# Patient Record
Sex: Female | Born: 1980 | Race: Black or African American | Hispanic: No | Marital: Single | Smoking: Never smoker
Health system: Southern US, Community
[De-identification: ages and names within clinical notes are randomized; demographics above are authoritative.]

## PROBLEM LIST (undated history)

## (undated) DIAGNOSIS — G500 Trigeminal neuralgia: Secondary | ICD-10-CM

## (undated) DIAGNOSIS — N186 End stage renal disease: Secondary | ICD-10-CM

## (undated) DIAGNOSIS — G629 Polyneuropathy, unspecified: Secondary | ICD-10-CM

## (undated) DIAGNOSIS — N289 Disorder of kidney and ureter, unspecified: Secondary | ICD-10-CM

## (undated) DIAGNOSIS — E1140 Type 2 diabetes mellitus with diabetic neuropathy, unspecified: Secondary | ICD-10-CM

## (undated) DIAGNOSIS — E11319 Type 2 diabetes mellitus with unspecified diabetic retinopathy without macular edema: Secondary | ICD-10-CM

## (undated) DIAGNOSIS — Z992 Dependence on renal dialysis: Secondary | ICD-10-CM

## (undated) DIAGNOSIS — D649 Anemia, unspecified: Secondary | ICD-10-CM

## (undated) DIAGNOSIS — E109 Type 1 diabetes mellitus without complications: Secondary | ICD-10-CM

## (undated) DIAGNOSIS — R519 Headache, unspecified: Secondary | ICD-10-CM

## (undated) DIAGNOSIS — K3184 Gastroparesis: Secondary | ICD-10-CM

## (undated) DIAGNOSIS — I10 Essential (primary) hypertension: Secondary | ICD-10-CM

## (undated) DIAGNOSIS — R51 Headache: Secondary | ICD-10-CM

## (undated) HISTORY — DX: Headache: R51

## (undated) HISTORY — PX: EXCISIONAL HEMORRHOIDECTOMY: SHX1541

## (undated) HISTORY — PX: DILATION AND CURETTAGE OF UTERUS: SHX78

## (undated) HISTORY — PX: EYE SURGERY: SHX253

## (undated) HISTORY — PX: RETINAL LASER PROCEDURE: SHX2339

## (undated) HISTORY — DX: Headache, unspecified: R51.9

---
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
## 2002-01-01 ENCOUNTER — Ambulatory Visit (HOSPITAL_COMMUNITY): Admission: RE | Admit: 2002-01-01 | Discharge: 2002-01-01 | Payer: Self-pay | Admitting: *Deleted

## 2002-01-09 ENCOUNTER — Encounter: Admission: RE | Admit: 2002-01-09 | Discharge: 2002-01-09 | Payer: Self-pay | Admitting: *Deleted

## 2002-01-29 ENCOUNTER — Encounter: Admission: RE | Admit: 2002-01-29 | Discharge: 2002-01-29 | Payer: Self-pay | Admitting: *Deleted

## 2002-01-29 ENCOUNTER — Inpatient Hospital Stay (HOSPITAL_COMMUNITY): Admission: AD | Admit: 2002-01-29 | Discharge: 2002-01-29 | Payer: Self-pay | Admitting: Obstetrics and Gynecology

## 2002-01-30 ENCOUNTER — Inpatient Hospital Stay (HOSPITAL_COMMUNITY): Admission: AD | Admit: 2002-01-30 | Discharge: 2002-01-30 | Payer: Self-pay | Admitting: *Deleted

## 2002-02-05 ENCOUNTER — Encounter: Admission: RE | Admit: 2002-02-05 | Discharge: 2002-02-05 | Payer: Self-pay | Admitting: *Deleted

## 2002-02-18 ENCOUNTER — Ambulatory Visit (HOSPITAL_COMMUNITY): Admission: RE | Admit: 2002-02-18 | Discharge: 2002-02-18 | Payer: Self-pay | Admitting: *Deleted

## 2002-02-20 ENCOUNTER — Encounter: Admission: RE | Admit: 2002-02-20 | Discharge: 2002-02-20 | Payer: Self-pay | Admitting: *Deleted

## 2002-03-06 ENCOUNTER — Encounter: Admission: RE | Admit: 2002-03-06 | Discharge: 2002-03-06 | Payer: Self-pay | Admitting: Obstetrics and Gynecology

## 2002-03-20 ENCOUNTER — Encounter: Admission: RE | Admit: 2002-03-20 | Discharge: 2002-03-20 | Payer: Self-pay | Admitting: *Deleted

## 2002-03-26 ENCOUNTER — Ambulatory Visit (HOSPITAL_COMMUNITY): Admission: RE | Admit: 2002-03-26 | Discharge: 2002-03-26 | Payer: Self-pay | Admitting: *Deleted

## 2002-04-02 ENCOUNTER — Inpatient Hospital Stay (HOSPITAL_COMMUNITY): Admission: AD | Admit: 2002-04-02 | Discharge: 2002-04-02 | Payer: Self-pay | Admitting: Obstetrics and Gynecology

## 2002-04-15 ENCOUNTER — Inpatient Hospital Stay (HOSPITAL_COMMUNITY): Admission: AD | Admit: 2002-04-15 | Discharge: 2002-04-18 | Payer: Self-pay | Admitting: Obstetrics and Gynecology

## 2002-05-27 ENCOUNTER — Other Ambulatory Visit: Admission: RE | Admit: 2002-05-27 | Discharge: 2002-05-27 | Payer: Self-pay | Admitting: Gynecology

## 2002-09-15 ENCOUNTER — Emergency Department (HOSPITAL_COMMUNITY): Admission: EM | Admit: 2002-09-15 | Discharge: 2002-09-15 | Payer: Self-pay | Admitting: Emergency Medicine

## 2004-01-09 ENCOUNTER — Emergency Department (HOSPITAL_COMMUNITY): Admission: EM | Admit: 2004-01-09 | Discharge: 2004-01-09 | Payer: Self-pay | Admitting: Emergency Medicine

## 2004-03-15 ENCOUNTER — Inpatient Hospital Stay (HOSPITAL_COMMUNITY): Admission: AD | Admit: 2004-03-15 | Discharge: 2004-03-15 | Payer: Self-pay | Admitting: *Deleted

## 2004-03-15 ENCOUNTER — Encounter (INDEPENDENT_AMBULATORY_CARE_PROVIDER_SITE_OTHER): Payer: Self-pay | Admitting: Specialist

## 2004-03-18 ENCOUNTER — Inpatient Hospital Stay (HOSPITAL_COMMUNITY): Admission: AD | Admit: 2004-03-18 | Discharge: 2004-03-18 | Payer: Self-pay | Admitting: Obstetrics and Gynecology

## 2004-09-10 ENCOUNTER — Encounter (INDEPENDENT_AMBULATORY_CARE_PROVIDER_SITE_OTHER): Payer: Self-pay | Admitting: Specialist

## 2004-09-10 ENCOUNTER — Inpatient Hospital Stay (HOSPITAL_COMMUNITY): Admission: AD | Admit: 2004-09-10 | Discharge: 2004-09-11 | Payer: Self-pay | Admitting: Obstetrics and Gynecology

## 2004-09-11 ENCOUNTER — Encounter (INDEPENDENT_AMBULATORY_CARE_PROVIDER_SITE_OTHER): Payer: Self-pay | Admitting: Specialist

## 2005-05-26 ENCOUNTER — Ambulatory Visit: Payer: Self-pay | Admitting: Internal Medicine

## 2005-05-29 ENCOUNTER — Ambulatory Visit: Payer: Self-pay | Admitting: Internal Medicine

## 2005-05-30 ENCOUNTER — Ambulatory Visit: Payer: Self-pay | Admitting: Internal Medicine

## 2005-06-05 ENCOUNTER — Ambulatory Visit: Payer: Self-pay | Admitting: Internal Medicine

## 2005-06-07 ENCOUNTER — Ambulatory Visit: Payer: Self-pay | Admitting: *Deleted

## 2005-06-14 ENCOUNTER — Ambulatory Visit: Payer: Self-pay | Admitting: Internal Medicine

## 2005-09-15 ENCOUNTER — Ambulatory Visit: Payer: Self-pay | Admitting: Internal Medicine

## 2005-09-18 ENCOUNTER — Ambulatory Visit: Payer: Self-pay | Admitting: Family Medicine

## 2006-02-23 ENCOUNTER — Ambulatory Visit: Payer: Self-pay | Admitting: Family Medicine

## 2006-02-27 ENCOUNTER — Ambulatory Visit: Payer: Self-pay | Admitting: Internal Medicine

## 2006-06-25 ENCOUNTER — Encounter (INDEPENDENT_AMBULATORY_CARE_PROVIDER_SITE_OTHER): Payer: Self-pay | Admitting: Internal Medicine

## 2006-06-25 ENCOUNTER — Ambulatory Visit: Payer: Self-pay | Admitting: Internal Medicine

## 2006-06-25 ENCOUNTER — Emergency Department (HOSPITAL_COMMUNITY): Admission: EM | Admit: 2006-06-25 | Discharge: 2006-06-26 | Payer: Self-pay | Admitting: Emergency Medicine

## 2006-06-25 LAB — CONVERTED CEMR LAB

## 2006-06-27 ENCOUNTER — Emergency Department (HOSPITAL_COMMUNITY): Admission: EM | Admit: 2006-06-27 | Discharge: 2006-06-27 | Payer: Self-pay | Admitting: Emergency Medicine

## 2006-07-10 ENCOUNTER — Ambulatory Visit: Payer: Self-pay | Admitting: Internal Medicine

## 2006-07-12 ENCOUNTER — Ambulatory Visit: Payer: Self-pay | Admitting: Internal Medicine

## 2006-08-02 ENCOUNTER — Ambulatory Visit: Payer: Self-pay | Admitting: Internal Medicine

## 2006-09-01 ENCOUNTER — Encounter (INDEPENDENT_AMBULATORY_CARE_PROVIDER_SITE_OTHER): Payer: Self-pay | Admitting: Internal Medicine

## 2006-09-01 DIAGNOSIS — L0390 Cellulitis, unspecified: Secondary | ICD-10-CM

## 2006-09-01 DIAGNOSIS — Z9119 Patient's noncompliance with other medical treatment and regimen: Secondary | ICD-10-CM | POA: Insufficient documentation

## 2006-09-01 DIAGNOSIS — N739 Female pelvic inflammatory disease, unspecified: Secondary | ICD-10-CM | POA: Insufficient documentation

## 2006-09-01 DIAGNOSIS — L0291 Cutaneous abscess, unspecified: Secondary | ICD-10-CM

## 2006-09-01 DIAGNOSIS — E785 Hyperlipidemia, unspecified: Secondary | ICD-10-CM | POA: Insufficient documentation

## 2006-09-01 DIAGNOSIS — O039 Complete or unspecified spontaneous abortion without complication: Secondary | ICD-10-CM | POA: Insufficient documentation

## 2006-09-01 DIAGNOSIS — Z91199 Patient's noncompliance with other medical treatment and regimen due to unspecified reason: Secondary | ICD-10-CM | POA: Insufficient documentation

## 2007-01-02 ENCOUNTER — Encounter (INDEPENDENT_AMBULATORY_CARE_PROVIDER_SITE_OTHER): Payer: Self-pay | Admitting: *Deleted

## 2007-03-19 ENCOUNTER — Telehealth (INDEPENDENT_AMBULATORY_CARE_PROVIDER_SITE_OTHER): Payer: Self-pay | Admitting: Internal Medicine

## 2007-03-22 ENCOUNTER — Telehealth (INDEPENDENT_AMBULATORY_CARE_PROVIDER_SITE_OTHER): Payer: Self-pay | Admitting: Internal Medicine

## 2007-03-28 ENCOUNTER — Ambulatory Visit: Payer: Self-pay | Admitting: Internal Medicine

## 2007-03-28 DIAGNOSIS — K089 Disorder of teeth and supporting structures, unspecified: Secondary | ICD-10-CM | POA: Insufficient documentation

## 2007-03-28 LAB — CONVERTED CEMR LAB
Blood Glucose, Fingerstick: 361
Hgb A1c MFr Bld: >14 %

## 2007-04-02 ENCOUNTER — Encounter (INDEPENDENT_AMBULATORY_CARE_PROVIDER_SITE_OTHER): Payer: Self-pay | Admitting: Internal Medicine

## 2007-04-04 ENCOUNTER — Encounter (INDEPENDENT_AMBULATORY_CARE_PROVIDER_SITE_OTHER): Payer: Self-pay | Admitting: Internal Medicine

## 2007-07-19 ENCOUNTER — Ambulatory Visit: Payer: Self-pay | Admitting: Internal Medicine

## 2007-07-19 DIAGNOSIS — J309 Allergic rhinitis, unspecified: Secondary | ICD-10-CM | POA: Insufficient documentation

## 2007-07-19 LAB — CONVERTED CEMR LAB
Blood Glucose, Fingerstick: 332
Hgb A1c MFr Bld: >14 %

## 2007-09-05 ENCOUNTER — Ambulatory Visit: Payer: Self-pay | Admitting: Nurse Practitioner

## 2007-09-05 DIAGNOSIS — N949 Unspecified condition associated with female genital organs and menstrual cycle: Secondary | ICD-10-CM

## 2007-09-05 DIAGNOSIS — N3090 Cystitis, unspecified without hematuria: Secondary | ICD-10-CM | POA: Insufficient documentation

## 2007-09-05 LAB — CONVERTED CEMR LAB
Beta hcg, urine, semiquantitative: NEGATIVE
Bilirubin Urine: NEGATIVE
Blood Glucose, Fingerstick: 272
Glucose, Urine, Semiquant: >=1000
Hgb A1c MFr Bld: 13.1 %
Ketones, urine, test strip: NEGATIVE
Nitrite: NEGATIVE
Protein, U semiquant: 100
Specific Gravity, Urine: 1.01
Urobilinogen, UA: 0.2
pH: 6

## 2007-09-10 LAB — CONVERTED CEMR LAB
Chlamydia, Swab/Urine, PCR: NEGATIVE
GC Probe Amp, Urine: NEGATIVE

## 2007-09-17 ENCOUNTER — Telehealth (INDEPENDENT_AMBULATORY_CARE_PROVIDER_SITE_OTHER): Payer: Self-pay | Admitting: Internal Medicine

## 2007-09-24 ENCOUNTER — Encounter (INDEPENDENT_AMBULATORY_CARE_PROVIDER_SITE_OTHER): Payer: Self-pay | Admitting: *Deleted

## 2007-10-09 ENCOUNTER — Ambulatory Visit: Payer: Self-pay | Admitting: Internal Medicine

## 2007-10-11 ENCOUNTER — Ambulatory Visit: Payer: Self-pay | Admitting: Internal Medicine

## 2007-10-17 ENCOUNTER — Ambulatory Visit: Payer: Self-pay | Admitting: Internal Medicine

## 2007-10-17 DIAGNOSIS — R300 Dysuria: Secondary | ICD-10-CM | POA: Insufficient documentation

## 2007-10-17 LAB — CONVERTED CEMR LAB
Bilirubin Urine: NEGATIVE
Glucose, Urine, Semiquant: >=1000
Nitrite: NEGATIVE
Protein, U semiquant: 30
Specific Gravity, Urine: 1.01
Urobilinogen, UA: 0.2
pH: 6

## 2007-10-18 ENCOUNTER — Encounter (INDEPENDENT_AMBULATORY_CARE_PROVIDER_SITE_OTHER): Payer: Self-pay | Admitting: Internal Medicine

## 2007-11-09 ENCOUNTER — Emergency Department (HOSPITAL_COMMUNITY): Admission: EM | Admit: 2007-11-09 | Discharge: 2007-11-09 | Payer: Self-pay | Admitting: Emergency Medicine

## 2007-11-11 ENCOUNTER — Telehealth (INDEPENDENT_AMBULATORY_CARE_PROVIDER_SITE_OTHER): Payer: Self-pay | Admitting: *Deleted

## 2007-11-14 ENCOUNTER — Ambulatory Visit: Payer: Self-pay | Admitting: Internal Medicine

## 2007-11-14 DIAGNOSIS — B373 Candidiasis of vulva and vagina: Secondary | ICD-10-CM

## 2007-11-14 DIAGNOSIS — R109 Unspecified abdominal pain: Secondary | ICD-10-CM | POA: Insufficient documentation

## 2007-11-14 LAB — CONVERTED CEMR LAB
Bilirubin Urine: NEGATIVE
Blood Glucose, Fingerstick: 212
Chlamydia, DNA Probe: NEGATIVE
GC Probe Amp, Genital: NEGATIVE
Glucose, Urine, Semiquant: >=1000
Nitrite: POSITIVE
Protein, U semiquant: >=300
Specific Gravity, Urine: 1.025
Urobilinogen, UA: 0.2
Whiff Test: POSITIVE
pH: 6.5

## 2007-11-15 ENCOUNTER — Encounter (INDEPENDENT_AMBULATORY_CARE_PROVIDER_SITE_OTHER): Payer: Self-pay | Admitting: Nurse Practitioner

## 2007-11-18 ENCOUNTER — Telehealth (INDEPENDENT_AMBULATORY_CARE_PROVIDER_SITE_OTHER): Payer: Self-pay | Admitting: Internal Medicine

## 2007-11-26 ENCOUNTER — Telehealth (INDEPENDENT_AMBULATORY_CARE_PROVIDER_SITE_OTHER): Payer: Self-pay | Admitting: *Deleted

## 2007-12-02 ENCOUNTER — Encounter: Admission: RE | Admit: 2007-12-02 | Discharge: 2008-01-15 | Payer: Self-pay | Admitting: Internal Medicine

## 2007-12-03 ENCOUNTER — Encounter (INDEPENDENT_AMBULATORY_CARE_PROVIDER_SITE_OTHER): Payer: Self-pay | Admitting: Internal Medicine

## 2007-12-11 ENCOUNTER — Telehealth (INDEPENDENT_AMBULATORY_CARE_PROVIDER_SITE_OTHER): Payer: Self-pay | Admitting: Internal Medicine

## 2007-12-27 ENCOUNTER — Ambulatory Visit: Payer: Self-pay | Admitting: Internal Medicine

## 2007-12-27 LAB — CONVERTED CEMR LAB
Blood Glucose, Fingerstick: 201
Hgb A1c MFr Bld: 9.9 %

## 2007-12-29 ENCOUNTER — Ambulatory Visit (HOSPITAL_COMMUNITY): Admission: RE | Admit: 2007-12-29 | Discharge: 2007-12-29 | Payer: Self-pay | Admitting: Internal Medicine

## 2008-01-07 ENCOUNTER — Telehealth (INDEPENDENT_AMBULATORY_CARE_PROVIDER_SITE_OTHER): Payer: Self-pay | Admitting: Internal Medicine

## 2008-02-08 ENCOUNTER — Ambulatory Visit (HOSPITAL_COMMUNITY): Admission: RE | Admit: 2008-02-08 | Discharge: 2008-02-08 | Payer: Self-pay | Admitting: Internal Medicine

## 2008-02-11 ENCOUNTER — Telehealth (INDEPENDENT_AMBULATORY_CARE_PROVIDER_SITE_OTHER): Payer: Self-pay | Admitting: Internal Medicine

## 2008-02-20 ENCOUNTER — Encounter (INDEPENDENT_AMBULATORY_CARE_PROVIDER_SITE_OTHER): Payer: Self-pay | Admitting: Internal Medicine

## 2008-02-21 ENCOUNTER — Encounter (INDEPENDENT_AMBULATORY_CARE_PROVIDER_SITE_OTHER): Payer: Self-pay | Admitting: Internal Medicine

## 2008-03-10 ENCOUNTER — Telehealth (INDEPENDENT_AMBULATORY_CARE_PROVIDER_SITE_OTHER): Payer: Self-pay | Admitting: Internal Medicine

## 2008-04-02 ENCOUNTER — Ambulatory Visit: Payer: Self-pay | Admitting: Internal Medicine

## 2008-04-02 LAB — CONVERTED CEMR LAB
Blood Glucose, Fingerstick: 190
Hgb A1c MFr Bld: 10.2 %

## 2008-04-13 ENCOUNTER — Encounter (INDEPENDENT_AMBULATORY_CARE_PROVIDER_SITE_OTHER): Payer: Self-pay | Admitting: Internal Medicine

## 2008-04-13 ENCOUNTER — Ambulatory Visit: Payer: Self-pay | Admitting: Nurse Practitioner

## 2008-04-14 ENCOUNTER — Ambulatory Visit: Payer: Self-pay | Admitting: Internal Medicine

## 2008-05-14 LAB — CONVERTED CEMR LAB
ALT: 9 units/L (ref 0–35)
AST: 10 units/L (ref 0–37)
Albumin: 4 g/dL (ref 3.5–5.2)
Alkaline Phosphatase: 63 units/L (ref 39–117)
BUN: 13 mg/dL (ref 6–23)
Basophils Absolute: 0.1 10*3/uL (ref 0.0–0.1)
Basophils Relative: 1 % (ref 0–1)
CO2: 20 meq/L (ref 19–32)
Calcium: 9.2 mg/dL (ref 8.4–10.5)
Chloride: 103 meq/L (ref 96–112)
Cholesterol: 221 mg/dL — ABNORMAL HIGH (ref 0–200)
Creatinine, Ser: 0.64 mg/dL (ref 0.40–1.20)
Eosinophils Absolute: 0.1 10*3/uL (ref 0.0–0.7)
Eosinophils Relative: 2 % (ref 0–5)
Glucose, Bld: 303 mg/dL — ABNORMAL HIGH (ref 70–99)
HCT: 46.6 % — ABNORMAL HIGH (ref 36.0–46.0)
HDL: 76 mg/dL (ref >39)
Hemoglobin: 15.6 g/dL — ABNORMAL HIGH (ref 12.0–15.0)
Hgb A1c MFr Bld: 10.5 % — ABNORMAL HIGH (ref 4.6–6.1)
LDL Cholesterol: 127 mg/dL — ABNORMAL HIGH (ref 0–99)
Lymphocytes Relative: 33 % (ref 12–46)
Lymphs Abs: 1.9 10*3/uL (ref 0.7–4.0)
MCHC: 33.5 g/dL (ref 30.0–36.0)
MCV: 91.2 fL (ref 78.0–100.0)
Microalb, Ur: 22.8 mg/dL — ABNORMAL HIGH (ref 0.00–1.89)
Monocytes Absolute: 0.3 10*3/uL (ref 0.1–1.0)
Monocytes Relative: 5 % (ref 3–12)
Neutro Abs: 3.4 10*3/uL (ref 1.7–7.7)
Neutrophils Relative %: 60 % (ref 43–77)
Platelets: 313 10*3/uL (ref 150–400)
Potassium: 4.1 meq/L (ref 3.5–5.3)
RBC: 5.11 M/uL (ref 3.87–5.11)
RDW: 12.6 % (ref 11.5–15.5)
Sodium: 139 meq/L (ref 135–145)
Total Bilirubin: 0.9 mg/dL (ref 0.3–1.2)
Total CHOL/HDL Ratio: 2.9
Total Protein: 7.6 g/dL (ref 6.0–8.3)
Triglycerides: 90 mg/dL (ref <150)
VLDL: 18 mg/dL (ref 0–40)
WBC: 5.8 10*3/uL (ref 4.0–10.5)

## 2008-06-12 ENCOUNTER — Ambulatory Visit: Payer: Self-pay | Admitting: Internal Medicine

## 2008-06-12 DIAGNOSIS — E7800 Pure hypercholesterolemia, unspecified: Secondary | ICD-10-CM

## 2008-06-12 DIAGNOSIS — R809 Proteinuria, unspecified: Secondary | ICD-10-CM | POA: Insufficient documentation

## 2008-06-12 LAB — CONVERTED CEMR LAB: Blood Glucose, Fingerstick: 137

## 2008-07-14 ENCOUNTER — Encounter (INDEPENDENT_AMBULATORY_CARE_PROVIDER_SITE_OTHER): Payer: Self-pay | Admitting: Internal Medicine

## 2008-09-17 ENCOUNTER — Encounter (INDEPENDENT_AMBULATORY_CARE_PROVIDER_SITE_OTHER): Payer: Self-pay | Admitting: Internal Medicine

## 2008-12-29 ENCOUNTER — Ambulatory Visit: Payer: Self-pay | Admitting: Nurse Practitioner

## 2008-12-29 DIAGNOSIS — L0292 Furuncle, unspecified: Secondary | ICD-10-CM | POA: Insufficient documentation

## 2008-12-29 DIAGNOSIS — L0293 Carbuncle, unspecified: Secondary | ICD-10-CM

## 2008-12-29 LAB — CONVERTED CEMR LAB
Blood Glucose, Fingerstick: 290
Hgb A1c MFr Bld: 13.9 %

## 2009-01-28 ENCOUNTER — Ambulatory Visit: Payer: Self-pay | Admitting: Internal Medicine

## 2009-01-28 DIAGNOSIS — N898 Other specified noninflammatory disorders of vagina: Secondary | ICD-10-CM | POA: Insufficient documentation

## 2009-01-28 LAB — CONVERTED CEMR LAB
Beta hcg, urine, semiquantitative: NEGATIVE
Blood Glucose, Fingerstick: 319
KOH Prep: NEGATIVE
Whiff Test: NEGATIVE

## 2009-02-08 ENCOUNTER — Telehealth (INDEPENDENT_AMBULATORY_CARE_PROVIDER_SITE_OTHER): Payer: Self-pay | Admitting: Internal Medicine

## 2009-02-22 ENCOUNTER — Ambulatory Visit: Payer: Self-pay | Admitting: Internal Medicine

## 2009-02-22 LAB — CONVERTED CEMR LAB
BUN: 14 mg/dL (ref 6–23)
CO2: 20 meq/L (ref 19–32)
Calcium: 9.6 mg/dL (ref 8.4–10.5)
Chloride: 102 meq/L (ref 96–112)
Creatinine, Ser: 0.7 mg/dL (ref 0.40–1.20)
Glucose, Bld: 347 mg/dL — ABNORMAL HIGH (ref 70–99)
Potassium: 3.9 meq/L (ref 3.5–5.3)
Sodium: 137 meq/L (ref 135–145)

## 2009-03-22 ENCOUNTER — Encounter (INDEPENDENT_AMBULATORY_CARE_PROVIDER_SITE_OTHER): Payer: Self-pay | Admitting: Internal Medicine

## 2009-05-11 ENCOUNTER — Ambulatory Visit: Payer: Self-pay | Admitting: Physician Assistant

## 2009-05-11 ENCOUNTER — Encounter (INDEPENDENT_AMBULATORY_CARE_PROVIDER_SITE_OTHER): Payer: Self-pay | Admitting: Internal Medicine

## 2009-05-11 LAB — CONVERTED CEMR LAB: Sed Rate: 5 mm/hr (ref 0–22)

## 2009-05-20 ENCOUNTER — Encounter (INDEPENDENT_AMBULATORY_CARE_PROVIDER_SITE_OTHER): Payer: Self-pay | Admitting: Internal Medicine

## 2009-05-25 ENCOUNTER — Ambulatory Visit: Payer: Self-pay | Admitting: Internal Medicine

## 2009-05-25 ENCOUNTER — Telehealth (INDEPENDENT_AMBULATORY_CARE_PROVIDER_SITE_OTHER): Payer: Self-pay | Admitting: Internal Medicine

## 2009-05-25 DIAGNOSIS — R5383 Other fatigue: Secondary | ICD-10-CM | POA: Insufficient documentation

## 2009-05-25 DIAGNOSIS — L708 Other acne: Secondary | ICD-10-CM | POA: Insufficient documentation

## 2009-05-25 DIAGNOSIS — R5381 Other malaise: Secondary | ICD-10-CM

## 2009-05-25 LAB — CONVERTED CEMR LAB
ALT: 10 units/L (ref 0–35)
AST: 11 units/L (ref 0–37)
Alkaline Phosphatase: 62 units/L (ref 39–117)
BUN: 14 mg/dL (ref 6–23)
Basophils Absolute: 0.1 10*3/uL (ref 0.0–0.1)
Bilirubin Urine: NEGATIVE
Chloride: 100 meq/L (ref 96–112)
Creatinine, Ser: 0.76 mg/dL (ref 0.40–1.20)
Eosinophils Absolute: 0.1 10*3/uL (ref 0.0–0.7)
Eosinophils Relative: 1 % (ref 0–5)
GC Probe Amp, Genital: NEGATIVE
Glucose, Urine, Semiquant: >=1000
HCT: 48.2 % — ABNORMAL HIGH (ref 36.0–46.0)
HDL: 95 mg/dL (ref >39)
Hgb A1c MFr Bld: 12.4 % — ABNORMAL HIGH (ref 4.6–6.1)
LDL Cholesterol: 87 mg/dL (ref 0–99)
Lymphs Abs: 1.7 10*3/uL (ref 0.7–4.0)
MCV: 91.8 fL (ref 78.0–100.0)
Neutrophils Relative %: 61 % (ref 43–77)
Pap Smear: NEGATIVE
Platelets: 312 10*3/uL (ref 150–400)
RDW: 12.7 % (ref 11.5–15.5)
Specific Gravity, Urine: 1.02
TSH: 1.19 microintl units/mL (ref 0.350–4.500)
Total Bilirubin: 1.2 mg/dL (ref 0.3–1.2)
Total CHOL/HDL Ratio: 2
VLDL: 11 mg/dL (ref 0–40)
WBC: 5.4 10*3/uL (ref 4.0–10.5)
pH: 5.5

## 2009-06-08 ENCOUNTER — Telehealth: Payer: Self-pay | Admitting: Physician Assistant

## 2009-06-19 ENCOUNTER — Encounter (INDEPENDENT_AMBULATORY_CARE_PROVIDER_SITE_OTHER): Payer: Self-pay | Admitting: Internal Medicine

## 2009-07-25 ENCOUNTER — Encounter (INDEPENDENT_AMBULATORY_CARE_PROVIDER_SITE_OTHER): Payer: Self-pay | Admitting: Internal Medicine

## 2009-07-28 ENCOUNTER — Telehealth (INDEPENDENT_AMBULATORY_CARE_PROVIDER_SITE_OTHER): Payer: Self-pay | Admitting: *Deleted

## 2009-08-24 ENCOUNTER — Ambulatory Visit: Payer: Self-pay | Admitting: Internal Medicine

## 2009-11-19 ENCOUNTER — Ambulatory Visit: Payer: Self-pay | Admitting: Internal Medicine

## 2010-03-17 ENCOUNTER — Encounter (INDEPENDENT_AMBULATORY_CARE_PROVIDER_SITE_OTHER): Payer: Self-pay | Admitting: Internal Medicine

## 2010-03-22 ENCOUNTER — Telehealth (INDEPENDENT_AMBULATORY_CARE_PROVIDER_SITE_OTHER): Payer: Self-pay | Admitting: Internal Medicine

## 2010-03-22 ENCOUNTER — Ambulatory Visit: Payer: Self-pay | Admitting: Internal Medicine

## 2010-03-22 DIAGNOSIS — J029 Acute pharyngitis, unspecified: Secondary | ICD-10-CM | POA: Insufficient documentation

## 2010-05-02 ENCOUNTER — Emergency Department (HOSPITAL_COMMUNITY)
Admission: EM | Admit: 2010-05-02 | Discharge: 2010-05-02 | Payer: Self-pay | Source: Home / Self Care | Admitting: Emergency Medicine

## 2010-05-04 LAB — GLUCOSE, CAPILLARY: Glucose-Capillary: 291 mg/dL — ABNORMAL HIGH (ref 70–99)

## 2010-05-17 ENCOUNTER — Encounter (INDEPENDENT_AMBULATORY_CARE_PROVIDER_SITE_OTHER): Payer: Self-pay | Admitting: Internal Medicine

## 2010-05-17 NOTE — Letter (Signed)
Summary: REQUESTING IMMUNIZATION RECORDS FOR SELF  REQUESTING IMMUNIZATION RECORDS FOR SELF   Imported By: Roland Earl 07/12/2009 12:25:37  _____________________________________________________________________  External Attachment:    Type:   Image     Comment:   External Document

## 2010-05-17 NOTE — Assessment & Plan Note (Signed)
Summary: FOLLOW UP WITH DR Bee Hammerschmidt IN 3 MONTHS//GK   Vital Signs:  Patient profile:   30 year old female Menstrual status:  regular Weight:      156 pounds Temp:     98.1 degrees F Pulse rate:   91 / minute Pulse rhythm:   regular Resp:     18 per minute BP sitting:   116 / 74  (left arm) Cuff size:   regular  Vitals Entered By: Shellia Carwin CMA (Aug 24, 2009 8:49 AM) CC: 3 month f/u dm, left hip pain Is Patient Diabetic? Yes Pain Assessment Patient in pain? yes     Location: lt hip Intensity: 5 CBG Result 174  Does patient need assistance? Ambulation Normal   Primary Care Provider:  Mack Hook MD  CC:  3 month f/u dm and left hip pain.  History of Present Illness: 1.  Hip pain:  Mainly on left now.  Pt. states planning for arthroscopic surgery--no date yet at [facility].  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  Did not get pain medication--Diclofenac.  "Just dealing with the discomfort".  Using otc sleep medications.  2.  DM:  Not checking sugars at all.  Needs a new meter.  States taking insulin two times a day.  Not taking Lisinopril or Lipitor.  Has been off one or both for past 2 weeks.  Taking Novolog mix 28 units in a.m. and 30 units in p.m.  States she is doing really well remembering to take insulin.  No low blood sugars.  States she has an up to date glucagon pen as well as glucose gel and tablets if needed.  Had eyes checked with optometry at [facility] 3 weeks ago--states no diabetic changes.  Takes care of feet.  Very concerned about taking meds--problems with addiction in her family.    3.  Yeast vaginitis:  states has cleared.  Allergies (verified): No Known Drug Allergies  Physical Exam  General:  NAD Lungs:  Normal respiratory effort, chest expands symmetrically. Lungs are clear to auscultation, no crackles or wheezes. Heart:  Normal rate and regular rhythm. S1 and S2 normal without  gallop, murmur, click, rub or other extra sounds.  Radial pulses normal and equal.   Impression & Recommendations:  Problem # 1:  DM, UNCOMPLICATED, TYPE I, UNCONTROLLED (ICD-250.03)  A1C >14% Pt. admits to missing insulin --often forgets morning dose--will put an alarm on her cell phone to remind her and begin documenting sugars and insulin dosing in notebook. Her updated medication list for this problem includes:    Novolog Mix 70/30 Flexpen 70-30 % Susp (Insulin aspart prot & aspart) .Marland Kitchen... 28 units subcutaneously in a.m. and 30 units subcutaneously in p.m. 15 minutes before meals    Glucagen Diagnostic Kit 1 Mg Solr (Glucagon hcl rdna (diagnostic)) ..... Use as directed for low blood sugar and unable to take by mouth    Lisinopril 2.5 Mg Tabs (Lisinopril) .Marland Kitchen... 1 tab by mouth daily  Orders: Hgb A1C [id number])  Problem # 2:  CONTRACEPTIVE MANAGEMENT (ICD-V25.09) Depo today Orders: Urine Pregnancy Test  [id number]) Admin of Therapeutic Inj  intramuscular or subcutaneous [id number]) Depo-Provera 150mg  (J1055)  Problem # 3:  MICROALBUMINURIA (ICD-791.0) Encouraged taking meds again.  Problem # 4:  MONILIAL VAGINITIS (ICD-112.1) Surprisingly resolved despite poor sugar control Her updated medication list for this problem includes:    Fluconazole 150 Mg Tabs (Fluconazole) .Marland Kitchen... 1 tab by mouth daily  for 5 days  Complete Medication List: 1)  Novolog Mix 70/30 Flexpen 70-30 % Susp (Insulin aspart prot & aspart) .... 28 units subcutaneously in a.m. and 30 units subcutaneously in p.m. 15 minutes before meals 2)  Glucagen Diagnostic Kit 1 Mg Solr (Glucagon hcl rdna (diagnostic)) .... Use as directed for low blood sugar and unable to take by mouth 3)  Fexofenadine Hcl 180 Mg Tabs (Fexofenadine hcl) .Marland Kitchen.. 1 tab by mouth daily as needed allergies 4)  Insulin Needles  .... For two times a day injections with novolog flexpen 5)  Depo-provera 150 Mg/ml Susp (Medroxyprogesterone acetate) .Marland KitchenMarland KitchenMarland Kitchen 150 mg im  today and every 3 months.  if pt. does not keep current, cannot have refill of lipitor or lisinopril 6)  Lisinopril 2.5 Mg Tabs (Lisinopril) .Marland Kitchen.. 1 tab by mouth daily 7)  Lipitor 10 Mg Tabs (Atorvastatin calcium) .Marland Kitchen.. 1 tab by mouth daily 8)  Fluconazole 150 Mg Tabs (Fluconazole) .Marland Kitchen.. 1 tab by mouth daily for 5 days 9)  Feldene 20 Mg Caps (Piroxicam) .Marland Kitchen.. 1 cap by mouth at bedtime as needed hip pain--ortho 10)  Verlene Mayer W/device Kit (Blood glucose monitoring suppl) .... Two times a day glucose checks 11)  Wavesense Presto Test Strp (Glucose blood) .... Two times a day sugar checks 12)  Lancets Misc (Lancets) .... Two times a day sugar checks  Other Orders: Capillary Blood Glucose/CBG [id number])  Patient Instructions: 1)  Follow up with Dr. Amil Amen in 2 months --DM 2)  Follow up with Nurse visit in 3 months for Depo provera 3)  Obtain a notebook and keep track of sugars two times a day  Prescriptions: LANCETS  MISC (LANCETS) two times a day sugar checks  #60 x 11   Entered and Authorized by:   Mack Hook MD   Signed by:   Mack Hook MD on 08/24/2009   Method used:   Faxed to ...       [facility] (retail)       [place], [place]  [address]       Ph: [phone] [place]       Fax: [phone]   RxID:   [id number] WAVESENSE PRESTO TEST  STRP (GLUCOSE BLOOD) two times a day sugar checks  #60 x 11   Entered and Authorized by:   Mack Hook MD   Signed by:   Mack Hook MD on 08/24/2009   Method used:   Faxed to ...       [facility] (retail)       [place], [place]  [address]       Ph: [phone] [place]       Fax: [phone]   RxID:   [id number] WAVESENSE PRESTO W/DEVICE KIT (BLOOD GLUCOSE MONITORING SUPPL) two times a day glucose checks  #1 x 0   Entered and Authorized by:   Mack Hook MD   Signed by:   Mack Hook  MD on 08/24/2009   Method used:   Faxed to ...       [facility] (retail)       [place], [place]  [address]       Ph: [phone]       Fax: [phone]   RxID:   [id number]    Medication Administration  Injection # 1:  Medication: Depo-Provera 150mg     Diagnosis: CONTRACEPTIVE MANAGEMENT (ICD-V25.09)    Route: IM    Site: R deltoid    Exp Date: 09/15/2011    Lot #: [id number]    Mfr: Pharmacia    Comments: [phone] Next shot due 11/15/09    Patient tolerated injection without complications    Given by: Shellia Carwin CMA (Aug 24, 2009 9:57 AM)  Orders Added: 1)  Capillary Blood Glucose/CBG [82948] 2)  Est. Patient Level III [id number] 3)  Urine Pregnancy Test  [81025] 4)  Admin of Therapeutic Inj  intramuscular or subcutaneous [96372] 5)  Depo-Provera 150mg  [J1055] 6)  Hgb A1C [83036QW]   Appended Document: FOLLOW UP WITH DR Pattye Meda IN 3 MONTHS//GK  Laboratory Results   Blood Tests     HGBA1C: >14%   (Normal Range: Non-Diabetic - 3-6%   Control Diabetic - 6-8%)

## 2010-05-17 NOTE — Letter (Signed)
Summary: DISCHARGE LETTER MAILED 03/17/10  DISCHARGE LETTER MAILED 03/17/10   Imported By: Adela Lank Stanislawscyk 03/17/2010 14:39:49  _____________________________________________________________________  External Attachment:    Type:   Image     Comment:   External Document

## 2010-05-17 NOTE — Assessment & Plan Note (Signed)
Summary: cpp////kt   Vital Signs:  Patient profile:   30 year old female Menstrual status:  regular Weight:      149.7 pounds Temp:     97.9 degrees F Pulse rate:   74 / minute Pulse rhythm:   regular Resp:     18 per minute BP sitting:   103 / 69  (right arm) Cuff size:   regular  Vitals Entered By: Shellia Carwin CMA (May 25, 2009 9:05 AM) CC: CPP Is Patient Diabetic? Yes Pain Assessment Patient in pain? no      CBG Result 329  Does patient need assistance? Ambulation Normal   Primary Care Provider:  Mack Hook MD  CC:  CPP.  History of Present Illness: 30 yo female here for CPP.  Concerns:  1.  Left inguinal pain:  previously was in right--pt. now states switches back and forth.  Have been unable to find a cause and pt. has missed work in past secondary to flare of pain.  Right is inguinal area is fine now.  Pt. in past helped minimally.  MRI of pelvis and hips as well as Xrays have been unremarkable.  ESR recently well within normal limits.  Pt. has been rereferred to [facility].  2. DM: pt. is notoriously noncompliant.  Urine noted to be quite frothy today with increased protein.  Pt. states she is compliant with Novolog--using 25 units two times a day since last seen.  Was not using regularly before.  Pt. describes being in denial--just doesn't like to think about it.  Not taking Lipitor or Lisinopril for some time.  Habits & Providers  Alcohol-Tobacco-Diet     Alcohol drinks/day: 0     Tobacco Status: never  Exercise-Depression-Behavior     Drug Use: never  Allergies: No Known Drug Allergies  Past History:  Past Medical History: CONTRACEPTIVE MANAGEMENT (ICD-V25.09) VAGINAL DISCHARGE (ICD-623.5) CONTRACEPTIVE MANAGEMENT (ICD-V25.09) CARBUNCLE AND FURUNCLE OF UNSPECIFIED SITE (ICD-680.9) MICROALBUMINURIA (ICD-791.0) HYPERCHOLESTEROLEMIA (ICD-272.0) INGUINAL PAIN, RIGHT (ICD-789.09) MONILIAL VAGINITIS (ICD-112.1) BACTERIAL VAGINOSIS  (ICD-616.10) DYSURIA (ICD-788.1) SCREENING EXAMINATION FOR PULMONARY TUBERCULOSIS (ICD-V74.1) SEXUAL ACTIVITY, HIGH RISK (ICD-V69.2) DELAYED MENSES (ICD-626.8) CYSTITIS (ICD-595.9) RHINOCONJUNCTIVITIS, 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 Lives with father and daughter. Single No significant other, not currently sexually activeDrug Use:  never  Review of Systems General:  Energy:  gets tired easily. Eyes:  Last eye check--Retasure 12/09.  Planning to go to [facility] this weekend for an eye check--will have them send a report.. ENT:  Denies decreased hearing. CV:  Denies chest pain or discomfort and palpitations. Resp:  Denies shortness of breath. GI:  Denies abdominal pain, bloody stools, constipation, dark tarry stools, and diarrhea. GU:  Complains of urinary frequency; Vaginal discharge--chronic.  White, somtimes with and odor.  Is pruritic.  Using Vagisil.  Last intercourse was Thanksgiving.  Occasional dysuria--outside.. MS:  Denies joint pain, joint redness, and joint swelling; See HPI for groin pain/hip pain. Derm:  Denies lesion(s) and rash; Lot of acne and eczema--leaving spots on face and back.  . Neuro:  Numbness in feet and hands when awakens--resolves quickly.Marland Kitchen Psych:  Denies anxiety, depression, and suicidal thoughts/plans; States  was Saved and feeling very good about herself currently.Marland Kitchen  Physical Exam  General:  Well-developed,well-nourished,in no acute distress;  alert,appropriate and cooperative throughout examination Head:  Normocephalic and atraumatic without obvious abnormalities. No apparent alopecia or balding. Eyes:  No corneal or conjunctival inflammation noted. EOMI. Perrla. Funduscopic exam benign, without hemorrhages, exudates or papilledema. Vision grossly normal. Ears:  External ear exam shows no significant lesions or deformities.  Otoscopic examination reveals clear canals, tympanic membranes are intact bilaterally without bulging, retraction, inflammation or discharge. Hearing is grossly normal bilaterally. Nose:  External nasal examination shows no deformity or inflammation. Nasal mucosa are pink and moist without lesions or exudates. Mouth:  Oral mucosa and oropharynx without lesions or exudates.  Teeth in good repair. Neck:  No deformities, masses, or tenderness noted. Chest Wall:  No deformities, masses, or tenderness noted. Breasts:  No mass, nodules, thickening, tenderness, bulging, retraction, inflamation, nipple discharge or skin changes noted.   Lungs:  Normal respiratory effort, chest expands symmetrically. Lungs are clear to auscultation, no crackles or wheezes. Heart:  Normal rate and regular rhythm. S1 and S2 normal without gallop, murmur, click, rub or other extra sounds. Abdomen:  Bowel sounds positive,abdomen soft and non-tender without masses, organomegaly or hernias noted. Rectal:  deferred Genitalia:  Pelvic Exam:        External: chronically inflammed appearing female genitalia with white thick discharge        Vagina: chronically swollen and inflammed with thick white yellow discharge        Cervix: inflammed without lesions or masses-high and posterior        Adnexa: normal bimanual exam without masses or fullness        Uterus: normal by palpation        Pap smear: performed Msk:  No deformity or scoliosis noted of thoracic or lumbar spine.   Pulses:  R and L carotid,radial,femoral,dorsalis pedis and posterior  tibial pulses are full and equal bilaterally Extremities:  No clubbing, cyanosis, edema, or deformity noted with normal full range of motion of all joints.   Neurologic:  No cranial nerve deficits noted. Station and gait are normal. Plantar reflexes are down-going bilaterally. DTRs are symmetrical throughout. Sensory, motor and coordinative functions appear intact. Skin:  acne scarring all over face and upper back  Darkened area s on dorsum of hands from eczema and below umbilicus. Cervical Nodes:  No lymphadenopathy noted Axillary Nodes:  No palpable lymphadenopathy Inguinal Nodes:  No significant adenopathy Psych:  Cognition and judgment appear intact. Alert and cooperative with normal attention span and concentration. No apparent delusions, illusions, hallucinations   Impression & Recommendations:  Problem # 1:  ROUTINE GYNECOLOGICAL EXAMINATION (ICD-V72.31)  Orders: UA Dipstick w/o Micro (manual) (81002) KOH/ WET [place] [phone]) Pap Smear, Thin Prep ( Collection of) [phone]) T-Syphilis Test (RPR) [phone]) Urine Pregnancy Test  KK:[id number]) T- GC Chlamydia RL:[id number]) T-HIV Antibody  (Reflex) [id number])  Problem # 2:  CONTRACEPTIVE MANAGEMENT (ICD-V25.09) Depo provera if urine HCG negative  Problem # 3:  MONILIAL VAGINITIS (ICD-112.1) Severe-discussed with pt. that needs to get sugars under control to keep this under control Her updated medication list for this problem includes:    Fluconazole 150 Mg Tabs (Fluconazole) .Marland Kitchen... 1 tab by mouth daily for 5 days  Problem # 4:  ACNE VULGARIS (ICD-706.1) Await labs before deciding on treatment--may need to avoid anything too drying with her eczema.  Problem # 5:  DM, UNCOMPLICATED, TYPE I, UNCONTROLLED (ICD-250.03) Long discussion regarding compliance. Her updated  medication list for this problem includes:    Novolog Mix 70/30 Flexpen 70-30 % Susp (Insulin aspart prot & aspart) .Marland Kitchen... 25 units subcutaneously two times a day 15 minutes  before meals    Glucagen Diagnostic Kit 1 Mg Solr (Glucagon hcl rdna (diagnostic)) ..... Use as directed for low blood sugar and unable to take by mouth    Lisinopril 2.5 Mg Tabs (Lisinopril) .Marland Kitchen... 1 tab by mouth daily  Orders: T- Hemoglobin A1C [id number]) T-Urine Microalbumin w/creat. ratio [phone]) T-TSH [phone])  Problem # 6:  HYPERCHOLESTEROLEMIA (ICD-272.0) Not taking meds currently Her updated medication list for this problem includes:    Lipitor 10 Mg Tabs (Atorvastatin calcium) .Marland Kitchen... 1 tab by mouth daily  Orders: T-Lipid Profile [id number])  Complete Medication List: 1)  Novolog Mix 70/30 Flexpen 70-30 % Susp (Insulin aspart prot & aspart) .... 25 units subcutaneously two times a day 15 minutes before meals 2)  Glucagen Diagnostic Kit 1 Mg Solr (Glucagon hcl rdna (diagnostic)) .... Use as directed for low blood sugar and unable to take by mouth 3)  Fexofenadine Hcl 180 Mg Tabs (Fexofenadine hcl) .Marland Kitchen.. 1 tab by mouth daily as needed allergies 4)  Insulin Needles  .... For two times a day injections with novolog flexpen 5)  Depo-provera 150 Mg/ml Susp (Medroxyprogesterone acetate) .Marland KitchenMarland KitchenMarland Kitchen 150 mg im today and every 3 months.  if pt. does not keep current, cannot have refill of lipitor or lisinopril 6)  Lisinopril 2.5 Mg Tabs (Lisinopril) .Marland Kitchen.. 1 tab by mouth daily 7)  Lipitor 10 Mg Tabs (Atorvastatin calcium) .Marland Kitchen.. 1 tab by mouth daily 8)  Naprosyn 500 Mg Tabs (Naproxen) .... Take 1 tablet by mouth two times a day for 5-7 days, then take two times a day as needed for pain 9)  Fluconazole 150 Mg Tabs (Fluconazole) .Marland Kitchen.. 1 tab by mouth daily for 5 days  Other Orders: Capillary Blood Glucose/CBG [id number]) T-Comprehensive Metabolic Panel ([id number]) T-CBC w/Diff ST:[id number])  Patient Instructions: 1)  4-5 cups low fat milk, yogurt or cheese daily 2)  Daily exercise. 3)  Be consistent with diet and insulin, meds. 4)  Follow up with Dr. Amil Amen in 3 months    Prescriptions: FLUCONAZOLE 150 MG TABS (FLUCONAZOLE) 1 tab by mouth daily for 5 days  #5 x 0   Entered and Authorized by:   Mack Hook MD   Signed by:   Mack Hook MD on 05/25/2009   Method used:   Electronically to        [facility] [phone]* (retail)       [place], [place]  [address]       Ph: [phone]       Fax: [phone]   [id number] LIPITOR 10 MG TABS (ATORVASTATIN CALCIUM) 1 tab by mouth daily  #30 x 11   Entered and Authorized by:   Mack Hook MD   Signed by:   Mack Hook MD on 05/25/2009   Method used:   Faxed to ...       [facility] (retail)       [place], [place]  [address]       Ph: [phone]       Fax: [phone]   RxID:   [id number] LISINOPRIL 2.5 MG TABS (LISINOPRIL) 1 tab by mouth daily  #30 x 11   Entered and Authorized by:   Benjamine Mola  Izabelle Daus MD   Signed by:   Mack Hook MD on 05/25/2009   Method used:   Faxed to ...       [facility] (retail)       [place], [place]  [address]       Ph: [phone]       Fax: [phone]   RxID:   [id number] DEPO-PROVERA 150 MG/ML SUSP (MEDROXYPROGESTERONE ACETATE) 150 mg IM today and every 3 months.  If pt. does not keep current, cannot have refill of Lipitor or Lisinopril  #1 x 3   Entered and Authorized by:   Mack Hook MD   Signed by:   Mack Hook MD on 05/25/2009   Method used:   Faxed to ...       [facility] (retail)       [place], [place]  [address]       Ph: [phone]       Fax: [phone]   RxID:   [id number] INSULIN NEEDLES For two times a day injections with Novolog Flexpen  #60 x 11   Entered and Authorized by:   Mack Hook MD   Signed by:   Mack Hook MD on 05/25/2009   Method used:    Faxed to ...       [facility] (retail)       [place], [place]  [address]       Ph: [phone] [place]       Fax: [phone]   RxID:   [id number] FEXOFENADINE HCL 180 MG  TABS (FEXOFENADINE HCL) 1 tab by mouth daily as needed allergies  #30 x 11   Entered and Authorized by:   Mack Hook MD   Signed by:   Mack Hook MD on 05/25/2009   Method used:   Faxed to ...       [facility] (retail)       [place], [place]  [address]       Ph: [phone] [place]       Fax: [phone]   RxID:   [id number] GLUCAGEN DIAGNOSTIC KIT 1 MG  SOLR (GLUCAGON HCL RDNA (DIAGNOSTIC)) Use as directed for low blood sugar and unable to take by mouth  #1 x 1   Entered and Authorized by:   Mack Hook MD   Signed by:   Mack Hook MD on 05/25/2009   Method used:   Faxed to ...       [facility] (retail)       [place], [place]  [address]       Ph: [phone]       Fax: [phone]   RxID:   [id number] NOVOLOG MIX 70/30 FLEXPEN 70-30 %  SUSP (INSULIN ASPART PROT & ASPART) 25 units subcutaneously two times a day 15 minutes before meals  #1 month x 11   Entered and Authorized by:   Mack Hook MD   Signed by:   Mack Hook MD on 05/25/2009   Method used:   Faxed to ...       [facility] (retail)       [address]  [place], [place]  [address]       Ph: [phone]       Fax: [phone]   RxID:   [id number]    Preventive Care Screening  Prior Values:    PPD:  negative (10/11/2007)    Last Tetanus Booster:  Historical (08/15/2000)    Last Flu Shot:  Fluvax 3+ (01/28/2009)    Last Pneumovax:  Pneumovax (03/28/2007)     SBE:  monthly--no changes Osteoprevention:  likes milk, but only one serving daily  Laboratory  Results   Urine Tests    Routine Urinalysis   Glucose: >=1000   (Normal Range: Negative) Bilirubin: negative   (Normal Range: Negative) Ketone: small (15)   (Normal Range: Negative) Spec. Gravity: 1.020   (Normal Range: 1.003-1.035) Blood: large   (Normal Range: Negative) pH: 5.5   (Normal Range: 5.0-8.0) Protein: >=300   (Normal Range: Negative) Urobilinogen: 0.2   (Normal Range: 0-1) Nitrite: negative   (Normal Range: Negative) Leukocyte Esterace: trace   (Normal Range: Negative)     Blood Tests     CBG Random:: 329mg /dL      Appended Document: cpp////kt  Laboratory Results   Urine Tests      Urine HCG: negative      Medication Administration  Injection # 1:    Medication: Depo-Provera 150mg     Diagnosis: CONTRACEPTIVE MANAGEMENT (ICD-V25.09)    Route: IM    Site: L deltoid    Exp Date: 04/17/2011    Lot #: [id number]    Mfr: Pharmacia    Comments: [id number] next shot due 08-16-09    Given by: Shellia Carwin CMA (May 25, 2009 11:01 AM)  Orders Added: 1)  Depo-Provera 150mg  [J1055] 2)  Admin of Therapeutic Inj  intramuscular or subcutaneous [96372]   Appended Document: HIV screening    Clinical Lists Changes  Observations: Added new observation of HIVRAPIDRSLT: negative (05/25/2009 11:40)      Laboratory Results  Date/Time Received: May 25, 2009 11:40 AM  Date/Time Reported: May 25, 2009 11:40 AM   Other Tests  Rapid HIV: negative   Appended Document: cpp////kt     Clinical Lists Changes  Orders: Added new Test order of T-Pap Smear, Thin Prep [id number]) - Signed Observations: Added new observation of WHIFF TEST: Negative whiff (05/25/2009 14:06) Added new observation of KOH PREP: 2+ (05/25/2009 14:06)      Laboratory Results    Wet [place]/KOH Source: vaginal WBC/hpf: 10-20 Bacteria/hpf: 1+ Clue cells/hpf: few  Negative whiff Yeast/hpf: many Trichomonas/hpf: none

## 2010-05-17 NOTE — Progress Notes (Signed)
Summary: Ortho referral update  Phone Note From Other Clinic   Caller: Referral Coordinator Reason for Call: Schedule Patient Appt Action Taken: Patient called Summary of Call: Pt aware of appt on 06-01-09 @ 2:30pm with Venia Carbon.Pt will R/S because she needs to let her job know 2 wks in advance.Let me know if I can be of further assistance Initial call taken by: Kathrynn Ducking,  May 25, 2009 12:26 PM  Follow-up for Phone Call        Thanks. Follow-up by: Mack Hook MD,  May 25, 2009 2:05 PM

## 2010-05-17 NOTE — Miscellaneous (Signed)
   Clinical Lists Changes  Problems: Changed problem from INGUINAL PAIN, RIGHT (ICD-789.09) to INGUINAL PAIN, BILATERAL (ICD-789.09) - Reported sclerosis and cyt on left and right femoral necks.  Pincer deformity.  Noted with [facility] ortho on Xray

## 2010-05-17 NOTE — Progress Notes (Signed)
Summary: Patient requests different pain medication  Medications Added DICLOFENAC SODIUM 75 MG TBEC (DICLOFENAC SODIUM) Take 1 tablet by mouth two times a day with food as needed for pain       Phone Note Call from Patient Call back at Home Phone [phone] Call back at [phone]   Summary of Call: the pt was taking naproxen 500 mg and the ibuprofin 800 mg but neither one is helping her with the hip and then she wants to know if the provider can prescribed her something else.  The pt have gone twice to [facility] and she may will have a hip surgery.  [phone] number)  Amil Amen MD Initial call taken by: Alexis Goodell,  July 28, 2009 12:21 PM  Follow-up for Phone Call        The pt called bacak again and she needs someone call her back today if that is posible..  You can reach her at work because she end her shift at 5:30 pm..Graciela Kellar  July 29, 2009 9:02 AM  Additional Follow-up for Phone Call Additional follow up Details #1::        Called pt @ [phone] and [phone], Left message on answering machine for pt to return call. ........Marland Kitchen Oswaldo Conroy SMA    August 03, 2009 12:10 PM   Pt. called, stated she wants Dr. Amil Amen to give prescibed her pain meds. She states she has been having hip pain since 2007 and has seen Dr. Geraldine Contras @ [facility]. 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 She was given Feldene 20mg  for the pain on 07/15/09 and has a f/u appt. in 6 weeks from 07/15/09. In the meantime, she would like a dif. pain med., b/c this ones makes her feel "dizzy and spaced out." She describes her pain as a jolting pain in her left side that radiates to her pelvic area rating her pain an "8".   Oswaldo Conroy SMA     August 03, 2009 3:36 PM     Additional Follow-up for Phone Call Additional follow up Details #2::    Need notes from ortho at [facility].  Stop Feldene.  Stop Naproxen.  Stop Ibuprofen. Use tylenol:  Tylenol 500  mg 2 tablets two times a day. Rx on your desk for diclofenac.  She can take every 12 hours as needed for pain.  Follow-up by: Richardson Dopp PA-C,  August 04, 2009 1:19 PM  Additional Follow-up for Phone Call Additional follow up Details #3:: Details for Additional Follow-up Action Taken: spoke with pt and advised her about the stops of certain meds... and fax [facility] rx to [facility] on [address]... i called it in... [place] Shannon  August 04, 2009 4:22 PM   New/Updated Medications: DICLOFENAC SODIUM 75 MG TBEC (DICLOFENAC SODIUM) Take 1 tablet by mouth two times a day with food as needed for pain Prescriptions: DICLOFENAC SODIUM 75 MG TBEC (DICLOFENAC SODIUM) Take 1 tablet by mouth two times a day with food as needed for pain  #60 x 2   Entered and Authorized by:   Richardson Dopp PA-C   Signed by:   Richardson Dopp PA-C on 08/04/2009   Method used:   Printed then faxed to ...       [facility] (retail)       [address]       [place], [place]  [address]  Ph: [phone]       Fax: [phone]   [id number]

## 2010-05-17 NOTE — Progress Notes (Signed)
Summary: [facility] CALL  Phone Note Call from Patient   Summary of Call: Pt thinks she has strap throat//Please call her back @ [phone]//No appt available today Initial call taken by: Roland Earl,  March 22, 2010 8:12 AM  Follow-up for Phone Call        PT McKenna TEST/// Follow-up by: Mordecai Maes CMA,  March 22, 2010 3:29 PM

## 2010-05-17 NOTE — Progress Notes (Signed)
   Phone Note Call from Patient Call back at Home Phone [phone]   Summary of Call: Since Sunday evening, the pt  has burning sensation in her chest and when that happen to her she drinks water but she had a pneumonia shot couple weeks ago and she wants to know if this is related with the chest burning sensation.  Pt is requesting somebody call her back. Mulberry MD Initial call taken by: Alexis Goodell,  June 08, 2009 2:45 PM  Follow-up for Phone Call        Left message on answering machine for pt to return call at [phone] Follow-up by: Shellia Carwin CMA,  June 08, 2009 3:52 PM  Additional Follow-up for Phone Call Additional follow up Details #1::        Pt called back and is c/o dry cough that started Sunday and worse at night.  Makes her chest burn.  She has tried [employer], but it didn't help.  She does not have a humidafier.  NKDA.  [facility]. Additional Follow-up by: Shellia Carwin CMA,  June 08, 2009 4:30 PM    Additional Follow-up for Phone Call Additional follow up Details #2::    Make sure she does not have a high fever and is not coughing up sputum.  Likely viral illness. Drink plenty of fluids.  Get plenty of rest. Robitussin DM ok (make sure low sugar content .. . if not ask pharmacist for cough med that is low in sugar) Tylenol as needed for pain or fever. Generic zyrtec (cetirizine) 10 mg once daily as needed for drainage, sneezing, etc. She can use saline nose spray around the clock. Should get better in 7-10 days.  Make appt if no better or sooner if worse. Go to ED for fever of 101 or higher with assoc chest pain, sob or productive cough. Follow-up by: Richardson Dopp PA-C,  June 08, 2009 5:36 PM  Additional Follow-up for Phone Call Additional follow up Details #3:: Details for Additional Follow-up Action Taken: Pt aware and is not having fever or productive cough. Additional Follow-up by: Shellia Carwin CMA,  June 09, 2009 9:21 AM

## 2010-05-17 NOTE — Assessment & Plan Note (Signed)
Summary: 2209 PT/ LEFT LEG PAIN//GK   Vital Signs:  Patient profile:   30 year old female Menstrual status:  regular Height:      67 inches Weight:      149 pounds Temp:     98.7 degrees F oral Pulse rate:   114 / minute Pulse rhythm:   regular Resp:     18 per minute BP sitting:   121 / 76  (left arm) Cuff size:   regular  Vitals Entered By: [place] Shannon (May 11, 2009 3:46 PM) CC: pt is here for left leg pain.... pt says she has been in pain since 2006 and has had a MRI, PT, and xrays but nothing has helped... Is Patient Diabetic? Yes Pain Assessment Patient in pain? no       Does patient need assistance? Functional Status Self care Ambulation Normal   Primary Care Provider:  Mack Hook MD  CC:  pt is here for left leg pain.... pt says she has been in pain since 2006 and has had a MRI, PT, and and xrays but nothing has helped....  History of Present Illness: 30 year old female with a history of type 1 diabetes mellitus who presents with complaints of left hip pain.  This problem has been present for the last 4+ years.  She originally saw someone at the orthopedic urgent care on [facility] in 2006.  She was given a steroid injection for what sounds like trochanteric bursitis.  This provided relief for about 6-8 months.  She then became the patient at [facility] and had another injection that lasted again for about another 6 months.  She then became a patient of Dr. Amil Amen.  She treated her with a Toradol injection.  The patient knows that this lasted for about a month.  She has had an x-ray and an MRI of her pelvis and hips.  These are then unremarkable.  She was sent to physical therapy with minimal relief.  She was originally set up peripheral to [facility].  They turned her down and she was supposed to get referred to [facility].  That referral never took place.  She notes no injury preceding this pain.  It comes and goes.  When she does have the pain  certain movements cause discomfort.  The discomfort is usually over her lateral hip around her trochanter as well as in her inguinal region.  There is some radiation down her anterior thigh.  She describes it as a hot burning type shooting pain.  She does note that rolling over in bed at night and changing positioning causes pain and will wake her up.  She feels worsened pain with extension at her knee.  She denies back pain.  She denies any radicular type symptoms.  She does take ibuprofen with relief.  Problems Prior to Update: 1)  Contraceptive Management  (ICD-V25.09) 2)  Vaginal Discharge  (ICD-623.5) 3)  Contraceptive Management  (ICD-V25.09) 4)  Carbuncle and Furuncle of Unspecified Site  (ICD-680.9) 5)  Microalbuminuria  (ICD-791.0) 6)  Hypercholesterolemia  (ICD-272.0) 7)  Inguinal Pain, Right  (ICD-789.09) 8)  Monilial Vaginitis  (ICD-112.1) 9)  Bacterial Vaginosis  (ICD-616.10) 10)  Dysuria  (ICD-788.1) 11)  Screening Examination For Pulmonary Tuberculosis  (ICD-V74.1) 12)  Sexual Activity, High Risk  (ICD-V69.2) 13)  Delayed Menses  (ICD-626.8) 14)  Cystitis  (ICD-595.9) 15)  Rhinoconjunctivitis, Allergic  (ICD-477.9) 16)  Dental Pain  (ICD-525.9) 17)  Dm, Uncomplicated, Type I, Uncontrolled  (ICD-250.03) 18)  Hx, Personal, Past Noncompliance  (ICD-V15.81) 19)  Abortion, Spontaneous  (ICD-634.90) 20)  History Infected Left Labial Bartholin's Gland  (ICD-616.89) 21)  Abscess  (ICD-682.9) 22)  Pelvic Inflammatory Disease  (ICD-614.9) 23)  Hyperlipidemia  (ICD-272.4)  Current Medications (verified): 1)  Novolog Mix 70/30 Flexpen 70-30 %  Susp (Insulin Aspart Prot & Aspart) .... 20 Units Subcutaneously Two Times A Day 15 Minutes Before Meals 2)  Glucagen Diagnostic Kit 1 Mg  Solr (Glucagon Hcl Rdna (Diagnostic)) .... Use As Directed For Low Blood Sugar and Unable To Take By Mouth 3)  Fexofenadine Hcl 180 Mg  Tabs (Fexofenadine Hcl) .Marland Kitchen.. 1 Tab By Mouth Daily As Needed  Allergies 4)  Insulin Needles .... For Two Times A Day Injections With Novolog Flexpen 5)  Depo-Provera 150 Mg/ml Susp (Medroxyprogesterone Acetate) .Marland KitchenMarland KitchenMarland Kitchen 150 Mg Im Today and Every 3 Months.  If Pt. Does Not Keep Current, Cannot Have Refill of Lipitor or Lisinopril 6)  Lisinopril 2.5 Mg Tabs (Lisinopril) .Marland Kitchen.. 1 Tab By Mouth Daily 7)  Lipitor 10 Mg Tabs (Atorvastatin Calcium) .Marland Kitchen.. 1 Tab By Mouth Daily  Allergies (verified): No Known Drug Allergies  Past History:  Past Medical History: Last updated: 09/01/2006 microalbumin 2/07 normal  Physical Exam  General:  alert, well-developed, and well-nourished.   Head:  normocephalic and atraumatic.   Msk:  no spinal tenderness to palpation Negative straight leg raise bilaterally No pain over the greater trochanter on the left No pain with external and internal rotation of the left hip No deformity noted Neurologic:  alert & oriented X3, cranial nerves II-XII intact, and DTRs symmetrical and normal.   Psych:  normally interactive.     Impression & Recommendations:  Problem # 1:  INGUINAL PAIN, RIGHT (ICD-789.09)  chronic problem x 4+ years multiple tests and treatment modalities tried without success d/w Dr. Amil Amen she had tried to send her to [place]., but we think the patient just did not f/u repeat xray get ESR tx with Naprosyn x 5-7 days, then as needed heat as tolerated f/u with PCP as scheduled  Orders: Diagnostic X-Ray/Fluoroscopy (Diagnostic X-Ray/Flu) T-Sed Rate (Automated) [id number]) Orthopedic Surgeon Referral (Ortho Surgeon)  Her updated medication list for this problem includes:    Naprosyn 500 Mg Tabs (Naproxen) .Marland Kitchen... Take 1 tablet by mouth two times a day for 5-7 days, then take two times a day as needed for pain  Problem # 2:  DM, UNCOMPLICATED, TYPE I, UNCONTROLLED (ICD-250.03) patient no longer taking ACE or statin d/w patient rationale and importance of taking these drugs  Her updated medication  list for this problem includes:    Novolog Mix 70/30 Flexpen 70-30 % Susp (Insulin aspart prot & aspart) .Marland Kitchen... 20 units subcutaneously two times a day 15 minutes before meals    Glucagen Diagnostic Kit 1 Mg Solr (Glucagon hcl rdna (diagnostic)) ..... Use as directed for low blood sugar and unable to take by mouth    Lisinopril 2.5 Mg Tabs (Lisinopril) .Marland Kitchen... 1 tab by mouth daily  Complete Medication List: 1)  Novolog Mix 70/30 Flexpen 70-30 % Susp (Insulin aspart prot & aspart) .... 20 units subcutaneously two times a day 15 minutes before meals 2)  Glucagen Diagnostic Kit 1 Mg Solr (Glucagon hcl rdna (diagnostic)) .... Use as directed for low blood sugar and unable to take by mouth 3)  Fexofenadine Hcl 180 Mg Tabs (Fexofenadine hcl) .Marland Kitchen.. 1 tab by mouth daily as needed allergies 4)  Insulin Needles  .... For two times a  day injections with novolog flexpen 5)  Depo-provera 150 Mg/ml Susp (Medroxyprogesterone acetate) .Marland KitchenMarland KitchenMarland Kitchen 150 mg im today and every 3 months.  if pt. does not keep current, cannot have refill of lipitor or lisinopril 6)  Lisinopril 2.5 Mg Tabs (Lisinopril) .Marland Kitchen.. 1 tab by mouth daily 7)  Lipitor 10 Mg Tabs (Atorvastatin calcium) .Marland Kitchen.. 1 tab by mouth daily 8)  Naprosyn 500 Mg Tabs (Naproxen) .... Take 1 tablet by mouth two times a day for 5-7 days, then take two times a day as needed for pain  Patient Instructions: 1)  Take the naprosyn for 5-7 days straight, even if you feel like the pain is better. 2)  Then take as needed. 3)  You can use heat three times a day. 4)  We will try to get you scheduled again to see the orthopedist in [facility]. 5)  Follow up with Dr. Amil Amen as scheduled. Prescriptions: NAPROSYN 500 MG TABS (NAPROXEN) Take 1 tablet by mouth two times a day for 5-7 days, then take two times a day as needed for pain  #60 x 1   Entered and Authorized by:   Richardson Dopp PA-C   Signed by:   Richardson Dopp PA-C on 05/11/2009   Method used:   Print then Give to Patient    RxID:   [id number]

## 2010-05-17 NOTE — Letter (Signed)
Summary: Lipid Letter  [facility]  [address] [place], [place] [address]   Phone: [phone]  Fax: [phone]    06/19/2009  Carrie [name] [address] [place], [place]  [address]  Dear Carrie [name]:  We have carefully reviewed your last lipid profile from 05/25/2009 and the results are noted below with a summary of recommendations for lipid management.    Cholesterol:       193     Goal: <200   HDL "good" Cholesterol:   95     Goal: >45   LDL "bad" Cholesterol:   87     Goal: <70   Triglycerides:       56     Goal: <150    Your cholesterol is pretty good on the Lipitor--though would like to see the LDL just a bit lower--no change in med, but if there are some dietary changes you can make, would recommend doing so.  Rest of labs were okay.  Pap was normal except also noted the yeast infection--let me know if that did not go away.    TLC Diet (Therapeutic Lifestyle Change): Saturated Fats & Transfatty acids should be kept < 7% of total calories ***Reduce Saturated Fats Polyunstaurated Fat can be up to 10% of total calories Monounsaturated Fat Fat can be up to 20% of total calories Total Fat should be no greater than 25-35% of total calories Carbohydrates should be 50-60% of total calories Protein should be approximately 15% of total calories Fiber should be at least 20-30 grams a day ***Increased fiber may help lower LDL Total Cholesterol should be < 200mg /day Consider adding plant stanol/sterols to diet (example: Benacol spread) ***A higher intake of unsaturated fat may reduce Triglycerides and Increase HDL    Adjunctive Measures (may lower LIPIDS and reduce risk of Heart Attack) include: Aerobic Exercise (20-30 minutes 3-4 times a week) Limit Alcohol Consumption Weight Reduction Aspirin 75-81 mg a day by mouth (if not allergic or contraindicated) Dietary Fiber 20-30 grams a day by mouth     Current Medications: 1)    Novolog Mix 70/30 Flexpen 70-30 %   Susp (Insulin aspart prot & aspart) .... 25 units subcutaneously two times a day 15 minutes before meals 2)    Glucagen Diagnostic Kit 1 Mg  Solr (Glucagon hcl rdna (diagnostic)) .... Use as directed for low blood sugar and unable to take by mouth 3)    Fexofenadine Hcl 180 Mg  Tabs (Fexofenadine hcl) .Marland Kitchen.. 1 tab by mouth daily as needed allergies 4)    Insulin Needles  .... For two times a day injections with novolog flexpen 5)    Depo-provera 150 Mg/ml Susp (Medroxyprogesterone acetate) .Marland KitchenMarland KitchenMarland Kitchen 150 mg im today and every 3 months.  if pt. does not keep current, cannot have refill of lipitor or lisinopril 6)    Lisinopril 2.5 Mg Tabs (Lisinopril) .Marland Kitchen.. 1 tab by mouth daily 7)    Lipitor 10 Mg Tabs (Atorvastatin calcium) .Marland Kitchen.. 1 tab by mouth daily 8)    Naprosyn 500 Mg Tabs (Naproxen) .... Take 1 tablet by mouth two times a day for 5-7 days, then take two times a day as needed for pain 9)    Fluconazole 150 Mg Tabs (Fluconazole) .Marland Kitchen.. 1 tab by mouth daily for 5 days  If you have any questions, please call. We appreciate being able to work with you.   Sincerely,    [facility] Mack Hook MD

## 2010-05-25 NOTE — Letter (Signed)
Summary: MAILED REQUESTED RECORDS TO [facility]  MAILED REQUESTED RECORDS TO [facility]   Imported By: Roland Earl 05/17/2010 15:53:27  _____________________________________________________________________  External Attachment:    Type:   Image     Comment:   External Document

## 2010-09-02 NOTE — Op Note (Signed)
Carrie [name], Carrie [name]                ACCOUNT NO.:  [id number]   MEDICAL RECORD NO.:  [id number]          PATIENT TYPE:  INP   LOCATION:  9318                          FACILITY:  [facility]   PHYSICIAN:  Everett Graff, M.D.   DATE OF BIRTH:  10/31/1980   DATE OF PROCEDURE:  DATE OF DISCHARGE:                                 OPERATIVE REPORT   PREOPERATIVE DIAGNOSIS:  Missed abortion.   POSTOPERATIVE DIAGNOSIS:  Missed abortion.   OPERATION/PROCEDURE:  Suction dilatation and curettage.   ANESTHESIA:  MAC.   SURGEON:  Everett Graff, M.D.   FLUIDS:  200 mL.   URINE OUTPUT:  Approximately 50 mL via straight catheter throughout the  procedure.   ESTIMATED BLOOD LOSS:  Minimal.   SPECIMENS:  Products of conception.   COMPLICATIONS:  None.   FINDINGS:  Uterus sounded to 9 cm.   DESCRIPTION OF PROCEDURE:  The patient was taken to the operating room after  risks, benefits and alternatives were discussed with the patient, the  patient verbalized her understanding and consent was signed and witnessed.  The patient was place under MAC for anesthesia and prepped and draped in the  normal sterile fashion.  A bilateral speculum was placed in the patient's  vagina and a total of 10 mL of 2% lidocaine was administered for a  paracervical block.  Uterus was sounded to 9 cm, and after tenaculum placed  on the anterior lip of the cervix, size 8 suction curettage was performed.  Sharp curettage then performed until a gritty texture was noted.  Suction  curettage was then performed once again to remove any remaining debris.  Sponge count was correct.  The patient tolerated the procedure well and was  awaiting transport to the recovery room.       AR/MEDQ  D:  09/11/2004  T:  09/11/2004  Job:  [id number]

## 2010-09-02 NOTE — H&P (Signed)
NAMEMAEVYN, MAHNKEN NO.:  [id number]   MEDICAL RECORD NO.:  [id number]          PATIENT TYPE:  MAT   LOCATION:  MATC                          FACILITY:  [facility]   PHYSICIAN:  Serita Grammes, C.N.M.  DATE OF BIRTH:  09-19-1980   DATE OF ADMISSION:  03/18/2004  DATE OF DISCHARGE:  03/18/2004                                HISTORY & PHYSICAL   HISTORY OF PRESENT ILLNESS:  Ms. Dacy is a 30 year old single black female,  gravida 4, para 1-0-2-1 who is seen at the [facility] office  today and with the diagnosis of SAB in progress.  It was also noted that her  CBG was 363 mg/dL and it was recommended that she go to the [facility] for admission due to uncontrolled diabetes and possible D&E.  She was evaluated at [facility] on Sep 07, 2004, for first trimester  bleeding and was sent for beta hCG which was 7722.2.   ALLERGIES:  She has no medication allergies.   PAST MEDICAL HISTORY:  She experienced menarche at the age of 65 with 28 day  cycles lasting 5 days.  She has taken oral contraceptives in the past  between ages 61 and 47.  She has also taken Depo-Provera after her first  pregnancy.  She had an abnormal Pap smear 2001 with some sort of frozen  procedure on her cervix.  She has had ovarian cysts that have ruptured  spontaneously.  She was diagnosed with Trichomonas in 2003.  She has  occasional yeast infections.  She reports having had the usual childhood  illnesses.  She was diagnosed at the age of 29 with diabetes and has not  taken blood sugars nor used her insulin nor seen her endocrinologist in the  past few months.  She has had bladder infections in childhood.   FAMILY MEDICAL HISTORY:  Remarkable for maternal uncle with heart disease,  paternal grandmother with hypertension.  Multiple family members with  diabetes.  Paternal grandmother with kidney failure.  Maternal grandmother  with Alzheimer's.  Paternal aunt with  breast cancer.  Paternal aunt with  schizophrenia.   GENETIC HISTORY:  Remarkable for father of the baby with twins in the  family.   SOCIAL HISTORY:  The patient is single.  The father of the baby name is  Tremaine.  He is involved and supportive.  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  The father of  the baby has 61 years of education and is employed full time as a Forensic psychologist.  They deny any alcohol, tobacco or illicit drug use with the  pregnancy.   PHYSICAL EXAMINATION:  VITAL SIGNS:  Her vital signs are stable.  She is  afebrile.  HEENT:  Grossly within normal limits.  CHEST:  Clear to auscultation.  HEART:  Regular rate and rhythm.  ABDOMEN:  Soft and nontender with uterine size approximately 8 weeks'  gestation.  PELVIC:  Deferred.  EXTREMITIES:  Normal.  Capillary blood glucose is 363 mg/dL.  LABORATORY DATA:  Chem-9 is remarkable for a +3 of glucose, small ketones,  and +2 blood.  Quantitative beta hCG from Sep 07, 2004, is 7722.2.  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   ASSESSMENT:  1.  Spontaneous abortion.  2.  Uncontrolled diabetes.   PLAN:  1.  Admit to [facility] for diabetes control.  2.  Options regarding medical, surgical or expectant management of the      abortion have been reviewed with the patient, and she is leaning towards      D&E at this time.  3.  Due to child care issues, the patient is not able to present to [facility] on Sep 09, 2004, but plans to arrive in the morning on Sep 10, 2004, for care of her diabetes and for D&E, and she has been instructed      to remain NPO after midnight tonight.      KS/MEDQ  D:  09/09/2004  T:  09/09/2004  Job:  [id number]

## 2010-09-27 ENCOUNTER — Inpatient Hospital Stay (HOSPITAL_COMMUNITY): Payer: Medicaid Other

## 2010-09-27 ENCOUNTER — Inpatient Hospital Stay (HOSPITAL_COMMUNITY)
Admission: AD | Admit: 2010-09-27 | Discharge: 2010-09-30 | DRG: 781 | Disposition: A | Discharge status: Home / Self Care | Payer: Medicaid Other | Source: Ambulatory Visit | Attending: Obstetrics & Gynecology | Admitting: Obstetrics & Gynecology

## 2010-09-27 DIAGNOSIS — O24919 Unspecified diabetes mellitus in pregnancy, unspecified trimester: Secondary | ICD-10-CM

## 2010-09-27 DIAGNOSIS — O26899 Other specified pregnancy related conditions, unspecified trimester: Secondary | ICD-10-CM

## 2010-09-27 DIAGNOSIS — E119 Type 2 diabetes mellitus without complications: Secondary | ICD-10-CM | POA: Diagnosis present

## 2010-09-27 LAB — CBC
HCT: 41.1 % (ref 36.0–46.0)
MCH: 31.1 pg (ref 26.0–34.0)
MCV: 89.3 fL (ref 78.0–100.0)
Platelets: 298 10*3/uL (ref 150–400)
RDW: 12 % (ref 11.5–15.5)

## 2010-09-27 LAB — COMPREHENSIVE METABOLIC PANEL
AST: 9 U/L (ref 0–37)
Albumin: 3.1 g/dL — ABNORMAL LOW (ref 3.5–5.2)
Alkaline Phosphatase: 52 U/L (ref 39–117)
BUN: 10 mg/dL (ref 6–23)
CO2: 21 mEq/L (ref 19–32)
Chloride: 96 mEq/L (ref 96–112)
Creatinine, Ser: 0.47 mg/dL (ref 0.4–1.2)
GFR calc non Af Amer: >60 mL/min (ref >60)
Potassium: 4 mEq/L (ref 3.5–5.1)
Total Bilirubin: 0.6 mg/dL (ref 0.3–1.2)

## 2010-09-27 LAB — WET PREP, GENITAL

## 2010-09-27 LAB — GLUCOSE, CAPILLARY: Glucose-Capillary: 210 mg/dL — ABNORMAL HIGH (ref 70–99)

## 2010-09-27 LAB — URINALYSIS, ROUTINE W REFLEX MICROSCOPIC
Bilirubin Urine: NEGATIVE
Glucose, UA: >1000 mg/dL — AB
Ketones, ur: 15 mg/dL — AB
Protein, ur: NEGATIVE mg/dL
pH: 5.5 (ref 5.0–8.0)

## 2010-09-27 LAB — URINE MICROSCOPIC-ADD ON

## 2010-09-27 LAB — HEMOGLOBIN A1C: Hgb A1c MFr Bld: 10.9 % — ABNORMAL HIGH (ref <5.7)

## 2010-09-28 LAB — GLUCOSE, CAPILLARY
Glucose-Capillary: 85 mg/dL (ref 70–99)
Glucose-Capillary: 155 mg/dL — ABNORMAL HIGH (ref 70–99)
Glucose-Capillary: 204 mg/dL — ABNORMAL HIGH (ref 70–99)
Glucose-Capillary: 179 mg/dL — ABNORMAL HIGH (ref 70–99)

## 2010-09-28 LAB — GC/CHLAMYDIA PROBE AMP, GENITAL
Chlamydia, DNA Probe: NEGATIVE
GC Probe Amp, Genital: NEGATIVE

## 2010-09-29 LAB — CREATININE CLEARANCE, URINE, 24 HOUR
Collection Interval-CRCL: 24 hours
Creatinine Clearance: 194 mL/min — ABNORMAL HIGH (ref 75–115)
Creatinine, 24H Ur: 1316 mg/d (ref 700–1800)
Creatinine, Urine: 103.19 mg/dL
Creatinine: 0.47 mg/dL (ref 0.4–1.2)

## 2010-09-29 LAB — GLUCOSE, CAPILLARY: Glucose-Capillary: 92 mg/dL (ref 70–99)

## 2010-10-03 LAB — PROTEIN, URINE, 24 HOUR
Protein, 24H Urine: 370 mg/d — ABNORMAL HIGH (ref 50–100)
Protein, Urine: 29 mg/dL

## 2010-10-03 LAB — GLUCOSE, CAPILLARY
Glucose-Capillary: 118 mg/dL — ABNORMAL HIGH (ref 70–99)
Glucose-Capillary: 139 mg/dL — ABNORMAL HIGH (ref 70–99)

## 2010-10-10 ENCOUNTER — Other Ambulatory Visit: Payer: Self-pay | Admitting: Family Medicine

## 2010-10-10 ENCOUNTER — Encounter: Payer: Medicaid Other | Attending: Family Medicine | Admitting: Dietician

## 2010-10-10 DIAGNOSIS — O24919 Unspecified diabetes mellitus in pregnancy, unspecified trimester: Secondary | ICD-10-CM

## 2010-10-10 DIAGNOSIS — Z713 Dietary counseling and surveillance: Secondary | ICD-10-CM | POA: Insufficient documentation

## 2010-10-10 DIAGNOSIS — O99810 Abnormal glucose complicating pregnancy: Secondary | ICD-10-CM | POA: Insufficient documentation

## 2010-10-10 LAB — POCT URINALYSIS DIP (DEVICE)
Protein, ur: 30 mg/dL — AB
Specific Gravity, Urine: >=1.03 (ref 1.005–1.030)
Urobilinogen, UA: 0.2 mg/dL (ref 0.0–1.0)
pH: 6 (ref 5.0–8.0)

## 2010-10-15 NOTE — Discharge Summary (Signed)
Carrie, [name] NO.:  [id number]  MEDICAL RECORD NO.:  [id number]  LOCATION:  9306                          FACILITY:  [facility]  PHYSICIAN:  Lynelle Smoke, DO   DATE OF BIRTH:  1980/10/19  DATE OF ADMISSION:  09/27/2010 DATE OF DISCHARGE:  09/30/2010                              DISCHARGE SUMMARY   The patient was admitted to [facility] on September 27, 2010, and was discharged home on September 30, 2010.  The patient was admitted for uncontrolled diabetes in early pregnancy.  She was admitted to a Regular Womens Unit and received consultation and from Rosita Kea, our diabetic educator and a nutritionist.  Labs were drawn and the patient was given insulin regimens and increased on her insulin regimen and obtained glycemic control.  The patient also had some nausea and vomiting at the time of admission, which were controlled with doses of Zofran.  The patient did well during her hospital course and will be discharged home in stable condition.  PERTINENT LABORATORIES:  On admission, the urinalysis that showed greater than 1000 glucose.  A CBC with a white count 7.7, hemoglobin of 14.3, hematocrit 41.1, and platelets of 298.  A wet prep was done in MAU and very few clue cells were found with a moderate amount of white blood cells.  A complete metabolic panel was also performed, sodium of 130, potassium 4.0, chloride 96, bicarb 92, glucose 329, BUN 10 and creatinine 0.47.  Alkaline phos 52, AST 9, ALT 7, total protein 7.1 albumin 3.1, calcium 9.6.  Hemoglobin A1c 10.9.  Gonorrhea and Chlamydia negative and negative.  TSH 1.150.  A 24-hour urine collection took place during her admission, which showed a 24-hour urine protein at 370 mg.  Radiology obtained during admission included an ultrasound for the anatomy, which gave [place] a gestational age at that time of 13 weeks and 5 days with an estimated date of delivery of March 30, 2011.  The anatomy seen that was able  to be visualized, all appeared normal, but most things were unable to be seen during early gestational age.  She will need a followup full anatomy ultrasound at 20 weeks.  Her cervical length was 3.8 cm.  HOSPITAL COURSE:  The patient was admitted and was provided aggressive insulin subcu and IV fluid hydration and medications for her nausea. The patient tolerated this well, received consultations with nutrition regarding her food and consultation with pastoral care regarding her desire that she was considering termination.  The patient did well during the course of her hospitalization and her sugar became well controlled on insulin regimen, which is as follows.  Humalog 12 units subcu before each meal, NPH 12 units subcu nightly 10 units subcu q.a.m. The patient will be discharged home with these medications.  Case management was able to find a free sample coupon for the Humalog KwikPen, and this was provided to her.  She will also be given a prescription for NPH as she received here in the hospital.  She will also be given a prescription for Zofran 4 mg to take as she needs it for her nausea.  The patient did apply for Medicaid  during her admission, which is currently not active.  The patient was scheduled to have an appointment in the [facility] on October 10, 2010.  The patient is recommended to keep this appointment to call the clinic with any questions.  Phone number is provided.  If in fact the patient does choose to have a termination, she should make an appointment for followup after the termination with her primary care provider.  He will then continue to assist her with her insulin control. The patient is also recommended that if she develops any severe abdominal pain, bleeding or if her sugars become uncontrolled, she should report to the MAU immediately.  The patient voices understanding and agrees to these plans.  The patient is discharged to home  in a stable and improved condition.          ______________________________ Lynelle Smoke, DO     SH/MEDQ  D:  09/30/2010  T:  10/01/2010  Job:  [id number]  Electronically Signed by Lynelle Smoke MD on 10/15/2010 09:57:06 AM

## 2011-01-13 LAB — POCT PREGNANCY, URINE: Preg Test, Ur: NEGATIVE

## 2012-06-26 ENCOUNTER — Ambulatory Visit: Payer: Self-pay | Admitting: Family Medicine

## 2012-06-26 VITALS — BP 110/72 | HR 114 | Temp 98.2°F | Resp 16 | Ht 67.5 in | Wt 149.2 lb

## 2012-06-26 DIAGNOSIS — B9689 Other specified bacterial agents as the cause of diseases classified elsewhere: Secondary | ICD-10-CM

## 2012-06-26 DIAGNOSIS — N898 Other specified noninflammatory disorders of vagina: Secondary | ICD-10-CM

## 2012-06-26 DIAGNOSIS — R21 Rash and other nonspecific skin eruption: Secondary | ICD-10-CM

## 2012-06-26 DIAGNOSIS — K649 Unspecified hemorrhoids: Secondary | ICD-10-CM

## 2012-06-26 DIAGNOSIS — Z113 Encounter for screening for infections with a predominantly sexual mode of transmission: Secondary | ICD-10-CM

## 2012-06-26 DIAGNOSIS — R300 Dysuria: Secondary | ICD-10-CM

## 2012-06-26 DIAGNOSIS — A6000 Herpesviral infection of urogenital system, unspecified: Secondary | ICD-10-CM

## 2012-06-26 LAB — POCT URINALYSIS DIPSTICK
Bilirubin, UA: NEGATIVE
Glucose, UA: 500
Ketones, UA: NEGATIVE
Nitrite, UA: NEGATIVE
Protein, UA: 30
Spec Grav, UA: 1.015
Urobilinogen, UA: 0.2
pH, UA: 6

## 2012-06-26 LAB — POCT UA - MICROSCOPIC ONLY
Casts, Ur, LPF, POC: NEGATIVE
Crystals, Ur, HPF, POC: NEGATIVE
Mucus, UA: NEGATIVE
Yeast, UA: NEGATIVE

## 2012-06-26 LAB — POCT WET PREP WITH KOH
Clue Cells Wet Prep HPF POC: 100
KOH Prep POC: NEGATIVE
Trichomonas, UA: NEGATIVE
Yeast Wet Prep HPF POC: NEGATIVE

## 2012-06-26 MED ORDER — VALACYCLOVIR HCL 1 G PO TABS: 1000.0000 mg | ORAL_TABLET | Freq: Two times a day (BID) | ORAL | Status: DC

## 2012-06-26 MED ORDER — METRONIDAZOLE 500 MG PO TABS: 500.0000 mg | ORAL_TABLET | Freq: Two times a day (BID) | ORAL | Status: DC

## 2012-06-26 MED ORDER — HYDROCODONE-ACETAMINOPHEN 7.5-325 MG/15ML PO SOLN: 15.0000 mL | Freq: Once | ORAL | Status: DC

## 2012-06-26 MED ORDER — HYDROCORTISONE ACETATE 25 MG RE SUPP: 25.0000 mg | Freq: Two times a day (BID) | RECTAL | Status: DC

## 2012-06-26 MED ORDER — ALPRAZOLAM 0.25 MG PO TABS
0.2500 mg | ORAL_TABLET | Freq: Once | ORAL | Status: AC
  Administered 2012-06-26: 0.25 mg via ORAL

## 2012-06-26 MED ORDER — TRAMADOL HCL 50 MG PO TABS: 50.0000 mg | ORAL_TABLET | Freq: Three times a day (TID) | ORAL | Status: DC | PRN

## 2012-06-26 NOTE — Progress Notes (Signed)
[facility]:  Office Visit  Chief Complaint:  Chief Complaint  Patient presents with  . Hemorrhoids  . Diarrhea  . Cyst    groin- pain    HPI: Carrie [name] is a 32 y.o. female who complains of : Had GI sxs, non bloody diarrhea and N/V since Sunday after eating out multiple places ie [place], [employer], [employer], etc.  She has had nonbloody diarrhea. That is improving. She thinks she may have had so much diarrhea that she now has a hemorrhoid from wiping and using the bathroom.  Noticed on Monday there was cyst and hemorrhoid around her anal area. Tried Neosporin. Now it hurts even after hemorrhoid OTC cooling cream. Nauseated. No fevers/chills. Burns when she urinates, not sure if related to hemorrhoid and scratching. She has vaginal dc. History of chlamydia. No prior h/o HSV.   Past Medical History  Diagnosis Date  . Diabetes mellitus without complication    History reviewed. No pertinent past surgical history. History   Social History  . Marital Status: Single    Spouse Name: N/A    Number of Children: N/A  . Years of Education: N/A   Social History Main Topics  . Smoking status: Never Smoker   . Smokeless tobacco: None  . Alcohol Use: No  . Drug Use: No  . Sexually Active: None   Other Topics Concern  . None   Social History Narrative  . None   Family History  Problem Relation Age of Onset  . Hyperlipidemia Mother   . Asthma Daughter    No Known Allergies Prior to Admission medications   Medication Sig Start Date End Date Taking? Authorizing Provider  insulin aspart protamine-insulin aspart (NOVOLOG 70/30) (70-30) 100 UNIT/ML injection Inject 20 Units into the skin 2 (two) times daily with a meal.   Yes Historical Provider, MD     ROS: The patient denies fevers, chills, night sweats, unintentional weight loss, chest pain, palpitations, wheezing, dyspnea on exertion, nausea, vomiting, abdominal pain, dysuria, hematuria, melena,  numbness, weakness, or tingling.   All other systems have been reviewed and were otherwise negative with the exception of those mentioned in the HPI and as above.    PHYSICAL EXAM: Filed Vitals:   06/26/12 1708  BP: 110/72  Pulse: 114  Temp: 98.2 F (36.8 C)  Resp: 16   Filed Vitals:   06/26/12 1708  Height: 5' 7.5" (1.715 m)  Weight: 149 lb 3.2 oz (67.677 kg)   Body mass index is 23.01 kg/(m^2).  General: Alert, mild  distress HEENT:  Normocephalic, atraumatic, oropharynx patent.  Cardiovascular:  Regular rate and rhythm, no rubs murmurs or gallops.  No pedal edema.  Respiratory: Clear to auscultation bilaterally.  No wheezes, rales, or rhonchi.  No cyanosis, no use of accessory musculature GI: No organomegaly, abdomen is soft and non-tender, positive bowel sounds.  No masses. Skin: No rashes. Neurologic: Facial musculature symmetric. Psychiatric: Patient is appropriate throughout our interaction. Lymphatic: No cervical lymphadenopathy Musculoskeletal: Gait intact. GU-labia is ashy white, there is a ulcer on left labia majora, there are > 3 herpetic like lesions around hemorrhoid area. there is no odor, there is white yellow vaginal DC.  Unable to do complete speculum exam since she is unable to lay on her back due to hemorrhoids Rectal exam-+ thrombosed hemorrhoids, small but very tender at 6 'oclock      LABS: Results for orders placed in visit on 06/26/12  GC/CHLAMYDIA PROBE AMP, URINE  Result Value Range  POCT WET PREP WITH KOH      Result Value Range   Trichomonas, UA Negative     Clue Cells Wet Prep HPF POC 100%     Epithelial Wet Prep HPF POC 5-20     Yeast Wet Prep HPF POC neg     Bacteria Wet Prep HPF POC 3+     RBC Wet Prep HPF POC tntc     WBC Wet Prep HPF POC tntc     KOH Prep POC Negative    POCT URINALYSIS DIPSTICK      Result Value Range   Color, UA yellow     Clarity, UA cloudy     Glucose, UA 500     Bilirubin, UA neg     Ketones, UA neg      Spec Grav, UA 1.015     Blood, UA moderate     pH, UA 6.0     Protein, UA 30     Urobilinogen, UA 0.2     Nitrite, UA neg     Leukocytes, UA Trace    POCT UA - MICROSCOPIC ONLY      Result Value Range   WBC, Ur, HPF, POC 10-20     RBC, urine, microscopic tntc     Bacteria, U Microscopic trace     Mucus, UA neg     Epithelial cells, urine per micros 0-1     Crystals, Ur, HPF, POC neg     Casts, Ur, LPF, POC neg     Yeast, UA neg       EKG/XRAY:   Primary read interpreted by Dr. Marin Comment at [facility].   ASSESSMENT/PLAN: Encounter Diagnoses  Name Primary?  . Hemorrhoids Yes  . Vaginal discharge   . Dysuria   . Screening for STD (sexually transmitted disease)   . Rash of vulva   . Bacterial vaginosis   . Herpes genitalia    Rx Flagyl Rx Valtrex Rx Anusol HC Rx Tramadol for pain F/u when ready to get tested for other STD/ HSV, she cannot afford it today with other tests Pending labs GC uriprobe Advise patient to go see her PCP about her diabetes. She is on Novolin BID. Advise her risks re: poorly controlled DM   LE, [name], DO 06/27/2012 10:47 AM

## 2012-06-26 NOTE — Patient Instructions (Addendum)
Sitz Bath A sitz bath is a warm water bath taken in the sitting position. The water covers only the hips and butt (buttocks). It may be used for either healing or cleaning purposes. Sitz baths are also used to relieve pain, itching, or muscle tightening (spasms). The water may contain medicine. Moist heat will help you heal and relax.  HOME CARE   Fill the bathtub half-full with warm water.  Sit in the water and open the drain a little.  Turn on the warm water to keep the tub half-full. Keep the water running constantly.  Soak in the water for 15 to 20 minutes.  After the sitz bath, pat the affected area dry.  Take 3 to 4 sitz baths a day. GET HELP RIGHT AWAY IF: You get worse instead of better. Stop the sitz baths if you get worse. MAKE SURE YOU:  Understand these instructions.  Will watch your condition.  Will get help right away if you are not doing well or get worse. Document Released: 05/11/2004 Document Revised: 06/26/2011 Document Reviewed: 08/01/2010 [facility] Patient Information 2013 [place]. Hemorrhoids Hemorrhoids are enlarged (dilated) veins around the rectum. There are 2 types of hemorrhoids, and the type of hemorrhoid is determined by its location. Internal hemorrhoids occur in the veins just inside the rectum.They are usually not painful, but they may bleed.However, they may poke through to the outside and become irritated and painful. External hemorrhoids involve the veins outside the anus and can be felt as a painful swelling or hard lump near the anus.They are often itchy and may crack and bleed. Sometimes clots will form in the veins. This makes them swollen and painful. These are called thrombosed hemorrhoids. CAUSES Causes of hemorrhoids include:  Pregnancy. This increases the pressure in the hemorrhoidal veins.  Constipation.  Straining to have a bowel movement.  Obesity.  Heavy lifting or other activity that caused you to  strain. TREATMENT Most of the time hemorrhoids improve in 1 to 2 weeks. However, if symptoms do not seem to be getting better or if you have a lot of rectal bleeding, your caregiver may perform a procedure to help make the hemorrhoids get smaller or remove them completely.Possible treatments include:  Rubber band ligation. A rubber band is placed at the base of the hemorrhoid to cut off the circulation.  Sclerotherapy. A chemical is injected to shrink the hemorrhoid.  Infrared light therapy. Tools are used to burn the hemorrhoid.  Hemorrhoidectomy. This is surgical removal of the hemorrhoid. HOME CARE INSTRUCTIONS   Increase fiber in your diet. Ask your caregiver about using fiber supplements.  Drink enough water and fluids to keep your urine clear or pale yellow.  Exercise regularly.  Go to the bathroom when you have the urge to have a bowel movement. Do not wait.  Avoid straining to have bowel movements.  Keep the anal area dry and clean.  Only take over-the-counter or prescription medicines for pain, discomfort, or fever as directed by your caregiver. If your hemorrhoids are thrombosed:  Take warm sitz baths for 20 to 30 minutes, 3 to 4 times per day.  If the hemorrhoids are very tender and swollen, place ice packs on the area as tolerated. Using ice packs between sitz baths may be helpful. Fill a plastic bag with ice. Place a towel between the bag of ice and your skin.  Medicated creams and suppositories may be used or applied as directed.  Do not use a donut-shaped pillow or sit on  the toilet for long periods. This increases blood pooling and pain. SEEK MEDICAL CARE IF:   You have increasing pain and swelling that is not controlled with your medicine.  You have uncontrolled bleeding.  You have difficulty or you are unable to have a bowel movement.  You have pain or inflammation outside the area of the hemorrhoids.  You have chills or an oral temperature above 102 F  (38.9 C). MAKE SURE YOU:   Understand these instructions.  Will watch your condition.  Will get help right away if you are not doing well or get worse. Document Released: 03/31/2000 Document Revised: 06/26/2011 Document Reviewed: 03/14/2010 [facility] Patient Information 2013 [place].

## 2012-06-26 NOTE — Progress Notes (Signed)
Procedure:  Verbal consent obtained from pt.  Cleaned site.  Injected 1.5 cc 2% lidocaine plain.  S/P.  Made 0.5 cm incision.  No blood clots expressed.  Cleaned and dressed.

## 2012-06-27 LAB — GC/CHLAMYDIA PROBE AMP, URINE
Chlamydia, Swab/Urine, PCR: NEGATIVE
GC Probe Amp, Urine: NEGATIVE

## 2012-06-29 ENCOUNTER — Ambulatory Visit (INDEPENDENT_AMBULATORY_CARE_PROVIDER_SITE_OTHER): Payer: Self-pay | Admitting: Family Medicine

## 2012-06-29 VITALS — BP 130/80 | HR 106 | Temp 97.3°F | Resp 16 | Ht 67.5 in | Wt 148.0 lb

## 2012-06-29 DIAGNOSIS — B009 Herpesviral infection, unspecified: Secondary | ICD-10-CM

## 2012-06-29 DIAGNOSIS — K6289 Other specified diseases of anus and rectum: Secondary | ICD-10-CM

## 2012-06-29 MED ORDER — HYDROCODONE-ACETAMINOPHEN 5-325 MG PO TABS: 1.0000 | ORAL_TABLET | Freq: Four times a day (QID) | ORAL | Status: DC | PRN

## 2012-06-29 MED ORDER — ACYCLOVIR 400 MG PO TABS: 400.0000 mg | ORAL_TABLET | Freq: Three times a day (TID) | ORAL | Status: DC

## 2012-06-29 NOTE — Progress Notes (Signed)
[facility]:  Office Visit  Chief Complaint:  Chief Complaint  Patient presents with  . Follow-up    HPI: Carrie [name] is a 32 y.o. female who complains of  Here for recheck of rectal pain. She was dx with HSV and she given a rx for Valtrex but too expensive and could not fill. She was also given some hemorrhoid meds and tramadol for pain but did not releif any pain.   Past Medical History  Diagnosis Date  . Diabetes mellitus without complication    History reviewed. No pertinent past surgical history. History   Social History  . Marital Status: Single    Spouse Name: N/A    Number of Children: N/A  . Years of Education: N/A   Social History Main Topics  . Smoking status: Never Smoker   . Smokeless tobacco: None  . Alcohol Use: No  . Drug Use: No  . Sexually Active: None   Other Topics Concern  . None   Social History Narrative  . None   Family History  Problem Relation Age of Onset  . Hyperlipidemia Mother   . Asthma Daughter    No Known Allergies Prior to Admission medications   Medication Sig Start Date End Date Taking? Authorizing Provider  hydrocortisone (ANUSOL-HC) 25 MG suppository Place 1 suppository (25 mg total) rectally 2 (two) times daily. 06/26/12  Yes Johnathon Mittal P Dave Mergen, DO  insulin aspart protamine-insulin aspart (NOVOLOG 70/30) (70-30) 100 UNIT/ML injection Inject 20 Units into the skin 2 (two) times daily with a meal.   Yes Historical Provider, MD  metroNIDAZOLE (FLAGYL) 500 MG tablet Take 1 tablet (500 mg total) by mouth 2 (two) times daily. 06/26/12  Yes Ariez Neilan P Jarrett Chicoine, DO  traMADol (ULTRAM) 50 MG tablet Take 1 tablet (50 mg total) by mouth every 8 (eight) hours as needed for pain. 06/26/12  Yes Marshea Wisher P Peaches Vanoverbeke, DO  valACYclovir (VALTREX) 1000 MG tablet Take 1 tablet (1,000 mg total) by mouth 2 (two) times daily. 06/26/12   Shenia Alan P Brylinn Teaney, DO     ROS: The patient denies fevers, chills, night sweats, unintentional weight loss, chest pain, palpitations,  wheezing, dyspnea on exertion, nausea, vomiting, abdominal pain, dysuria, hematuria, melena, numbness, weakness, or tingling.   All other systems have been reviewed and were otherwise negative with the exception of those mentioned in the HPI and as above.    PHYSICAL EXAM: Filed Vitals:   06/29/12 1159  BP: 130/80  Pulse: 106  Temp: 97.3 F (36.3 C)  Resp: 16   Filed Vitals:   06/29/12 1159  Height: 5' 7.5" (1.715 m)  Weight: 148 lb (67.132 kg)   Body mass index is 22.82 kg/(m^2).  General: Alert, mild distress, tearful HEENT:  Normocephalic, atraumatic, oropharynx patent.  Cardiovascular:  Regular rate and rhythm, no rubs murmurs or gallops.  No Carotid bruits, radial pulse intact. No pedal edema.  Respiratory: Clear to auscultation bilaterally.  No wheezes, rales, or rhonchi.  No cyanosis, no use of accessory musculature GI: No organomegaly, abdomen is soft and non-tender, positive bowel sounds.  No masses. Skin: No rashes. Neurologic: Facial musculature symmetric. Psychiatric: Patient is appropriate throughout our interaction. Lymphatic: No cervical lymphadenopathy Musculoskeletal: Gait intact. Rectal exam-+ herpetic lesions >5 on rectal area.    LABS: Results for orders placed in visit on 06/26/12  GC/CHLAMYDIA PROBE AMP, URINE      Result Value Range   Chlamydia, Swab/Urine, PCR NEGATIVE  NEGATIVE   GC Probe  Amp, Urine NEGATIVE  NEGATIVE  POCT WET PREP WITH KOH      Result Value Range   Trichomonas, UA Negative     Clue Cells Wet Prep HPF POC 100%     Epithelial Wet Prep HPF POC 5-20     Yeast Wet Prep HPF POC neg     Bacteria Wet Prep HPF POC 3+     RBC Wet Prep HPF POC tntc     WBC Wet Prep HPF POC tntc     KOH Prep POC Negative    POCT URINALYSIS DIPSTICK      Result Value Range   Color, UA yellow     Clarity, UA cloudy     Glucose, UA 500     Bilirubin, UA neg     Ketones, UA neg     Spec Grav, UA 1.015     Blood, UA moderate     pH, UA 6.0      Protein, UA 30     Urobilinogen, UA 0.2     Nitrite, UA neg     Leukocytes, UA Trace    POCT UA - MICROSCOPIC ONLY      Result Value Range   WBC, Ur, HPF, POC 10-20     RBC, urine, microscopic tntc     Bacteria, U Microscopic trace     Mucus, UA neg     Epithelial cells, urine per micros 0-1     Crystals, Ur, HPF, POC neg     Casts, Ur, LPF, POC neg     Yeast, UA neg       EKG/XRAY:   Primary read interpreted by Dr. Marin Comment at [facility].   ASSESSMENT/PLAN: Encounter Diagnoses  Name Primary?  . Herpes simplex Yes  . Rectal pain    Rx Norco ( rx written) , constipation percautions advised Rx Acyclovir 400 mg TID x 10 days F/u prn     Shaka Zech, Roxie, DO 06/29/2012 12:21 PM

## 2012-07-18 ENCOUNTER — Ambulatory Visit (INDEPENDENT_AMBULATORY_CARE_PROVIDER_SITE_OTHER): Payer: No Typology Code available for payment source | Admitting: Family Medicine

## 2012-07-18 VITALS — BP 118/80 | HR 99 | Temp 98.8°F | Resp 16 | Ht 68.0 in | Wt 146.2 lb

## 2012-07-18 DIAGNOSIS — E109 Type 1 diabetes mellitus without complications: Secondary | ICD-10-CM

## 2012-07-18 LAB — POCT GLYCOSYLATED HEMOGLOBIN (HGB A1C): Hemoglobin A1C: 10.9

## 2012-07-18 LAB — GLUCOSE, POCT (MANUAL RESULT ENTRY): POC Glucose: 288 mg/dl — AB (ref 70–99)

## 2012-07-18 MED ORDER — INSULIN ASPART PROT & ASPART (70-30 MIX) 100 UNIT/ML ~~LOC~~ SUSP: 20.0000 [IU] | Freq: Two times a day (BID) | SUBCUTANEOUS | Status: DC

## 2012-07-18 MED ORDER — GABAPENTIN 300 MG PO CAPS: 300.0000 mg | ORAL_CAPSULE | Freq: Every day | ORAL | Status: DC

## 2012-07-18 NOTE — Patient Instructions (Addendum)
Resume usual dose of insulin tomorrow Return in 1 week if pain in feet continues.  Stop the gabapentin when the foot pain disappears Otherwise, return in 3 months

## 2012-07-18 NOTE — Progress Notes (Signed)
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  She has a 13 year hx of diabetes.  Foot pain began 3-4 days  Out of insulin for about a week.  Hasn't checked sugar. Usual diabetes doctor:  [facility] which has closed.  Doesn't like [facility]  Objective:  NAD, alert, cooperative  Exam of feet:  Mild dorsal swelling bilaterally, no ulcers or rash, diffuse hypersensitivity Chest: clear HEENT:  Normal Heart:  Reg, no murmur Skin:  Warm and dry  Results for orders placed in visit on 07/18/12  GLUCOSE, POCT (MANUAL RESULT ENTRY)      Result Value Range   POC Glucose 288 (*) 70 - 99 mg/dl  POCT GLYCOSYLATED HEMOGLOBIN (HGB A1C)      Result Value Range   Hemoglobin A1C 10.9     Assessment:   Out of medicine with moderate hyperglycemia  Plan: 20 units Novalog tonight Resume insulin Recheck 3 months Gabapentin 300 qhs until pain subsides.

## 2012-07-19 LAB — COMPREHENSIVE METABOLIC PANEL
ALT: 9 U/L (ref 0–35)
AST: 9 U/L (ref 0–37)
Albumin: 3.9 g/dL (ref 3.5–5.2)
Alkaline Phosphatase: 48 U/L (ref 39–117)
BUN: 12 mg/dL (ref 6–23)
CO2: 25 mEq/L (ref 19–32)
Calcium: 9.5 mg/dL (ref 8.4–10.5)
Chloride: 101 mEq/L (ref 96–112)
Creat: 0.63 mg/dL (ref 0.50–1.10)
Glucose, Bld: 300 mg/dL — ABNORMAL HIGH (ref 70–99)
Potassium: 3.8 mEq/L (ref 3.5–5.3)
Sodium: 137 mEq/L (ref 135–145)
Total Bilirubin: 0.8 mg/dL (ref 0.3–1.2)
Total Protein: 7 g/dL (ref 6.0–8.3)

## 2012-10-23 ENCOUNTER — Ambulatory Visit (INDEPENDENT_AMBULATORY_CARE_PROVIDER_SITE_OTHER): Payer: No Typology Code available for payment source | Admitting: Family Medicine

## 2012-10-23 ENCOUNTER — Encounter: Payer: Self-pay | Admitting: Physician Assistant

## 2012-10-23 ENCOUNTER — Encounter: Payer: Self-pay | Admitting: Family Medicine

## 2012-10-23 ENCOUNTER — Encounter (HOSPITAL_COMMUNITY): Payer: Self-pay | Admitting: *Deleted

## 2012-10-23 ENCOUNTER — Inpatient Hospital Stay (HOSPITAL_COMMUNITY)
Admission: EM | Admit: 2012-10-23 | Discharge: 2012-10-25 | DRG: 638 | Disposition: A | Discharge status: Home / Self Care | Payer: No Typology Code available for payment source | Attending: Internal Medicine | Admitting: Internal Medicine

## 2012-10-23 VITALS — BP 120/76 | HR 100 | Temp 98.0°F | Resp 16 | Ht 67.5 in | Wt 137.6 lb

## 2012-10-23 DIAGNOSIS — E1110 Type 2 diabetes mellitus with ketoacidosis without coma: Secondary | ICD-10-CM | POA: Diagnosis present

## 2012-10-23 DIAGNOSIS — E109 Type 1 diabetes mellitus without complications: Secondary | ICD-10-CM

## 2012-10-23 DIAGNOSIS — R109 Unspecified abdominal pain: Secondary | ICD-10-CM

## 2012-10-23 DIAGNOSIS — R809 Proteinuria, unspecified: Secondary | ICD-10-CM

## 2012-10-23 DIAGNOSIS — R300 Dysuria: Secondary | ICD-10-CM

## 2012-10-23 DIAGNOSIS — N739 Female pelvic inflammatory disease, unspecified: Secondary | ICD-10-CM

## 2012-10-23 DIAGNOSIS — B373 Candidiasis of vulva and vagina: Secondary | ICD-10-CM

## 2012-10-23 DIAGNOSIS — L0291 Cutaneous abscess, unspecified: Secondary | ICD-10-CM

## 2012-10-23 DIAGNOSIS — R21 Rash and other nonspecific skin eruption: Secondary | ICD-10-CM | POA: Diagnosis present

## 2012-10-23 DIAGNOSIS — N898 Other specified noninflammatory disorders of vagina: Secondary | ICD-10-CM

## 2012-10-23 DIAGNOSIS — E7800 Pure hypercholesterolemia, unspecified: Secondary | ICD-10-CM

## 2012-10-23 DIAGNOSIS — L0293 Carbuncle, unspecified: Secondary | ICD-10-CM

## 2012-10-23 DIAGNOSIS — R5381 Other malaise: Secondary | ICD-10-CM | POA: Diagnosis present

## 2012-10-23 DIAGNOSIS — N390 Urinary tract infection, site not specified: Secondary | ICD-10-CM

## 2012-10-23 DIAGNOSIS — J309 Allergic rhinitis, unspecified: Secondary | ICD-10-CM

## 2012-10-23 DIAGNOSIS — N938 Other specified abnormal uterine and vaginal bleeding: Secondary | ICD-10-CM

## 2012-10-23 DIAGNOSIS — E860 Dehydration: Secondary | ICD-10-CM

## 2012-10-23 DIAGNOSIS — R110 Nausea: Secondary | ICD-10-CM | POA: Diagnosis present

## 2012-10-23 DIAGNOSIS — IMO0001 Reserved for inherently not codable concepts without codable children: Secondary | ICD-10-CM

## 2012-10-23 DIAGNOSIS — E1310 Other specified diabetes mellitus with ketoacidosis without coma: Principal | ICD-10-CM

## 2012-10-23 DIAGNOSIS — D7289 Other specified disorders of white blood cells: Secondary | ICD-10-CM

## 2012-10-23 DIAGNOSIS — E119 Type 2 diabetes mellitus without complications: Secondary | ICD-10-CM

## 2012-10-23 DIAGNOSIS — E1065 Type 1 diabetes mellitus with hyperglycemia: Secondary | ICD-10-CM

## 2012-10-23 DIAGNOSIS — R000 Tachycardia, unspecified: Secondary | ICD-10-CM

## 2012-10-23 DIAGNOSIS — O039 Complete or unspecified spontaneous abortion without complication: Secondary | ICD-10-CM

## 2012-10-23 DIAGNOSIS — J029 Acute pharyngitis, unspecified: Secondary | ICD-10-CM

## 2012-10-23 DIAGNOSIS — D72829 Elevated white blood cell count, unspecified: Secondary | ICD-10-CM

## 2012-10-23 DIAGNOSIS — IMO0002 Reserved for concepts with insufficient information to code with codable children: Secondary | ICD-10-CM

## 2012-10-23 DIAGNOSIS — E785 Hyperlipidemia, unspecified: Secondary | ICD-10-CM

## 2012-10-23 DIAGNOSIS — Z91199 Patient's noncompliance with other medical treatment and regimen due to unspecified reason: Secondary | ICD-10-CM

## 2012-10-23 DIAGNOSIS — R5383 Other fatigue: Secondary | ICD-10-CM

## 2012-10-23 DIAGNOSIS — B3731 Acute candidiasis of vulva and vagina: Secondary | ICD-10-CM

## 2012-10-23 DIAGNOSIS — N3090 Cystitis, unspecified without hematuria: Secondary | ICD-10-CM

## 2012-10-23 DIAGNOSIS — Z3202 Encounter for pregnancy test, result negative: Secondary | ICD-10-CM

## 2012-10-23 DIAGNOSIS — E876 Hypokalemia: Secondary | ICD-10-CM | POA: Diagnosis present

## 2012-10-23 DIAGNOSIS — L0292 Furuncle, unspecified: Secondary | ICD-10-CM | POA: Diagnosis present

## 2012-10-23 DIAGNOSIS — L708 Other acne: Secondary | ICD-10-CM | POA: Diagnosis present

## 2012-10-23 DIAGNOSIS — L259 Unspecified contact dermatitis, unspecified cause: Secondary | ICD-10-CM | POA: Diagnosis present

## 2012-10-23 DIAGNOSIS — K089 Disorder of teeth and supporting structures, unspecified: Secondary | ICD-10-CM

## 2012-10-23 DIAGNOSIS — Z9119 Patient's noncompliance with other medical treatment and regimen: Secondary | ICD-10-CM

## 2012-10-23 DIAGNOSIS — N949 Unspecified condition associated with female genital organs and menstrual cycle: Secondary | ICD-10-CM

## 2012-10-23 LAB — POCT UA - MICROSCOPIC ONLY: Mucus, UA: NEGATIVE

## 2012-10-23 LAB — POCT URINALYSIS DIPSTICK
Bilirubin, UA: NEGATIVE
Glucose, UA: 500
Ketones, UA: >=160
Nitrite, UA: NEGATIVE
Spec Grav, UA: 1.015

## 2012-10-23 LAB — BLOOD GAS, VENOUS
Bicarbonate: 14.1 mEq/L — ABNORMAL LOW (ref 20.0–24.0)
FIO2: 0.21 %
Patient temperature: 98.6

## 2012-10-23 LAB — COMPREHENSIVE METABOLIC PANEL
Albumin: 2.8 g/dL — ABNORMAL LOW (ref 3.5–5.2)
BUN: 7 mg/dL (ref 6–23)
Chloride: 95 mEq/L — ABNORMAL LOW (ref 96–112)
Creatinine, Ser: 0.6 mg/dL (ref 0.50–1.10)
GFR calc Af Amer: >90 mL/min (ref >90)
Total Bilirubin: 0.4 mg/dL (ref 0.3–1.2)
Total Protein: 7.2 g/dL (ref 6.0–8.3)

## 2012-10-23 LAB — POCT GLYCOSYLATED HEMOGLOBIN (HGB A1C): Hemoglobin A1C: 13.7

## 2012-10-23 LAB — POCT CBC
Granulocyte percent: 87.6 %G — AB (ref 37–80)
HCT, POC: 47.7 % (ref 37.7–47.9)
MCH, POC: 30.8 pg (ref 27–31.2)
MCV: 94.7 fL (ref 80–97)
RBC: 5.04 M/uL (ref 4.04–5.48)
WBC: 13.8 10*3/uL — AB (ref 4.6–10.2)

## 2012-10-23 LAB — URINE MICROSCOPIC-ADD ON

## 2012-10-23 LAB — URINALYSIS, ROUTINE W REFLEX MICROSCOPIC
Ketones, ur: >80 mg/dL — AB
Nitrite: NEGATIVE
Protein, ur: 30 mg/dL — AB

## 2012-10-23 LAB — CBC
MCHC: 35.8 g/dL (ref 30.0–36.0)
RDW: 12.5 % (ref 11.5–15.5)

## 2012-10-23 MED ORDER — SODIUM CHLORIDE 0.9 % IV BOLUS (SEPSIS)
1000.0000 mL | Freq: Once | INTRAVENOUS | Status: AC
  Administered 2012-10-23: 1000 mL via INTRAVENOUS

## 2012-10-23 MED ORDER — INSULIN REGULAR BOLUS VIA INFUSION
0.0000 [IU] | Freq: Three times a day (TID) | INTRAVENOUS | Status: DC
  Filled 2012-10-23: qty 10

## 2012-10-23 MED ORDER — DEXTROSE 5 % IV SOLN
1.0000 g | Freq: Once | INTRAVENOUS | Status: AC
  Administered 2012-10-23: 1 g via INTRAVENOUS
  Filled 2012-10-23: qty 10

## 2012-10-23 MED ORDER — SODIUM CHLORIDE 0.9 % IV SOLN
INTRAVENOUS | Status: DC
  Administered 2012-10-24: 3.1 [IU]/h via INTRAVENOUS
  Filled 2012-10-23: qty 1

## 2012-10-23 MED ORDER — FAMOTIDINE 20 MG PO TABS
20.0000 mg | ORAL_TABLET | Freq: Once | ORAL | Status: AC
  Administered 2012-10-23: 20 mg via ORAL
  Filled 2012-10-23: qty 1

## 2012-10-23 MED ORDER — SODIUM CHLORIDE 0.9 % IV SOLN
INTRAVENOUS | Status: DC
  Administered 2012-10-24: 150 mL/h via INTRAVENOUS
  Administered 2012-10-24: 02:00:00 via INTRAVENOUS

## 2012-10-23 MED ORDER — ONDANSETRON HCL 4 MG/2ML IJ SOLN
4.0000 mg | Freq: Once | INTRAMUSCULAR | Status: DC
  Filled 2012-10-23: qty 2

## 2012-10-23 MED ORDER — METHYLPREDNISOLONE SODIUM SUCC 125 MG IJ SOLR
125.0000 mg | Freq: Once | INTRAMUSCULAR | Status: AC
  Administered 2012-10-23: 125 mg via INTRAVENOUS
  Filled 2012-10-23: qty 2

## 2012-10-23 MED ORDER — DEXTROSE 50 % IV SOLN: 25.0000 mL | INTRAVENOUS | Status: DC | PRN

## 2012-10-23 NOTE — Progress Notes (Signed)
RT attempted ABG x's 1 then RN asked me to hold off on ABG because MD may switch over to venous gas, RN to contact RT with plan of action.

## 2012-10-23 NOTE — Progress Notes (Signed)
Subjective:    Patient ID: Carrie [name], female    DOB: [birth year], 32 y.o.   MRN: [id number]  HPI 1) RASH:  Red, itchy rash on arms, legs, chest, and abdomen.  Started on LEFT thigh on Saturday.  Has spread since then.  Tried new body wash and new moisturizer on Saturday.  Threw both away.  Has been taking Benadryl daily and using HC cream + Benadryl spray.  Has been bathing with Oil of Olay since.    2) BOILS:  2 in left axilla, 1 in right axilla - since Monday.  Painful.  Used alcohol.    3) INSULIN:  Has NOT taken her insulin in 3 weeks.  ONLY checks her blood sugars 1-2 times per month.  The last sugar she can recall was one month ago and in the 200s. She doesn't like to take them because it makes her finger tips numb and she types a lot.  She has lost her job 10 days ago and is interested in a cheaper option for insulin.  When asking about her DM knowledge, she said "I understand.  I don't feel it.  I probably won't do anything until I lose a toe."    Review of Systems  Constitutional: Positive for fever (75F on Monday morning then down an hour later), appetite change (anorexia) and unexpected weight change (8.6 lb weight LOSS since April's visit).  HENT: Negative for sore throat (Positive for "itchy" throat).   Respiratory: Positive for shortness of breath (after going up stairs).   Cardiovascular: Negative for chest pain.  Gastrointestinal: Positive for nausea and constipation. Negative for vomiting, abdominal pain (or flank pain) and diarrhea.  Endocrine: Positive for polydipsia and polyuria.  Musculoskeletal: Negative for back pain.  Skin: Positive for rash.  Neurological: Positive for weakness (after going up stairs), light-headedness and numbness (bilateral feet at night).      Objective:   Physical Exam Filed Vitals:   10/23/12 1645  BP: 120/76  Pulse: 100  Temp: 98 F (36.7 C)  TempSrc: Oral  Resp: 16  Height: 5' 7.5" (1.715 m)  Weight: 137 lb 9.6 oz (62.415 kg)   SpO2: 100%   General:  WDWN female in no acute distress.  Alert & oriented x 3. Skin:  Erythematous fine 1 mm papular rash across trunk, upper and lower extremities. Axilla:  Non-erythematous, T2P, mobile lesions - one 2 cm in right axillae, two 0.5 cm in left axillae.  Cardiovascular: Tachycardia.  S1 and S2 distinct.   Respiratory:  CTA with no adventitious sounds. Abdominal:  Soft, non-tender abdomen with no visible pulsations or masses.  Bowel sounds hypoactive.   Neuro: Moves all extremities spontaneously. Gait is normal. CNII-XII grossly in tact. Psych:  Responds to questions appropriately with a normal affect.  Labs: Results for orders placed in visit on 10/23/12  POCT CBC      Result Value Range   WBC 13.8 (*) 4.6 - 10.2 K/uL   Lymph, poc 1.0  0.6 - 3.4   POC LYMPH PERCENT 7.5 (*) 10 - 50 %L   MID (cbc) 0.7  0 - 0.9   POC MID % 4.9  0 - 12 %M   POC Granulocyte 12.1 (*) 2 - 6.9   Granulocyte percent 87.6 (*) 37 - 80 %G   RBC 5.04  4.04 - 5.48 M/uL   Hemoglobin 15.5  12.2 - 16.2 g/dL   HCT, POC 47.7  37.7 - 47.9 %   MCV 94.7  80 - 97 fL   MCH, POC 30.8  27 - 31.2 pg   MCHC 32.5  31.8 - 35.4 g/dL   RDW, POC 12.5     Platelet Count, POC 416  142 - 424 K/uL   MPV 9.3  0 - 99.8 fL  GLUCOSE, POCT (MANUAL RESULT ENTRY)      Result Value Range   POC Glucose 425 (*) 70 - 99 mg/dl  POCT GLYCOSYLATED HEMOGLOBIN (HGB A1C)      Result Value Range   Hemoglobin A1C 13.7    POCT UA - MICROSCOPIC ONLY      Result Value Range   WBC, Ur, HPF, POC 10-15     RBC, urine, microscopic 10-15     Bacteria, U Microscopic trace     Mucus, UA neg     Epithelial cells, urine per micros 0-1     Crystals, Ur, HPF, POC neg     Casts, Ur, LPF, POC neg     Yeast, UA neg    POCT URINALYSIS DIPSTICK      Result Value Range   Color, UA light yellow     Clarity, UA cloudy     Glucose, UA 500     Bilirubin, UA neg     Ketones, UA >=160     Spec Grav, UA 1.015     Blood, UA moderate     pH, UA  5.5     Protein, UA 30     Urobilinogen, UA 0.2     Nitrite, UA neg     Leukocytes, UA Trace    POCT URINE PREGNANCY      Result Value Range   Preg Test, Ur Negative          Assessment & Plan:  32 yo AAF here with possible DKA, rash, axillary abscess, and pyuria  -Given the multiple factors in play this evening and her presentation the patient was transported to [facility] for inpatient treatment via EMS. -IVF -Discussed in detail with patient the risks of medication noncompliance, including loss of eyesight, limbs, dialysis, and death.  -Plan to have her axillary issues treated while at the hospital -Patient discussed with Dr. Carlota Raspberry  Patient seen and examined with PA student.  Christell Faith, PA-C 10/23/2012 7:22 PM

## 2012-10-23 NOTE — ED Notes (Signed)
CBG 289 

## 2012-10-23 NOTE — ED Notes (Signed)
Respiratory contacted to obtain ABG.

## 2012-10-23 NOTE — ED Provider Notes (Signed)
History    CSN: [id number] Arrival date & time 10/23/12  [id number]  First MD Initiated Contact with Patient 10/23/12 1945     Chief Complaint  Patient presents with  . Hyperglycemia  . Urticaria   (Consider location/radiation/quality/duration/timing/severity/associated sxs/prior Treatment) HPI Hx per PT - went to UC today for rash and boils under her arms.  She tried a new body wash 4 days ago and since that time has a itchy red rash all over including her face and palms.  Has nausea and dec appetite last few days. At [facility] her blood sugar was 430, so EMS was called, she had 1L IVFs in route. No F/C. No CP or SOB. Taking benadryl for itching. No emesis. No h/o similar reaction. Symptoms MOD in severity.   Past Medical History  Diagnosis Date  . Diabetes mellitus without complication    No past surgical history on file. Family History  Problem Relation Age of Onset  . Hyperlipidemia Mother   . Asthma Daughter   . Diabetes Maternal Grandmother   . Kidney disease Maternal Grandfather   . Kidney disease Paternal Grandmother    History  Substance Use Topics  . Smoking status: Never Smoker   . Smokeless tobacco: Never Used  . Alcohol Use: 2.0 oz/week    4 drink(s) per week   OB History   Grav Para Term Preterm Abortions TAB SAB Ect Mult Living                 Review of Systems  Constitutional: Negative for fever and chills.  HENT: Negative for neck pain and neck stiffness.   Eyes: Negative for visual disturbance.  Respiratory: Negative for shortness of breath.   Cardiovascular: Negative for chest pain.  Gastrointestinal: Positive for nausea. Negative for vomiting and abdominal pain.  Genitourinary: Negative for dysuria.  Musculoskeletal: Negative for back pain.  Skin: Positive for rash.  Neurological: Negative for headaches.  All other systems reviewed and are negative.    Allergies  Review of patient's allergies indicates no known allergies.  Home Medications    Current Outpatient Rx  Name  Route  Sig  Dispense  Refill  . diphenhydrAMINE (BENADRYL) 25 MG tablet   Oral   Take 25 mg by mouth every 6 (six) hours as needed for itching.         . gabapentin (NEURONTIN) 300 MG capsule   Oral   Take 1 capsule (300 mg total) by mouth at bedtime.   90 capsule   3   . insulin aspart protamine-insulin aspart (NOVOLOG 70/30) (70-30) 100 UNIT/ML injection   Subcutaneous   Inject 20 Units into the skin 2 (two) times daily with a meal.   10 mL   11    BP 142/87  Pulse 122  Temp(Src) 98.9 F (37.2 C) (Oral)  Resp 16  Ht 5\' 8"  (1.727 m)  Wt 137 lb (62.143 kg)  BMI 20.84 kg/m2  SpO2 100%  LMP 10/13/2012 Physical Exam  Constitutional: She is oriented to person, place, and time. She appears well-developed and well-nourished.  HENT:  Head: Normocephalic and atraumatic.  Mouth/Throat: No oropharyngeal exudate.  Dry mm  Eyes: EOM are normal. Pupils are equal, round, and reactive to light. No scleral icterus.  Neck: Neck supple.  Cardiovascular: Normal rate, regular rhythm and intact distal pulses.   Borderline tachycardic  Pulmonary/Chest: Effort normal and breath sounds normal. Stridor present. No respiratory distress. She has no wheezes.  Abdominal: Soft. Bowel sounds are normal.  She exhibits no distension. There is no tenderness.  Musculoskeletal: Normal range of motion. She exhibits no edema.  R/ L axilla with multiple small areas of tenderness and fluctuance. No drainage or pointing  Neurological: She is alert and oriented to person, place, and time.  Skin: Skin is warm and dry.  Fine erythematous rash to torso, ext, face and palms.     ED Course  Procedures (including critical care time)  Results for orders placed during the hospital encounter of 10/23/12  CBC      Result Value Range   WBC 13.6 (*) 4.0 - 10.5 K/uL   RBC 4.64  3.87 - 5.11 MIL/uL   Hemoglobin 14.4  12.0 - 15.0 g/dL   HCT 40.2  36.0 - 46.0 %   MCV 86.6  78.0 -  100.0 fL   MCH 31.0  26.0 - 34.0 pg   MCHC 35.8  30.0 - 36.0 g/dL   RDW 12.5  11.5 - 15.5 %   Platelets 352  150 - 400 K/uL  URINALYSIS, ROUTINE W REFLEX MICROSCOPIC      Result Value Range   Color, Urine YELLOW  YELLOW   APPearance CLOUDY (*) CLEAR   Specific Gravity, Urine 1.038 (*) 1.005 - 1.030   pH 5.0  5.0 - 8.0   Glucose, UA >1000 (*) NEGATIVE mg/dL   Hgb urine dipstick LARGE (*) NEGATIVE   Bilirubin Urine NEGATIVE  NEGATIVE   Ketones, ur >80 (*) NEGATIVE mg/dL   Protein, ur 30 (*) NEGATIVE mg/dL   Urobilinogen, UA 0.2  0.0 - 1.0 mg/dL   Nitrite NEGATIVE  NEGATIVE   Leukocytes, UA MODERATE (*) NEGATIVE  PREGNANCY, URINE      Result Value Range   Preg Test, Ur NEGATIVE  NEGATIVE  URINE MICROSCOPIC-ADD ON      Result Value Range   Squamous Epithelial / LPF RARE  RARE   WBC, UA 21-50  <3 WBC/hpf   RBC / HPF 11-20  <3 RBC/hpf   Bacteria, UA FEW (*) RARE  COMPREHENSIVE METABOLIC PANEL      Result Value Range   Sodium 135  135 - 145 mEq/L   Potassium 3.7  3.5 - 5.1 mEq/L   Chloride 95 (*) 96 - 112 mEq/L   CO2 16 (*) 19 - 32 mEq/L   Glucose, Bld 343 (*) 70 - 99 mg/dL   BUN 7  6 - 23 mg/dL   Creatinine, Ser 0.60  0.50 - 1.10 mg/dL   Calcium 9.1  8.4 - 10.5 mg/dL   Total Protein 7.2  6.0 - 8.3 g/dL   Albumin 2.8 (*) 3.5 - 5.2 g/dL   AST 9  0 - 37 U/L   ALT 7  0 - 35 U/L   Alkaline Phosphatase 118 (*) 39 - 117 U/L   Total Bilirubin 0.4  0.3 - 1.2 mg/dL   GFR calc non Af Amer >90  >90 mL/min   GFR calc Af Amer >90  >90 mL/min  BLOOD GAS, VENOUS      Result Value Range   FIO2 0.21     Delivery systems ROOM AIR     pH, Ven 7.235 (*) 7.250 - 7.300   pCO2, Ven 34.5 (*) 45.0 - 50.0 mmHg   Bicarbonate 14.1 (*) 20.0 - 24.0 mEq/L   TCO2 13.2  0 - 100 mmol/L   Acid-base deficit 12.3 (*) 0.0 - 2.0 mmol/L   O2 Saturation 29.1     Patient temperature 98.6  Collection site VEIN     Drawn by COLLECTED BY NURSE     Sample type VENOUS      IVFs, IV insulin, IV ABx  I  offered I/D of axillary abscesses and PT declines - she was told at the UC that they could be treated with ABx alone and she wishes to try this first. I did rec I/D  ABG declined/ VBG as above  MED consult - DR Conley Canal to admit  MDM  Mild DKA, also treated for urticarial rash  Labs, iVFs and insulin drip  MED admit    Teressa Lower, MD 10/24/12 1724

## 2012-10-23 NOTE — Progress Notes (Signed)
Labs reviewed,  patient discussed with and plan developed with Christell Faith, PA-C. Agree with assessment and plan of care per his note. Agree with ER eval for possible serum ketones, IVF and eval for underlying infection given leukocytosis, subjective fever, tachycardia, possible UTI and folliculitis. IV placed in office.

## 2012-10-23 NOTE — ED Notes (Signed)
Pt arrives by EMS for multiple complaints. Pt was sent from urgent medical due to hyperglycemia, pt also has hives to body from a body spray she used recently. Pt also has abscess under arm.

## 2012-10-23 NOTE — ED Notes (Signed)
CBG at 1950 was 305

## 2012-10-23 NOTE — ED Notes (Signed)
[id number] Expected date:<BR> Expected time:<BR> Means of arrival:<BR> Comments:<BR> EMS/32 yo female with elevated blood sugar

## 2012-10-23 NOTE — ED Notes (Addendum)
Notified EDP, Marnette Burgess, MD lab draw not crossing over to meter. Per MD,Opitz not to redraw labs.Notified CN. Per CN to contact the person who is over the lab in [place] of Care.

## 2012-10-24 DIAGNOSIS — E1110 Type 2 diabetes mellitus with ketoacidosis without coma: Secondary | ICD-10-CM

## 2012-10-24 DIAGNOSIS — L259 Unspecified contact dermatitis, unspecified cause: Secondary | ICD-10-CM | POA: Diagnosis present

## 2012-10-24 DIAGNOSIS — N390 Urinary tract infection, site not specified: Secondary | ICD-10-CM | POA: Insufficient documentation

## 2012-10-24 DIAGNOSIS — R21 Rash and other nonspecific skin eruption: Secondary | ICD-10-CM

## 2012-10-24 LAB — GLUCOSE, CAPILLARY
Glucose-Capillary: 146 mg/dL — ABNORMAL HIGH (ref 70–99)
Glucose-Capillary: 177 mg/dL — ABNORMAL HIGH (ref 70–99)
Glucose-Capillary: 179 mg/dL — ABNORMAL HIGH (ref 70–99)
Glucose-Capillary: 191 mg/dL — ABNORMAL HIGH (ref 70–99)
Glucose-Capillary: 191 mg/dL — ABNORMAL HIGH (ref 70–99)
Glucose-Capillary: 207 mg/dL — ABNORMAL HIGH (ref 70–99)
Glucose-Capillary: 249 mg/dL — ABNORMAL HIGH (ref 70–99)
Glucose-Capillary: 289 mg/dL — ABNORMAL HIGH (ref 70–99)
Glucose-Capillary: 297 mg/dL — ABNORMAL HIGH (ref 70–99)
Glucose-Capillary: 303 mg/dL — ABNORMAL HIGH (ref 70–99)
Glucose-Capillary: 304 mg/dL — ABNORMAL HIGH (ref 70–99)
Glucose-Capillary: 367 mg/dL — ABNORMAL HIGH (ref 70–99)

## 2012-10-24 LAB — BASIC METABOLIC PANEL
BUN: 10 mg/dL (ref 6–23)
BUN: 12 mg/dL (ref 6–23)
CO2: 20 mEq/L (ref 19–32)
Calcium: 8.4 mg/dL (ref 8.4–10.5)
Calcium: 7.9 mg/dL — ABNORMAL LOW (ref 8.4–10.5)
Calcium: 8.1 mg/dL — ABNORMAL LOW (ref 8.4–10.5)
Calcium: 8.5 mg/dL (ref 8.4–10.5)
Chloride: 103 mEq/L (ref 96–112)
Chloride: 105 mEq/L (ref 96–112)
Creatinine, Ser: 0.6 mg/dL (ref 0.50–1.10)
Creatinine, Ser: 0.46 mg/dL — ABNORMAL LOW (ref 0.50–1.10)
Creatinine, Ser: 0.53 mg/dL (ref 0.50–1.10)
Creatinine, Ser: 0.54 mg/dL (ref 0.50–1.10)
GFR calc Af Amer: >90 mL/min (ref >90)
GFR calc Af Amer: >90 mL/min (ref >90)
GFR calc Af Amer: >90 mL/min (ref >90)
GFR calc non Af Amer: >90 mL/min (ref >90)
GFR calc non Af Amer: >90 mL/min (ref >90)
GFR calc non Af Amer: >90 mL/min (ref >90)
GFR calc non Af Amer: >90 mL/min (ref >90)
Glucose, Bld: 263 mg/dL — ABNORMAL HIGH (ref 70–99)
Glucose, Bld: 187 mg/dL — ABNORMAL HIGH (ref 70–99)
Potassium: 3.5 mEq/L (ref 3.5–5.1)
Potassium: 3.3 mEq/L — ABNORMAL LOW (ref 3.5–5.1)
Sodium: 134 mEq/L — ABNORMAL LOW (ref 135–145)
Sodium: 134 mEq/L — ABNORMAL LOW (ref 135–145)
Sodium: 134 mEq/L — ABNORMAL LOW (ref 135–145)
Sodium: 133 mEq/L — ABNORMAL LOW (ref 135–145)

## 2012-10-24 LAB — CBC
Platelets: 398 10*3/uL (ref 150–400)
RBC: 4.66 MIL/uL (ref 3.87–5.11)
RDW: 12.5 % (ref 11.5–15.5)
WBC: 15.1 10*3/uL — ABNORMAL HIGH (ref 4.0–10.5)

## 2012-10-24 LAB — RPR: RPR Ser Ql: NONREACTIVE

## 2012-10-24 MED ORDER — DEXTROSE-NACL 5-0.45 % IV SOLN
INTRAVENOUS | Status: DC
  Administered 2012-10-24 (×2): via INTRAVENOUS

## 2012-10-24 MED ORDER — DEXTROSE 50 % IV SOLN: 50.0000 mL | Freq: Once | INTRAVENOUS | Status: DC | PRN

## 2012-10-24 MED ORDER — SODIUM CHLORIDE 0.9 % IV SOLN
INTRAVENOUS | Status: DC
  Administered 2012-10-24: 13:00:00 via INTRAVENOUS
  Filled 2012-10-24 (×3): qty 1

## 2012-10-24 MED ORDER — INSULIN REGULAR BOLUS VIA INFUSION
0.0000 [IU] | Freq: Three times a day (TID) | INTRAVENOUS | Status: DC
  Filled 2012-10-24: qty 10

## 2012-10-24 MED ORDER — HYDROXYZINE HCL 25 MG PO TABS
25.0000 mg | ORAL_TABLET | Freq: Four times a day (QID) | ORAL | Status: DC | PRN
  Administered 2012-10-24: 25 mg via ORAL
  Filled 2012-10-24: qty 1

## 2012-10-24 MED ORDER — ENOXAPARIN SODIUM 40 MG/0.4ML ~~LOC~~ SOLN
40.0000 mg | SUBCUTANEOUS | Status: DC
  Administered 2012-10-24: 40 mg via SUBCUTANEOUS
  Filled 2012-10-24 (×2): qty 0.4

## 2012-10-24 MED ORDER — GABAPENTIN 300 MG PO CAPS
300.0000 mg | ORAL_CAPSULE | Freq: Every day | ORAL | Status: DC
  Administered 2012-10-24 – 2012-10-25 (×2): 300 mg via ORAL
  Filled 2012-10-24 (×2): qty 1

## 2012-10-24 MED ORDER — SODIUM CHLORIDE 0.9 % IV SOLN: INTRAVENOUS | Status: DC

## 2012-10-24 MED ORDER — PREDNISONE 20 MG PO TABS
40.0000 mg | ORAL_TABLET | Freq: Every day | ORAL | Status: DC
  Administered 2012-10-25: 40 mg via ORAL
  Filled 2012-10-24 (×2): qty 2

## 2012-10-24 MED ORDER — DEXTROSE-NACL 5-0.45 % IV SOLN
INTRAVENOUS | Status: DC
  Administered 2012-10-24: 08:00:00 via INTRAVENOUS

## 2012-10-24 MED ORDER — SODIUM CHLORIDE 0.9 % IV SOLN
INTRAVENOUS | Status: DC
  Administered 2012-10-24 – 2012-10-25 (×3): via INTRAVENOUS

## 2012-10-24 MED ORDER — INSULIN GLARGINE 100 UNIT/ML ~~LOC~~ SOLN
10.0000 [IU] | Freq: Every day | SUBCUTANEOUS | Status: DC
  Administered 2012-10-25: 10 [IU] via SUBCUTANEOUS
  Filled 2012-10-24: qty 0.1

## 2012-10-24 MED ORDER — DIPHENHYDRAMINE HCL 50 MG PO CAPS
50.0000 mg | ORAL_CAPSULE | Freq: Three times a day (TID) | ORAL | Status: DC
  Administered 2012-10-24 – 2012-10-25 (×5): 50 mg via ORAL
  Filled 2012-10-24 (×8): qty 1

## 2012-10-24 MED ORDER — POTASSIUM CHLORIDE 10 MEQ/100ML IV SOLN
10.0000 meq | INTRAVENOUS | Status: AC
  Administered 2012-10-25 (×4): 10 meq via INTRAVENOUS
  Filled 2012-10-24 (×4): qty 100

## 2012-10-24 MED ORDER — DEXTROSE 5 % IV SOLN
1.0000 g | INTRAVENOUS | Status: DC
  Administered 2012-10-24: 1 g via INTRAVENOUS
  Filled 2012-10-24 (×2): qty 10

## 2012-10-24 MED ORDER — ZOLPIDEM TARTRATE 5 MG PO TABS
5.0000 mg | ORAL_TABLET | Freq: Once | ORAL | Status: AC
Start: 2012-10-24 — End: 2012-10-24
  Administered 2012-10-24: 5 mg via ORAL
  Filled 2012-10-24: qty 1

## 2012-10-24 MED ORDER — GLUCOSE 40 % PO GEL: 1.0000 | ORAL | Status: DC | PRN

## 2012-10-24 MED ORDER — INSULIN ASPART PROT & ASPART (70-30 MIX) 100 UNIT/ML ~~LOC~~ SUSP: 20.0000 [IU] | Freq: Two times a day (BID) | SUBCUTANEOUS | Status: DC

## 2012-10-24 MED ORDER — INSULIN ASPART 100 UNIT/ML ~~LOC~~ SOLN
0.0000 [IU] | Freq: Three times a day (TID) | SUBCUTANEOUS | Status: DC
  Administered 2012-10-25: 3 [IU] via SUBCUTANEOUS
  Administered 2012-10-25: 5 [IU] via SUBCUTANEOUS

## 2012-10-24 MED ORDER — DEXTROSE 50 % IV SOLN: 25.0000 mL | Freq: Once | INTRAVENOUS | Status: DC | PRN

## 2012-10-24 MED ORDER — DEXTROSE 50 % IV SOLN: 25.0000 mL | INTRAVENOUS | Status: DC | PRN

## 2012-10-24 MED ORDER — POTASSIUM CHLORIDE 10 MEQ/100ML IV SOLN
10.0000 meq | INTRAVENOUS | Status: AC
  Administered 2012-10-24 (×2): 10 meq via INTRAVENOUS
  Filled 2012-10-24 (×2): qty 100

## 2012-10-24 MED ORDER — DEXTROSE 50 % IV SOLN: 50.0000 mL | INTRAVENOUS | Status: DC | PRN

## 2012-10-24 MED ORDER — METHYLPREDNISOLONE SODIUM SUCC 125 MG IJ SOLR
80.0000 mg | Freq: Once | INTRAMUSCULAR | Status: AC
  Administered 2012-10-24: 80 mg via INTRAVENOUS
  Filled 2012-10-24: qty 1.28

## 2012-10-24 NOTE — Progress Notes (Signed)
IV infiltrated. Two unsuccessful attempts made, IV team aware patient on glucose stabilizer and needs new IV stie.

## 2012-10-24 NOTE — Progress Notes (Addendum)
Inpatient Diabetes Program Recommendations  AACE/ADA: New Consensus Statement on Inpatient Glycemic Control (2013)  Target Ranges:  Prepandial:   less than 140 mg/dL      Peak postprandial:   less than 180 mg/dL (1-2 hours)      Critically ill patients:  140 - 180 mg/dL   Reason for Visit: Spoke to patient regarding insulin options.  There are vouchers available to make Novolog 70/30 pens more affordable ($25 month).  She anticipates losing her insurance since she recently lost her job. She can obtain Reli-on 70/30 from [facility] for 26$ per vial.  Needs close follow-up with PCP.  Discussed [facility] with patient and Dr. Wynetta Emery.  She is interested in establishing care there since she will likely be losing insurance.       Note: Will follow-up later this PM.    Gave patient [facility] for care voucher for insulin (25$ month).  Also called to get appointment for patient at [facility].  Left Message. Explained to patient that she cannot skip insulin doses and the importance of taking consistently.   10/24/12  Gave patient coupons for insulin 70/30 mix.  Again, she anticipates losing insurance and will need PCP and cheaper insulin option.

## 2012-10-24 NOTE — Progress Notes (Signed)
Dr. Olevia Bowens on floor aware of non-pitting edema to feet and c/o numbness to feet, also that patient on d51/2ns for blood sugar less than 250. MD states that bicarb needs to be above 20 and cbgs in normal range patient will be getting steriods for rash.

## 2012-10-24 NOTE — H&P (Signed)
[facility] History and Physical  SCHRONDA BLICHARZ [id number] DOB: [birth year] DOA: 10/23/2012  Referring physician: Marnette Burgess PCP: Mack Hook, MD   Chief Complaint: rash  HPI: Carrie [name] is a 32 y.o. female who presented to urgent care with a diffuse rash after using a new body wash and body spray several days ago. She's been taking Benadryl but continues to have severe itching. She also has noticed several boils in her axilla. No fevers or chills. She has been feeling weak and nauseated. She ran out of her insulin about 3 weeks ago. She recently lost her job and is unable to afford her insulin. She has been able to keep down liquids, but not solids. No fevers or chills. In the urgent care center, her blood glucose was noted to be above 400 so she was sent to the emergency room with concerns of DKA. In the ER, she does have an anion gap of 24. She also has a white blood cell count of 13 and was found to have a UTI. Upon further questioning she does report some vague dysuria.  Review of Systems: Systems reviewed and as above otherwise negative.  Past Medical History  Diagnosis Date  . Diabetes mellitus without complication    History reviewed. No pertinent past surgical history. Social History:  reports that she has never smoked. She has never used smokeless tobacco. She reports that she drinks about 2.0 ounces of alcohol per week. She reports that she does not use illicit drugs.  No Known Allergies  Family History  Problem Relation Age of Onset  . Hyperlipidemia Mother   . Asthma Daughter   . Diabetes Maternal Grandmother   . Kidney disease Maternal Grandfather   . Kidney disease Paternal Grandmother     Prior to Admission medications   Medication Sig Start Date End Date Taking? Authorizing Provider  diphenhydrAMINE (BENADRYL) 25 MG tablet Take 25 mg by mouth every 6 (six) hours as needed for itching.   Yes Historical Provider, MD  ferrous sulfate 325 (65 FE) MG tablet  Take 325 mg by mouth once.   Yes Historical Provider, MD  gabapentin (NEURONTIN) 300 MG capsule Take 1 capsule (300 mg total) by mouth at bedtime. 07/18/12  Yes Robyn Haber, MD  insulin aspart protamine-insulin aspart (NOVOLOG 70/30) (70-30) 100 UNIT/ML injection Inject 20 Units into the skin 2 (two) times daily with a meal. 07/18/12  Yes Robyn Haber, MD  vitamin C (ASCORBIC ACID) 500 MG tablet Take 500 mg by mouth once.   Yes Historical Provider, MD  zinc gluconate 50 MG tablet Take 50 mg by mouth once.   Yes Historical Provider, MD   Physical Exam: Filed Vitals:   10/23/12 1943 10/23/12 2145  BP: 142/87   Pulse: 122 110  Temp: 98.9 F (37.2 C)   TempSrc: Oral   Resp: 16   Height: 5\' 8"  (1.727 m)   Weight: 62.143 kg (137 lb)   SpO2: 100% 100%  BP 116/76  Pulse 102  Temp(Src) 98.3 F (36.8 C) (Oral)  Resp 16  Ht 5\' 8"  (1.727 m)  Wt 62.143 kg (137 lb)  BMI 20.84 kg/m2  SpO2 100%  LMP 10/13/2012  General Appearance:    Alert, cooperative, noted to be scratching periodically, appears stated age  Head:    Normocephalic, without obvious abnormality, atraumatic  Eyes:    PERRL, conjunctiva/corneas clear, EOM's intact, fundi    benign, both eyes     Nose:   Nares normal, septum midline, mucosa  normal, no drainage    or sinus tenderness  Throat:   Lips, mucosa, and tongue normal; teeth and gums normal  Neck:   Supple, symmetrical, trachea midline, no adenopathy;    thyroid:  no enlargement/tenderness/nodules; no carotid   bruit or JVD  Back:     Symmetric, no curvature, ROM normal, no CVA tenderness  Lungs:     Clear to auscultation bilaterally, respirations unlabored  Chest Wall:    No tenderness or deformity   Heart:    Regular rate and rhythm, S1 and S2 normal, no murmur, rub   or gallop     Abdomen:     Soft, non-tender, bowel sounds active all four quadrants,    no masses, no organomegaly  Genitalia:   deferred  Rectal:   deferred  Extremities:   Extremities normal,  atraumatic, no cyanosis or edema  Pulses:   2+ and symmetric all extremities  Skin:   erythematous papular and macular rash noted on her arms strong and legs. On her face are multiple papules she reports from her acne and skin picking  Lymph nodes:   Cervical, supraclavicular, and axillary nodes normal  Neurologic:   CNII-XII intact, normal strength, sensation and reflexes    throughout    Psychiatric: Normal affect.  Labs on Admission:  Basic Metabolic Panel:  Recent Labs Lab 10/23/12 2035  NA 135  K 3.7  CL 95*  CO2 16*  GLUCOSE 343*  BUN 7  CREATININE 0.60  CALCIUM 9.1   Liver Function Tests:  Recent Labs Lab 10/23/12 2035  AST 9  ALT 7  ALKPHOS 118*  BILITOT 0.4  PROT 7.2  ALBUMIN 2.8*   No results found for this basename: LIPASE, AMYLASE,  in the last 168 hours No results found for this basename: AMMONIA,  in the last 168 hours CBC:  Recent Labs Lab 10/23/12 1808 10/23/12 2035  WBC 13.8* 13.6*  HGB 15.5 14.4  HCT 47.7 40.2  MCV 94.7 86.6  PLT  --  352   Cardiac Enzymes: No results found for this basename: CKTOTAL, CKMB, CKMBINDEX, TROPONINI,  in the last 168 hours  BNP (last 3 results) No results found for this basename: PROBNP,  in the last 8760 hours CBG: No results found for this basename: GLUCAP,  in the last 168 hours  Radiological Exams on Admission: No results found.   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 She has been given saline in the emergency room and an insulin drip will be started. She will be admitted. Serial basic metabolic panels. Will consult care management to assist with outpatient medications.    Carbuncle and furuncle of bilateral axillae: Will order warm compresses.    ACNE VULGARIS    UTI (urinary tract infection): Patient has received Rocephin which I will continue    Contact dermatitis: Patient received Solu-Medrol in the emergency room. Will order Atarax.   Code Status:  full Family Communication: none at bedside Disposition Plan: home  Time spent: 60 minutes  [facility] Pager [phone]  If 7PM-7AM, please contact night-coverage [email or web address] Password [facility] 10/24/2012, 12:34 AM

## 2012-10-24 NOTE — Progress Notes (Signed)
Contacted pharmacy to make sure potassium is compatible with insulin drip pharmacy confirmed the two are compatible. Janeth Rase, RN

## 2012-10-24 NOTE — Care Management Note (Signed)
    Page 1 of 1   10/24/2012     12:17:49 PM   CARE MANAGEMENT NOTE 10/24/2012  Patient:  Carrie [name], Carrie [name]   Account Number:  [id number]  Date Initiated:  10/24/2012  Documentation initiated by:  Sunday Spillers  Subjective/Objective Assessment:   32 yo female admitted with DKA. PTA lived at home alone.     Action/Plan:   Home when stable   Anticipated DC Date:  10/28/2012   Anticipated DC Plan:  [place]  CM consult  Medication Assistance      Choice offered to / List presented to:             Status of service:  In process, will continue to follow Medicare Important Message given?   (If response is "NO", the following Medicare IM given date fields will be blank) Date Medicare IM given:   Date Additional Medicare IM given:    Discharge Disposition:  HOME/SELF CARE  Per UR Regulation:  Reviewed for med. necessity/level of care/duration of stay  If discussed at [place] of Stay Meetings, dates discussed:    Comments:  10-24-12 Sunday Spillers RN CM [id number] Recieved a referral for medication assistance as patient has lost her job. Spoke with patient at bedside. She states she has insurance coverage for now but wants assistance with lower cost insulin options. She is currenlty waiting to be seen by diabetes coordinator, recommended that she discuss regimin with her. Patient states she currently has medication coverage but knows the insulin pens are not affordable for her. Will f/u after seen by diabetes coordinator.

## 2012-10-24 NOTE — Progress Notes (Signed)
[facility] PROGRESS NOTE Assessment/Plan: *DKA, type 2 - cont. insulin drip until HCO > 20. Advance diet. - DKA most likely due to ongoing infection. - check b-met q4hrs, CBG's q 1hr.  UTI (urinary tract infection) - rocephin UC pending. - afebrile.  Morbiliform rash: - high dose benadryl and steroids. - pt relates she started using a new cream and 24 hrs after the fist use she started developing a this rash. It will not explained, the rash on her tongue. She has no tongue swelling or lips. Or blistering lessions.  Carbuncle and furuncle of unspecified site - warm compresses.    Code Status: full  Family Communication: none at bedside  Disposition Plan: home    Consultants:  none  Procedures:  none  Antibiotics:  Rocephin 7.9.2014  HPI/Subjective: She feels like sand paper in her tongue.  Objective: Filed Vitals:   10/23/12 2145 10/24/12 0101 10/24/12 0141 10/24/12 0545  BP:  116/66 116/76 99/69  Pulse: 110 108 102 98  Temp:  99 F (37.2 C) 98.3 F (36.8 C) 97.7 F (36.5 C)  TempSrc:  Oral Oral Oral  Resp:  14 16 16   Height:      Weight:      SpO2: 100% 100% 100% 100%    Intake/Output Summary (Last 24 hours) at 10/24/12 0904 Last data filed at 10/24/12 0602  Gross per 24 hour  Intake   1420 ml  Output      0 ml  Net   1420 ml   Filed Weights   10/23/12 1943  Weight: 62.143 kg (137 lb)    Exam:  General: Alert, awake, oriented x3, in no acute distress.  HEENT: No bruits, no goiter.  Heart: Regular rate and rhythm, without murmurs, rubs, gallops.  Lungs: Good air movement, bilateral air movement.  Abdomen: Soft, nontender, nondistended, positive bowel sounds.  Neuro: Grossly intact, nonfocal. Skin: morbilliform rah diffuse most prominent on check and externalities.   Data Reviewed: Basic Metabolic Panel:  Recent Labs Lab 10/23/12 2035 10/24/12 0242 10/24/12 0515  NA 135 134* 134*  K 3.7 3.9 3.5  CL 95* 100 103  CO2 16*  12* 14*  GLUCOSE 343* 341* 263*  BUN 7 8 9   CREATININE 0.60 0.60 0.52  CALCIUM 9.1 8.4 7.9*   Liver Function Tests:  Recent Labs Lab 10/23/12 2035  AST 9  ALT 7  ALKPHOS 118*  BILITOT 0.4  PROT 7.2  ALBUMIN 2.8*   No results found for this basename: LIPASE, AMYLASE,  in the last 168 hours No results found for this basename: AMMONIA,  in the last 168 hours CBC:  Recent Labs Lab 10/23/12 1808 10/23/12 2035 10/24/12 0242  WBC 13.8* 13.6* 15.1*  HGB 15.5 14.4 13.9  HCT 47.7 40.2 41.0  MCV 94.7 86.6 88.0  PLT  --  352 398   Cardiac Enzymes: No results found for this basename: CKTOTAL, CKMB, CKMBINDEX, TROPONINI,  in the last 168 hours BNP (last 3 results) No results found for this basename: PROBNP,  in the last 8760 hours CBG:  Recent Labs Lab 10/24/12 0445 10/24/12 0544 10/24/12 0639 10/24/12 0743 10/24/12 0839  GLUCAP 249* 232* 205* 174* 191*    No results found for this or any previous visit (from the past 240 hour(s)).   Studies: No results found.  Scheduled Meds: . cefTRIAXone (ROCEPHIN)  IV  1 g Intravenous Q24H  . enoxaparin (LOVENOX) injection  40 mg Subcutaneous Q24H  . insulin regular  0-10 Units  Intravenous TID WC  . ondansetron  4 mg Intravenous Once   Continuous Infusions: . sodium chloride 150 mL/hr at 10/24/12 0639  . sodium chloride    . dextrose 5 % and 0.45% NaCl    . insulin (NOVOLIN-R) infusion       Charlynne Cousins  [facility] Pager [phone]. If 8PM-8AM, please contact night-coverage at [email or web address], password [facility] 10/24/2012, 9:04 AM  LOS: 1 day

## 2012-10-25 DIAGNOSIS — E1065 Type 1 diabetes mellitus with hyperglycemia: Secondary | ICD-10-CM

## 2012-10-25 DIAGNOSIS — IMO0002 Reserved for concepts with insufficient information to code with codable children: Secondary | ICD-10-CM

## 2012-10-25 LAB — BASIC METABOLIC PANEL
CO2: 22 mEq/L (ref 19–32)
Calcium: 8.6 mg/dL (ref 8.4–10.5)
Chloride: 104 mEq/L (ref 96–112)
Potassium: 4 mEq/L (ref 3.5–5.1)
Sodium: 133 mEq/L — ABNORMAL LOW (ref 135–145)

## 2012-10-25 LAB — GLUCOSE, CAPILLARY
Glucose-Capillary: 179 mg/dL — ABNORMAL HIGH (ref 70–99)
Glucose-Capillary: 225 mg/dL — ABNORMAL HIGH (ref 70–99)
Glucose-Capillary: 276 mg/dL — ABNORMAL HIGH (ref 70–99)

## 2012-10-25 MED ORDER — INSULIN ASPART 100 UNIT/ML ~~LOC~~ SOLN
10.0000 [IU] | Freq: Once | SUBCUTANEOUS | Status: AC
  Administered 2012-10-25: 10 [IU] via SUBCUTANEOUS

## 2012-10-25 MED ORDER — INSULIN GLARGINE 100 UNIT/ML ~~LOC~~ SOLN
10.0000 [IU] | Freq: Two times a day (BID) | SUBCUTANEOUS | Status: DC
  Filled 2012-10-25: qty 0.1

## 2012-10-25 MED ORDER — INSULIN ASPART PROT & ASPART (70-30 MIX) 100 UNIT/ML ~~LOC~~ SUSP: 30.0000 [IU] | Freq: Two times a day (BID) | SUBCUTANEOUS | Status: DC

## 2012-10-25 MED ORDER — INSULIN GLARGINE 100 UNIT/ML ~~LOC~~ SOLN
10.0000 [IU] | Freq: Two times a day (BID) | SUBCUTANEOUS | Status: DC
  Administered 2012-10-25 (×2): 10 [IU] via SUBCUTANEOUS
  Filled 2012-10-25 (×3): qty 0.1

## 2012-10-25 MED ORDER — DIPHENHYDRAMINE HCL 25 MG PO TABS
25.0000 mg | ORAL_TABLET | Freq: Four times a day (QID) | ORAL | Status: DC | PRN
Start: 2012-10-25 — End: 2012-11-21

## 2012-10-25 MED ORDER — INSULIN ASPART 100 UNIT/ML ~~LOC~~ SOLN: 20.0000 [IU] | Freq: Once | SUBCUTANEOUS | Status: DC

## 2012-10-25 MED ORDER — PREDNISONE 20 MG PO TABS: 40.0000 mg | ORAL_TABLET | Freq: Every day | ORAL | Status: DC

## 2012-10-25 MED ORDER — POTASSIUM CHLORIDE CRYS ER 20 MEQ PO TBCR
40.0000 meq | EXTENDED_RELEASE_TABLET | Freq: Two times a day (BID) | ORAL | Status: AC
  Administered 2012-10-25 (×2): 40 meq via ORAL
  Filled 2012-10-25 (×2): qty 2

## 2012-10-25 NOTE — Discharge Summary (Signed)
Physician Discharge Summary  Carrie [name] [id number] DOB: 20-Jun-1980 DOA: 10/23/2012  PCP: Mack Hook, MD  Admit date: 10/23/2012 Discharge date: 10/25/2012  Time spent: 35 minutes  Recommendations for Outpatient Follow-up:  1. Follow up with PCP and titrate insulin as tolerate it.  Discharge Diagnoses:  Principal Problem:   DKA, type 2 Active Problems:   Carbuncle and furuncle of unspecified site   ACNE VULGARIS   Contact dermatitis   Discharge Condition: stable  Diet recommendation: carb modified  Filed Weights   10/23/12 1943  Weight: 62.143 kg (137 lb)    History of present illness:  32 y.o. female who presented to urgent care with a diffuse rash after using a new body wash and body spray several days ago. She's been taking Benadryl but continues to have severe itching. She also has noticed several boils in her axilla. No fevers or chills. She has been feeling weak and nauseated. She ran out of her insulin about 3 weeks ago. She recently lost her job and is unable to afford her insulin. She has been able to keep down liquids, but not solids. No fevers or chills. In the urgent care center, her blood glucose was noted to be above 400 so she was sent to the emergency room with concerns of DKA. In the ER, she does have an anion gap of 24. She also has a white blood cell count of 13 and was found to have a UTI. Upon further questioning she does report some vague dysuria.   Hospital Course:  DKA, type 2 : - admitted started on insulin drip. - cont. insulin drip until HCO > 20. Advance diet.  - transition to insulin subcutaneous. - pregnancy test negative. - treated empirically with rocephin UC showed no growth. Antibiotics stopped. - remained afebrile, no leukocytosis.   Morbiliform rash:  - high dose benadryl and steroids.  - rash improved by next day. Cont steroids for 2 additional days. - pt relates she started using a new cream and 24 hrs after the fist use she  started developing a this rash. It will not explained, the rash on her tongue. She has no tongue swelling or lips. Or blistering lessions.   Carbuncle and furuncle of unspecified site  - warm compresses.  Hypokalemia: -  Repleted, resolved.  Procedures:  None  Consultations:  none  Discharge Exam: Filed Vitals:   10/24/12 2200 10/25/12 0058 10/25/12 0200 10/25/12 0600  BP: 117/69 111/75 111/75 116/76  Pulse: 104 108 108 106  Temp: 98.5 F (36.9 C) 98.2 F (36.8 C) 98.2 F (36.8 C) 98.7 F (37.1 C)  TempSrc: Oral Oral Oral Oral  Resp: 20 16 18 18   Height:      Weight:      SpO2: 100% 100% 100% 100%    General:  A&O x3 Cardiovascular: RRR Respiratory: good air movement CTA B/L  Discharge Instructions      Discharge Orders   Future Appointments Provider Department Dept Phone   10/31/2012 1:15 PM [facility] Covering Provider [place] [phone]   Future Orders Complete By Expires     Diet - low sodium heart healthy  As directed     Increase activity slowly  As directed         Medication List         diphenhydrAMINE 25 MG tablet  Commonly known as:  BENADRYL  Take 1 tablet (25 mg total) by mouth every 6 (six) hours as needed for itching.  ferrous sulfate 325 (65 FE) MG tablet  Take 325 mg by mouth once.     gabapentin 300 MG capsule  Commonly known as:  NEURONTIN  Take 1 capsule (300 mg total) by mouth at bedtime.     insulin aspart protamine- aspart (70-30) 100 UNIT/ML injection  Commonly known as:  NOVOLOG MIX 70/30  Inject 20 Units into the skin 2 (two) times daily with a meal.     predniSONE 20 MG tablet  Commonly known as:  DELTASONE  Take 2 tablets (40 mg total) by mouth daily with breakfast.     vitamin C 500 MG tablet  Commonly known as:  ASCORBIC ACID  Take 500 mg by mouth once.     zinc gluconate 50 MG tablet  Take 50 mg by mouth once.       No Known Allergies Follow-up Information   Follow up  with [facility], MD In 2 weeks. (hospital follow up)    Contact information:   [place] [id number] [phone]        The results of significant diagnostics from this hospitalization (including imaging, microbiology, ancillary and laboratory) are listed below for reference.    Significant Diagnostic Studies: No results found.  Microbiology: No results found for this or any previous visit (from the past 240 hour(s)).   Labs: Basic Metabolic Panel:  Recent Labs Lab 10/24/12 0242 10/24/12 0515 10/24/12 0853 10/24/12 1315 10/24/12 2243  NA 134* 134* 133* 134* 133*  K 3.9 3.5 3.3* 3.3* 2.9*  CL 100 103 103 105 103  CO2 12* 14* 17* 20 21  GLUCOSE 341* 263* 218* 157* 187*  BUN 8 9 10 11 12   CREATININE 0.60 0.52 0.46* 0.53 0.54  CALCIUM 8.4 7.9* 8.0* 8.1* 8.5   Liver Function Tests:  Recent Labs Lab 10/23/12 2035  AST 9  ALT 7  ALKPHOS 118*  BILITOT 0.4  PROT 7.2  ALBUMIN 2.8*   No results found for this basename: LIPASE, AMYLASE,  in the last 168 hours No results found for this basename: AMMONIA,  in the last 168 hours CBC:  Recent Labs Lab 10/23/12 1808 10/23/12 2035 10/24/12 0242  WBC 13.8* 13.6* 15.1*  HGB 15.5 14.4 13.9  HCT 47.7 40.2 41.0  MCV 94.7 86.6 88.0  PLT  --  352 398   Cardiac Enzymes: No results found for this basename: CKTOTAL, CKMB, CKMBINDEX, TROPONINI,  in the last 168 hours BNP: BNP (last 3 results) No results found for this basename: PROBNP,  in the last 8760 hours CBG:  Recent Labs Lab 10/24/12 2150 10/24/12 2254 10/24/12 2349 10/25/12 0055 10/25/12 0223  GLUCAP 191* 152* 113* 74 179*       Signed:  FELIZ ORTIZ, ABRAHAM  [facility] 10/25/2012, 8:11 AM

## 2012-10-25 NOTE — Progress Notes (Signed)
Spoke to Diabetes Coordinator, about any futher home needs for patient and coordinator aware of current blood sugars, will continue to monitor and contact them as needed.

## 2012-10-25 NOTE — Progress Notes (Signed)
Dr. Olevia Bowens on floor aware blood sugar 225 this am, MD states he increased patients lantus, and that rash still present but improved. Patient for discharge today.

## 2012-10-25 NOTE — Progress Notes (Signed)
Inpatient Diabetes Program Recommendations  AACE/ADA: New Consensus Statement on Inpatient Glycemic Control (2013)  Target Ranges:  Prepandial:   less than 140 mg/dL      Peak postprandial:   less than 180 mg/dL (1-2 hours)      Critically ill patients:  140 - 180 mg/dL  Results for [name], [name] (MRN [id number]) as of 10/25/2012 15:46  Ref. Range 10/25/2012 08:10 10/25/2012 12:29 10/25/2012 14:48  Glucose-Capillary Latest Range: 70-99 mg/dL 225 (H) 276 (H) 385 (H)    Inpatient Diabetes Program Recommendations Correction (SSI): consider adding Novolog correction scale TID during steroid therapy  CBGs are climbing with steroid therapy.  Recommend sending patient home with Novolog correction scale and 70/30. Thank you  Raoul Pitch BSN, RN,CDE Inpatient Diabetes Coordinator [phone] (team pager)

## 2012-10-25 NOTE — Progress Notes (Signed)
Ended insulin drip at 1am  lantus given at midnight  Patient current cbg 74 Provided a snack for night coverage  Emiya Loomer, RN

## 2012-10-25 NOTE — Progress Notes (Signed)
Paged Dr. Olevia Bowens that patient CBG rechecked was 283 will get patient ready to go home.

## 2012-10-25 NOTE — Progress Notes (Signed)
Spoke to Dr. Olevia Bowens states to give patient 10 units of novolog, recheck blood sugar in an hour and then ok for discharge.

## 2012-10-25 NOTE — Progress Notes (Addendum)
Dr. Olevia Bowens called back states he has seen blood sugar 276 at 1230, informed him patient eating at this time, MD states to give novolog 10 units. Also MD agrrees that patient continued numbness to toes is due to neuropathy, patient to continue pm novolog.

## 2012-10-25 NOTE — Progress Notes (Signed)
Paged Dr. Olevia Bowens that patient last CBG 276 dose he want me to proceed with discharge. Awaiting callback

## 2012-10-25 NOTE — Progress Notes (Signed)
Spoke to Dr. Olevia Bowens informed him that blood sugar rechecked 45 min after giving novolog 10 units, blood sugar 385, MD states to check blood sugar q1h x3 and to call him when blood sugar is below 200.

## 2012-10-25 NOTE — Progress Notes (Signed)
Spoke to Dr. Olevia Bowens, MD states he is aware of blood sugar 283 on recheck and should continue to come down, patient ok for discharge. Patient given RX for prednisone and benadryl. States understanding of follow up appt, change in insulin regimen, and discharge instructions.

## 2012-10-26 ENCOUNTER — Ambulatory Visit (INDEPENDENT_AMBULATORY_CARE_PROVIDER_SITE_OTHER): Payer: No Typology Code available for payment source | Admitting: Family Medicine

## 2012-10-26 VITALS — BP 106/64 | HR 106 | Temp 97.9°F | Resp 16 | Ht 68.0 in | Wt 156.2 lb

## 2012-10-26 DIAGNOSIS — E119 Type 2 diabetes mellitus without complications: Secondary | ICD-10-CM

## 2012-10-26 DIAGNOSIS — E109 Type 1 diabetes mellitus without complications: Secondary | ICD-10-CM

## 2012-10-26 DIAGNOSIS — L0291 Cutaneous abscess, unspecified: Secondary | ICD-10-CM

## 2012-10-26 DIAGNOSIS — G589 Mononeuropathy, unspecified: Secondary | ICD-10-CM

## 2012-10-26 DIAGNOSIS — G629 Polyneuropathy, unspecified: Secondary | ICD-10-CM

## 2012-10-26 DIAGNOSIS — L0390 Cellulitis, unspecified: Secondary | ICD-10-CM

## 2012-10-26 DIAGNOSIS — R609 Edema, unspecified: Secondary | ICD-10-CM

## 2012-10-26 DIAGNOSIS — R600 Localized edema: Secondary | ICD-10-CM

## 2012-10-26 MED ORDER — FUROSEMIDE 20 MG PO TABS: 20.0000 mg | ORAL_TABLET | Freq: Every day | ORAL | Status: DC

## 2012-10-26 MED ORDER — GABAPENTIN 300 MG PO CAPS: 300.0000 mg | ORAL_CAPSULE | Freq: Every day | ORAL | Status: DC

## 2012-10-26 MED ORDER — DOXYCYCLINE HYCLATE 100 MG PO TABS: 100.0000 mg | ORAL_TABLET | Freq: Two times a day (BID) | ORAL | Status: DC

## 2012-10-26 MED ORDER — INSULIN NPH (HUMAN) (ISOPHANE) 100 UNIT/ML ~~LOC~~ SUSP: SUBCUTANEOUS | Status: DC

## 2012-10-26 NOTE — Progress Notes (Signed)
Is a 32 year old insulin-dependent diabetic developed a rash on her legs, back, torso about a week ago and was seen in the emergency room. She was given prednisone and her diabetes point totally out-of-control such that she needed IV insulin and hospitalization. He was unclear why she developed a rash in the first place, but it persisted to the present. It's associated with edema of her feet.  Currently the rash is scaly, erythematous and very pruritic.  Patient denies chest pain, shortness of breath, sore throat, joint pain, new medication, insect bites. She also denies fever or vision changes.  Objective: No acute distress Patient has marked erythema of both legs from just above the ankles all the way up to the groin area. She has mild erythema in her lower back as well.  Patient has nodular tender areas under her axilla on both sides. She admits to shaving both the pubic area and under her arms in the past with recurrent boils in those areas.  Patient is unable to afford the medicine she was prescribed in the emergency room.  Objective: No acute distress Axillae: 1/2 cm nodules both sides which are tender and subcutaneous. 1-2+ pretibial edema bilaterally Marked erythema from just above the ankles all the way up to the thigh and involving the low back as well. No respiratory distress Patient's skin has multiple hyperpigmented areas suggest the past boils.  Assessment: Cellulitis bilateral legs of uncertain etiology, pedal edema, uncontrolled diabetes  Plan:Diabetes - Plan: insulin NPH (HUMULIN N) 100 UNIT/ML injection  Cellulitis - Plan: doxycycline (VIBRA-TABS) 100 MG tablet  Type 1 diabetes mellitus - Plan: gabapentin (NEURONTIN) 300 MG capsule  Neuropathy - Plan: gabapentin (NEURONTIN) 300 MG capsule  Pedal edema - Plan: furosemide (LASIX) 20 MG tablet  Signed, Robyn Haber, MD

## 2012-10-26 NOTE — Progress Notes (Signed)
Pt discharged to home discharge instructions and prescriptions along with handouts given by [facility] during day shift. Pt verbalized understanding of discharge information. Pt stable. Pt transported by Janeth Rase, RN  IV removed by Joellen Jersey and documented by Janeth Rase, RN. Levy Sjogren, RN

## 2012-10-26 NOTE — Patient Instructions (Signed)
Return Wednesday after 4 pm or Friday at [facility] clinic

## 2012-10-27 LAB — URINE CULTURE

## 2012-10-28 LAB — GLUCOSE, CAPILLARY: Glucose-Capillary: 228 mg/dL — ABNORMAL HIGH (ref 70–99)

## 2012-10-29 NOTE — Progress Notes (Signed)
Discharge summary sent to payer through [facility]  

## 2012-10-30 ENCOUNTER — Ambulatory Visit (INDEPENDENT_AMBULATORY_CARE_PROVIDER_SITE_OTHER): Payer: No Typology Code available for payment source | Admitting: Family Medicine

## 2012-10-30 VITALS — BP 108/68 | HR 110 | Temp 98.5°F | Resp 16 | Ht 68.0 in | Wt 152.4 lb

## 2012-10-30 DIAGNOSIS — R600 Localized edema: Secondary | ICD-10-CM

## 2012-10-30 DIAGNOSIS — E119 Type 2 diabetes mellitus without complications: Secondary | ICD-10-CM

## 2012-10-30 DIAGNOSIS — L02411 Cutaneous abscess of right axilla: Secondary | ICD-10-CM

## 2012-10-30 DIAGNOSIS — R609 Edema, unspecified: Secondary | ICD-10-CM

## 2012-10-30 DIAGNOSIS — IMO0002 Reserved for concepts with insufficient information to code with codable children: Secondary | ICD-10-CM

## 2012-10-30 LAB — POCT CBC
Granulocyte percent: 77 %G (ref 37–80)
HCT, POC: 44 % (ref 37.7–47.9)
Hemoglobin: 13.8 g/dL (ref 12.2–16.2)
Lymph, poc: 1.7 (ref 0.6–3.4)
MCH, POC: 30.3 pg (ref 27–31.2)
MCHC: 31.4 g/dL — AB (ref 31.8–35.4)
MCV: 96.5 fL (ref 80–97)
MID (cbc): 0.7 (ref 0–0.9)
MPV: 8.2 fL (ref 0–99.8)
POC Granulocyte: 7.9 — AB (ref 2–6.9)
POC LYMPH PERCENT: 16.3 %L (ref 10–50)
POC MID %: 6.7 %M (ref 0–12)
Platelet Count, POC: 361 10*3/uL (ref 142–424)
RBC: 4.56 M/uL (ref 4.04–5.48)
RDW, POC: 13.3 %
WBC: 10.3 10*3/uL — AB (ref 4.6–10.2)

## 2012-10-30 MED ORDER — TORSEMIDE 20 MG PO TABS: 20.0000 mg | ORAL_TABLET | Freq: Every day | ORAL | Status: DC

## 2012-10-30 NOTE — Addendum Note (Signed)
Addended by: Orion Crook on: 10/30/2012 07:06 PM   Modules accepted: Orders

## 2012-10-30 NOTE — Progress Notes (Signed)
Is a 32 year old woman who is unemployed and has one child. She comes in with her mother today for several issues. First of all she's had persistent edema since her last visit and in fact has gained 15 pounds in the last several weeks.  Patient's initial problems he began of she's a different kind of soap and had a severe skin reaction. She's given prednisone and her blood sugar went out of control. She became dehydrated and was given fluids in the emergency room and has not been able to get rid of the edema since.  Patient's blood sugar has improved and she is now running around 100.  Patient's other problem is that she has boils under both of her arms. She started on doxycycline several days ago and the left axilla is better but the right axilla still multiply painful even though it is also decreased in size.  Patient has a diffuse exfoliating rash over back and torso, legs, and arms.  Objective: No acute distress Axillary abscesses are smaller but still tender on the right, measuring about 1 cm in diameter. Chest: Clear Heart: Regular, rapid about 100, no gallop or murmur Extremities show 3+ edema to just above the knees. Progress skin: Scaly exfoliating rash on her back, torso, arms and legs  Assessment: Improved diabetic control, persistent boils, persistent edema.  Plan:Pedal edema - Plan: Comprehensive metabolic panel, POCT CBC  Abscess of axilla, right - Plan: POCT CBC  Diabetes Recheck in 3 days.  Signed, Robyn Haber, MD

## 2012-10-31 ENCOUNTER — Inpatient Hospital Stay: Payer: No Typology Code available for payment source

## 2012-10-31 LAB — COMPREHENSIVE METABOLIC PANEL
ALT: 20 U/L (ref 0–35)
AST: 19 U/L (ref 0–37)
Albumin: 3.2 g/dL — ABNORMAL LOW (ref 3.5–5.2)
Alkaline Phosphatase: 88 U/L (ref 39–117)
BUN: 16 mg/dL (ref 6–23)
CO2: 28 mEq/L (ref 19–32)
Calcium: 9.4 mg/dL (ref 8.4–10.5)
Chloride: 99 mEq/L (ref 96–112)
Creat: 0.69 mg/dL (ref 0.50–1.10)
Glucose, Bld: 269 mg/dL — ABNORMAL HIGH (ref 70–99)
Potassium: 4.1 mEq/L (ref 3.5–5.3)
Sodium: 135 mEq/L (ref 135–145)
Total Bilirubin: 0.4 mg/dL (ref 0.3–1.2)
Total Protein: 6.3 g/dL (ref 6.0–8.3)

## 2012-11-02 ENCOUNTER — Telehealth: Payer: Self-pay

## 2012-11-02 ENCOUNTER — Other Ambulatory Visit: Payer: Self-pay | Admitting: Family Medicine

## 2012-11-02 DIAGNOSIS — R600 Localized edema: Secondary | ICD-10-CM

## 2012-11-02 MED ORDER — METOLAZONE 2.5 MG PO TABS: 2.5000 mg | ORAL_TABLET | Freq: Every day | ORAL | Status: DC

## 2012-11-02 NOTE — Telephone Encounter (Signed)
PATIENT WANTS ADVICE FROM DR. Joseph Art ABOUT STILL HAVING  FLUID IN HER LEGS (BOTH FEET) OVER A WEEK. FLUID PILL IS NOT WORKING AND NOW IT HAS GONE TO HER CALF'S AND FEET STILL SWOLLEN. PLEASE ADVISE. SHE HAS AN APPOINTMENT WITH DR. L ON Monday. WANTS A STRONGER FLUID PILL  BEST:  [phone]

## 2012-11-21 ENCOUNTER — Emergency Department (HOSPITAL_COMMUNITY)
Admission: EM | Admit: 2012-11-21 | Discharge: 2012-11-22 | Disposition: A | Discharge status: Home / Self Care | Payer: No Typology Code available for payment source | Attending: Emergency Medicine | Admitting: Emergency Medicine

## 2012-11-21 ENCOUNTER — Emergency Department (HOSPITAL_COMMUNITY): Payer: No Typology Code available for payment source

## 2012-11-21 DIAGNOSIS — R05 Cough: Secondary | ICD-10-CM | POA: Insufficient documentation

## 2012-11-21 DIAGNOSIS — R609 Edema, unspecified: Secondary | ICD-10-CM | POA: Insufficient documentation

## 2012-11-21 DIAGNOSIS — R059 Cough, unspecified: Secondary | ICD-10-CM | POA: Insufficient documentation

## 2012-11-21 DIAGNOSIS — R0602 Shortness of breath: Secondary | ICD-10-CM | POA: Insufficient documentation

## 2012-11-21 DIAGNOSIS — R0600 Dyspnea, unspecified: Secondary | ICD-10-CM

## 2012-11-21 DIAGNOSIS — R600 Localized edema: Secondary | ICD-10-CM

## 2012-11-21 DIAGNOSIS — N390 Urinary tract infection, site not specified: Secondary | ICD-10-CM | POA: Insufficient documentation

## 2012-11-21 DIAGNOSIS — R0609 Other forms of dyspnea: Secondary | ICD-10-CM | POA: Insufficient documentation

## 2012-11-21 DIAGNOSIS — M2550 Pain in unspecified joint: Secondary | ICD-10-CM | POA: Insufficient documentation

## 2012-11-21 DIAGNOSIS — E119 Type 2 diabetes mellitus without complications: Secondary | ICD-10-CM | POA: Insufficient documentation

## 2012-11-21 DIAGNOSIS — R5383 Other fatigue: Secondary | ICD-10-CM | POA: Insufficient documentation

## 2012-11-21 DIAGNOSIS — R0989 Other specified symptoms and signs involving the circulatory and respiratory systems: Secondary | ICD-10-CM | POA: Insufficient documentation

## 2012-11-21 DIAGNOSIS — R110 Nausea: Secondary | ICD-10-CM | POA: Insufficient documentation

## 2012-11-21 DIAGNOSIS — G589 Mononeuropathy, unspecified: Secondary | ICD-10-CM | POA: Insufficient documentation

## 2012-11-21 DIAGNOSIS — Z79899 Other long term (current) drug therapy: Secondary | ICD-10-CM | POA: Insufficient documentation

## 2012-11-21 DIAGNOSIS — IMO0002 Reserved for concepts with insufficient information to code with codable children: Secondary | ICD-10-CM | POA: Insufficient documentation

## 2012-11-21 DIAGNOSIS — R5381 Other malaise: Secondary | ICD-10-CM | POA: Insufficient documentation

## 2012-11-21 DIAGNOSIS — Z794 Long term (current) use of insulin: Secondary | ICD-10-CM | POA: Insufficient documentation

## 2012-11-21 DIAGNOSIS — G629 Polyneuropathy, unspecified: Secondary | ICD-10-CM

## 2012-11-21 DIAGNOSIS — L02411 Cutaneous abscess of right axilla: Secondary | ICD-10-CM

## 2012-11-21 LAB — URINE MICROSCOPIC-ADD ON

## 2012-11-21 LAB — URINALYSIS, ROUTINE W REFLEX MICROSCOPIC
Bilirubin Urine: NEGATIVE
Glucose, UA: >1000 mg/dL — AB
Ketones, ur: NEGATIVE mg/dL
pH: 7 (ref 5.0–8.0)

## 2012-11-21 LAB — BASIC METABOLIC PANEL
CO2: 24 mEq/L (ref 19–32)
Calcium: 9.4 mg/dL (ref 8.4–10.5)
Creatinine, Ser: 0.53 mg/dL (ref 0.50–1.10)
Glucose, Bld: 252 mg/dL — ABNORMAL HIGH (ref 70–99)

## 2012-11-21 LAB — CBC
Hemoglobin: 12.9 g/dL (ref 12.0–15.0)
MCH: 30.5 pg (ref 26.0–34.0)
MCV: 91.3 fL (ref 78.0–100.0)
RBC: 4.23 MIL/uL (ref 3.87–5.11)

## 2012-11-21 LAB — D-DIMER, QUANTITATIVE: D-Dimer, Quant: <0.27 ug/mL-FEU (ref 0.00–0.48)

## 2012-11-21 MED ORDER — SULFAMETHOXAZOLE-TMP DS 800-160 MG PO TABS
1.0000 | ORAL_TABLET | Freq: Once | ORAL | Status: AC
  Administered 2012-11-21: 1 via ORAL
  Filled 2012-11-21: qty 1

## 2012-11-21 MED ORDER — MORPHINE SULFATE 4 MG/ML IJ SOLN
4.0000 mg | Freq: Once | INTRAMUSCULAR | Status: AC
  Administered 2012-11-21: 4 mg via INTRAVENOUS

## 2012-11-21 MED ORDER — AMITRIPTYLINE HCL 25 MG PO TABS
25.0000 mg | ORAL_TABLET | Freq: Once | ORAL | Status: AC
  Administered 2012-11-21: 25 mg via ORAL
  Filled 2012-11-21: qty 1

## 2012-11-21 MED ORDER — ACETAMINOPHEN 325 MG PO TABS
650.0000 mg | ORAL_TABLET | Freq: Once | ORAL | Status: AC
  Administered 2012-11-21: 650 mg via ORAL
  Filled 2012-11-21: qty 2

## 2012-11-21 MED ORDER — TRAMADOL HCL 50 MG PO TABS: 100.0000 mg | ORAL_TABLET | Freq: Four times a day (QID) | ORAL | Status: DC | PRN

## 2012-11-21 MED ORDER — ONDANSETRON 8 MG PO TBDP
8.0000 mg | ORAL_TABLET | Freq: Once | ORAL | Status: AC
  Administered 2012-11-21: 8 mg via ORAL
  Filled 2012-11-21: qty 1

## 2012-11-21 MED ORDER — SULFAMETHOXAZOLE-TRIMETHOPRIM 800-160 MG PO TABS: 1.0000 | ORAL_TABLET | Freq: Two times a day (BID) | ORAL | Status: DC

## 2012-11-21 MED ORDER — LIDOCAINE-EPINEPHRINE 2 %-1:100000 IJ SOLN
20.0000 mL | Freq: Once | INTRAMUSCULAR | Status: AC
  Administered 2012-11-21: 20 mL via INTRADERMAL

## 2012-11-21 MED ORDER — TRAMADOL HCL 50 MG PO TABS
100.0000 mg | ORAL_TABLET | Freq: Once | ORAL | Status: AC
  Administered 2012-11-21: 100 mg via ORAL
  Filled 2012-11-21: qty 2

## 2012-11-21 MED ORDER — AMITRIPTYLINE HCL 25 MG PO TABS: ORAL_TABLET | ORAL | Status: DC

## 2012-11-21 MED ORDER — MORPHINE SULFATE 4 MG/ML IJ SOLN
4.0000 mg | Freq: Once | INTRAMUSCULAR | Status: DC
  Filled 2012-11-21: qty 1

## 2012-11-21 NOTE — ED Provider Notes (Signed)
Pt was seen by me, I and D performed by PA  Janice Norrie, MD 11/21/12 2321

## 2012-11-21 NOTE — ED Notes (Signed)
Pt was admitted to hospital 3.5 weeks ago for DKA. Came back today with multiple complaints. C/o skin abscess under her axillae, and neuropathy, dizziness, weakness, and nausea.

## 2012-11-21 NOTE — ED Provider Notes (Signed)
R axillary abscess I&D by me per Dr. Johnsie Kindred request.     INCISION AND DRAINAGE Performed by: Domenic Moras Consent: Verbal consent obtained. Risks and benefits: risks, benefits and alternatives were discussed Type: abscess  Body area:  R axillary  Anesthesia: local infiltration  Incision was made with a scalpel.  Local anesthetic: lidocaine 2% w epinephrine  Anesthetic total: 3 ml  Complexity: complex Blunt dissection to break up loculations  Drainage: purulent  Drainage amount: moderate  Packing material: 1/4 in iodoform gauze  Patient tolerance: Patient tolerated the procedure well with no immediate complications.     Domenic Moras, PA-C 11/21/12 2312

## 2012-11-21 NOTE — ED Provider Notes (Signed)
CSN: [id number]     Arrival date & time 11/21/12  1708 History     First MD Initiated Contact with Patient 11/21/12 1721     No chief complaint on file.  (Consider location/radiation/quality/duration/timing/severity/associated sxs/prior Treatment) HPI Patient states she was admitted to the hospital 3-1/2 weeks ago for swelling and peripheral neuropathy pain. She states she gained weight from 138 pounds to 157 pounds. She states as soon as she was discharged swelling got worse and got up into her thighs. She reports since then her primary care doctor's had her on 3 different types of fluid medications and finally the Zaroxolyn seems to have helped the most. She states she's been short of breath for the past 2 weeks but denies chest pain or wheezing. She states she has had a mild cough that started over the past week. She states the cough is dry and she's not having a fever. She's had nausea without vomiting. She states she's not having abdominal bloating. She's been feeling weak and states her grips are weak and she now has pain in her neck shoulder elbow wrists knees and most of her joints. She denies any swelling of the joints. She states the pain is aching.    PCP Dr Joseph Art  Past Medical History  Diagnosis Date  . Diabetes mellitus without complication    No past surgical history on file. Family History  Problem Relation Age of Onset  . Hyperlipidemia Mother   . Asthma Daughter   . Diabetes Maternal Grandmother   . Kidney disease Maternal Grandfather   . Kidney disease Paternal Grandmother    History  Substance Use Topics  . Smoking status: Never Smoker   . Smokeless tobacco: Never Used  . Alcohol Use: 2.0 oz/week    4 drink(s) per week     Comment: 2-3 a week   unemployed  OB History   Grav Para Term Preterm Abortions TAB SAB Ect Mult Living                 Review of Systems  All other systems reviewed and are negative.    Allergies  Review of patient's  allergies indicates no known allergies.  Home Medications   Current Outpatient Rx  Name  Route  Sig  Dispense  Refill  . acetaminophen (TYLENOL) 650 MG CR tablet   Oral   Take 1,300 mg by mouth at bedtime as needed for pain.         Marland Kitchen gabapentin (NEURONTIN) 300 MG capsule   Oral   Take 1 capsule (300 mg total) by mouth at bedtime.   90 capsule   3   . insulin NPH-regular (NOVOLIN 70/30) (70-30) 100 UNIT/ML injection   Subcutaneous   Inject 30 Units into the skin 2 (two) times daily with a meal.         . metolazone (ZAROXOLYN) 2.5 MG tablet   Oral   Take 2.5 mg by mouth every other day.         . Multiple Vitamin (MULTIVITAMIN WITH MINERALS) TABS tablet   Oral   Take 1 tablet by mouth daily.          BP 115/67  Pulse 106  Temp(Src) 98.5 F (36.9 C) (Oral)  Resp 16  SpO2 100%  LMP 11/05/2012  Vital signs normal except tachycardia  Physical Exam  Nursing note and vitals reviewed. Constitutional: She is oriented to person, place, and time. She appears well-developed and well-nourished.  Non-toxic appearance. She does  not appear ill. No distress.  HENT:  Head: Normocephalic and atraumatic.  Right Ear: External ear normal.  Left Ear: External ear normal.  Nose: Nose normal. No mucosal edema or rhinorrhea.  Mouth/Throat: Oropharynx is clear and moist and mucous membranes are normal. No dental abscesses or edematous.  Eyes: Conjunctivae and EOM are normal. Pupils are equal, round, and reactive to light.  Neck: Normal range of motion and full passive range of motion without pain. Neck supple.  Cardiovascular: Normal rate, regular rhythm and normal heart sounds.  Exam reveals no gallop and no friction rub.   No murmur heard. Pulmonary/Chest: Effort normal and breath sounds normal. No respiratory distress. She has no wheezes. She has no rhonchi. She has no rales. She exhibits no tenderness and no crepitus.  Abdominal: Soft. Normal appearance and bowel sounds are  normal. She exhibits no distension. There is no tenderness. There is no rebound and no guarding.  Musculoskeletal: Normal range of motion. She exhibits no edema and no tenderness.  Moves all extremities well. Her legs appear swollen however there is no pitting edema noted.  Neurological: She is alert and oriented to person, place, and time. She has normal strength. No cranial nerve deficit.  Skin: Skin is warm, dry and intact. No rash noted. No erythema. No pallor.  Pt has a large area of swelling in her right axilla that is painful.   Psychiatric: She has a normal mood and affect. Her speech is normal and behavior is normal. Her mood appears not anxious.    ED Course   Procedures (including critical care time)  Pt states she has lost her insurance and can't afford her neurotin. Requesting new medications.   Abscess drained by PA Rona Ravens.   Results for orders placed during the hospital encounter of 11/21/12  GLUCOSE, CAPILLARY      Result Value Range   Glucose-Capillary 238 (*) 70 - 99 mg/dL  CBC      Result Value Range   WBC 7.2  4.0 - 10.5 K/uL   RBC 4.23  3.87 - 5.11 MIL/uL   Hemoglobin 12.9  12.0 - 15.0 g/dL   HCT 38.6  36.0 - 46.0 %   MCV 91.3  78.0 - 100.0 fL   MCH 30.5  26.0 - 34.0 pg   MCHC 33.4  30.0 - 36.0 g/dL   RDW 12.5  11.5 - 15.5 %   Platelets 349  150 - 400 K/uL  BASIC METABOLIC PANEL      Result Value Range   Sodium 133 (*) 135 - 145 mEq/L   Potassium 5.9 (*) 3.5 - 5.1 mEq/L   Chloride 98  96 - 112 mEq/L   CO2 24  19 - 32 mEq/L   Glucose, Bld 252 (*) 70 - 99 mg/dL   BUN 17  6 - 23 mg/dL   Creatinine, Ser 0.53  0.50 - 1.10 mg/dL   Calcium 9.4  8.4 - 10.5 mg/dL   GFR calc non Af Amer >90  >90 mL/min   GFR calc Af Amer >90  >90 mL/min  SEDIMENTATION RATE      Result Value Range   Sed Rate 36 (*) 0 - 22 mm/hr  URINALYSIS, ROUTINE W REFLEX MICROSCOPIC      Result Value Range   Color, Urine YELLOW  YELLOW   APPearance CLOUDY (*) CLEAR   Specific Gravity,  Urine 1.035 (*) 1.005 - 1.030   pH 7.0  5.0 - 8.0   Glucose, UA >1000 (*)  NEGATIVE mg/dL   Hgb urine dipstick SMALL (*) NEGATIVE   Bilirubin Urine NEGATIVE  NEGATIVE   Ketones, ur NEGATIVE  NEGATIVE mg/dL   Protein, ur NEGATIVE  NEGATIVE mg/dL   Urobilinogen, UA 0.2  0.0 - 1.0 mg/dL   Nitrite NEGATIVE  NEGATIVE   Leukocytes, UA SMALL (*) NEGATIVE  PRO B NATRIURETIC PEPTIDE      Result Value Range   Pro B Natriuretic peptide (BNP) 25.3  0 - 125 pg/mL  TROPONIN I      Result Value Range   Troponin I <0.30  <0.30 ng/mL  D-DIMER, QUANTITATIVE      Result Value Range   D-Dimer, Quant <0.27  0.00 - 0.48 ug/mL-FEU  URINE MICROSCOPIC-ADD ON      Result Value Range   Squamous Epithelial / LPF RARE  RARE   WBC, UA 11-20  <3 WBC/hpf   Urine-Other FEW YEAST     Laboratory interpretation all normal except mildly elevated sedimentation rate, hyperglycemia, elevated potassium (hemolyzed), UTI   Dg Chest 2 View  11/21/2012   *RADIOLOGY REPORT*  Clinical Data: Shortness of breath.  CHEST - 2 VIEW  Comparison: None.  Findings: Trachea is midline.  Heart size normal.  Lungs are clear. No pleural fluid.  IMPRESSION: No acute findings.   Original Report Authenticated By: Lorin Picket, M.D.    Date: 11/21/2012  Rate: 104  Rhythm: sinus tachycardia  QRS Axis: normal  Intervals: normal  ST/T Wave abnormalities: normal  Conduction Disutrbances:none  Narrative Interpretation:   Old EKG Reviewed: none available     1. Neuropathy   2. Pedal edema   3. Abscess of axilla, right   4. Dyspnea   5. UTI (urinary tract infection)    New Prescriptions   AMITRIPTYLINE (ELAVIL) 25 MG TABLET    Take 1 po qhs x 3 days then take 2 po qhs   SULFAMETHOXAZOLE-TRIMETHOPRIM (SEPTRA DS) 800-160 MG PER TABLET    Take 1 tablet by mouth 2 (two) times daily.   TRAMADOL (ULTRAM) 50 MG TABLET    Take 2 tablets (100 mg total) by mouth every 6 (six) hours as needed for pain.     Plan discharge   Rolland Porter, MD,  [place], MD 11/21/12 2312

## 2012-12-05 ENCOUNTER — Other Ambulatory Visit: Payer: Self-pay | Admitting: Family Medicine

## 2012-12-05 MED ORDER — TRAMADOL HCL 50 MG PO TABS: 100.0000 mg | ORAL_TABLET | Freq: Four times a day (QID) | ORAL | Status: DC | PRN

## 2012-12-05 MED ORDER — AMITRIPTYLINE HCL 25 MG PO TABS: ORAL_TABLET | ORAL | Status: DC

## 2013-04-15 ENCOUNTER — Other Ambulatory Visit: Payer: Self-pay | Admitting: Family Medicine

## 2013-04-16 ENCOUNTER — Emergency Department (HOSPITAL_COMMUNITY)
Admission: EM | Admit: 2013-04-16 | Discharge: 2013-04-17 | Disposition: A | Discharge status: Home / Self Care | Payer: Self-pay | Attending: Emergency Medicine | Admitting: Emergency Medicine

## 2013-04-16 ENCOUNTER — Encounter (HOSPITAL_COMMUNITY): Payer: Self-pay | Admitting: Emergency Medicine

## 2013-04-16 ENCOUNTER — Emergency Department (HOSPITAL_COMMUNITY): Payer: Self-pay

## 2013-04-16 DIAGNOSIS — Z3490 Encounter for supervision of normal pregnancy, unspecified, unspecified trimester: Secondary | ICD-10-CM

## 2013-04-16 DIAGNOSIS — R0602 Shortness of breath: Secondary | ICD-10-CM | POA: Insufficient documentation

## 2013-04-16 DIAGNOSIS — R000 Tachycardia, unspecified: Secondary | ICD-10-CM | POA: Insufficient documentation

## 2013-04-16 DIAGNOSIS — O24919 Unspecified diabetes mellitus in pregnancy, unspecified trimester: Secondary | ICD-10-CM | POA: Insufficient documentation

## 2013-04-16 DIAGNOSIS — Z79899 Other long term (current) drug therapy: Secondary | ICD-10-CM | POA: Insufficient documentation

## 2013-04-16 DIAGNOSIS — R5381 Other malaise: Secondary | ICD-10-CM | POA: Insufficient documentation

## 2013-04-16 DIAGNOSIS — E1142 Type 2 diabetes mellitus with diabetic polyneuropathy: Secondary | ICD-10-CM | POA: Insufficient documentation

## 2013-04-16 DIAGNOSIS — Z794 Long term (current) use of insulin: Secondary | ICD-10-CM | POA: Insufficient documentation

## 2013-04-16 DIAGNOSIS — O9989 Other specified diseases and conditions complicating pregnancy, childbirth and the puerperium: Secondary | ICD-10-CM | POA: Insufficient documentation

## 2013-04-16 DIAGNOSIS — R079 Chest pain, unspecified: Secondary | ICD-10-CM | POA: Insufficient documentation

## 2013-04-16 DIAGNOSIS — O209 Hemorrhage in early pregnancy, unspecified: Secondary | ICD-10-CM | POA: Insufficient documentation

## 2013-04-16 DIAGNOSIS — E1149 Type 2 diabetes mellitus with other diabetic neurological complication: Secondary | ICD-10-CM | POA: Insufficient documentation

## 2013-04-16 HISTORY — DX: Type 2 diabetes mellitus with diabetic neuropathy, unspecified: E11.40

## 2013-04-16 LAB — CBC WITH DIFFERENTIAL/PLATELET
Basophils Absolute: 0 10*3/uL (ref 0.0–0.1)
Basophils Relative: 0 % (ref 0–1)
Eosinophils Absolute: 0.1 10*3/uL (ref 0.0–0.7)
Eosinophils Relative: 1 % (ref 0–5)
Lymphs Abs: 1.9 10*3/uL (ref 0.7–4.0)
MCH: 30.9 pg (ref 26.0–34.0)
MCV: 89.4 fL (ref 78.0–100.0)
Neutro Abs: 5.5 10*3/uL (ref 1.7–7.7)
Platelets: 314 10*3/uL (ref 150–400)
RBC: 4.17 MIL/uL (ref 3.87–5.11)
RDW: 12.6 % (ref 11.5–15.5)
WBC: 8 10*3/uL (ref 4.0–10.5)

## 2013-04-16 LAB — URINE MICROSCOPIC-ADD ON

## 2013-04-16 LAB — BASIC METABOLIC PANEL
CO2: 24 mEq/L (ref 19–32)
Calcium: 9.8 mg/dL (ref 8.4–10.5)
Creatinine, Ser: 0.62 mg/dL (ref 0.50–1.10)
GFR calc non Af Amer: >90 mL/min (ref >90)
Glucose, Bld: 174 mg/dL — ABNORMAL HIGH (ref 70–99)
Potassium: 3.9 mEq/L (ref 3.7–5.3)
Sodium: 133 mEq/L — ABNORMAL LOW (ref 137–147)

## 2013-04-16 LAB — URINALYSIS, ROUTINE W REFLEX MICROSCOPIC
Glucose, UA: >1000 mg/dL — AB
Ketones, ur: NEGATIVE mg/dL
Nitrite: NEGATIVE
Protein, ur: 100 mg/dL — AB
Specific Gravity, Urine: 1.042 — ABNORMAL HIGH (ref 1.005–1.030)

## 2013-04-16 LAB — D-DIMER, QUANTITATIVE: D-Dimer, Quant: <0.27 ug/mL-FEU (ref 0.00–0.48)

## 2013-04-16 NOTE — ED Notes (Signed)
Pt c/o central CP onset 1.5-2 weeks ago, with SHOB. Pt also states she is having irregular periods onset in September after taking Plan B in September and October. Pt is concerned her pain medication she takes on a regular basis is masking a more serious issue. A & O, NAD

## 2013-04-16 NOTE — ED Notes (Signed)
Pelvic cart and equipment at bedside

## 2013-04-16 NOTE — ED Provider Notes (Signed)
CSN: [id number]     Arrival date & time 04/16/13  1912 History   First MD Initiated Contact with Patient 04/16/13 2103     Chief Complaint  Patient presents with  . Chest Pain   (Consider location/radiation/quality/duration/timing/severity/associated sxs/prior Treatment) HPI Comments: Patient presents with chest pain. She states it's been gone on about 2 weeks. It's worse with walking up and down the stairs and with deep inspiration. She describes it as a sharp pain in the Center of her chest. Also worse with movement. She does have some shortness of breath on exertion. She has some chronic swelling in her legs from a neuropathy which is unchanged from baseline. She denies any calf tenderness. She denies he fevers or chills. She denies any cough or chest congestion. She denies a history of heart problems in the past. She does say that she's had some irregular periods since the summer. She's taking Plan B 2 times once in September and once in October.  Patient is a 32 y.o. female presenting with chest pain.  Chest Pain Associated symptoms: fatigue and shortness of breath   Associated symptoms: no abdominal pain, no back pain, no cough, no diaphoresis, no dizziness, no fever, no headache, no nausea, no numbness, not vomiting and no weakness     Past Medical History  Diagnosis Date  . Diabetes mellitus without complication   . Diabetic neuropathy    History reviewed. No pertinent past surgical history. Family History  Problem Relation Age of Onset  . Hyperlipidemia Mother   . Asthma Daughter   . Diabetes Maternal Grandmother   . Kidney disease Maternal Grandfather   . Kidney disease Paternal Grandmother    History  Substance Use Topics  . Smoking status: Never Smoker   . Smokeless tobacco: Never Used  . Alcohol Use: 2.0 oz/week    4 drink(s) per week     Comment: 2-3 a week   OB History   Grav Para Term Preterm Abortions TAB SAB Ect Mult Living                 Review of  Systems  Constitutional: Positive for fatigue. Negative for fever, chills and diaphoresis.  HENT: Negative for congestion, rhinorrhea and sneezing.   Eyes: Negative.   Respiratory: Positive for shortness of breath. Negative for cough and chest tightness.   Cardiovascular: Positive for chest pain. Negative for leg swelling.  Gastrointestinal: Negative for nausea, vomiting, abdominal pain, diarrhea and blood in stool.  Genitourinary: Positive for vaginal bleeding. Negative for frequency, hematuria, flank pain and difficulty urinating.  Musculoskeletal: Negative for arthralgias and back pain.  Skin: Negative for rash.  Neurological: Negative for dizziness, speech difficulty, weakness, numbness and headaches.    Allergies  Review of patient's allergies indicates no known allergies.  Home Medications   Current Outpatient Rx  Name  Route  Sig  Dispense  Refill  . amitriptyline (ELAVIL) 50 MG tablet   Oral   Take 1 tablet (50 mg total) by mouth at bedtime. PATIENT NEEDS OFFICE VISIT FOR ADDITIONAL REFILLS   30 tablet   0   . ibuprofen (ADVIL,MOTRIN) 200 MG tablet   Oral   Take 600 mg by mouth every 6 (six) hours as needed for cramping.         . insulin NPH-regular (NOVOLIN 70/30) (70-30) 100 UNIT/ML injection   Subcutaneous   Inject 30 Units into the skin 2 (two) times daily with a meal.         .  traMADol (ULTRAM) 50 MG tablet   Oral   Take 2 tablets (100 mg total) by mouth every 6 (six) hours as needed for pain.   50 tablet   1   . Prenatal Vit-Fe Fumarate-FA (PRENATAL MULTIVITAMIN) TABS tablet   Oral   Take 1 tablet by mouth daily at 12 noon.   30 tablet   0    BP 122/78  Pulse 121  Temp(Src) 99.8 F (37.7 C)  Resp 18  Ht 5\' 8"  (1.727 m)  Wt 150 lb (68.04 kg)  BMI 22.81 kg/m2  SpO2 100% Physical Exam  Constitutional: She is oriented to person, place, and time. She appears well-developed and well-nourished.  HENT:  Head: Normocephalic and atraumatic.  Eyes:  Pupils are equal, round, and reactive to light.  Neck: Normal range of motion. Neck supple.  Cardiovascular: Normal rate, regular rhythm and normal heart sounds.   Pulmonary/Chest: Effort normal and breath sounds normal. No respiratory distress. She has no wheezes. She has no rales. She exhibits no tenderness.  Abdominal: Soft. Bowel sounds are normal. There is no tenderness. There is no rebound and no guarding.  Musculoskeletal: Normal range of motion. She exhibits no edema.  Lymphadenopathy:    She has no cervical adenopathy.  Neurological: She is alert and oriented to person, place, and time.  Skin: Skin is warm and dry. No rash noted.  Psychiatric: She has a normal mood and affect.    ED Course  Procedures (including critical care time) Labs Review Results for orders placed during the hospital encounter of 04/16/13  WET PREP, GENITAL      Result Value Range   Yeast Wet Prep HPF POC NONE SEEN  NONE SEEN   Trich, Wet Prep NONE SEEN  NONE SEEN   Clue Cells Wet Prep HPF POC RARE (*) NONE SEEN   WBC, Wet Prep HPF POC MANY (*) NONE SEEN  CBC WITH DIFFERENTIAL      Result Value Range   WBC 8.0  4.0 - 10.5 K/uL   RBC 4.17  3.87 - 5.11 MIL/uL   Hemoglobin 12.9  12.0 - 15.0 g/dL   HCT 37.3  36.0 - 46.0 %   MCV 89.4  78.0 - 100.0 fL   MCH 30.9  26.0 - 34.0 pg   MCHC 34.6  30.0 - 36.0 g/dL   RDW 12.6  11.5 - 15.5 %   Platelets 314  150 - 400 K/uL   Neutrophils Relative % 69  43 - 77 %   Neutro Abs 5.5  1.7 - 7.7 K/uL   Lymphocytes Relative 24  12 - 46 %   Lymphs Abs 1.9  0.7 - 4.0 K/uL   Monocytes Relative 6  3 - 12 %   Monocytes Absolute 0.5  0.1 - 1.0 K/uL   Eosinophils Relative 1  0 - 5 %   Eosinophils Absolute 0.1  0.0 - 0.7 K/uL   Basophils Relative 0  0 - 1 %   Basophils Absolute 0.0  0.0 - 0.1 K/uL  BASIC METABOLIC PANEL      Result Value Range   Sodium 133 (*) 137 - 147 mEq/L   Potassium 3.9  3.7 - 5.3 mEq/L   Chloride 97  96 - 112 mEq/L   CO2 24  19 - 32 mEq/L    Glucose, Bld 174 (*) 70 - 99 mg/dL   BUN 14  6 - 23 mg/dL   Creatinine, Ser 0.62  0.50 - 1.10 mg/dL  Calcium 9.8  8.4 - 10.5 mg/dL   GFR calc non Af Amer >90  >90 mL/min   GFR calc Af Amer >90  >90 mL/min  D-DIMER, QUANTITATIVE      Result Value Range   D-Dimer, Quant <0.27  0.00 - 0.48 ug/mL-FEU  URINALYSIS, ROUTINE W REFLEX MICROSCOPIC      Result Value Range   Color, Urine YELLOW  YELLOW   APPearance TURBID (*) CLEAR   Specific Gravity, Urine 1.042 (*) 1.005 - 1.030   pH 7.0  5.0 - 8.0   Glucose, UA >1000 (*) NEGATIVE mg/dL   Hgb urine dipstick LARGE (*) NEGATIVE   Bilirubin Urine NEGATIVE  NEGATIVE   Ketones, ur NEGATIVE  NEGATIVE mg/dL   Protein, ur 100 (*) NEGATIVE mg/dL   Urobilinogen, UA 0.2  0.0 - 1.0 mg/dL   Nitrite NEGATIVE  NEGATIVE   Leukocytes, UA MODERATE (*) NEGATIVE  PREGNANCY, URINE      Result Value Range   Preg Test, Ur POSITIVE (*) NEGATIVE  HCG, QUANTITATIVE, PREGNANCY      Result Value Range   hCG, Beta Chain, Quant, S 36191 (*) <5 mIU/mL  URINE MICROSCOPIC-ADD ON      Result Value Range   WBC, UA TOO NUMEROUS TO COUNT  <3 WBC/hpf   RBC / HPF TOO NUMEROUS TO COUNT  <3 RBC/hpf   Urine-Other FIELD OBSCURED BY WBC'S    ABO/RH      Result Value Range   ABO/RH(D) O POS     No results found.   Imaging Review US Ob Limited  04/16/2013   CLINICAL DATA:  Lower abdominal cramping.  Irregular menses.  EXAM: LIMITED OBSTETRIC ULTRASOUND  FINDINGS: Number of Fetuses: 1  Heart Rate:  153 bpm  Movement: Yes  Presentation: Transverse presentation. The fetal head is to the maternal right side.  Placental Location: Placenta is posterior.  Previa: No previa. The placenta appears to be low-lying, measuring about 1.2 cm from the cervical os. However, there is a contraction present in this may be artifactual. Followup on subsequent imaging is recommended.  Amniotic Fluid (Subjective):  Within normal limits.  BPD:  2.6cm 14w  4d  MATERNAL FINDINGS:  Cervix:  Cervical  length measures 4.4 cm.  Cervix is closed.  Uterus/Adnexae:  No abnormality visualized.  IMPRESSION: Single intrauterine pregnancy. Limited measurements obtained suggest an estimated gestational age of 103 weeks 4 days but elective comprehensive OB ultrasound study should be obtained for best evaluation. No specific complication is identified.  This exam is performed on an emergent basis and does not comprehensively evaluate fetal size, dating, or anatomy; follow-up complete OB US should be considered if further fetal assessment is warranted.   Electronically Signed   By: Lucienne Capers M.D.   On: 04/16/2013 23:57    EKG Interpretation    Date/Time:  Wednesday April 16 2013 20:12:39 EST Ventricular Rate:  118 PR Interval:  125 QRS Duration: 70 QT Interval:  281 QTC Calculation: 394 R Axis:   56 Text Interpretation:  Sinus tachycardia Probable left atrial enlargement Borderline T abnormalities, anterior leads since last tracing no significant change Confirmed by Herman Fiero  MD, Elynn Patteson ([id number]) on 04/16/2013 9:19:43 PM            MDM   1. Pregnancy   2. Chest pain     Patient presents with chest pain. Her EKG did not show any ischemic changes. She has no other symptoms that would be concerning for acute coronary syndrome. She  did have a positive pregnancy test. She's currently awaiting a pelvic ultrasound. Pelvic exam showed no bleeding and a closed cervix with some thick white discharge. She does remain mildly tachycardic with heart rate around 112. Her d-dimer is negative and she has no hypoxia. On review her records noted that she's been mildly tachycardic on almost every one of her visits over the last several months to her primary care provider. Naturally the etiology of this. She's not anemic. Her blood glucose is not remarkably elevated.  No evidence of ectopic pregnancy.  Will start on PNV, refer to ob/gyn.  She is not symptomatic for a UTI, so will not treat, pending  culture.    Malvin Johns, MD 04/17/13 [phone]

## 2013-04-17 LAB — WET PREP, GENITAL
Trich, Wet Prep: NONE SEEN
Yeast Wet Prep HPF POC: NONE SEEN

## 2013-04-17 LAB — ABO/RH: ABO/RH(D): O POS

## 2013-04-17 LAB — GC/CHLAMYDIA PROBE AMP
CT Probe RNA: NEGATIVE
GC Probe RNA: NEGATIVE

## 2013-04-17 MED ORDER — PRENATAL MULTIVITAMIN CH: 1.0000 | ORAL_TABLET | Freq: Every day | ORAL | Status: DC
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
## 2013-04-21 LAB — URINE CULTURE: Colony Count: >=100000

## 2013-04-22 ENCOUNTER — Telehealth (HOSPITAL_COMMUNITY): Payer: Self-pay | Admitting: *Deleted

## 2013-04-22 NOTE — ED Notes (Signed)
Post ED Visit - Positive Culture Follow-up: Successful Patient Follow-Up  Culture assessed and recommendations reviewed by: []  Wes [place], Pharm.D., BCPS [x]  Heide Guile, Pharm.D., BCPS []  Alycia Rossetti, Pharm.D., BCPS []  [place], Pharm.D., BCPS, AAHIVP []  Legrand Como, Pharm.D., BCPS, AAHIVP  Positive urine culture  []  Patient discharged without antimicrobial prescription and treatment is now indicated []  Organism is resistant to prescribed ED discharge antimicrobial []  Patient with positive blood cultures [X]  Patient discharged originally without antimicrobial agent and treatment is now indicated  New antibiotic prescription: amoxicillin 250mg  po TID x 7 days  ED Provider: Hyman Bible, PA-C      Varney Baas 04/22/2013, 2:20 PM

## 2013-04-22 NOTE — ED Notes (Signed)
Patient informed of positive results after id'd x 2 and requests that rx be called to [facility]- [phone]

## 2013-04-22 NOTE — Progress Notes (Signed)
ED Antimicrobial Stewardship Positive Culture Follow Up   Carrie [name] is an 33 y.o. female who presented to [facility] on 04/16/2013 with a chief complaint of  Chief Complaint  Patient presents with  . Chest Pain    Recent Results (from the past 720 hour(s))  URINE CULTURE     Status: None   Collection Time    04/16/13  9:55 PM      Result Value Range Status   Specimen Description URINE, CLEAN CATCH   Final   Special Requests NONE   Final   Culture  Setup Time     Final   Value: 04/17/2013 05:09     Performed at [facility]     Final   Value: >=100,000 COLONIES/ML     Performed at [facility]   Culture     Final   Value: ENTEROCOCCUS SPECIES     Performed at [facility]   Report Status 04/21/2013 FINAL   Final   Organism ID, Bacteria ENTEROCOCCUS SPECIES   Final  WET PREP, GENITAL     Status: Abnormal   Collection Time    04/16/13 11:52 PM      Result Value Range Status   Yeast Wet Prep HPF POC NONE SEEN  NONE SEEN Final   Trich, Wet Prep NONE SEEN  NONE SEEN Final   Clue Cells Wet Prep HPF POC RARE (*) NONE SEEN Final   WBC, Wet Prep HPF POC MANY (*) NONE SEEN Final  GC/CHLAMYDIA PROBE AMP     Status: None   Collection Time    04/16/13 11:54 PM      Result Value Range Status   CT Probe RNA NEGATIVE  NEGATIVE Final   GC Probe RNA NEGATIVE  NEGATIVE Final   Comment: (NOTE)                                                                                               **Normal Reference Range: Negative**          Assay performed using the Gen-Probe APTIMA COMBO2 (R) Assay.     Acceptable specimen types for this assay include APTIMA Swabs (Unisex,     endocervical, urethral, or vaginal), first void urine, and ThinPrep     liquid based cytology samples.     Performed at [facility]     [x]  Patient discharged originally without antimicrobial agent and treatment is now indicated  New antibiotic prescription:  amoxicillin 250mg  po TID x 7 days  ED Provider: Hyman Bible, PA-C   Candie Mile 04/22/2013, 9:33 AM Infectious Diseases Pharmacist Phone# [phone]

## 2013-07-25 ENCOUNTER — Encounter (HOSPITAL_COMMUNITY): Payer: Self-pay | Admitting: Emergency Medicine

## 2013-07-25 ENCOUNTER — Emergency Department (HOSPITAL_COMMUNITY)
Admission: EM | Admit: 2013-07-25 | Discharge: 2013-07-25 | Disposition: A | Discharge status: Home / Self Care | Payer: BC Managed Care – PPO | Attending: Emergency Medicine | Admitting: Emergency Medicine

## 2013-07-25 DIAGNOSIS — E1149 Type 2 diabetes mellitus with other diabetic neurological complication: Secondary | ICD-10-CM | POA: Insufficient documentation

## 2013-07-25 DIAGNOSIS — R51 Headache: Secondary | ICD-10-CM | POA: Insufficient documentation

## 2013-07-25 DIAGNOSIS — Y92410 Unspecified street and highway as the place of occurrence of the external cause: Secondary | ICD-10-CM | POA: Insufficient documentation

## 2013-07-25 DIAGNOSIS — Z794 Long term (current) use of insulin: Secondary | ICD-10-CM | POA: Insufficient documentation

## 2013-07-25 DIAGNOSIS — S139XXA Sprain of joints and ligaments of unspecified parts of neck, initial encounter: Secondary | ICD-10-CM

## 2013-07-25 DIAGNOSIS — Z79899 Other long term (current) drug therapy: Secondary | ICD-10-CM | POA: Insufficient documentation

## 2013-07-25 DIAGNOSIS — T148XXA Other injury of unspecified body region, initial encounter: Secondary | ICD-10-CM

## 2013-07-25 DIAGNOSIS — Y9389 Activity, other specified: Secondary | ICD-10-CM | POA: Insufficient documentation

## 2013-07-25 DIAGNOSIS — S5000XA Contusion of unspecified elbow, initial encounter: Secondary | ICD-10-CM | POA: Insufficient documentation

## 2013-07-25 DIAGNOSIS — E1142 Type 2 diabetes mellitus with diabetic polyneuropathy: Secondary | ICD-10-CM | POA: Insufficient documentation

## 2013-07-25 LAB — CBG MONITORING, ED: Glucose-Capillary: 426 mg/dL — ABNORMAL HIGH (ref 70–99)

## 2013-07-25 MED ORDER — METHOCARBAMOL 500 MG PO TABS: 500.0000 mg | ORAL_TABLET | Freq: Two times a day (BID) | ORAL | Status: DC

## 2013-07-25 MED ORDER — HYDROCODONE-ACETAMINOPHEN 5-325 MG PO TABS: 1.0000 | ORAL_TABLET | Freq: Four times a day (QID) | ORAL | Status: DC | PRN

## 2013-07-25 MED ORDER — IBUPROFEN 600 MG PO TABS: 600.0000 mg | ORAL_TABLET | Freq: Four times a day (QID) | ORAL | Status: DC | PRN
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
## 2013-07-25 NOTE — ED Notes (Signed)
Spoke with pt ab blood
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
## 2013-07-25 NOTE — ED Notes (Signed)
Spoke with pt ab blood sugar being elevated. Pt explained that she had not taken her medication since 1pm this afternoon. Teaching done on making the importance of taking diabetic medications, what to do to keep blood sugars under control and if she doe snot feel well to return to the ED. Pt verbalized understanding and states she will take her meds in the morning. MD aware of CBG.

## 2013-11-08 ENCOUNTER — Encounter (HOSPITAL_COMMUNITY): Payer: Self-pay | Admitting: Emergency Medicine

## 2013-11-08 ENCOUNTER — Emergency Department (HOSPITAL_COMMUNITY)
Admission: EM | Admit: 2013-11-08 | Discharge: 2013-11-08 | Disposition: A | Discharge status: Home / Self Care | Payer: BC Managed Care – PPO | Attending: Emergency Medicine | Admitting: Emergency Medicine

## 2013-11-08 DIAGNOSIS — E1149 Type 2 diabetes mellitus with other diabetic neurological complication: Secondary | ICD-10-CM | POA: Insufficient documentation

## 2013-11-08 DIAGNOSIS — K089 Disorder of teeth and supporting structures, unspecified: Secondary | ICD-10-CM | POA: Insufficient documentation

## 2013-11-08 DIAGNOSIS — M7989 Other specified soft tissue disorders: Secondary | ICD-10-CM | POA: Insufficient documentation

## 2013-11-08 DIAGNOSIS — E1142 Type 2 diabetes mellitus with diabetic polyneuropathy: Secondary | ICD-10-CM | POA: Insufficient documentation

## 2013-11-08 DIAGNOSIS — Z79899 Other long term (current) drug therapy: Secondary | ICD-10-CM | POA: Insufficient documentation

## 2013-11-08 DIAGNOSIS — I10 Essential (primary) hypertension: Secondary | ICD-10-CM | POA: Insufficient documentation

## 2013-11-08 DIAGNOSIS — Z794 Long term (current) use of insulin: Secondary | ICD-10-CM | POA: Insufficient documentation

## 2013-11-08 DIAGNOSIS — K0889 Other specified disorders of teeth and supporting structures: Secondary | ICD-10-CM

## 2013-11-08 LAB — I-STAT CHEM 8, ED
BUN: 18 mg/dL (ref 6–23)
CHLORIDE: 104 meq/L (ref 96–112)
CREATININE: 0.8 mg/dL (ref 0.50–1.10)
Calcium, Ion: 1.17 mmol/L (ref 1.12–1.23)
Glucose, Bld: 163 mg/dL — ABNORMAL HIGH (ref 70–99)
HCT: 37 % (ref 36.0–46.0)
Hemoglobin: 12.6 g/dL (ref 12.0–15.0)
Potassium: 4 mEq/L (ref 3.7–5.3)
SODIUM: 136 meq/L — AB (ref 137–147)
TCO2: 25 mmol/L (ref 0–100)

## 2013-11-08 MED ORDER — AMLODIPINE BESYLATE 5 MG PO TABS: 5.0000 mg | ORAL_TABLET | Freq: Every day | ORAL | Status: DC

## 2013-11-08 MED ORDER — AMOXICILLIN 500 MG PO CAPS: 500.0000 mg | ORAL_CAPSULE | Freq: Three times a day (TID) | ORAL | Status: DC

## 2013-11-08 NOTE — ED Provider Notes (Signed)
CSN: [id number]     Arrival date & time 11/08/13  0351 History   First MD Initiated Contact with Patient 11/08/13 0554     Chief Complaint  Patient presents with  . Dental Pain     (Consider location/radiation/quality/duration/timing/severity/associated sxs/prior Treatment) HPI Comments: 33 year old female with history of cholesterol, lipids, diabetes presents with dental pain and leg swelling. Patient has had left upper dental pain for almost one month. Patient does not currently have a dentist. Patient has mild swelling but no fevers or chills. No current antibiotics. Patient has had mild lower leg swelling and ankle swelling bilateral worse with standing for prolonged periods in the warm weather. Patient denies liver, kidney, heart disease or problems. Patient denies blood clot history, recent surgery, active cancer or pain.  Patient is a 33 y.o. female presenting with tooth pain. The history is provided by the patient.  Dental Pain Associated symptoms: no congestion, no fever, no headaches and no neck pain     Past Medical History  Diagnosis Date  . Diabetes mellitus without complication   . Diabetic neuropathy    History reviewed. No pertinent past surgical history. Family History  Problem Relation Age of Onset  . Hyperlipidemia Mother   . Asthma Daughter   . Diabetes Maternal Grandmother   . Kidney disease Maternal Grandfather   . Kidney disease Paternal Grandmother    History  Substance Use Topics  . Smoking status: Never Smoker   . Smokeless tobacco: Never Used  . Alcohol Use: 2.0 oz/week    4 drink(s) per week     Comment: 2-3 a week   OB History   Grav Para Term Preterm Abortions TAB SAB Ect Mult Living                 Review of Systems  Constitutional: Negative for fever and chills.  HENT: Negative for congestion.   Eyes: Negative for visual disturbance.  Respiratory: Negative for shortness of breath.   Cardiovascular: Negative for chest pain.   Gastrointestinal: Negative for vomiting and abdominal pain.  Genitourinary: Negative for dysuria and flank pain.  Musculoskeletal: Negative for back pain, neck pain and neck stiffness.  Skin: Negative for rash.  Neurological: Negative for light-headedness and headaches.      Allergies  Review of patient's allergies indicates no known allergies.  Home Medications   Prior to Admission medications   Medication Sig Start Date End Date Taking? Authorizing Provider  amitriptyline (ELAVIL) 50 MG tablet Take 1 tablet (50 mg total) by mouth at bedtime. PATIENT NEEDS OFFICE VISIT FOR ADDITIONAL REFILLS 04/15/13  Yes Robyn Haber, MD  cyclobenzaprine (FLEXERIL) 10 MG tablet Take 10 mg by mouth 2 (two) times daily as needed for muscle spasms.   Yes Historical Provider, MD  ibuprofen (ADVIL,MOTRIN) 200 MG tablet Take 600 mg by mouth every 6 (six) hours as needed for moderate pain or cramping.    Yes Historical Provider, MD  insulin NPH-regular (NOVOLIN 70/30) (70-30) 100 UNIT/ML injection Inject 20 Units into the skin 2 (two) times daily with a meal.    Yes Historical Provider, MD  Multiple Vitamin (MULTIVITAMIN WITH MINERALS) TABS tablet Take 1 tablet by mouth daily.   Yes Historical Provider, MD  traMADol (ULTRAM) 50 MG tablet Take 100 mg by mouth every 6 (six) hours as needed for moderate pain (neuropathy).   Yes Historical Provider, MD   BP 150/101  Pulse 108  Temp(Src) 98.7 F (37.1 C) (Oral)  Resp 20  SpO2 100%  LMP 10/15/2013 Physical Exam  Nursing note and vitals reviewed. Constitutional: She is oriented to person, place, and time. She appears well-developed and well-nourished.  HENT:  Head: Normocephalic.  Patient with mild gingiva tenderness left upper first molar location, tooth missing, no drainage or discharge. No swelling or abscess palpated. Mild left anterior cervical adenopathy, neck supple. No trismus, uvular deviation, unilateral posterior pharyngeal edema or  submandibular swelling.   Eyes: Conjunctivae are normal. Right eye exhibits no discharge. Left eye exhibits no discharge.  Neck: Normal range of motion. Neck supple. No tracheal deviation present.  Cardiovascular: Normal rate and regular rhythm.   Pulmonary/Chest: Effort normal and breath sounds normal.  Musculoskeletal: She exhibits edema. She exhibits no tenderness.  Neurological: She is alert and oriented to person, place, and time.  Skin: Skin is warm. No rash noted.  Patient has mild swelling bilateral lower extremity, nontender worse ankles bilateral.  Psychiatric: She has a normal mood and affect.    ED Course  Procedures (including critical care time) Labs Review Labs Reviewed  I-STAT CHEM 8, ED - Abnormal; Notable for the following:    Sodium 136 (*)    Glucose, Bld 163 (*)    All other components within normal limits    Imaging Review No results found.   EKG Interpretation None      MDM   Final diagnoses:  Pain, dental  Essential hypertension  Leg swelling   Well-appearing female presents with dental pain, no signs of significant ENT infection on exam. Discussed outpatient followup with dentistry and amoxicillin. Patient has nonspecific mild ankle and lower extremity edema bilateral, no blood clot risk factors, no history of heart, kidney, liver disease. Patient has no symptoms of endorgan damage. Discussed starting amlodipine and close followup with primary Dr.  Results and differential diagnosis were discussed with the patient/parent/guardian. Close follow up outpatient was discussed, comfortable with the plan.   Medications - No data to display  Filed Vitals:   11/08/13 0404 11/08/13 0601  BP: 161/101 150/101  Pulse: 120 108  Temp: 98.5 F (36.9 C) 98.7 F (37.1 C)  TempSrc: Oral Oral  Resp: 18 20  SpO2: 100% 100%        Mariea Clonts, MD 11/08/13 [phone]

## 2013-11-08 NOTE — ED Notes (Signed)
Went into room mulitple times to d/c pt states that she understands d/c pt just "wants to sleep". Pt states that she will get dressed

## 2013-11-08 NOTE — Discharge Instructions (Signed)
Followup with your local Dr. as discussed for blood pressure management and reassessment. Return to the ER for new or worsening symptoms. Take antibiotics as discussed.  If you were given medicines take as directed.  If you are on coumadin or contraceptives realize their levels and effectiveness is altered by many different medicines.  If you have any reaction (rash, tongues swelling, other) to the medicines stop taking and see a physician.   Please follow up as directed and return to the ER or see a physician for new or worsening symptoms.  Thank you. Filed Vitals:   11/08/13 0404 11/08/13 0601  BP: 161/101 150/101  Pulse: 120 108  Temp: 98.5 F (36.9 C) 98.7 F (37.1 C)  TempSrc: Oral Oral  Resp: 18 20  SpO2: 100% 100%   Please see a dentist.   Emergency Department Resource Guide 1) Find a Doctor and Pay Out of Pocket Although you won't have to find out who is covered by your insurance plan, it is a good idea to ask around and get recommendations. You will then need to call the office and see if the doctor you have chosen will accept you as a new patient and what types of options they offer for patients who are self-pay. Some doctors offer discounts or will set up payment plans for their patients who do not have insurance, but you will need to ask so you aren't surprised when you get to your appointment.  2) Contact Your Local Health Department Not all health departments have doctors that can see patients for sick visits, but many do, so it is worth a call to see if yours does. If you don't know where your local health department is, you can check in your phone book. The CDC also has a tool to help you locate your state's health department, and many state websites also have listings of all of their local health departments.  3) Find a [facility] If your illness is not likely to be very severe or complicated, you may want to try a walk in clinic. These are popping up all over the  country in pharmacies, drugstores, and shopping centers. They're usually staffed by nurse practitioners or physician assistants that have been trained to treat common illnesses and complaints. They're usually fairly quick and inexpensive. However, if you have serious medical issues or chronic medical problems, these are probably not your best option.  No Primary Care Doctor: - Call Health Connect at  [phone] - they can help you locate a primary care doctor that  accepts your insurance, provides certain services, etc. - Physician Referral Service- [phone]  Chronic Pain Problems: Organization         Address  Phone   Notes  [facility]  [phone] Patients need to be referred by their primary care doctor.   Medication Assistance: Organization         Address  Phone   Notes  [facility] [address] [place], [place] [address] [phone] --Must be a resident of [facility] -- Must have NO insurance coverage whatsoever (no Medicaid/ Medicare, etc.) -- The pt. MUST have a primary care doctor that directs their care regularly and follows them in the community   MedAssist  [phone]   [employer]  [phone]    Agencies that provide inexpensive medical care: Organization         Address  Phone   Notes  [facility]  [phone]   [facility]    [phone]   [facility] [place], [place] [address] [phone]   [place] [address] [place] [phone]   Planned Parenthood    [phone]   [facility]    [phone]   [place] and [place] [address], [place] Phone:  [phone], Fax:  [phone] Hours of Operation:  9 am - 6 pm, M-F.  Also accepts Medicaid/Medicare and self-pay.  [facility] for [place] [address], [address], [place] Phone: [phone], Fax: [phone]. Hours of Operation:  8:30 am - 5:30 pm, M-F.  Also accepts Medicaid and self-pay.  [facility] [address], [place] Phone: [phone]   [place], [place], [place] [phone], Ext. [phone] Mondays & Thursdays: 7-9 AM.  First 15 patients are seen on a first come, first serve basis.    [place] Providers:  Organization         Address  Phone   Notes  [facility] [address], [place] [phone] Also accepts self-pay patients.  [facility] [id number] [place], [place]  [phone]   [place], [address], [place] [phone]   [facility] [address] [place] [phone]   Lucianne Lei [address], [place]   [phone] Only accepts [place] Access [place] patients after they have their name applied to their card.   Self-Pay (no insurance) in [facility]:  Organization         Address  Phone   Notes  Sickle Cell Patients, [facility] [place] [phone]   [facility] [place] [phone]   [facility] [address]  [place], [place], [place] [phone]   [facility]/Dr. Osei-Bonsu  [address] [place] or [address], [place] [phone] Phone number for both [place] and [place] locations is the same.  [facility] [address] [place] [phone]   [facility] [address] [place] or [address] [phone]   [facility] [address], [place] [phone], phone; [phone], fax Sees patients 1st and 3rd Saturday of every month.  Must  not qualify for public or private insurance (i.e. Medicaid, Medicare, [place] Health Choice, Veterans' Benefits)  Household income should be no more than 200% of the poverty level The clinic cannot treat you if you are pregnant or think you are pregnant  Sexually transmitted diseases are not treated at the clinic.    Dental Care: Organization         Address  Phone  Notes  [facility] Department of [facility] [phone] Accepts children up to age 8 who are enrolled in [place] or [place]; pregnant women with a Medicaid card; and children who have applied for Medicaid or [place] Health Choice, but were declined, whose parents can pay a reduced fee at time of service.  [facility] Department of  [facility]  [address], [place] [phone] Accepts children up to age 66 who are enrolled in Medicaid or [place]; pregnant women with a Medicaid card; and children who have applied for Medicaid or [place] Health Choice, but were declined, whose parents can pay a reduced fee at time of service.  [place] Adult Dental Access PROGRAM  [place] [phone] Patients are seen by appointment only. Walk-ins are not accepted. [place] will see patients 31 years of age and older. Monday - Tuesday (8am-5pm) Most Wednesdays (8:30-5pm) $30 per visit, cash only  [facility] Adult Dental Access PROGRAM  [address], [facility] [phone] Patients are seen by appointment only. Walk-ins are not accepted. [place] will see patients 65 years of age and older. One Wednesday Evening (Monthly: Volunteer Based).  $30 per visit, cash only  [place]  [phone] for adults; Children under age 24, call Graduate Pediatric Dentistry at [phone]. Children aged 21-14, please call [phone] to request a pediatric application.  Dental services are  provided in all areas of dental care including fillings, crowns and bridges, complete and partial dentures, implants, gum treatment, root canals, and extractions. Preventive care is also provided. Treatment is provided to both adults and children. Patients are selected via a lottery and there is often a waiting list.   [facility] [address], [place]  [phone] [email or web address]   [facility] [address] [place], [place] [phone], Ext. [phone] Second and Fourth Thursday of each month, opens at 6:30 AM; Clinic ends at 9 AM.  Patients are seen on a first-come first-served basis, and a limited number are seen during each clinic.   [facility]  [address] Hillard Danker [place], [place] [phone]   Eligibility Requirements You must have lived in [place], [place], or [place] counties for at least the last three months.   You cannot be eligible for state or federal sponsored [employer], including [employer], [place], or [employer].   You generally cannot be eligible for healthcare insurance through your employer.    How to apply: Eligibility screenings are held every Tuesday and Wednesday afternoon from 1:00 pm until 4:00 pm. You do not need an appointment for the interview!  [facility] [address], [place], [place]   [place]  [place] Department  [place]  [phone]    Behavioral Health Resources in the Community: Intensive Outpatient Programs Organization         Address  Phone  Notes  [place] [facility] [address], [place], [place] [phone]   [facility] [address] [place], [place]   ADS: Alcohol & Drug Svcs [address] [place], [place]   [place] [address],  [place], [place] or [phone]   Substance Abuse Resources Organization         Address  Phone  Notes  Alcohol and Drug Services  [phone]   [place]  [phone]   The [place]   Chinita Pester  [phone]   Residential & Outpatient Substance Abuse Program  [phone]   Psychological Services Organization         Address  Phone  Notes  [place]  [place]   [place] [address], [place] or [phone]    Mobile Crisis Teams Organization         Address  Phone  Notes  [facility], Mobile Crisis Care Unit  [phone]   [facility]  [address] [place], [place]   [place] Levels [address], [place] [phone]    Self-Help/Support Groups Organization         Address  Phone             Notes  [place]. of [place] - variety of support groups  [place] Call for more information  Narcotics Anonymous (NA), [facility] [address], [employer] Manuel [place]  2 meetings at this location   Special educational needs teacher         Address  Phone  Notes  [facility] Residential Treatment [place],    [place]  [phone]   [facility]  [address], [place] [phone], [place], [place]   [place], [place] [phone] Admissions: 8am-3pm M-F  Incentives Substance [place] [address]    [place], [place] [phone]   [facility] [address] [place], [place], [place]   [facility] [address],  [place], [place]   [facility] Programs - Intensive Outpatient [address], [place], [place]   [facility] [address].,  [place], [place] [phone] or  [phone]   [facility] Services (RTS) [address] [place], [place] Accepts Medicaid  [facility] [place] [address]  [place] [phone] Substance Abuse/Addiction Treatment   [facility] Organization         Address  Phone  Notes  [facility]  [phone]   Domenic Schwab, PhD [address] Arlis Porta [place], [place]   [phone] or [phone]   [place] [place], [place] [phone]   [facility] [address] [place], [place] [phone] Insurance/Medicaid/sponsorship through [facility] and Families [address]                                    [place], [place] [phone] [place] [address], [place] [phone]    Dr. Adele Schilder  [phone]   [facility] Dept. 1) [address], [place] 2) [place] 3)  [address], [place] [phone]  [phone]   [place] [phone] or [phone] (After Hours)

## 2013-11-08 NOTE — ED Notes (Signed)
Pt arrived to the ED with a complaint of dental pain.  Pt states that the pain is located in the left upper mid jaw.  Pt states the tooth has been bothering her for some time but it has increased in intensity in the last 2 days.

## 2013-11-14 ENCOUNTER — Emergency Department (HOSPITAL_COMMUNITY)
Admission: EM | Admit: 2013-11-14 | Discharge: 2013-11-14 | Disposition: A | Discharge status: Home / Self Care | Payer: BC Managed Care – PPO | Attending: Emergency Medicine | Admitting: Emergency Medicine

## 2013-11-14 ENCOUNTER — Emergency Department (HOSPITAL_COMMUNITY): Payer: BC Managed Care – PPO

## 2013-11-14 ENCOUNTER — Encounter (HOSPITAL_COMMUNITY): Payer: Self-pay | Admitting: Emergency Medicine

## 2013-11-14 DIAGNOSIS — E1149 Type 2 diabetes mellitus with other diabetic neurological complication: Secondary | ICD-10-CM | POA: Insufficient documentation

## 2013-11-14 DIAGNOSIS — E1142 Type 2 diabetes mellitus with diabetic polyneuropathy: Secondary | ICD-10-CM | POA: Insufficient documentation

## 2013-11-14 DIAGNOSIS — R519 Headache, unspecified: Secondary | ICD-10-CM

## 2013-11-14 DIAGNOSIS — Z794 Long term (current) use of insulin: Secondary | ICD-10-CM | POA: Insufficient documentation

## 2013-11-14 DIAGNOSIS — Z792 Long term (current) use of antibiotics: Secondary | ICD-10-CM | POA: Insufficient documentation

## 2013-11-14 DIAGNOSIS — Z8719 Personal history of other diseases of the digestive system: Secondary | ICD-10-CM | POA: Insufficient documentation

## 2013-11-14 DIAGNOSIS — H748X9 Other specified disorders of middle ear and mastoid, unspecified ear: Secondary | ICD-10-CM | POA: Insufficient documentation

## 2013-11-14 DIAGNOSIS — R51 Headache: Secondary | ICD-10-CM | POA: Insufficient documentation

## 2013-11-14 DIAGNOSIS — Z79899 Other long term (current) drug therapy: Secondary | ICD-10-CM | POA: Insufficient documentation

## 2013-11-14 LAB — BASIC METABOLIC PANEL
ANION GAP: 12 (ref 5–15)
BUN: 17 mg/dL (ref 6–23)
CHLORIDE: 106 meq/L (ref 96–112)
CO2: 22 meq/L (ref 19–32)
Calcium: 8.9 mg/dL (ref 8.4–10.5)
Creatinine, Ser: 0.69 mg/dL (ref 0.50–1.10)
GFR calc Af Amer: >90 mL/min (ref >90)
GFR calc non Af Amer: >90 mL/min (ref >90)
GLUCOSE: 122 mg/dL — AB (ref 70–99)
POTASSIUM: 4 meq/L (ref 3.7–5.3)
SODIUM: 140 meq/L (ref 137–147)

## 2013-11-14 LAB — CBC WITH DIFFERENTIAL/PLATELET
BASOS ABS: 0 10*3/uL (ref 0.0–0.1)
Basophils Relative: 1 % (ref 0–1)
Eosinophils Absolute: 0.1 10*3/uL (ref 0.0–0.7)
Eosinophils Relative: 1 % (ref 0–5)
HCT: 37.5 % (ref 36.0–46.0)
Hemoglobin: 12.3 g/dL (ref 12.0–15.0)
LYMPHS PCT: 35 % (ref 12–46)
Lymphs Abs: 2 10*3/uL (ref 0.7–4.0)
MCH: 30.2 pg (ref 26.0–34.0)
MCHC: 32.8 g/dL (ref 30.0–36.0)
MCV: 92.1 fL (ref 78.0–100.0)
Monocytes Absolute: 0.3 10*3/uL (ref 0.1–1.0)
Monocytes Relative: 5 % (ref 3–12)
NEUTROS ABS: 3.3 10*3/uL (ref 1.7–7.7)
Neutrophils Relative %: 58 % (ref 43–77)
PLATELETS: 357 10*3/uL (ref 150–400)
RBC: 4.07 MIL/uL (ref 3.87–5.11)
RDW: 12.5 % (ref 11.5–15.5)
WBC: 5.8 10*3/uL (ref 4.0–10.5)

## 2013-11-14 MED ORDER — DIPHENHYDRAMINE HCL 50 MG/ML IJ SOLN
25.0000 mg | Freq: Once | INTRAMUSCULAR | Status: AC
  Administered 2013-11-14: 25 mg via INTRAVENOUS
  Filled 2013-11-14: qty 1

## 2013-11-14 MED ORDER — MORPHINE SULFATE 4 MG/ML IJ SOLN: 4.0000 mg | Freq: Once | INTRAMUSCULAR | Status: DC

## 2013-11-14 MED ORDER — KETOROLAC TROMETHAMINE 30 MG/ML IJ SOLN
30.0000 mg | Freq: Once | INTRAMUSCULAR | Status: AC
  Administered 2013-11-14: 30 mg via INTRAVENOUS
  Filled 2013-11-14: qty 1

## 2013-11-14 MED ORDER — HYDROMORPHONE HCL PF 1 MG/ML IJ SOLN
1.0000 mg | Freq: Once | INTRAMUSCULAR | Status: AC
  Administered 2013-11-14: 1 mg via INTRAVENOUS
  Filled 2013-11-14: qty 1

## 2013-11-14 MED ORDER — METOCLOPRAMIDE HCL 5 MG/ML IJ SOLN
10.0000 mg | Freq: Once | INTRAMUSCULAR | Status: AC
  Administered 2013-11-14: 10 mg via INTRAVENOUS
  Filled 2013-11-14: qty 2

## 2013-11-14 MED ORDER — OXYCODONE-ACETAMINOPHEN 5-325 MG PO TABS: 2.0000 | ORAL_TABLET | Freq: Four times a day (QID) | ORAL | Status: DC | PRN

## 2013-11-14 MED ORDER — SODIUM CHLORIDE 0.9 % IV BOLUS (SEPSIS)
1000.0000 mL | Freq: Once | INTRAVENOUS | Status: AC
  Administered 2013-11-14: 1000 mL via INTRAVENOUS

## 2013-11-14 NOTE — ED Provider Notes (Signed)
Medical screening examination/treatment/procedure(s) were performed by non-physician practitioner and as supervising physician I was immediately available for consultation/collaboration.   EKG Interpretation None        Wynetta Fines, MD 11/14/13 2244

## 2013-11-14 NOTE — ED Notes (Signed)
Call rec'd from lab, specimen will have to be sent out for gram stain and culture only.  EDPA aware.

## 2013-11-14 NOTE — ED Notes (Signed)
Pt is sitting up in the bed, watching television.

## 2013-11-14 NOTE — Discharge Instructions (Signed)

## 2013-11-14 NOTE — ED Provider Notes (Signed)
CSN: [id number]     Arrival date & time 11/14/13  0418 History   First MD Initiated Contact with Patient 11/14/13 [phone]     Chief Complaint  Patient presents with  . Headache     (Consider location/radiation/quality/duration/timing/severity/associated sxs/prior Treatment) HPI Comments: Patient presents to the emergency department with chief complaint of headache and face pain. She states that she was seen last week for the same complaint, and was diagnosed with toothache. She states that she tried taking amoxicillin and hydrocodone with no relief. She states that she saw her dentist yesterday, and was told that her teeth were fine, but that there was clouding of her sinuses. She was given additional amoxicillin, and have her followup with her primary care provider. She states that since she did not get better overnight, she came to be evaluated this morning. She denies fever she chills. Denies any purulent discharge or swelling. She states the pain is worsened with movement, and better with rest.  The history is provided by the patient. No language interpreter was used.    Past Medical History  Diagnosis Date  . Diabetes mellitus without complication   . Diabetic neuropathy    History reviewed. No pertinent past surgical history. Family History  Problem Relation Age of Onset  . Hyperlipidemia Mother   . Asthma Daughter   . Diabetes Maternal Grandmother   . Kidney disease Maternal Grandfather   . Kidney disease Paternal Grandmother    History  Substance Use Topics  . Smoking status: Never Smoker   . Smokeless tobacco: Never Used  . Alcohol Use: 2.0 oz/week    4 drink(s) per week     Comment: 2-3 a week   OB History   Grav Para Term Preterm Abortions TAB SAB Ect Mult Living                 Review of Systems  All other systems reviewed and are negative.     Allergies  Review of patient's allergies indicates no known allergies.  Home Medications   Prior to Admission  medications   Medication Sig Start Date End Date Taking? Authorizing Provider  amitriptyline (ELAVIL) 50 MG tablet Take 1 tablet (50 mg total) by mouth at bedtime. PATIENT NEEDS OFFICE VISIT FOR ADDITIONAL REFILLS 04/15/13  Yes Robyn Haber, MD  amLODipine (NORVASC) 5 MG tablet Take 1 tablet (5 mg total) by mouth daily. 11/08/13  Yes Mariea Clonts, MD  amoxicillin (AMOXIL) 500 MG capsule Take 1 capsule (500 mg total) by mouth 3 (three) times daily. 11/08/13  Yes Mariea Clonts, MD  cyclobenzaprine (FLEXERIL) 10 MG tablet Take 10 mg by mouth 2 (two) times daily as needed for muscle spasms.   Yes Historical Provider, MD  diphenhydramine-acetaminophen (TYLENOL PM) 25-500 MG TABS Take 3 tablets by mouth at bedtime.   Yes Historical Provider, MD  HYDROcodone-acetaminophen (NORCO/VICODIN) 5-325 MG per tablet Take 1 tablet by mouth every 6 (six) hours as needed for moderate pain.   Yes Historical Provider, MD  ibuprofen (ADVIL,MOTRIN) 200 MG tablet Take 800 mg by mouth every 6 (six) hours as needed for moderate pain or cramping.    Yes Historical Provider, MD  insulin NPH-regular (NOVOLIN 70/30) (70-30) 100 UNIT/ML injection Inject 25 Units into the skin 2 (two) times daily with a meal.    Yes Historical Provider, MD  Multiple Vitamin (MULTIVITAMIN WITH MINERALS) TABS tablet Take 1 tablet by mouth daily.   Yes Historical Provider, MD  traMADol (ULTRAM) 50 MG  tablet Take 100 mg by mouth every 6 (six) hours as needed for moderate pain (neuropathy).   Yes Historical Provider, MD   BP 134/79  Pulse 109  Temp(Src) 97.9 F (36.6 C) (Oral)  Resp 14  SpO2 100%  LMP 10/15/2013 Physical Exam  Nursing note and vitals reviewed. Constitutional: She is oriented to person, place, and time. She appears well-developed and well-nourished.  HENT:  Head: Normocephalic and atraumatic.  Right Ear: External ear normal.  Left Ear: External ear normal.  Mouth/Throat: Oropharynx is clear and moist. No oropharyngeal  exudate.  Swollen, erythematous turbinates, maxillary sinuses tender to palpation  Oropharynx is clear  Left tympanic membrane is mildly erythematous with some effusion  Oral mucosa is unremarkable, no evidence of tongue swelling or sublingual mucosal swelling  Eyes: Conjunctivae and EOM are normal. Pupils are equal, round, and reactive to light.  Neck: Normal range of motion. Neck supple.  No pain with neck flexion, no meningismus  Cardiovascular: Normal rate, regular rhythm and normal heart sounds.  Exam reveals no gallop and no friction rub.   No murmur heard. Pulmonary/Chest: Effort normal and breath sounds normal. No respiratory distress. She has no wheezes. She has no rales. She exhibits no tenderness.  Abdominal: Soft. She exhibits no distension and no mass. There is no tenderness. There is no rebound and no guarding.  Musculoskeletal: Normal range of motion. She exhibits no edema and no tenderness.  Normal gait.  Neurological: She is alert and oriented to person, place, and time. She has normal reflexes.  CN 3-12 intact, normal finger to nose, no pronator drift, sensation and strength intact bilaterally.  Skin: Skin is warm and dry.  Psychiatric: She has a normal mood and affect. Her behavior is normal. Judgment and thought content normal.    ED Course  Procedures (including critical care time) Results for orders placed during the hospital encounter of 11/14/13  CSF CULTURE      Result Value Ref Range   Specimen Description CSF BACK     Special Requests NONE     Gram Stain       Value: CYTOSPIN SLIDE WBC PRESENT,BOTH PMN AND MONONUCLEAR     NO ORGANISMS SEEN     Performed at [facility]   Culture PENDING     Report Status PENDING    CBC WITH DIFFERENTIAL      Result Value Ref Range   WBC 5.8  4.0 - 10.5 K/uL   RBC 4.07  3.87 - 5.11 MIL/uL   Hemoglobin 12.3  12.0 - 15.0 g/dL   HCT 37.5  36.0 - 46.0 %   MCV 92.1  78.0 - 100.0 fL   MCH 30.2  26.0 - 34.0 pg    MCHC 32.8  30.0 - 36.0 g/dL   RDW 12.5  11.5 - 15.5 %   Platelets 357  150 - 400 K/uL   Neutrophils Relative % 58  43 - 77 %   Neutro Abs 3.3  1.7 - 7.7 K/uL   Lymphocytes Relative 35  12 - 46 %   Lymphs Abs 2.0  0.7 - 4.0 K/uL   Monocytes Relative 5  3 - 12 %   Monocytes Absolute 0.3  0.1 - 1.0 K/uL   Eosinophils Relative 1  0 - 5 %   Eosinophils Absolute 0.1  0.0 - 0.7 K/uL   Basophils Relative 1  0 - 1 %   Basophils Absolute 0.0  0.0 - 0.1 K/uL  BASIC METABOLIC PANEL  Result Value Ref Range   Sodium 140  137 - 147 mEq/L   Potassium 4.0  3.7 - 5.3 mEq/L   Chloride 106  96 - 112 mEq/L   CO2 22  19 - 32 mEq/L   Glucose, Bld 122 (*) 70 - 99 mg/dL   BUN 17  6 - 23 mg/dL   Creatinine, Ser 0.69  0.50 - 1.10 mg/dL   Calcium 8.9  8.4 - 10.5 mg/dL   GFR calc non Af Amer >90  >90 mL/min   GFR calc Af Amer >90  >90 mL/min   Anion gap 12  5 - 15   Ct Head Wo Contrast  11/14/2013   CLINICAL DATA:  Two week history of headache; no history of injury  EXAM: CT HEAD WITHOUT CONTRAST  TECHNIQUE: Contiguous axial images were obtained from the base of the skull through the vertex without intravenous contrast.  COMPARISON:  None.  FINDINGS: The ventricles are normal in size and position. There is no intracranial hemorrhage nor intracranial mass effect. There is no acute ischemic change. The cerebellum and brainstem exhibit no acute abnormalities.  The observed paranasal sinuses and mastoid air cells are clear. There is no skull fracture.  IMPRESSION: There is no acute intracranial abnormality.   Electronically Signed   By: David  [place]   On: 11/14/2013 09:19     Imaging Review Ct Head Wo Contrast  11/14/2013   CLINICAL DATA:  Two week history of headache; no history of injury  EXAM: CT HEAD WITHOUT CONTRAST  TECHNIQUE: Contiguous axial images were obtained from the base of the skull through the vertex without intravenous contrast.  COMPARISON:  None.  FINDINGS: The ventricles are normal in size  and position. There is no intracranial hemorrhage nor intracranial mass effect. There is no acute ischemic change. The cerebellum and brainstem exhibit no acute abnormalities.  The observed paranasal sinuses and mastoid air cells are clear. There is no skull fracture.  IMPRESSION: There is no acute intracranial abnormality.   Electronically Signed   By: David  [place]   On: 11/14/2013 09:19     EKG Interpretation None      MDM   Final diagnoses:  Headache, unspecified headache type    Patient with face pain x 2 weeks.  Originally thought to be dental pain, but dentist said more likely sinuses.  Patient currently taking amoxicillin with no relief.  Will treat pain in the ED, but anticipate DC with ENT follow-up and will probably start augmentin given no improvement with amox.  Patient discussed with Dr. Dina Rich, will order CT head based on worsening of symptoms with lying.  10:01 AM CT is negative.  Highly doubt subarachnoid hemorrhage, given that the headache has progressively worsened over the week. She does not have any neurologic deficits. CT scan also did not show any evidence of sinusitis.  Patient seen by and discussed with Dr. Dina Rich, who recommends discussing LP with the patient to rule out elevated intracranial pressures.  LP was successful in ruling out pseudotumor, as the pressure was only 4.5, however, only one tube of CSF was collected before the spinal needle clotted and no additional CSF was obtained.    Upon discharge patient's mother asks about risk for aneurysm.  I discussed this with Dr. Dina Rich.  We feel that she is extremely low risk.  Only one CLEAR tube of CSF was obtained, and there was no evidence of xanthochromia.  Gram stain and culture are pending.  Normal head CT.  3:03 PM Patient's pain now improved.  Will discharge to home with neurology/oral surgery follow-up.  Patient understands and agrees with the plan.  He is stable and ready for discharge.  Montine Circle, PA-C 11/14/13 1504

## 2013-11-14 NOTE — ED Notes (Signed)
Pt complains of a left sided headache with an earache. She was seen here Saturday for the same but thought it was dental related, her dentist told her it must be her sinuses

## 2013-11-14 NOTE — ED Notes (Signed)
Patient states she was seen Saturday and given antibiotics for a toothache but states that no one looked at anything. Patient states she saw a dentist today and after completing a panoramic XR series stated to patient she had a sinus problem and needs to see an ENT. Patient states she called the ENT and made the appointment. Patient states her reason for being here tonight is continued pain of the upper left jaw that radiates into her ear. Patient states she is still taking the antibiotics as well as taking 4 Norco, last dose at 0300 as well as taking 3 Tylenol PMs tonight "to try and go to sleep". Patient A&Ox4 at this time.

## 2013-11-14 NOTE — ED Notes (Signed)
Initial contact - EDPA at bedside attempting LP at this time.

## 2013-11-14 NOTE — ED Notes (Signed)
LP complete, pt resting on stretcher, family at bedside, pt reports no pain at this time, denies needs/complaints currently.  Skin PWD.  A+Ox4.  Neuros grossly intact.  Speaking full/clear sentences, rr even/un-lab.  NAD.

## 2013-11-17 LAB — CSF CULTURE

## 2013-11-17 LAB — CSF CULTURE W GRAM STAIN: Culture: NO GROWTH

## 2013-11-30 ENCOUNTER — Encounter (HOSPITAL_COMMUNITY): Payer: Self-pay | Admitting: Emergency Medicine

## 2013-11-30 ENCOUNTER — Emergency Department (HOSPITAL_COMMUNITY)
Admission: EM | Admit: 2013-11-30 | Discharge: 2013-11-30 | Disposition: A | Discharge status: Home / Self Care | Payer: BC Managed Care – PPO | Attending: Emergency Medicine | Admitting: Emergency Medicine

## 2013-11-30 DIAGNOSIS — Z794 Long term (current) use of insulin: Secondary | ICD-10-CM | POA: Insufficient documentation

## 2013-11-30 DIAGNOSIS — E1142 Type 2 diabetes mellitus with diabetic polyneuropathy: Secondary | ICD-10-CM | POA: Insufficient documentation

## 2013-11-30 DIAGNOSIS — R51 Headache: Secondary | ICD-10-CM | POA: Insufficient documentation

## 2013-11-30 DIAGNOSIS — G500 Trigeminal neuralgia: Secondary | ICD-10-CM | POA: Diagnosis not present

## 2013-11-30 DIAGNOSIS — E1149 Type 2 diabetes mellitus with other diabetic neurological complication: Secondary | ICD-10-CM | POA: Diagnosis not present

## 2013-11-30 DIAGNOSIS — Z79899 Other long term (current) drug therapy: Secondary | ICD-10-CM | POA: Diagnosis not present

## 2013-11-30 MED ORDER — CARBAMAZEPINE 100 MG PO CHEW: 100.0000 mg | CHEWABLE_TABLET | Freq: Two times a day (BID) | ORAL | Status: DC

## 2013-11-30 MED ORDER — CARBAMAZEPINE 100 MG PO CHEW
100.0000 mg | CHEWABLE_TABLET | Freq: Two times a day (BID) | ORAL | Status: DC
Start: 2013-11-30 — End: 2013-12-12
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
## 2013-11-30 NOTE — ED Notes (Signed)
Family at bedside. 

## 2013-11-30 NOTE — ED Notes (Signed)
MD at bedside. EDP J KNAPP

## 2013-11-30 NOTE — Discharge Instructions (Signed)
Trigeminal Neuralgia Trigeminal neuralgia is a nerve disorder that causes sudden attacks of severe facial pain. It is caused by damage to the trigeminal nerve, a major nerve in the face. It is more common in women and in the elderly, although it can also happen in younger patients. Attacks last from a few seconds to several minutes and can occur from a couple of times per year to several times per day. Trigeminal neuralgia can be a very distressing and disabling condition. Surgery may be needed in very severe cases if medical treatment does not give relief. HOME CARE INSTRUCTIONS   If your caregiver prescribed medication to help prevent attacks, take as directed.  To help prevent attacks:  Chew on the unaffected side of the mouth.  Avoid touching your face.  Avoid blasts of hot or cold air.  Men may wish to grow a beard to avoid having to shave. SEEK IMMEDIATE MEDICAL CARE IF:  Pain is unbearable and your medicine does not help.  You develop new, unexplained symptoms (problems).  You have problems that may be related to a medication you are taking. Document Released: 03/31/2000 Document Revised: 06/26/2011 Document Reviewed: 01/29/2009 [facility] Patient Information 2015 [place], [place]. This information is not intended to replace advice given to you by your health care provider. Make sure you discuss any questions you have with your health care provider.

## 2013-12-12 ENCOUNTER — Encounter: Payer: Self-pay | Admitting: Neurology

## 2013-12-12 ENCOUNTER — Ambulatory Visit (INDEPENDENT_AMBULATORY_CARE_PROVIDER_SITE_OTHER): Payer: BC Managed Care – PPO | Admitting: Neurology

## 2013-12-12 ENCOUNTER — Ambulatory Visit: Payer: No Typology Code available for payment source | Admitting: Neurology

## 2013-12-12 VITALS — BP 153/103 | HR 100 | Ht 68.0 in | Wt 159.0 lb

## 2013-12-12 DIAGNOSIS — E1110 Type 2 diabetes mellitus with ketoacidosis without coma: Secondary | ICD-10-CM

## 2013-12-12 DIAGNOSIS — E1310 Other specified diabetes mellitus with ketoacidosis without coma: Secondary | ICD-10-CM

## 2013-12-12 DIAGNOSIS — R519 Headache, unspecified: Secondary | ICD-10-CM | POA: Insufficient documentation

## 2013-12-12 DIAGNOSIS — R51 Headache: Secondary | ICD-10-CM

## 2013-12-12 DIAGNOSIS — G500 Trigeminal neuralgia: Secondary | ICD-10-CM | POA: Insufficient documentation

## 2013-12-12 MED ORDER — CARBAMAZEPINE ER 100 MG PO TB12: 100.0000 mg | ORAL_TABLET | Freq: Three times a day (TID) | ORAL | Status: DC

## 2013-12-12 NOTE — Progress Notes (Signed)
PATIENT: Carrie [name] DOB: 03/16/1981  HISTORICAL  CIANNE USUI is 48 right-handed female, accompanied by her mother for evaluation of left facial pain, she is referred by her primary care physician  She had a past medical history of diabetes, since age 33, in 2014, her diabetes was out of control, she went into diabetic ketoacidosis, since then, she suffered bilateral lower extremity especially bilateral feet paresthesia, with diagnosis of diabetic peripheral neuropathy,  Around July 2015, she noticed intermittent shooting pain to her left cheek, left lower teeth, gradually getting worse, triggered by touching her face, biting, chewing, was initially evaluated by her dentist, no etiology found, presented to the emergency room, CAT scan was normal, even had a spinal tap,  Eventually the diagnosis of left trigeminal neuralgia was made, she was given prescription, Tegretol 100 mg tablet, which has been helpful, she is not taking 3 tablets a day, still has tinge of left discomfort, 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 amitriptyline (ELAVIL) 50 MG tablet Take 1 tablet (50 mg total) by mouth at bedtime. PATIENT NEEDS OFFICE VISIT FOR ADDITIONAL REFILLS  30 tablet  0  . aspirin-acetaminophen-caffeine (EXCEDRIN MIGRAINE) 250-250-65 MG per tablet Take 2 tablets by mouth every 6 (six) hours as needed for headache.      . carbamazepine (TEGRETOL) 100 MG chewable tablet Chew 1 tablet (100 mg total) by mouth 2 (two) times daily.  60 tablet  1  . cyclobenzaprine (FLEXERIL) 10 MG tablet Take 10 mg by mouth 2 (two) times daily as needed for muscle spasms.      Marland Kitchen ibuprofen  (ADVIL,MOTRIN) 200 MG tablet Take 600 mg by mouth every 6 (six) hours as needed for moderate pain or cramping.       . insulin NPH-regular (NOVOLIN 70/30) (70-30) 100 UNIT/ML injection Inject 25 Units into the skin 2 (two) times daily with a meal.       . Multiple Vitamin (MULTIVITAMIN WITH MINERALS) TABS tablet Take 1 tablet by mouth daily.      . traMADol (ULTRAM) 50 MG tablet Take 100 mg by mouth every 6 (six) hours as needed for moderate pain (neuropathy).       No current facility-administered medications on file prior to visit.    PAST MEDICAL HISTORY: Past Medical History  Diagnosis Date  . Diabetes mellitus without complication   . Diabetic neuropathy   . HA (headache)     PAST SURGICAL HISTORY: Past Surgical History  Procedure Laterality Date  . None      FAMILY HISTORY: Family History  Problem Relation Age of Onset  . Hyperlipidemia Mother   . Asthma Daughter   . Diabetes Maternal Grandmother   . Kidney disease Maternal Grandfather   . Kidney disease Paternal Grandmother     SOCIAL HISTORY:  History   Social History  . Marital Status: Single    Spouse Name: N/A    Number of Children: 1  . Years of Education: 13   Occupational History  . Call Center    Social History Main Topics  . Smoking status: Never Smoker   . Smokeless tobacco: Never Used  . Alcohol Use: 2.0  oz/week    4 drink(s) per week     Comment: 2-3 a week  . Drug Use: No  . Sexual Activity: Yes   Other Topics Concern  . Not on file   Social History Narrative   Patient lives at home with her father and daughter. Patient works full time at [employer] .   Education some college.   Right handed   Caffeine Five sodas daily.     PHYSICAL EXAM   Filed Vitals:   12/12/13 1058  BP: 153/103  Pulse: 100  Height: 5\' 8"  (1.727 m)  Weight: 159 lb (72.122 kg)    Not recorded    Body mass index is 24.18 kg/(m^2).   Generalized: In no acute distress  Neck: Supple, no carotid bruits    Cardiac: Regular rate rhythm  Pulmonary: Clear to auscultation bilaterally  Musculoskeletal: No deformity  Neurological examination  Mentation: Alert oriented to time, place, history taking, and causual conversation  Cranial nerve II-XII: Pupils were equal round reactive to light. Extraocular movements were full.  Visual field were full on confrontational test. Bilateral fundi were sharp.  Facial sensation and strength were normal. Hearing was intact to finger rubbing bilaterally. Uvula tongue midline.  Head turning and shoulder shrug and were normal and symmetric.Tongue protrusion into cheek strength was normal.  Motor: Normal tone, bulk and strength.  Sensory: Length dependent fine touch, pinprick to above ankle level, preserved vibratory sensation, and proprioception at toes.  Coordination: Normal finger to nose, heel-to-shin bilaterally there was no truncal ataxia  Gait: Rising up from seated position without assistance, normal stance, without trunk ataxia, moderate stride, good arm swing, smooth turning, able to perform tiptoe, and heel walking without difficulty.   Romberg signs: Negative  Deep tendon reflexes: She is areflexia. plantar responses were flexor bilaterally.   DIAGNOSTIC DATA (LABS, IMAGING, TESTING) - I reviewed patient records, labs, notes, testing and imaging myself where available.  Lab Results  Component Value Date   WBC 5.8 11/14/2013   HGB 12.3 11/14/2013   HCT 37.5 11/14/2013   MCV 92.1 11/14/2013   PLT 357 11/14/2013      Component Value Date/Time   NA 140 11/14/2013 1050   K 4.0 11/14/2013 1050   CL 106 11/14/2013 1050   CO2 22 11/14/2013 1050   GLUCOSE 122* 11/14/2013 1050   BUN 17 11/14/2013 1050   CREATININE 0.69 11/14/2013 1050   CREATININE 0.69 10/30/2012 1906   CREATININE 0.47 09/28/2010 1600   CALCIUM 8.9 11/14/2013 1050   PROT 6.3 10/30/2012 1906   ALBUMIN 3.2* 10/30/2012 1906   AST 19 10/30/2012 1906   ALT 20 10/30/2012 1906   ALKPHOS 88  10/30/2012 1906   BILITOT 0.4 10/30/2012 1906   GFRNONAA >90 11/14/2013 1050   GFRAA >90 11/14/2013 1050   Lab Results  Component Value Date   CHOL 193 05/25/2009   HDL 95 05/25/2009   LDLCALC 87 05/25/2009   TRIG 56 05/25/2009   CHOLHDL 2.0 Ratio 05/25/2009   Lab Results  Component Value Date   HGBA1C 13.7 10/23/2012   No results found for this basename: [id number]   Lab Results  Component Value Date   TSH 1.150 09/28/2010      ASSESSMENT AND PLAN  MONZERAT MICHAUX is a 33 y.o. female with past medical history of insulin-dependent diabetes, diabetic peripheral neuropathy, presenting with left facial pain, most consistent with left trigeminal neuralgia  1. Complete evaluation with MRI of the brain with without contrast  2. Continue Tegretol-XR 100 mg 3 times a day, refill.  3. Evaluation for her peripheral neuropathy, laboratory evaluation to rule out other possible etiology, EMG nerve conduction study    Marcial Pacas, M.D. Ph.D.  [facility] [address] [place], [place] [address] [phone]

## 2013-12-15 ENCOUNTER — Telehealth: Payer: Self-pay | Admitting: Neurology

## 2013-12-15 NOTE — Telephone Encounter (Signed)
I left a message for patient  Carrie [name]: Please call patient again, laboratory showed significant elevated A1c 12 point 8, Mild abnormal thyroid function with past, she needs to contact her primary care physician, for better control of her diabetes  will fax the result to her primary care physician

## 2013-12-15 NOTE — Telephone Encounter (Signed)
Also left message relaying significantly elevated A1c at 12.8 and mildly abnormal thyroid function, per Dr.Yan.  Asked that she give [place] a call to confirm she received this message.  Also to contact her PCP in regards to these results.

## 2013-12-18 ENCOUNTER — Telehealth: Payer: Self-pay | Admitting: Neurology

## 2013-12-18 LAB — FOLATE: FOLATE: 13.6 ng/mL (ref >3.0)

## 2013-12-18 LAB — LYME, WESTERN BLOT, SERUM (REFLEXED)
IGG P18 AB.: ABSENT
IGG P23 AB.: ABSENT
IGG P39 AB.: ABSENT
IGM P39 AB.: ABSENT
IgG P28 Ab.: ABSENT
IgG P30 Ab.: ABSENT
IgG P45 Ab.: ABSENT
IgG P58 Ab.: ABSENT
IgG P66 Ab.: ABSENT
IgG P93 Ab.: ABSENT
IgM P23 Ab.: ABSENT
LYME IGG WB: NEGATIVE
Lyme IgM Wb: NEGATIVE

## 2013-12-18 LAB — HIV ANTIBODY (ROUTINE TESTING W REFLEX): HIV 1/HIV 2 AB: NONREACTIVE

## 2013-12-18 LAB — HGB A1C W/O EAG: Hgb A1c MFr Bld: 12.8 % — ABNORMAL HIGH (ref 4.8–5.6)

## 2013-12-18 LAB — THYROID PANEL WITH TSH
Free Thyroxine Index: 0.9 — ABNORMAL LOW (ref 1.2–4.9)
T3 Uptake Ratio: 32 % (ref 24–39)
T4, Total: 2.7 ug/dL — ABNORMAL LOW (ref 4.5–12.0)
TSH: 2.1 u[IU]/mL (ref 0.450–4.500)

## 2013-12-18 LAB — VITAMIN B12: VITAMIN B 12: 673 pg/mL (ref 211–946)

## 2013-12-18 LAB — ANA W/REFLEX IF POSITIVE: ANA: NEGATIVE

## 2013-12-18 LAB — C-REACTIVE PROTEIN: CRP: 2.6 mg/L (ref 0.0–4.9)

## 2013-12-18 LAB — RPR: SYPHILIS RPR SCR: NONREACTIVE

## 2013-12-18 LAB — LYME, TOTAL AB TEST/REFLEX: LYME IGG/IGM AB: 1.02 {ISR} — AB (ref 0.00–0.90)

## 2013-12-18 NOTE — Telephone Encounter (Signed)
Follow up Sep 14

## 2013-12-29 ENCOUNTER — Ambulatory Visit (INDEPENDENT_AMBULATORY_CARE_PROVIDER_SITE_OTHER): Payer: BC Managed Care – PPO | Admitting: Neurology

## 2013-12-29 ENCOUNTER — Encounter (INDEPENDENT_AMBULATORY_CARE_PROVIDER_SITE_OTHER): Payer: Self-pay

## 2013-12-29 DIAGNOSIS — E1110 Type 2 diabetes mellitus with ketoacidosis without coma: Secondary | ICD-10-CM

## 2013-12-29 DIAGNOSIS — G500 Trigeminal neuralgia: Secondary | ICD-10-CM

## 2013-12-29 DIAGNOSIS — Z0289 Encounter for other administrative examinations: Secondary | ICD-10-CM

## 2013-12-29 DIAGNOSIS — E1310 Other specified diabetes mellitus with ketoacidosis without coma: Secondary | ICD-10-CM

## 2013-12-29 DIAGNOSIS — R209 Unspecified disturbances of skin sensation: Secondary | ICD-10-CM

## 2013-12-29 NOTE — Procedures (Signed)
   NCS (NERVE CONDUCTION STUDY) WITH EMG (ELECTROMYOGRAPHY) REPORT   STUDY DATE: Sept 14th 2015 PATIENT NAME: Carrie [name] DOB: Nov 09, 1980 MRN: [id number]    TECHNOLOGIST: Laretta Alstrom ELECTROMYOGRAPHER: Marcial Pacas M.D.  CLINICAL INFORMATION:  33 years old female, with history of diabetes, insulin-dependent, poorly controlled, with A1c of 12.8, presenting with left facial pain, bilateral feet paresthesia.  FINDINGS: NERVE CONDUCTION STUDY: Bilateral peroneal sensory responses were absent. Bilateral peroneal, tibial motor responses showed moderately decreased CMAP amplitude, with the normal range conduction velocity. Absent bilateral tibial H. reflexes.   Right ulnar sensory and motor responses were normal. Right median sensory showed mildly prolonged peak latency, with preserved snap amplitude. Right median motor responses showed mildly prolonged distal latency, mildly decreased CMAP amplitude, normal conduction velocity.   NEEDLE ELECTROMYOGRAPHY: Selected needle examination was performed at right lower extremity muscles, right upper extremity muscles, right lumbosacral paraspinal muscles.  Needle examination of right tibialis anterior, tibialis posterior, medial gastrocnemius, peroneal longus, vastus lateralis, biceps femoris short head was normal.  Right abductor pollicis longus: Increased insertion activity, no spontaneous activity, mild enlarged motor unit potential, with mildly decreased recruitment patterns.  There was no spontaneous activity at right lumbosacral paraspinal muscles, right L4-5 S1.  Right abductor pollicis brevis: Normally insertion activity, no spontaneous activity, simple morphology motor unit potential, with mildly decreased recruitment patterns.  Needle examination of right pronator teres was normal   IMPRESSION:   This is an abnormal study. 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 There was no evidence of right lumbosacral radiculopathy.   INTERPRETING PHYSICIAN:   Marcial Pacas M.D. Ph.D. [facility] [address] [place], [place] [address] [phone]

## 2013-12-29 NOTE — Progress Notes (Signed)
PATIENT: Carrie [name] DOB: October 03, 1980  HISTORICAL  Carrie [name] is 33 right-handed female, accompanied by her mother for evaluation of left facial pain, she is referred by her primary care physician  She had a past medical history of diabetes, since age 33, in 2014, her diabetes was out of control, she went into diabetic ketoacidosis, since then, she suffered bilateral lower extremity especially bilateral feet paresthesia, with diagnosis of diabetic peripheral neuropathy,  Around July 2015, she noticed intermittent shooting pain to her left cheek, left lower teeth, gradually getting worse, triggered by touching her face, biting, chewing, was initially evaluated by her dentist, no etiology found, presented to the emergency room, CAT scan was normal, even had a spinal tap,  Eventually the diagnosis of left trigeminal neuralgia was made, she was given prescription, Tegretol 100 mg tablet, which has been helpful, she is not taking 3 tablets a day, still has tinge of left discomfort, 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 Was normal TSH,.  Today's electrodiagnostic study consistent with length dependent axonal, peripheral neuropathy, there was also evidence of mild to moderate right carpal tunnel syndromes.   REVIEW OF SYSTEMS: Full 14 system review of systems performed and notable only for as above  ALLERGIES: No Known Allergies  HOME MEDICATIONS: Current  Outpatient Prescriptions on File Prior to Visit  Medication Sig Dispense Refill  . amitriptyline (ELAVIL) 50 MG tablet Take 1 tablet (50 mg total) by mouth at bedtime. PATIENT NEEDS OFFICE VISIT FOR ADDITIONAL REFILLS  30 tablet  0  . amLODipine (NORVASC) 5 MG tablet 5 mg daily.      Marland Kitchen aspirin-acetaminophen-caffeine (EXCEDRIN MIGRAINE) 250-250-65 MG per tablet Take 2 tablets by mouth every 6 (six) hours as needed for headache.      . carbamazepine (TEGRETOL-XR) 100 MG 12 hr tablet Take 1 tablet (100 mg total) by mouth 3 (three) times daily.  90 tablet  6  . cyclobenzaprine (FLEXERIL) 10 MG tablet Take 10 mg by mouth 2 (two) times daily as needed for muscle spasms.      Marland Kitchen ibuprofen (ADVIL,MOTRIN) 200 MG tablet Take 600 mg by mouth every 6 (six) hours as needed for moderate pain or cramping.       . insulin NPH-regular (NOVOLIN 70/30) (70-30) 100 UNIT/ML injection Inject 25 Units into the skin 2 (two) times daily with a meal.       . Multiple Vitamin (MULTIVITAMIN WITH MINERALS) TABS tablet Take 1 tablet by mouth daily.      . traMADol (ULTRAM) 50 MG tablet Take 100 mg by mouth every 6 (six) hours as needed for moderate pain (neuropathy).       No current facility-administered medications on file prior to visit.    PAST MEDICAL HISTORY: Past Medical History  Diagnosis Date  . Diabetes mellitus without complication   . Diabetic neuropathy   . HA (headache)     PAST SURGICAL HISTORY: Past Surgical  History  Procedure Laterality Date  . None      FAMILY HISTORY: Family History  Problem Relation Age of Onset  . Hyperlipidemia Mother   . Asthma Daughter   . Diabetes Maternal Grandmother   . Kidney disease Maternal Grandfather   . Kidney disease Paternal Grandmother     SOCIAL HISTORY:  History   Social History  . Marital Status: Single    Spouse Name: N/A    Number of Children: 1  . Years of Education: 13   Occupational History  . Call Center    Social History Main Topics    . Smoking status: Never Smoker   . Smokeless tobacco: Never Used  . Alcohol Use: 2.0 oz/week    4 drink(s) per week     Comment: 2-3 a week  . Drug Use: No  . Sexual Activity: Yes   Other Topics Concern  . Not on file   Social History Narrative   Patient lives at home with her father and daughter. Patient works full time at [employer] .   Education some college.   Right handed   Caffeine Five sodas daily.     PHYSICAL EXAM   There were no vitals filed for this visit.  Not recorded    There is no weight on file to calculate BMI.   Generalized: In no acute distress  Neck: Supple, no carotid bruits   Cardiac: Regular rate rhythm  Pulmonary: Clear to auscultation bilaterally  Musculoskeletal: No deformity  Neurological examination  Mentation: Alert oriented to time, place, history taking, and causual conversation  Cranial nerve II-XII: Pupils were equal round reactive to light. Extraocular movements were full.  Visual field were full on confrontational test. Bilateral fundi were sharp.  Facial sensation and strength were normal. Hearing was intact to finger rubbing bilaterally. Uvula tongue midline.  Head turning and shoulder shrug and were normal and symmetric.Tongue protrusion into cheek strength was normal.  Motor: Normal tone, bulk and strength.  Sensory: Length dependent fine touch, pinprick to above ankle level, preserved vibratory sensation, and proprioception at toes.  Coordination: Normal finger to nose, heel-to-shin bilaterally there was no truncal ataxia  Gait: Rising up from seated position without assistance, normal stance, without trunk ataxia, moderate stride, good arm swing, smooth turning, able to perform tiptoe, and heel walking without difficulty.   Romberg signs: Negative  Deep tendon reflexes: She is areflexia. plantar responses were flexor bilaterally.   DIAGNOSTIC DATA (LABS, IMAGING, TESTING) - I reviewed patient records, labs, notes, testing  and imaging myself where available.  Lab Results  Component Value Date   WBC 5.8 11/14/2013   HGB 12.3 11/14/2013   HCT 37.5 11/14/2013   MCV 92.1 11/14/2013   PLT 357 11/14/2013      Component Value Date/Time   NA 140 11/14/2013 1050   K 4.0 11/14/2013 1050   CL 106 11/14/2013 1050   CO2 22 11/14/2013 1050   GLUCOSE 122* 11/14/2013 1050   BUN 17 11/14/2013 1050   CREATININE 0.69 11/14/2013 1050   CREATININE 0.69 10/30/2012 1906   CREATININE 0.47 09/28/2010 1600   CALCIUM 8.9 11/14/2013 1050   PROT 6.3 10/30/2012 1906   ALBUMIN 3.2* 10/30/2012 1906   AST 19 10/30/2012 1906   ALT 20 10/30/2012 1906   ALKPHOS 88 10/30/2012 1906   BILITOT 0.4 10/30/2012 1906   GFRNONAA >90 11/14/2013 1050   GFRAA >90 11/14/2013 1050   Lab Results  Component Value Date   CHOL 193 05/25/2009  HDL 95 05/25/2009   LDLCALC 87 05/25/2009   TRIG 56 05/25/2009   CHOLHDL 2.0 Ratio 05/25/2009   Lab Results  Component Value Date   HGBA1C 12.8* 12/12/2013   Lab Results  Component Value Date   VITAMINB12 673 12/12/2013   Lab Results  Component Value Date   TSH 2.100 12/12/2013      ASSESSMENT AND PLAN  Carrie [name] is a 33 y.o. female with past medical history of insulin-dependent diabetes, diabetic peripheral neuropathy, presenting with left facial pain, most consistent with left trigeminal neuralgia  1. continue MRI of the brain with without contrast 2. Continue Tegretol-XR 100 mg 3 times a day, refill. Elavil 50 mg every night 3. Fax laboratory result to her primary care Dr. Alonna Buckler, better controlled diabetes, further evaluation of possible hypothyroidism. 4. Return to clinic in 3 months    Marcial Pacas, M.D. Ph.D.  [facility] [address] [place], [place] [address] [phone]

## 2013-12-30 DIAGNOSIS — Z0289 Encounter for other administrative examinations: Secondary | ICD-10-CM

## 2014-01-06 ENCOUNTER — Telehealth: Payer: Self-pay | Admitting: Neurology

## 2014-01-06 NOTE — Telephone Encounter (Signed)
Pt called to cancel her NP appt w/ Dr. Tomi Likens on 01/07/14. She was able to be seen sooner at [facility].  [facility] was the one to refer her.

## 2014-01-07 ENCOUNTER — Ambulatory Visit: Payer: No Typology Code available for payment source | Admitting: Neurology

## 2014-01-15 ENCOUNTER — Telehealth: Payer: Self-pay | Admitting: *Deleted

## 2014-01-16 ENCOUNTER — Telehealth: Payer: Self-pay | Admitting: *Deleted

## 2014-01-16 NOTE — Telephone Encounter (Signed)
Patient calling about her form.

## 2014-01-16 NOTE — Telephone Encounter (Signed)
Management is handling the form.

## 2014-01-19 NOTE — Telephone Encounter (Signed)
I received Forms from Medical Records and I have placed them in the Providers file and have placed them on his assistants In-Box to be completed. Patient has called asking about this form.

## 2014-01-22 NOTE — Telephone Encounter (Signed)
Dr.Yan has form to look over and sign.

## 2014-01-23 NOTE — Telephone Encounter (Signed)
Called patient to let her no her form was up for review we will call her next week with completed form Patient understood and she was fine with this.

## 2014-01-27 NOTE — Telephone Encounter (Signed)
I called the patient and confirmed with her that her form has been faxed to [facility] at Fax#:  [phone].  Forms completed and were taken to our medical record department for processing.

## 2014-02-23 ENCOUNTER — Encounter: Payer: Self-pay | Admitting: Neurology

## 2014-04-08 ENCOUNTER — Ambulatory Visit: Payer: BC Managed Care – PPO | Admitting: Nurse Practitioner

## 2014-04-12 ENCOUNTER — Encounter (HOSPITAL_COMMUNITY): Payer: Self-pay | Admitting: *Deleted

## 2014-04-12 ENCOUNTER — Emergency Department (HOSPITAL_COMMUNITY)
Admission: EM | Admit: 2014-04-12 | Discharge: 2014-04-12 | Disposition: A | Discharge status: Home / Self Care | Payer: BC Managed Care – PPO | Attending: Emergency Medicine | Admitting: Emergency Medicine

## 2014-04-12 ENCOUNTER — Emergency Department (HOSPITAL_COMMUNITY): Payer: BC Managed Care – PPO

## 2014-04-12 DIAGNOSIS — E1140 Type 2 diabetes mellitus with diabetic neuropathy, unspecified: Secondary | ICD-10-CM | POA: Diagnosis not present

## 2014-04-12 DIAGNOSIS — Z79899 Other long term (current) drug therapy: Secondary | ICD-10-CM | POA: Insufficient documentation

## 2014-04-12 DIAGNOSIS — Z791 Long term (current) use of non-steroidal anti-inflammatories (NSAID): Secondary | ICD-10-CM | POA: Diagnosis not present

## 2014-04-12 DIAGNOSIS — L02611 Cutaneous abscess of right foot: Secondary | ICD-10-CM | POA: Diagnosis not present

## 2014-04-12 DIAGNOSIS — Z794 Long term (current) use of insulin: Secondary | ICD-10-CM | POA: Diagnosis not present

## 2014-04-12 LAB — CBC WITH DIFFERENTIAL/PLATELET
BASOS PCT: 0 % (ref 0–1)
Basophils Absolute: 0 10*3/uL (ref 0.0–0.1)
EOS ABS: 0.1 10*3/uL (ref 0.0–0.7)
EOS PCT: 2 % (ref 0–5)
HCT: 38.1 % (ref 36.0–46.0)
HEMOGLOBIN: 12.6 g/dL (ref 12.0–15.0)
Lymphocytes Relative: 20 % (ref 12–46)
Lymphs Abs: 1.6 10*3/uL (ref 0.7–4.0)
MCH: 30.5 pg (ref 26.0–34.0)
MCHC: 33.1 g/dL (ref 30.0–36.0)
MCV: 92.3 fL (ref 78.0–100.0)
MONOS PCT: 5 % (ref 3–12)
Monocytes Absolute: 0.4 10*3/uL (ref 0.1–1.0)
NEUTROS PCT: 73 % (ref 43–77)
Neutro Abs: 5.9 10*3/uL (ref 1.7–7.7)
PLATELETS: 366 10*3/uL (ref 150–400)
RBC: 4.13 MIL/uL (ref 3.87–5.11)
RDW: 12.5 % (ref 11.5–15.5)
WBC: 8.1 10*3/uL (ref 4.0–10.5)

## 2014-04-12 LAB — BASIC METABOLIC PANEL
Anion gap: 6 (ref 5–15)
BUN: 13 mg/dL (ref 6–23)
CO2: 27 mmol/L (ref 19–32)
Calcium: 9.7 mg/dL (ref 8.4–10.5)
Chloride: 106 mEq/L (ref 96–112)
Creatinine, Ser: 0.77 mg/dL (ref 0.50–1.10)
Glucose, Bld: 103 mg/dL — ABNORMAL HIGH (ref 70–99)
Potassium: 3.8 mmol/L (ref 3.5–5.1)
Sodium: 139 mmol/L (ref 135–145)

## 2014-04-12 LAB — I-STAT CG4 LACTIC ACID, ED: Lactic Acid, Venous: 1.52 mmol/L (ref 0.5–2.2)

## 2014-04-12 MED ORDER — CLINDAMYCIN HCL 150 MG PO CAPS: 300.0000 mg | ORAL_CAPSULE | Freq: Three times a day (TID) | ORAL | Status: DC

## 2014-04-12 MED ORDER — OXYCODONE-ACETAMINOPHEN 5-325 MG PO TABS: 1.0000 | ORAL_TABLET | Freq: Four times a day (QID) | ORAL | Status: DC | PRN

## 2014-04-12 MED ORDER — OXYCODONE-ACETAMINOPHEN 5-325 MG PO TABS
1.0000 | ORAL_TABLET | Freq: Once | ORAL | Status: AC
  Administered 2014-04-12: 1 via ORAL
  Filled 2014-04-12: qty 1

## 2014-04-12 MED ORDER — LIDOCAINE-EPINEPHRINE 2 %-1:100000 IJ SOLN
20.0000 mL | Freq: Once | INTRAMUSCULAR | Status: AC
  Administered 2014-04-12: 20 mL via INTRADERMAL
  Filled 2014-04-12: qty 1

## 2014-04-12 MED ORDER — CLINDAMYCIN PHOSPHATE 600 MG/50ML IV SOLN
600.0000 mg | Freq: Once | INTRAVENOUS | Status: AC
  Administered 2014-04-12: 600 mg via INTRAVENOUS
  Filled 2014-04-12: qty 50

## 2014-04-12 NOTE — ED Provider Notes (Signed)
CSN: [id number]     Arrival date & time 04/12/14  1437 History   First MD Initiated Contact with Patient 04/12/14 1741     Chief Complaint  Patient presents with  . foot abscess      (Consider location/radiation/quality/duration/timing/severity/associated sxs/prior Treatment) HPI Carrie [name] is a 33 y.o. female with history of diabetes and diabetic peripheral neuropathy, presents to emergency department complaining of a wound to the right heel. He states that she thinks she may have stepped on something about a week ago, but states she has no feeling in her feet so she is not sure. She developed some tenderness in that area about a week ago, states she also has used her pelvis stone so not sure if she rubbed it too hard. 2 days ago she noticed a pus filled wound to the area. States it has since gotten larger, red, warm. She denies any fever or chills. States her blood sugars usually run in 200s. She denies any known injuries. She states she had some amoxicillin left over, states she took 4 tablets total with no improvement. Pain is worsened with palpation and walking. Nothing makes it better. She denies any other complaints.  Past Medical History  Diagnosis Date  . Diabetes mellitus without complication   . Diabetic neuropathy   . HA (headache)    Past Surgical History  Procedure Laterality Date  . None     Family History  Problem Relation Age of Onset  . Hyperlipidemia Mother   . Asthma Daughter   . Diabetes Maternal Grandmother   . Kidney disease Maternal Grandfather   . Kidney disease Paternal Grandmother    History  Substance Use Topics  . Smoking status: Never Smoker   . Smokeless tobacco: Never Used  . Alcohol Use: 2.0 oz/week    4 drink(s) per week     Comment: 2-3 a week   OB History    No data available     Review of Systems  Constitutional: Negative for fever and chills.  Respiratory: Negative for cough, chest tightness and shortness of breath.    Cardiovascular: Negative for chest pain, palpitations and leg swelling.  Gastrointestinal: Negative for nausea, vomiting, abdominal pain and diarrhea.  Genitourinary: Negative for dysuria and flank pain.  Musculoskeletal: Positive for myalgias. Negative for neck pain and neck stiffness.  Skin: Positive for wound. Negative for rash.  Neurological: Negative for dizziness, weakness and headaches.  All other systems reviewed and are negative.     Allergies  Review of patient's allergies indicates no known allergies.  Home Medications   Prior to Admission medications   Medication Sig Start Date End Date Taking? Authorizing Provider  amitriptyline (ELAVIL) 50 MG tablet Take 1 tablet (50 mg total) by mouth at bedtime. PATIENT NEEDS OFFICE VISIT FOR ADDITIONAL REFILLS 04/15/13  Yes Robyn Haber, MD  aspirin-acetaminophen-caffeine [facility] MIGRAINE) [phone] MG per tablet Take 2 tablets by mouth every 6 (six) hours as needed for headache.   Yes Historical Provider, MD  cyclobenzaprine (FLEXERIL) 10 MG tablet Take 10 mg by mouth 2 (two) times daily as needed for muscle spasms.   Yes Historical Provider, MD  ibuprofen (ADVIL,MOTRIN) 200 MG tablet Take 600 mg by mouth every 6 (six) hours as needed for moderate pain or cramping.    Yes Historical Provider, MD  insulin NPH-regular (NOVOLIN 70/30) (70-30) 100 UNIT/ML injection Inject 35 Units into the skin 2 (two) times daily with a meal.    Yes Historical Provider, MD  naproxen sodium (ANAPROX) 550 MG tablet Take 1 tablet by mouth 2 (two) times daily. 04/08/14  Yes Historical Provider, MD  traMADol (ULTRAM) 50 MG tablet Take 100 mg by mouth every 6 (six) hours as needed for moderate pain (neuropathy).   Yes Historical Provider, MD  carbamazepine (TEGRETOL-XR) 100 MG 12 hr tablet Take 1 tablet (100 mg total) by mouth 3 (three) times daily. Patient not taking: Reported on 04/12/2014 12/12/13   Marcial Pacas, MD   BP 142/95 mmHg  Pulse 109  Temp(Src)  97.8 F (36.6 C) (Oral)  Resp 18  Ht 5\' 8"  (1.727 m)  Wt 174 lb (78.926 kg)  BMI 26.46 kg/m2  SpO2 100%  LMP 04/03/2014 Physical Exam  Constitutional: She is oriented to person, place, and time. She appears well-developed and well-nourished. No distress.  HENT:  Head: Normocephalic.  Eyes: Conjunctivae are normal.  Neck: Neck supple.  Cardiovascular: Normal rate, regular rhythm and normal heart sounds.   Pulmonary/Chest: Effort normal and breath sounds normal. No respiratory distress. She has no wheezes. She has no rales.  Abdominal: Soft. Bowel sounds are normal. She exhibits no distension. There is no tenderness. There is no rebound.  Musculoskeletal: She exhibits no edema.  Access to the right lateral heel with surrounding cellulitis extending over the heel up to the lateral malleolus and over the lateral foot. Area is tender to palpation. It is warm to the touch. No drainage. Mild swelling over entire right foot and ankle, with no pain with movement or tenderness to palpation.  Neurological: She is alert and oriented to person, place, and time.  Skin: Skin is warm and dry.  Psychiatric: She has a normal mood and affect. Her behavior is normal.  Nursing note and vitals reviewed.   ED Course  Procedures (including critical care time) Labs Review Labs Reviewed  WOUND CULTURE  CBC WITH DIFFERENTIAL  BASIC METABOLIC PANEL  I-STAT CG4 LACTIC ACID, ED    Imaging Review Dg Foot Complete Right  04/12/2014   CLINICAL DATA:  Right foot abscess laterally, initial encounter  EXAM: RIGHT FOOT COMPLETE - 3+ VIEW  COMPARISON:  None.  FINDINGS: Mild soft tissue swelling is noted. No bony abnormality is seen. No radiopaque foreign body is noted.  IMPRESSION: Soft tissue swelling without acute abnormality.   Electronically Signed   By: Inez Catalina M.D.   On: 04/12/2014 16:30    INCISION AND DRAINAGE Performed by: Jeannett Senior A Consent: Verbal consent obtained. Risks and  benefits: risks, benefits and alternatives were discussed Type: abscess  Body area: right heel  Anesthesia: local infiltration  Incision was made with a scalpel.  Local anesthetic: lidocaine 2% w epinephrine  Anesthetic total: 3 ml  Complexity: complex Blunt dissection to break up loculations  Drainage: purulent  Drainage amount: moderate  Packing material: 1/4 in iodoform gauze  Patient tolerance: Patient tolerated the procedure well with no immediate complications.     MDM   Final diagnoses:  Abscess of heel, right    Pt with right foot abscess and surrounding cellulitis. WIll get labs. Xray negative. Will I&D, cultures will be sent. Pt afebrile, slightly tachycardic. Non toxic.   7:58 PM Pt received clindamycin 600mg  IV in ED. Wound incised and drained. Appears to be more of infected blister/calluce. Labs all normal. No elevated WBC, lactic acid normal, blood sugar normal. Pt offered admission however she would rather go home. Will try a trial of oral antibiotic tx with recheck in 48 hrs. Discussed plan and  return precautions with pt, and she voiced understanding.   Filed Vitals:   04/12/14 1512 04/12/14 1747 04/12/14 2016  BP: 153/103 142/95 151/102  Pulse: 115 109 108  Temp: 97.8 F (36.6 C)    TempSrc: Oral    Resp: 18 18 18   Height: 5\' 8"  (1.727 m)    Weight: 174 lb (78.926 kg)    SpO2: 100% 100% 100%     Renold Genta, PA-C 04/12/14 2256  Virgel Manifold, MD 04/13/14 1455

## 2014-04-12 NOTE — Discharge Instructions (Signed)
Keep foot elevated. Wash with soap and water. Warm compresses or soaks with soapy water. Clindamycin as prescribed until all gone. Ibuprofen for pain. Percocet for severe pain. Keep close eye on the foot, if worsening return to ER, otherwise follow up with your doctor or here in 2 days for recheck.    Abscess Care After An abscess (also called a boil or furuncle) is an infected area that contains a collection of pus. Signs and symptoms of an abscess include pain, tenderness, redness, or hardness, or you may feel a moveable soft area under your skin. An abscess can occur anywhere in the body. The infection may spread to surrounding tissues causing cellulitis. A cut (incision) by the surgeon was made over your abscess and the pus was drained out. Gauze may have been packed into the space to provide a drain that will allow the cavity to heal from the inside outwards. The boil may be painful for 5 to 7 days. Most people with a boil do not have high fevers. Your abscess, if seen early, may not have localized, and may not have been lanced. If not, another appointment may be required for this if it does not get better on its own or with medications. HOME CARE INSTRUCTIONS   Only take over-the-counter or prescription medicines for pain, discomfort, or fever as directed by your caregiver.  When you bathe, soak and then remove gauze or iodoform packs at least daily or as directed by your caregiver. You may then wash the wound gently with mild soapy water. Repack with gauze or do as your caregiver directs. SEEK IMMEDIATE MEDICAL CARE IF:   You develop increased pain, swelling, redness, drainage, or bleeding in the wound site.  You develop signs of generalized infection including muscle aches, chills, fever, or a general ill feeling.  An oral temperature above 102 F (38.9 C) develops, not controlled by medication. See your caregiver for a recheck if you develop any of the symptoms described above. If  medications (antibiotics) were prescribed, take them as directed. Document Released: 10/20/2004 Document Revised: 06/26/2011 Document Reviewed: 06/17/2007 [facility] Patient Information 2015 [place], [place]. This information is not intended to replace advice given to you by your health care provider. Make sure you discuss any questions you have with your health care provider.  Cellulitis Cellulitis is an infection of the skin and the tissue beneath it. The infected area is usually red and tender. Cellulitis occurs most often in the arms and lower legs.  CAUSES  Cellulitis is caused by bacteria that enter the skin through cracks or cuts in the skin. The most common types of bacteria that cause cellulitis are staphylococci and streptococci. SIGNS AND SYMPTOMS   Redness and warmth.  Swelling.  Tenderness or pain.  Fever. DIAGNOSIS  Your health care provider can usually determine what is wrong based on a physical exam. Blood tests may also be done. TREATMENT  Treatment usually involves taking an antibiotic medicine. HOME CARE INSTRUCTIONS   Take your antibiotic medicine as directed by your health care provider. Finish the antibiotic even if you start to feel better.  Keep the infected arm or leg elevated to reduce swelling.  Apply a warm cloth to the affected area up to 4 times per day to relieve pain.  Take medicines only as directed by your health care provider.  Keep all follow-up visits as directed by your health care provider. SEEK MEDICAL CARE IF:   You notice red streaks coming from the infected area.  Your red area gets larger or turns dark in color.  Your bone or joint underneath the infected area becomes painful after the skin has healed.  Your infection returns in the same area or another area.  You notice a swollen bump in the infected area.  You develop new symptoms.  You have a fever. SEEK IMMEDIATE MEDICAL CARE IF:   You feel very sleepy.  You develop  vomiting or diarrhea.  You have a general ill feeling (malaise) with muscle aches and pains. MAKE SURE YOU:   Understand these instructions.  Will watch your condition.  Will get help right away if you are not doing well or get worse. Document Released: 01/11/2005 Document Revised: 08/18/2013 Document Reviewed: 06/19/2011 [facility] Patient Information 2015 [place], [place]. This information is not intended to replace advice given to you by your health care provider. Make sure you discuss any questions you have with your health care provider.

## 2014-04-12 NOTE — ED Notes (Signed)
Pt reports hx of diabetes, has foot neuropathy, reports she stepped on something 1 week ago, now has abscess to bottom or right foot. Pain 8/10.

## 2014-04-14 ENCOUNTER — Encounter (HOSPITAL_COMMUNITY): Payer: Self-pay | Admitting: Emergency Medicine

## 2014-04-14 ENCOUNTER — Emergency Department (HOSPITAL_COMMUNITY)
Admission: EM | Admit: 2014-04-14 | Discharge: 2014-04-15 | Disposition: A | Discharge status: Home / Self Care | Payer: BC Managed Care – PPO | Attending: Emergency Medicine | Admitting: Emergency Medicine

## 2014-04-14 DIAGNOSIS — Z79899 Other long term (current) drug therapy: Secondary | ICD-10-CM | POA: Diagnosis not present

## 2014-04-14 DIAGNOSIS — E119 Type 2 diabetes mellitus without complications: Secondary | ICD-10-CM | POA: Diagnosis not present

## 2014-04-14 DIAGNOSIS — Z794 Long term (current) use of insulin: Secondary | ICD-10-CM | POA: Diagnosis not present

## 2014-04-14 DIAGNOSIS — Z5189 Encounter for other specified aftercare: Secondary | ICD-10-CM

## 2014-04-14 DIAGNOSIS — L02611 Cutaneous abscess of right foot: Secondary | ICD-10-CM | POA: Insufficient documentation

## 2014-04-14 MED ORDER — LIDOCAINE HCL (PF) 1 % IJ SOLN
5.0000 mL | Freq: Once | INTRAMUSCULAR | Status: DC
  Filled 2014-04-14: qty 5

## 2014-04-14 NOTE — ED Provider Notes (Signed)
CSN: [id number]     Arrival date & time 04/14/14  2035 History   This chart was scribed for non-physician practitioner, Britt Bottom, NP working with Evelina Bucy, MD, by Chester Holstein, ED Scribe. This patient was seen in room WTR8/WTR8 and the patient's care was started at 10:15 PM.     Chief Complaint  Patient presents with  . Abscess    Patient is a 33 y.o. female presenting with abscess. The history is provided by the patient. No language interpreter was used.  Abscess Associated symptoms: no fever, no headaches, no nausea and no vomiting     HPI Comments: Carrie [name] is a 33 y.o. female PMHx of IDDM and neuropathy who presents to the Emergency Department complaining of foot pain due to abscess on the lateral plantar portion of her right foot.  Pt notes swelling in her right foot.  She states her foot and ankle were swollen PTA but she has wrapped it and swelling has reduced. Pt notes she cannot see the abscess due to its location. Pt states she was seen in the ED 2 days ago and prescribed Abx, but notes the pain has worsened since. She took the first dose yesterday but has not taken the second yet.  She notes she also has not taken her insulin today. Pt denies fever. Pt sees a PCP at [facility]  Past Medical History  Diagnosis Date  . Diabetes mellitus without complication   . Diabetic neuropathy   . HA (headache)    Past Surgical History  Procedure Laterality Date  . None     Family History  Problem Relation Age of Onset  . Hyperlipidemia Mother   . Asthma Daughter   . Diabetes Maternal Grandmother   . Kidney disease Maternal Grandfather   . Kidney disease Paternal Grandmother    History  Substance Use Topics  . Smoking status: Never Smoker   . Smokeless tobacco: Never Used  . Alcohol Use: 2.0 oz/week    4 drink(s) per week     Comment: 2-3 a week   OB History    No data available     Review of Systems  Constitutional:  Negative for fever.  Gastrointestinal: Negative for nausea, vomiting and diarrhea.  Musculoskeletal: Positive for myalgias.  Skin: Positive for wound.       abscess  Neurological: Positive for numbness. Negative for weakness and headaches.      Allergies  Review of patient's allergies indicates no known allergies.  Home Medications   Prior to Admission medications   Medication Sig Start Date End Date Taking? Authorizing Provider  amitriptyline (ELAVIL) 50 MG tablet Take 1 tablet (50 mg total) by mouth at bedtime. PATIENT NEEDS OFFICE VISIT FOR ADDITIONAL REFILLS 04/15/13   Robyn Haber, MD  aspirin-acetaminophen-caffeine [facility] MIGRAINE) [phone] MG per tablet Take 2 tablets by mouth every 6 (six) hours as needed for headache.    Historical Provider, MD  carbamazepine (TEGRETOL-XR) 100 MG 12 hr tablet Take 1 tablet (100 mg total) by mouth 3 (three) times daily. Patient not taking: Reported on 04/12/2014 12/12/13   Marcial Pacas, MD  clindamycin (CLEOCIN) 150 MG capsule Take 2 capsules (300 mg total) by mouth 3 (three) times daily. 04/12/14   Tatyana A Kirichenko, PA-C  cyclobenzaprine (FLEXERIL) 10 MG tablet Take 10 mg by mouth 2 (two) times daily as needed for muscle spasms.    Historical Provider, MD  ibuprofen (ADVIL,MOTRIN) 200 MG tablet Take 600 mg by mouth  every 6 (six) hours as needed for moderate pain or cramping.     Historical Provider, MD  insulin NPH-regular (NOVOLIN 70/30) (70-30) 100 UNIT/ML injection Inject 35 Units into the skin 2 (two) times daily with a meal.     Historical Provider, MD  naproxen sodium (ANAPROX) 550 MG tablet Take 1 tablet by mouth 2 (two) times daily. 04/08/14   Historical Provider, MD  oxyCODONE-acetaminophen (PERCOCET) 5-325 MG per tablet Take 1 tablet by mouth every 6 (six) hours as needed for severe pain. 04/12/14   Tatyana A Kirichenko, PA-C  traMADol (ULTRAM) 50 MG tablet Take 100 mg by mouth every 6 (six) hours as needed for moderate pain  (neuropathy).    Historical Provider, MD   BP 152/99 mmHg  Pulse 115  Temp(Src) 97.9 F (36.6 C) (Oral)  Resp 17  SpO2 100%  LMP 04/03/2014 Physical Exam  Constitutional: She is oriented to person, place, and time. She appears well-developed and well-nourished.  HENT:  Head: Normocephalic.  Eyes: Conjunctivae are normal.  Neck: Normal range of motion. Neck supple.  Pulmonary/Chest: Effort normal.  Musculoskeletal: Normal range of motion.  Lateral plantar aspect of right foot near the heel: 4 cm in diameter area of hypopigmented skin with small amount of purulence noted at the opening. Mild erythema surrounding.  Bilateral peripheral edema  Neurological: She is alert and oriented to person, place, and time.  decreased sensation at baseline due to peripheral neuropathy  Skin: Skin is warm and dry.  Psychiatric: She has a normal mood and affect. Her behavior is normal.  Nursing note and vitals reviewed.   ED Course  Procedures (including critical care time) DIAGNOSTIC STUDIES: Oxygen Saturation is 100% on room air, normal by my interpretation.    COORDINATION OF CARE: 10:21 PM Discussed treatment plan with patient at beside, the patient agrees with the plan and has no further questions at this time.   Labs Review Labs Reviewed - No data to display  Imaging Review No results found.   EKG Interpretation None      INCISION AND DRAINAGE PROCEDURE NOTE: Patient identification was confirmed and verbal consent was obtained. This procedure was performed by Britt Bottom, NP at 11:15 PM. Site: Lateral plantar aspect of right foot Sterile procedures observed  Anesthetic used (type and amt): local infiltration of 3 mL of 1% lidocaine Drainage: no drainage noted Complexity: Complex Packing used: 1/4 in iodoform gauze  Site anesthetized, incision made over site, wound drained and explored loculations, rinsed with copious amounts of normal saline, wound packed with sterile  gauze, covered with dry, sterile dressing.  Pt tolerated procedure well without complications.  Instructions for care discussed verbally and pt provided with additional written instructions for homecare and f/u.   MDM   Final diagnoses:  Encounter for wound re-check   33 yo with poor medication adherence for diabetes and hypertension presenting with recheck of wound on her foot.  She was seen 2 days ago with I & D of large callous on right foot. Discussed case with Dr. Mingo Amber.  Packing removed and wound irrigated, new packing placed. No cellulitis noted. Discussed with pt to continue antibiotics and importance of following up with PCP to arrange outpt wound care follow-up. Pt is well-appearing, in no acute distress and vital signs are stable.  They appear safe to be discharged.  Return precautions provided.     I personally performed the services described in this documentation, which was scribed in my presence. The recorded information has  been reviewed and is accurate.  Filed Vitals:   04/14/14 2056 04/14/14 2359  BP: 152/99 150/100  Pulse: 115 110  Temp: 97.9 F (36.6 C)   TempSrc: Oral   Resp: 17 18  SpO2: 100% 100%   Meds given in ED:  Medications - No data to display  Discharge Medication List as of 04/14/2014 11:35 PM         Britt Bottom, NP 04/15/14 [place], MD 04/15/14 [phone]

## 2014-04-14 NOTE — ED Notes (Signed)
Pt seen in ED for abscess on Sunday. Packing was placed and needs to be removed.

## 2014-04-14 NOTE — Discharge Instructions (Signed)
Please follow the directions provided.  It is very important to follow-up with Dr. Kennon Holter for a referral to a wound care clinic for your foot and for management of your chronic medical problems.  Don't hesitate to return for any new, worsening or concerning symptoms.    SEEK IMMEDIATE MEDICAL CARE IF:  You have an injury or infection that is not healing.  You feel very dizzy or begin vomiting.  You have chest pain.  You have trouble breathing.

## 2014-04-15 ENCOUNTER — Telehealth (HOSPITAL_BASED_OUTPATIENT_CLINIC_OR_DEPARTMENT_OTHER): Payer: Self-pay | Admitting: Emergency Medicine

## 2014-04-15 LAB — WOUND CULTURE: Gram Stain: NONE SEEN

## 2014-04-17 ENCOUNTER — Telehealth: Payer: Self-pay | Admitting: *Deleted

## 2014-04-17 NOTE — ED Notes (Signed)
(+)  wound culture treated with clindamycin, OK per J. Judie Bonus, Pharm

## 2014-04-20 ENCOUNTER — Ambulatory Visit: Payer: BC Managed Care – PPO | Admitting: Neurology

## 2014-04-24 ENCOUNTER — Encounter (HOSPITAL_BASED_OUTPATIENT_CLINIC_OR_DEPARTMENT_OTHER): Payer: Self-pay | Attending: Internal Medicine

## 2014-04-24 DIAGNOSIS — L97419 Non-pressure chronic ulcer of right heel and midfoot with unspecified severity: Secondary | ICD-10-CM | POA: Insufficient documentation

## 2014-04-24 DIAGNOSIS — E11621 Type 2 diabetes mellitus with foot ulcer: Secondary | ICD-10-CM | POA: Insufficient documentation

## 2014-04-24 DIAGNOSIS — G500 Trigeminal neuralgia: Secondary | ICD-10-CM | POA: Insufficient documentation

## 2014-04-24 DIAGNOSIS — L03116 Cellulitis of left lower limb: Secondary | ICD-10-CM | POA: Insufficient documentation

## 2014-04-24 DIAGNOSIS — L03115 Cellulitis of right lower limb: Secondary | ICD-10-CM | POA: Insufficient documentation

## 2014-04-24 DIAGNOSIS — Z794 Long term (current) use of insulin: Secondary | ICD-10-CM | POA: Insufficient documentation

## 2014-04-24 DIAGNOSIS — A4902 Methicillin resistant Staphylococcus aureus infection, unspecified site: Secondary | ICD-10-CM | POA: Insufficient documentation

## 2014-04-24 DIAGNOSIS — E1140 Type 2 diabetes mellitus with diabetic neuropathy, unspecified: Secondary | ICD-10-CM | POA: Insufficient documentation

## 2014-04-25 NOTE — Progress Notes (Signed)
[facility]  NAME:  Carrie [name], Carrie [name]                ACCOUNT NO.:  [id number]  MEDICAL RECORD NO.:  [id number]      DATE OF BIRTH:  [birth year]  PHYSICIAN:  Ricard Dillon, M.D. VISIT DATE:  04/24/2014                                  OFFICE VISIT   Carrie [name] is a 34 year old woman with a history of diabetic and diabetic peripheral neuropathy.  She tells me that 2 days after Christmas on the 27th, she noted sudden swelling and pain of her right foot, was able to show me a picture on her phone.  There was no history of trauma.  She was on aware of how this had happened.  She suddenly found her legs swollen and painful.  She presented to the emergency room for evaluation of this on April 12, 2014.  She was noted to have an abscess in the right lateral heel with surrounding cellulitis extending over the heel up to the lateral malleolus and over the lateral foot.  The area was tender.  I believe she underwent an I and D of this area.  Cultures ultimately showed MRSA, and she was treated with clindamycin IV in the emergency room and orally, which she continues on.  I believe she made a second trip on the 29th and had a repeat I and D done.  Again, there was purulent drainage and some erythema, although the sound of this was that it is improved.  Her packing from the original I and D was removed.  The wound was irrigated and packed again.  Apparently a week later, the patient removed her own packing, and she is here in followup.  PAST MEDICAL HISTORY: 1. Type 1 diabetes since age 13 with diabetic neuropathy. 2. Hypertension. 3. Headaches. 4. Trigeminal neuralgia. 5. Cellulitis of the right lower extremity.  PAST PROCEDURES:  I and D x2 of the right lateral heel as noted.  CURRENT MEDICATIONS: 1. Cleocin 150 mg 2 tablets 3 times a day. 2. Insulin Novolin 70/30, 35 units b.i.d. 3. Oxycodone p.r.n.  PHYSICAL EXAMINATION:  VITAL SIGNS:  Temperature 98.8, pulse  107, respirations 20, blood pressure 153/92, glucose at 137 a.m., weight at 174 pounds. Her ABIs as calculated in this clinic were 1.3 on the right.  Peripheral pulses were palpable.  There was no evidence of an ischemic component. NEUROLOGIC:  Slightly decreased sensation to the microfilament test, decreased vibration sense was noted.  On the right lateral heel, there was a small open area measuring 0.7 x 0.3 x 0.1.  There is still some fluctuation in the area, although I did not see anything that could be easily drained or opened.  The degree of erythema that was described on April 12, 2014, is considerably better.  I did not think that anything further needed to be done from a surgical point of view.  IMPRESSION: 1. Diabetic foot abscess, right lateral heel as described.  Culture of     this grew MRSA.  She has tolerated the clindamycin well, however, I     will change her to doxycycline for further week.  We dressed the     remaining area with silver alginate and a Kerlix wrap. 2. Numerous healed excoriations on her left greater than right leg.  The patient tells me she scratches herself incessantly, these look     like perhaps healed areas of folliculitis.  The patient really     denies this, although I would have to wonder now that she has a     history of documented MRSA whether this is actually the problem.  I     have cautioned her about scratching open wounds or new areas on her     legs or arms and to pay particular attention with razors, etc. We will follow her again in a week.          ______________________________ Ricard Dillon, M.D.     MGR/MEDQ  D:  04/24/2014  T:  04/25/2014  Job:  [id number]

## 2015-04-03 ENCOUNTER — Observation Stay (HOSPITAL_COMMUNITY)
Admission: EM | Admit: 2015-04-03 | Discharge: 2015-04-04 | Disposition: A | Discharge status: Home / Self Care | Payer: No Typology Code available for payment source | Attending: Internal Medicine | Admitting: Internal Medicine

## 2015-04-03 ENCOUNTER — Encounter (HOSPITAL_COMMUNITY): Payer: Self-pay | Admitting: Emergency Medicine

## 2015-04-03 DIAGNOSIS — Z91199 Patient's noncompliance with other medical treatment and regimen due to unspecified reason: Secondary | ICD-10-CM

## 2015-04-03 DIAGNOSIS — G500 Trigeminal neuralgia: Secondary | ICD-10-CM

## 2015-04-03 DIAGNOSIS — Z792 Long term (current) use of antibiotics: Secondary | ICD-10-CM | POA: Insufficient documentation

## 2015-04-03 DIAGNOSIS — E7800 Pure hypercholesterolemia, unspecified: Secondary | ICD-10-CM

## 2015-04-03 DIAGNOSIS — E1110 Type 2 diabetes mellitus with ketoacidosis without coma: Secondary | ICD-10-CM | POA: Diagnosis present

## 2015-04-03 DIAGNOSIS — L708 Other acne: Secondary | ICD-10-CM

## 2015-04-03 DIAGNOSIS — R300 Dysuria: Secondary | ICD-10-CM

## 2015-04-03 DIAGNOSIS — L259 Unspecified contact dermatitis, unspecified cause: Secondary | ICD-10-CM

## 2015-04-03 DIAGNOSIS — B373 Candidiasis of vulva and vagina: Secondary | ICD-10-CM

## 2015-04-03 DIAGNOSIS — E1010 Type 1 diabetes mellitus with ketoacidosis without coma: Principal | ICD-10-CM | POA: Insufficient documentation

## 2015-04-03 DIAGNOSIS — Z794 Long term (current) use of insulin: Secondary | ICD-10-CM | POA: Diagnosis not present

## 2015-04-03 DIAGNOSIS — R1110 Vomiting, unspecified: Secondary | ICD-10-CM | POA: Diagnosis present

## 2015-04-03 DIAGNOSIS — Z79899 Other long term (current) drug therapy: Secondary | ICD-10-CM | POA: Insufficient documentation

## 2015-04-03 DIAGNOSIS — B3731 Acute candidiasis of vulva and vagina: Secondary | ICD-10-CM

## 2015-04-03 DIAGNOSIS — R809 Proteinuria, unspecified: Secondary | ICD-10-CM

## 2015-04-03 DIAGNOSIS — I10 Essential (primary) hypertension: Secondary | ICD-10-CM | POA: Diagnosis not present

## 2015-04-03 DIAGNOSIS — R000 Tachycardia, unspecified: Secondary | ICD-10-CM | POA: Insufficient documentation

## 2015-04-03 DIAGNOSIS — Z9119 Patient's noncompliance with other medical treatment and regimen: Secondary | ICD-10-CM

## 2015-04-03 DIAGNOSIS — N179 Acute kidney failure, unspecified: Secondary | ICD-10-CM

## 2015-04-03 DIAGNOSIS — R112 Nausea with vomiting, unspecified: Secondary | ICD-10-CM | POA: Diagnosis not present

## 2015-04-03 HISTORY — DX: Essential (primary) hypertension: I10

## 2015-04-03 LAB — URINE MICROSCOPIC-ADD ON

## 2015-04-03 LAB — CBC
HCT: 35.4 % — ABNORMAL LOW (ref 36.0–46.0)
Hemoglobin: 12.1 g/dL (ref 12.0–15.0)
MCH: 30.8 pg (ref 26.0–34.0)
MCHC: 34.2 g/dL (ref 30.0–36.0)
MCV: 90.1 fL (ref 78.0–100.0)
PLATELETS: 426 10*3/uL — AB (ref 150–400)
RBC: 3.93 MIL/uL (ref 3.87–5.11)
RDW: 12.6 % (ref 11.5–15.5)
WBC: 9.8 10*3/uL (ref 4.0–10.5)

## 2015-04-03 LAB — BASIC METABOLIC PANEL
Anion gap: 12 (ref 5–15)
Anion gap: 8 (ref 5–15)
Anion gap: 9 (ref 5–15)
BUN: 30 mg/dL — AB (ref 6–20)
BUN: 29 mg/dL — ABNORMAL HIGH (ref 6–20)
BUN: 27 mg/dL — ABNORMAL HIGH (ref 6–20)
CALCIUM: 8.8 mg/dL — AB (ref 8.9–10.3)
CHLORIDE: 105 mmol/L (ref 101–111)
CHLORIDE: 103 mmol/L (ref 101–111)
CO2: 22 mmol/L (ref 22–32)
CO2: 23 mmol/L (ref 22–32)
CO2: 25 mmol/L (ref 22–32)
CREATININE: 1.71 mg/dL — AB (ref 0.44–1.00)
CREATININE: 1.56 mg/dL — AB (ref 0.44–1.00)
CREATININE: 1.72 mg/dL — AB (ref 0.44–1.00)
Calcium: 8.3 mg/dL — ABNORMAL LOW (ref 8.9–10.3)
Calcium: 8.3 mg/dL — ABNORMAL LOW (ref 8.9–10.3)
Chloride: 103 mmol/L (ref 101–111)
GFR calc Af Amer: 45 mL/min — ABNORMAL LOW (ref >60)
GFR calc Af Amer: 50 mL/min — ABNORMAL LOW (ref >60)
GFR calc non Af Amer: 43 mL/min — ABNORMAL LOW (ref >60)
GFR calc non Af Amer: 38 mL/min — ABNORMAL LOW (ref >60)
GFR, EST AFRICAN AMERICAN: 44 mL/min — AB (ref >60)
GFR, EST NON AFRICAN AMERICAN: 39 mL/min — AB (ref >60)
Glucose, Bld: 294 mg/dL — ABNORMAL HIGH (ref 65–99)
Glucose, Bld: 128 mg/dL — ABNORMAL HIGH (ref 65–99)
Glucose, Bld: 182 mg/dL — ABNORMAL HIGH (ref 65–99)
POTASSIUM: 3.5 mmol/L (ref 3.5–5.1)
Potassium: 4 mmol/L (ref 3.5–5.1)
Potassium: 3.8 mmol/L (ref 3.5–5.1)
SODIUM: 137 mmol/L (ref 135–145)
SODIUM: 136 mmol/L (ref 135–145)
SODIUM: 137 mmol/L (ref 135–145)

## 2015-04-03 LAB — GLUCOSE, CAPILLARY
GLUCOSE-CAPILLARY: 337 mg/dL — AB (ref 65–99)
GLUCOSE-CAPILLARY: 293 mg/dL — AB (ref 65–99)
GLUCOSE-CAPILLARY: 98 mg/dL (ref 65–99)
GLUCOSE-CAPILLARY: 98 mg/dL (ref 65–99)
GLUCOSE-CAPILLARY: 122 mg/dL — AB (ref 65–99)
GLUCOSE-CAPILLARY: 150 mg/dL — AB (ref 65–99)
Glucose-Capillary: 173 mg/dL — ABNORMAL HIGH (ref 65–99)
Glucose-Capillary: 117 mg/dL — ABNORMAL HIGH (ref 65–99)
Glucose-Capillary: 100 mg/dL — ABNORMAL HIGH (ref 65–99)

## 2015-04-03 LAB — COMPREHENSIVE METABOLIC PANEL
ALT: 14 U/L (ref 14–54)
AST: 14 U/L — AB (ref 15–41)
Albumin: 3.5 g/dL (ref 3.5–5.0)
Alkaline Phosphatase: 96 U/L (ref 38–126)
Anion gap: 18 — ABNORMAL HIGH (ref 5–15)
BILIRUBIN TOTAL: 0.6 mg/dL (ref 0.3–1.2)
BUN: 32 mg/dL — AB (ref 6–20)
CHLORIDE: 93 mmol/L — AB (ref 101–111)
CO2: 20 mmol/L — ABNORMAL LOW (ref 22–32)
CREATININE: 1.89 mg/dL — AB (ref 0.44–1.00)
Calcium: 9.4 mg/dL (ref 8.9–10.3)
GFR calc Af Amer: 40 mL/min — ABNORMAL LOW (ref >60)
GFR calc non Af Amer: 34 mL/min — ABNORMAL LOW (ref >60)
Glucose, Bld: 419 mg/dL — ABNORMAL HIGH (ref 65–99)
Potassium: 3.8 mmol/L (ref 3.5–5.1)
Sodium: 131 mmol/L — ABNORMAL LOW (ref 135–145)
Total Protein: 7.9 g/dL (ref 6.5–8.1)

## 2015-04-03 LAB — URINALYSIS, ROUTINE W REFLEX MICROSCOPIC
BILIRUBIN URINE: NEGATIVE
LEUKOCYTES UA: NEGATIVE
Nitrite: NEGATIVE
Protein, ur: 100 mg/dL — AB
Specific Gravity, Urine: 1.025 (ref 1.005–1.030)
pH: 5.5 (ref 5.0–8.0)

## 2015-04-03 LAB — CBG MONITORING, ED
GLUCOSE-CAPILLARY: 386 mg/dL — AB (ref 65–99)
Glucose-Capillary: 356 mg/dL — ABNORMAL HIGH (ref 65–99)

## 2015-04-03 LAB — I-STAT BETA HCG BLOOD, ED (MC, WL, AP ONLY)

## 2015-04-03 MED ORDER — GABAPENTIN 300 MG PO CAPS
300.0000 mg | ORAL_CAPSULE | Freq: Four times a day (QID) | ORAL | Status: DC
  Administered 2015-04-03 – 2015-04-04 (×4): 300 mg via ORAL
  Filled 2015-04-03 (×4): qty 1

## 2015-04-03 MED ORDER — HEPARIN SODIUM (PORCINE) 5000 UNIT/ML IJ SOLN
5000.0000 [IU] | Freq: Three times a day (TID) | INTRAMUSCULAR | Status: DC
  Filled 2015-04-03 (×5): qty 1

## 2015-04-03 MED ORDER — AMOXICILLIN 500 MG PO CAPS
500.0000 mg | ORAL_CAPSULE | Freq: Three times a day (TID) | ORAL | Status: DC
  Administered 2015-04-03 – 2015-04-04 (×4): 500 mg via ORAL
  Filled 2015-04-03: qty 1
  Filled 2015-04-03 (×2): qty 2
  Filled 2015-04-03 (×5): qty 1
  Filled 2015-04-03: qty 2

## 2015-04-03 MED ORDER — SODIUM CHLORIDE 0.9 % IV SOLN
INTRAVENOUS | Status: DC
  Administered 2015-04-03: 3.3 [IU]/h via INTRAVENOUS
  Filled 2015-04-03: qty 2.5

## 2015-04-03 MED ORDER — DEXTROSE-NACL 5-0.45 % IV SOLN
INTRAVENOUS | Status: DC
  Administered 2015-04-03: 1000 mL via INTRAVENOUS

## 2015-04-03 MED ORDER — CYCLOBENZAPRINE HCL 10 MG PO TABS: 10.0000 mg | ORAL_TABLET | Freq: Two times a day (BID) | ORAL | Status: DC | PRN

## 2015-04-03 MED ORDER — INSULIN ASPART PROT & ASPART (70-30 MIX) 100 UNIT/ML ~~LOC~~ SUSP
45.0000 [IU] | Freq: Two times a day (BID) | SUBCUTANEOUS | Status: DC
  Administered 2015-04-03 – 2015-04-04 (×2): 45 [IU] via SUBCUTANEOUS
  Filled 2015-04-03: qty 10

## 2015-04-03 MED ORDER — ONDANSETRON HCL 4 MG/2ML IJ SOLN
4.0000 mg | Freq: Four times a day (QID) | INTRAMUSCULAR | Status: DC | PRN
  Administered 2015-04-03 (×2): 4 mg via INTRAVENOUS
  Filled 2015-04-03 (×2): qty 2

## 2015-04-03 MED ORDER — SODIUM CHLORIDE 0.9 % IV SOLN
INTRAVENOUS | Status: DC
  Administered 2015-04-03 – 2015-04-04 (×2): via INTRAVENOUS

## 2015-04-03 MED ORDER — DEXTROSE-NACL 5-0.45 % IV SOLN: INTRAVENOUS | Status: DC

## 2015-04-03 MED ORDER — POTASSIUM CHLORIDE 10 MEQ/100ML IV SOLN
10.0000 meq | INTRAVENOUS | Status: AC
  Administered 2015-04-03 (×2): 10 meq via INTRAVENOUS
  Filled 2015-04-03: qty 100

## 2015-04-03 MED ORDER — HYDROCODONE-ACETAMINOPHEN 5-325 MG PO TABS: 1.0000 | ORAL_TABLET | Freq: Four times a day (QID) | ORAL | Status: DC | PRN

## 2015-04-03 MED ORDER — CARBAMAZEPINE 200 MG PO TABS
200.0000 mg | ORAL_TABLET | Freq: Every day | ORAL | Status: DC
Start: 2015-04-03 — End: 2015-04-04
  Administered 2015-04-03 – 2015-04-04 (×2): 200 mg via ORAL
  Filled 2015-04-03 (×2): qty 1

## 2015-04-03 MED ORDER — LACTATED RINGERS IV SOLN: INTRAVENOUS | Status: DC

## 2015-04-03 MED ORDER — AMITRIPTYLINE HCL 25 MG PO TABS
50.0000 mg | ORAL_TABLET | Freq: Every day | ORAL | Status: DC
  Administered 2015-04-03: 50 mg via ORAL
  Filled 2015-04-03: qty 2

## 2015-04-03 MED ORDER — AMOXICILLIN 250 MG PO CAPS
500.0000 mg | ORAL_CAPSULE | Freq: Once | ORAL | Status: DC
  Filled 2015-04-03: qty 2

## 2015-04-03 MED ORDER — ONDANSETRON HCL 4 MG/2ML IJ SOLN
4.0000 mg | Freq: Once | INTRAMUSCULAR | Status: AC | PRN
  Administered 2015-04-03: 4 mg via INTRAVENOUS
  Filled 2015-04-03: qty 2

## 2015-04-03 MED ORDER — INSULIN ASPART 100 UNIT/ML ~~LOC~~ SOLN: 0.0000 [IU] | Freq: Every day | SUBCUTANEOUS | Status: DC

## 2015-04-03 MED ORDER — INSULIN ASPART 100 UNIT/ML ~~LOC~~ SOLN
0.0000 [IU] | Freq: Three times a day (TID) | SUBCUTANEOUS | Status: DC
  Administered 2015-04-03: 2 [IU] via SUBCUTANEOUS
  Administered 2015-04-04: 5 [IU] via SUBCUTANEOUS

## 2015-04-03 MED ORDER — SODIUM CHLORIDE 0.9 % IV BOLUS (SEPSIS)
1000.0000 mL | Freq: Once | INTRAVENOUS | Status: AC
  Administered 2015-04-03: 1000 mL via INTRAVENOUS

## 2015-04-03 NOTE — Progress Notes (Signed)
Paged NP Kathline Magic about 0400 BMET results. No new orders.

## 2015-04-03 NOTE — ED Notes (Signed)
Patient states that on Monday she was supposed to have dental work and had a panic attack in the dental chair because she has trigenminal neualgia. She also has had HTN and N/V since Monday. Reports that she has not taken any of her medications since Monday due to the throwing up and not being able to keep anything down. The patient reports that she is not taking her insulin either.

## 2015-04-03 NOTE — Progress Notes (Signed)
Patient admitted after midnight- please see H&P.  Appears a GI virus pushed patient into DKA-- transitioned back to SQ 70/30-- plan to d/c in AM  Eulogio Bear DO

## 2015-04-03 NOTE — H&P (Addendum)
[facility] History and Physical  CHARLIN DEANTONIO [id number] DOB: Nov 01, 1980 DOA: 04/03/2015  Referring physician: EDP PCP: Robyn Haber, MD   Chief Complaint: Emesis   HPI: Carrie [name] is a 34 y.o. female with h/o DM1, DKA in past, patient has had 5 day history of N/V that has been ongoing.  Worse in the past 24 hours.  She states because she wasn't eating she hasnt taken any insulin.  Symptoms onset Monday after having a tooth extracted at the dentist.  Sister is sick with diarrhea.  Review of Systems: Systems reviewed.  As above, otherwise negative  Past Medical History  Diagnosis Date  . Diabetes mellitus without complication ([place])   . Diabetic neuropathy ([place])   . HA (headache)   . Hypertension    Past Surgical History  Procedure Laterality Date  . None     Social History:  reports that she has never smoked. She has never used smokeless tobacco. She reports that she drinks about 2.0 oz of alcohol per week. She reports that she does not use illicit drugs.  No Known Allergies  Family History  Problem Relation Age of Onset  . Hyperlipidemia Mother   . Asthma Daughter   . Diabetes Maternal Grandmother   . Kidney disease Maternal Grandfather   . Kidney disease Paternal Grandmother      Prior to Admission medications   Medication Sig Start Date End Date Taking? Authorizing Provider  amitriptyline (ELAVIL) 50 MG tablet Take 1 tablet (50 mg total) by mouth at bedtime. PATIENT NEEDS OFFICE VISIT FOR ADDITIONAL REFILLS 04/15/13  Yes Robyn Haber, MD  amoxicillin (AMOXIL) 500 MG tablet Take 500 mg by mouth 3 (three) times daily. 03/29/15  Yes Historical Provider, MD  carbamazepine (TEGRETOL) 200 MG tablet Take 200 mg by mouth daily.   Yes Historical Provider, MD  cyclobenzaprine (FLEXERIL) 10 MG tablet Take 10 mg by mouth 2 (two) times daily as needed for muscle spasms.   Yes Historical Provider, MD  gabapentin (NEURONTIN) 300 MG capsule Take 300 mg by mouth 4  (four) times daily.   Yes Historical Provider, MD  hydrochlorothiazide (HYDRODIURIL) 25 MG tablet Take 25 mg by mouth daily.   Yes Historical Provider, MD  HYDROcodone-acetaminophen (NORCO/VICODIN) 5-325 MG tablet Take 1 tablet by mouth every 6 (six) hours as needed for moderate pain.   Yes Historical Provider, MD  insulin NPH-regular (NOVOLIN 70/30) (70-30) 100 UNIT/ML injection Inject 45 Units into the skin 2 (two) times daily with a meal.    Yes Historical Provider, MD  lisinopril (PRINIVIL,ZESTRIL) 10 MG tablet Take 10 mg by mouth daily.   Yes Historical Provider, MD  Pediatric Multivit-Minerals-C (RA GUMMY VITAMINS & MINERALS PO) Take 2 tablets by mouth daily.   Yes Historical Provider, MD   Physical Exam: Filed Vitals:   04/03/15 0022  BP: 159/109  Pulse: 118  Temp: 98 F (36.7 C)  Resp: 18    BP 159/109 mmHg  Pulse 118  Temp(Src) 98 F (36.7 C) (Oral)  Resp 18  Ht 5\' 8"  (1.727 m)  Wt 88.451 kg (195 lb)  BMI 29.66 kg/m2  SpO2 100%  LMP 03/27/2015  General Appearance:    Alert, oriented, no distress, appears stated age  Head:    Normocephalic, atraumatic  Eyes:    PERRL, EOMI, sclera non-icteric        Nose:   Nares without drainage or epistaxis. Mucosa, turbinates normal  Throat:   Moist mucous membranes. Oropharynx without erythema or exudate.  Neck:   Supple. No carotid bruits.  No thyromegaly.  No lymphadenopathy.   Back:     No CVA tenderness, no spinal tenderness  Lungs:     Clear to auscultation bilaterally, without wheezes, rhonchi or rales  Chest wall:    No tenderness to palpitation  Heart:    Regular rate and rhythm without murmurs, gallops, rubs  Abdomen:     Soft, non-tender, nondistended, normal bowel sounds, no organomegaly  Genitalia:    deferred  Rectal:    deferred  Extremities:   No clubbing, cyanosis or edema.  Pulses:   2+ and symmetric all extremities  Skin:   Skin color, texture, turgor normal, no rashes or lesions  Lymph nodes:   Cervical,  supraclavicular, and axillary nodes normal  Neurologic:   CNII-XII intact. Normal strength, sensation and reflexes      throughout    Labs on Admission:  Basic Metabolic Panel:  Recent Labs Lab 04/03/15 0049  NA 131*  K 3.8  CL 93*  CO2 20*  GLUCOSE 419*  BUN 32*  CREATININE 1.89*  CALCIUM 9.4   Liver Function Tests:  Recent Labs Lab 04/03/15 0049  AST 14*  ALT 14  ALKPHOS 96  BILITOT 0.6  PROT 7.9  ALBUMIN 3.5   No results for input(s): LIPASE, AMYLASE in the last 168 hours. No results for input(s): AMMONIA in the last 168 hours. CBC:  Recent Labs Lab 04/03/15 0049  WBC 9.8  HGB 12.1  HCT 35.4*  MCV 90.1  PLT 426*   Cardiac Enzymes: No results for input(s): CKTOTAL, CKMB, CKMBINDEX, TROPONINI in the last 168 hours.  BNP (last 3 results) No results for input(s): PROBNP in the last 8760 hours. CBG:  Recent Labs Lab 04/03/15 0028 04/03/15 0212  GLUCAP 356* 386*    Radiological Exams on Admission: No results found.  EKG: Independently reviewed.  Assessment/Plan Active Problems:   DKA (diabetic ketoacidoses) ([place])   1. DKA - 1. DKA pathway 2. Insulin gtt 3. IVF 4. q4h BMPs 2. S/p Tooth extraction - continue PO ABx 3. HTN - holding lisinopril / HCTZ due to creatinine elevation of 1.8.    Code Status: Full  Family Communication: Family at bedside Disposition Plan: Admit to obs   Time spent: 50 min  GARDNER, JARED M. [facility] Pager [phone]  If 7AM-7PM, please contact the day team taking care of the patient [email or web address] Password [email or web address] 04/03/2015, 2:31 AM

## 2015-04-03 NOTE — ED Provider Notes (Signed)
CSN: [id number]     Arrival date & time 04/03/15  0016 History   By signing my name below, I, Randa Evens, attest that this documentation has been prepared under the direction and in the presence of [employer], DO. Electronically Signed: Randa Evens, ED Scribe. 04/03/2015. 10:45 PM.      Chief Complaint  Patient presents with  . Emesis    Patient is a 34 y.o. female presenting with vomiting. The history is provided by the patient. No language interpreter was used.  Emesis Associated symptoms: no abdominal pain and no diarrhea    HPI Comments: Carrie [name] is a 34 y.o. female who presents to the Emergency Department complaining of worsening nausea and vomiting onset 5 days prior. Pt states that her symptoms have been the worse within the past 24 hours. Pt states that she is not able to solids or liquids. Pt sates that she is vomiting greenish bile. Pt states that she has not been complaint with her insulin due to not eating. Pt states that he symptoms began after going to the dentist on Monday to have a tooth extracted, she states that she has a Hx of trigeminal neuralgia and had a panic attack while sitting in the chair. Pt states that she has not been able to take her medications due to vomiting them up. Denies abdominal pain, CP, SOB, cough or diarrhea.    Past Medical History  Diagnosis Date  . Diabetes mellitus without complication ([place])   . Diabetic neuropathy ([place])   . HA (headache)   . Hypertension    Past Surgical History  Procedure Laterality Date  . None     Family History  Problem Relation Age of Onset  . Hyperlipidemia Mother   . Asthma Daughter   . Diabetes Maternal Grandmother   . Kidney disease Maternal Grandfather   . Kidney disease Paternal Grandmother    Social History  Substance Use Topics  . Smoking status: Never Smoker   . Smokeless tobacco: Never Used  . Alcohol Use: 2.0 oz/week    4 Standard drinks or equivalent per week     Comment: 2-3  a week   OB History    No data available      Review of Systems  Respiratory: Negative for cough and shortness of breath.   Cardiovascular: Negative for chest pain.  Gastrointestinal: Positive for nausea and vomiting. Negative for abdominal pain and diarrhea.  All other systems reviewed and are negative.  ROS 10 Systems reviewed and are negative for acute change except as noted in the HPI.      Allergies  Review of patient's allergies indicates no known allergies.  Home Medications   Prior to Admission medications   Medication Sig Start Date End Date Taking? Authorizing Provider  amitriptyline (ELAVIL) 50 MG tablet Take 1 tablet (50 mg total) by mouth at bedtime. PATIENT NEEDS OFFICE VISIT FOR ADDITIONAL REFILLS 04/15/13  Yes Robyn Haber, MD  amoxicillin (AMOXIL) 500 MG tablet Take 500 mg by mouth 3 (three) times daily. 03/29/15  Yes Historical Provider, MD  carbamazepine (TEGRETOL) 200 MG tablet Take 200 mg by mouth daily.   Yes Historical Provider, MD  cyclobenzaprine (FLEXERIL) 10 MG tablet Take 10 mg by mouth 2 (two) times daily as needed for muscle spasms.   Yes Historical Provider, MD  gabapentin (NEURONTIN) 300 MG capsule Take 300 mg by mouth 4 (four) times daily.   Yes Historical Provider, MD  hydrochlorothiazide (HYDRODIURIL) 25 MG tablet  Take 25 mg by mouth daily.   Yes Historical Provider, MD  HYDROcodone-acetaminophen (NORCO/VICODIN) 5-325 MG tablet Take 1 tablet by mouth every 6 (six) hours as needed for moderate pain.   Yes Historical Provider, MD  insulin NPH-regular (NOVOLIN 70/30) (70-30) 100 UNIT/ML injection Inject 45 Units into the skin 2 (two) times daily with a meal.    Yes Historical Provider, MD  lisinopril (PRINIVIL,ZESTRIL) 10 MG tablet Take 10 mg by mouth daily.   Yes Historical Provider, MD  Pediatric Multivit-Minerals-C (RA GUMMY VITAMINS & MINERALS PO) Take 2 tablets by mouth daily.   Yes Historical Provider, MD   BP 143/88 mmHg  Pulse 106   Temp(Src) 98.2 F (36.8 C) (Oral)  Resp 15  Ht 5\' 8"  (1.727 m)  Wt 195 lb (88.451 kg)  BMI 29.66 kg/m2  SpO2 100%  LMP 03/27/2015   Physical Exam  Constitutional: She is oriented to person, place, and time. She appears well-developed and well-nourished. No distress.  HENT:  Head: Normocephalic and atraumatic.  Mouth/Throat: Mucous membranes are dry.  Left upper quadrant molar tooth has evidence of decay no active bleeding or drainage. Gingival exam reveals no fluctuance.   Eyes: Conjunctivae and EOM are normal.  Neck: Neck supple. No tracheal deviation present.  Cardiovascular: Regular rhythm.  Tachycardia present.   Pulmonary/Chest: Effort normal. No respiratory distress.  Lung clear to auscultation.   Abdominal: There is no tenderness.  Musculoskeletal: Normal range of motion.  Neurological: She is alert and oriented to person, place, and time.  Skin: Skin is warm and dry.  Psychiatric: She has a normal mood and affect. Her behavior is normal.  Nursing note and vitals reviewed.   ED Course  Procedures (including critical care time) DIAGNOSTIC STUDIES: Oxygen Saturation is 100% on RA, normal by my interpretation.    COORDINATION OF CARE: 12:57 AM-Discussed treatment plan with pt at bedside and pt agreed to plan.    Labs Review Labs Reviewed  COMPREHENSIVE METABOLIC PANEL - Abnormal; Notable for the following:    Sodium 131 (*)    Chloride 93 (*)    CO2 20 (*)    Glucose, Bld 419 (*)    BUN 32 (*)    Creatinine, Ser 1.89 (*)    AST 14 (*)    GFR calc non Af Carrie 34 (*)    GFR calc Af Carrie 40 (*)    Anion gap 18 (*)    All other components within normal limits  CBC - Abnormal; Notable for the following:    HCT 35.4 (*)    Platelets 426 (*)    All other components within normal limits  URINALYSIS, ROUTINE W REFLEX MICROSCOPIC (NOT AT [facility]) - Abnormal; Notable for the following:    APPearance CLOUDY (*)    Glucose, UA >1000 (*)    Hgb urine dipstick LARGE (*)     Ketones, ur >80 (*)    Protein, ur 100 (*)    All other components within normal limits  BASIC METABOLIC PANEL - Abnormal; Notable for the following:    Glucose, Bld 294 (*)    BUN 30 (*)    Creatinine, Ser 1.71 (*)    Calcium 8.8 (*)    GFR calc non Af Carrie 39 (*)    GFR calc Af Carrie 45 (*)    All other components within normal limits  BASIC METABOLIC PANEL - Abnormal; Notable for the following:    Glucose, Bld 128 (*)    BUN 29 (*)  Creatinine, Ser 1.56 (*)    Calcium 8.3 (*)    GFR calc non Af Carrie 43 (*)    GFR calc Af Carrie 50 (*)    All other components within normal limits  BASIC METABOLIC PANEL - Abnormal; Notable for the following:    Glucose, Bld 182 (*)    BUN 27 (*)    Creatinine, Ser 1.72 (*)    Calcium 8.3 (*)    GFR calc non Af Carrie 38 (*)    GFR calc Af Carrie 44 (*)    All other components within normal limits  URINE MICROSCOPIC-ADD ON - Abnormal; Notable for the following:    Squamous Epithelial / LPF 0-5 (*)    Bacteria, UA RARE (*)    All other components within normal limits  GLUCOSE, CAPILLARY - Abnormal; Notable for the following:    Glucose-Capillary 337 (*)    All other components within normal limits  GLUCOSE, CAPILLARY - Abnormal; Notable for the following:    Glucose-Capillary 293 (*)    All other components within normal limits  GLUCOSE, CAPILLARY - Abnormal; Notable for the following:    Glucose-Capillary 173 (*)    All other components within normal limits  GLUCOSE, CAPILLARY - Abnormal; Notable for the following:    Glucose-Capillary 117 (*)    All other components within normal limits  GLUCOSE, CAPILLARY - Abnormal; Notable for the following:    Glucose-Capillary 100 (*)    All other components within normal limits  GLUCOSE, CAPILLARY - Abnormal; Notable for the following:    Glucose-Capillary 122 (*)    All other components within normal limits  GLUCOSE, CAPILLARY - Abnormal; Notable for the following:    Glucose-Capillary 150 (*)     All other components within normal limits  CBG MONITORING, ED - Abnormal; Notable for the following:    Glucose-Capillary 356 (*)    All other components within normal limits  CBG MONITORING, ED - Abnormal; Notable for the following:    Glucose-Capillary 386 (*)    All other components within normal limits  GLUCOSE, CAPILLARY  GLUCOSE, CAPILLARY  BASIC METABOLIC PANEL  I-STAT BETA HCG BLOOD, ED ([facility], [facility], [facility] ONLY)    Imaging Review No results found.    EKG Interpretation None      MDM   Final diagnoses:  Diabetic ketoacidosis without coma associated with type 1 diabetes mellitus ([place])  AKI (acute kidney injury) ([place])     I personally performed the services described in this documentation, which was scribed in my presence. The recorded information has been reviewed and is accurate.  Pt comes in with cc of n/v and elevated blood sugar. She reports hx of dental infection for which she is taking antibiotics. She started having n/v since Monday, and has been unable to keep any meds down. She also notes elevated BP. Pt denies fevers, chills, chest pains, shortness of breath, headaches, abdominal pain, uti like symptoms, jaw pain/trismus. She appears dry on exam and i suspect that she might be in DKA. Will get the basic labs and treat based on the results. No severe source of infection discerned. Her dental infection is mild.   CRITICAL CARE Performed by: Varney Biles   Total critical care time: 48 minutes  Critical care time was exclusive of separately billable procedures and treating other patients.  Critical care was necessary to treat or prevent imminent or life-threatening deterioration.  Critical care was time spent personally by me on the following activities: development  of treatment plan with patient and/or surrogate as well as nursing, discussions with consultants, evaluation of patient's response to treatment, examination of patient, obtaining history from  patient or surrogate, ordering and performing treatments and interventions, ordering and review of laboratory studies, ordering and review of radiographic studies, pulse oximetry and re-evaluation of patient's condition.       Varney Biles, MD 04/03/15 2249

## 2015-04-03 NOTE — Plan of Care (Signed)
Problem: Metabolic: Goal: Complications related to the disease process, condition or treatment will be avoided or minimized Outcome: Progressing Denies nausea this am

## 2015-04-03 NOTE — Progress Notes (Signed)
Patient admitted to room 1322 after discussing with ED for several minutes about cardiac monitoring order and whether patient was in DKA or not. Orders clarified and cardiac monitoring order discontinued. Patient admitted with insulin gtt infusing. glucostabilizer resumed. Patient oriented to room and unit.

## 2015-04-04 DIAGNOSIS — R112 Nausea with vomiting, unspecified: Secondary | ICD-10-CM | POA: Diagnosis not present

## 2015-04-04 DIAGNOSIS — E1010 Type 1 diabetes mellitus with ketoacidosis without coma: Secondary | ICD-10-CM

## 2015-04-04 DIAGNOSIS — N179 Acute kidney failure, unspecified: Secondary | ICD-10-CM | POA: Diagnosis not present

## 2015-04-04 LAB — BASIC METABOLIC PANEL
Anion gap: 5 (ref 5–15)
BUN: 27 mg/dL — AB (ref 6–20)
CALCIUM: 7.8 mg/dL — AB (ref 8.9–10.3)
CO2: 23 mmol/L (ref 22–32)
CREATININE: 1.62 mg/dL — AB (ref 0.44–1.00)
Chloride: 107 mmol/L (ref 101–111)
GFR calc non Af Amer: 41 mL/min — ABNORMAL LOW (ref >60)
GFR, EST AFRICAN AMERICAN: 48 mL/min — AB (ref >60)
Glucose, Bld: 265 mg/dL — ABNORMAL HIGH (ref 65–99)
Potassium: 3.9 mmol/L (ref 3.5–5.1)
Sodium: 135 mmol/L (ref 135–145)

## 2015-04-04 LAB — GLUCOSE, CAPILLARY
Glucose-Capillary: 248 mg/dL — ABNORMAL HIGH (ref 65–99)
Glucose-Capillary: 123 mg/dL — ABNORMAL HIGH (ref 65–99)

## 2015-04-04 MED ORDER — INSULIN ASPART PROT & ASPART (70-30 MIX) 100 UNIT/ML ~~LOC~~ SUSP
55.0000 [IU] | Freq: Two times a day (BID) | SUBCUTANEOUS | Status: DC
  Filled 2015-04-04: qty 10

## 2015-04-04 MED ORDER — AMITRIPTYLINE HCL 50 MG PO TABS: 50.0000 mg | ORAL_TABLET | Freq: Every day | ORAL | Status: DC

## 2015-04-04 MED ORDER — INSULIN ASPART PROT & ASPART (70-30 MIX) 100 UNIT/ML ~~LOC~~ SUSP: 55.0000 [IU] | Freq: Two times a day (BID) | SUBCUTANEOUS | Status: DC

## 2015-04-04 MED ORDER — ONDANSETRON HCL 4 MG PO TABS: 4.0000 mg | ORAL_TABLET | Freq: Three times a day (TID) | ORAL | Status: DC | PRN

## 2015-04-04 NOTE — Progress Notes (Signed)
Patient discharged to home with family, discharge instructions reviewed with patient who verbalized understanding. New RX's given to patient. 

## 2015-04-04 NOTE — Plan of Care (Signed)
Problem: Tissue Perfusion: Goal: Risk factors for ineffective tissue perfusion will decrease Outcome: Progressing Refuses subQ heparin, but is ambulatory

## 2015-04-04 NOTE — Plan of Care (Signed)
Problem: Nutritional: Goal: Maintenance of adequate nutrition will improve Outcome: Progressing Tolerating carb mod diet, using prn zofran for nausea

## 2015-04-04 NOTE — Progress Notes (Signed)
Nutrition Brief Note  Patient identified on the Malnutrition Screening Tool (MST) Report  Pt with weight gain over the last year.   Wt Readings from Last 15 Encounters:  04/03/15 195 lb (88.451 kg)  04/12/14 174 lb (78.926 kg)  12/12/13 159 lb (72.122 kg)  04/16/13 150 lb (68.04 kg)  10/30/12 152 lb 6.4 oz (69.128 kg)  10/26/12 156 lb 3.2 oz (70.852 kg)  10/23/12 137 lb (62.143 kg)  10/23/12 137 lb 9.6 oz (62.415 kg)  07/18/12 146 lb 3.2 oz (66.316 kg)  06/29/12 148 lb (67.132 kg)  06/26/12 149 lb 3.2 oz (67.677 kg)  08/24/09 156 lb (70.761 kg)  05/25/09 149 lb 11.2 oz (67.903 kg)  05/11/09 149 lb (67.586 kg)  01/28/09 149 lb 4.8 oz (67.722 kg)    Body mass index is 29.66 kg/(m^2). Patient meets criteria for overweight based on current BMI.   Current diet order is CHO modified, patient is consuming approximately 100% of meals at this time. Labs and medications reviewed.   No nutrition interventions warranted at this time. If nutrition issues arise, please consult RD.   Clayton Bibles, MS, RD, LDN Pager: [phone] After Hours Pager: [phone]

## 2015-04-04 NOTE — Discharge Summary (Signed)
Physician Discharge Summary  Carrie [name] [id number] DOB: 06-06-1980 DOA: 04/03/2015  PCP: Robyn Haber, MD  Admit date: 04/03/2015 Discharge date: 04/04/2015  Time spent: 35 minutes  Recommendations for Outpatient Follow-up:  1. Outpatient BMP 1 week 2. Repeat HgbA1C if not done recently 3. Needs tighter blood sugar control   Discharge Diagnoses:  Active Problems:   DKA (diabetic ketoacidoses) ([facility])   Nausea with vomiting   Discharge Condition: improved  Diet recommendation: diabetic  Filed Weights   04/03/15 0022 04/03/15 0205 04/03/15 0320  Weight: 90.719 kg (200 lb) 88.451 kg (195 lb) 88.451 kg (195 lb)    History of present illness:  Carrie [name] is a 34 y.o. female with h/o DM1, DKA in past, patient has had 5 day history of N/V that has been ongoing. Worse in the past 24 hours. She states because she wasn't eating she hasnt taken any insulin. Symptoms onset Monday after having a tooth extracted at the dentist. Sister is sick with diarrhea.  Hospital Course:  DKA- patient eating and drinking -adjusted 70/30 for better control  AKI vs CKD -no labs since 1 year prior- suspect patient has elevated Cr due to DM being uncontrolled  Procedures:    Consultations:    Discharge Exam: Filed Vitals:   04/03/15 2358 04/04/15 0441  BP: 122/70 129/79  Pulse: 100 100  Temp: 98.3 F (36.8 C) 97.7 F (36.5 C)  Resp: 15 17     Discharge Instructions   Discharge Instructions    Diet - low sodium heart healthy    Complete by:  As directed      Diet Carb Modified    Complete by:  As directed      Discharge instructions    Complete by:  As directed   BMP 1 week with PCP     Increase activity slowly    Complete by:  As directed           Current Discharge Medication List    START taking these medications   Details  insulin aspart protamine- aspart (NOVOLOG MIX 70/30) (70-30) 100 UNIT/ML injection Inject 0.55 mLs (55 Units total) into the  skin 2 (two) times daily with a meal. Qty: 10 mL, Refills: 11    ondansetron (ZOFRAN) 4 MG tablet Take 1 tablet (4 mg total) by mouth every 8 (eight) hours as needed for nausea or vomiting. Qty: 20 tablet, Refills: 0      CONTINUE these medications which have CHANGED   Details  amitriptyline (ELAVIL) 50 MG tablet Take 1 tablet (50 mg total) by mouth at bedtime. PATIENT NEEDS OFFICE VISIT FOR ADDITIONAL REFILLS Qty: 15 tablet, Refills: 0      CONTINUE these medications which have NOT CHANGED   Details  amoxicillin (AMOXIL) 500 MG tablet Take 500 mg by mouth 3 (three) times daily.    carbamazepine (TEGRETOL) 200 MG tablet Take 200 mg by mouth daily.    cyclobenzaprine (FLEXERIL) 10 MG tablet Take 10 mg by mouth 2 (two) times daily as needed for muscle spasms.    gabapentin (NEURONTIN) 300 MG capsule Take 300 mg by mouth 4 (four) times daily.    HYDROcodone-acetaminophen (NORCO/VICODIN) 5-325 MG tablet Take 1 tablet by mouth every 6 (six) hours as needed for moderate pain.    Pediatric Multivit-Minerals-C (RA GUMMY VITAMINS & MINERALS PO) Take 2 tablets by mouth daily.      STOP taking these medications     hydrochlorothiazide (HYDRODIURIL) 25 MG tablet  insulin NPH-regular (NOVOLIN 70/30) (70-30) 100 UNIT/ML injection      lisinopril (PRINIVIL,ZESTRIL) 10 MG tablet        No Known Allergies Follow-up Information    Please follow up.   Why:  with PCP on [place] in 1 week for BMP       The results of significant diagnostics from this hospitalization (including imaging, microbiology, ancillary and laboratory) are listed below for reference.    Significant Diagnostic Studies: No results found.  Microbiology: No results found for this or any previous visit (from the past 240 hour(s)).   Labs: Basic Metabolic Panel:  Recent Labs Lab 04/03/15 0049 04/03/15 0406 04/03/15 0649 04/03/15 1042 04/04/15 0500  NA 131* 137 136 137 135  K 3.8 4.0 3.8 3.5 3.9  CL 93*  103 105 103 107  CO2 20* 22 23 25 23   GLUCOSE 419* 294* 128* 182* 265*  BUN 32* 30* 29* 27* 27*  CREATININE 1.89* 1.71* 1.56* 1.72* 1.62*  CALCIUM 9.4 8.8* 8.3* 8.3* 7.8*   Liver Function Tests:  Recent Labs Lab 04/03/15 0049  AST 14*  ALT 14  ALKPHOS 96  BILITOT 0.6  PROT 7.9  ALBUMIN 3.5   No results for input(s): LIPASE, AMYLASE in the last 168 hours. No results for input(s): AMMONIA in the last 168 hours. CBC:  Recent Labs Lab 04/03/15 0049  WBC 9.8  HGB 12.1  HCT 35.4*  MCV 90.1  PLT 426*   Cardiac Enzymes: No results for input(s): CKTOTAL, CKMB, CKMBINDEX, TROPONINI in the last 168 hours. BNP: BNP (last 3 results) No results for input(s): BNP in the last 8760 hours.  ProBNP (last 3 results) No results for input(s): PROBNP in the last 8760 hours.  CBG:  Recent Labs Lab 04/03/15 1155 04/03/15 1743 04/03/15 2125 04/04/15 0736 04/04/15 1219  GLUCAP 98 122* 150* 248* 123*       Signed:  [place]  [facility] 04/04/2015, 2:02 PM

## 2015-04-25 ENCOUNTER — Emergency Department (HOSPITAL_COMMUNITY)
Admission: EM | Admit: 2015-04-25 | Discharge: 2015-04-25 | Disposition: A | Discharge status: Home / Self Care | Payer: 59 | Attending: Emergency Medicine | Admitting: Emergency Medicine

## 2015-04-25 ENCOUNTER — Encounter (HOSPITAL_COMMUNITY): Payer: Self-pay | Admitting: *Deleted

## 2015-04-25 DIAGNOSIS — R1032 Left lower quadrant pain: Secondary | ICD-10-CM | POA: Diagnosis not present

## 2015-04-25 DIAGNOSIS — R112 Nausea with vomiting, unspecified: Secondary | ICD-10-CM

## 2015-04-25 DIAGNOSIS — Z3202 Encounter for pregnancy test, result negative: Secondary | ICD-10-CM | POA: Insufficient documentation

## 2015-04-25 DIAGNOSIS — R197 Diarrhea, unspecified: Secondary | ICD-10-CM | POA: Insufficient documentation

## 2015-04-25 DIAGNOSIS — E1040 Type 1 diabetes mellitus with diabetic neuropathy, unspecified: Secondary | ICD-10-CM | POA: Diagnosis not present

## 2015-04-25 DIAGNOSIS — Z794 Long term (current) use of insulin: Secondary | ICD-10-CM | POA: Diagnosis not present

## 2015-04-25 DIAGNOSIS — R739 Hyperglycemia, unspecified: Secondary | ICD-10-CM

## 2015-04-25 DIAGNOSIS — I10 Essential (primary) hypertension: Secondary | ICD-10-CM | POA: Diagnosis not present

## 2015-04-25 DIAGNOSIS — E1065 Type 1 diabetes mellitus with hyperglycemia: Secondary | ICD-10-CM | POA: Insufficient documentation

## 2015-04-25 DIAGNOSIS — Z79899 Other long term (current) drug therapy: Secondary | ICD-10-CM | POA: Insufficient documentation

## 2015-04-25 LAB — CBC WITH DIFFERENTIAL/PLATELET
BASOS PCT: 1 %
Basophils Absolute: 0.1 10*3/uL (ref 0.0–0.1)
Eosinophils Absolute: 0.1 10*3/uL (ref 0.0–0.7)
Eosinophils Relative: 1 %
HEMATOCRIT: 31.3 % — AB (ref 36.0–46.0)
HEMOGLOBIN: 10.5 g/dL — AB (ref 12.0–15.0)
LYMPHS ABS: 1.9 10*3/uL (ref 0.7–4.0)
Lymphocytes Relative: 26 %
MCH: 29.8 pg (ref 26.0–34.0)
MCHC: 33.5 g/dL (ref 30.0–36.0)
MCV: 88.9 fL (ref 78.0–100.0)
MONO ABS: 0.3 10*3/uL (ref 0.1–1.0)
MONOS PCT: 4 %
NEUTROS ABS: 5 10*3/uL (ref 1.7–7.7)
Neutrophils Relative %: 68 %
Platelets: 282 10*3/uL (ref 150–400)
RBC: 3.52 MIL/uL — ABNORMAL LOW (ref 3.87–5.11)
RDW: 12.4 % (ref 11.5–15.5)
WBC: 7.3 10*3/uL (ref 4.0–10.5)

## 2015-04-25 LAB — URINE MICROSCOPIC-ADD ON
BACTERIA UA: NONE SEEN
Squamous Epithelial / LPF: NONE SEEN

## 2015-04-25 LAB — COMPREHENSIVE METABOLIC PANEL
ALK PHOS: 83 U/L (ref 38–126)
ALT: 13 U/L — AB (ref 14–54)
ANION GAP: 11 (ref 5–15)
AST: 14 U/L — ABNORMAL LOW (ref 15–41)
Albumin: 3.2 g/dL — ABNORMAL LOW (ref 3.5–5.0)
BILIRUBIN TOTAL: 0.6 mg/dL (ref 0.3–1.2)
BUN: 26 mg/dL — ABNORMAL HIGH (ref 6–20)
CALCIUM: 9 mg/dL (ref 8.9–10.3)
CO2: 23 mmol/L (ref 22–32)
CREATININE: 1.48 mg/dL — AB (ref 0.44–1.00)
Chloride: 100 mmol/L — ABNORMAL LOW (ref 101–111)
GFR, EST AFRICAN AMERICAN: 53 mL/min — AB (ref >60)
GFR, EST NON AFRICAN AMERICAN: 46 mL/min — AB (ref >60)
Glucose, Bld: 408 mg/dL — ABNORMAL HIGH (ref 65–99)
Potassium: 3.9 mmol/L (ref 3.5–5.1)
Sodium: 134 mmol/L — ABNORMAL LOW (ref 135–145)
TOTAL PROTEIN: 7 g/dL (ref 6.5–8.1)

## 2015-04-25 LAB — I-STAT BETA HCG BLOOD, ED (MC, WL, AP ONLY)

## 2015-04-25 LAB — CBG MONITORING, ED
Glucose-Capillary: 364 mg/dL — ABNORMAL HIGH (ref 65–99)
Glucose-Capillary: 313 mg/dL — ABNORMAL HIGH (ref 65–99)

## 2015-04-25 LAB — URINALYSIS, ROUTINE W REFLEX MICROSCOPIC
BILIRUBIN URINE: NEGATIVE
Glucose, UA: >1000 mg/dL — AB
KETONES UR: 40 mg/dL — AB
Leukocytes, UA: NEGATIVE
NITRITE: NEGATIVE
Protein, ur: 100 mg/dL — AB
Specific Gravity, Urine: 1.027 (ref 1.005–1.030)
pH: 6.5 (ref 5.0–8.0)

## 2015-04-25 LAB — LIPASE, BLOOD: Lipase: 12 U/L (ref 11–51)

## 2015-04-25 MED ORDER — SODIUM CHLORIDE 0.9 % IV BOLUS (SEPSIS)
1000.0000 mL | Freq: Once | INTRAVENOUS | Status: AC
  Administered 2015-04-25: 1000 mL via INTRAVENOUS

## 2015-04-25 MED ORDER — SODIUM CHLORIDE 0.9 % IV BOLUS (SEPSIS)
500.0000 mL | Freq: Once | INTRAVENOUS | Status: AC
  Administered 2015-04-25: 500 mL via INTRAVENOUS

## 2015-04-25 MED ORDER — ONDANSETRON HCL 4 MG/2ML IJ SOLN
4.0000 mg | Freq: Once | INTRAMUSCULAR | Status: AC
  Administered 2015-04-25: 4 mg via INTRAVENOUS
  Filled 2015-04-25: qty 2

## 2015-04-25 MED ORDER — INSULIN ASPART PROT & ASPART (70-30 MIX) 100 UNIT/ML ~~LOC~~ SUSP
8.0000 [IU] | Freq: Once | SUBCUTANEOUS | Status: AC
  Administered 2015-04-25: 8 [IU] via SUBCUTANEOUS
  Filled 2015-04-25: qty 10

## 2015-04-25 NOTE — ED Notes (Addendum)
Patient states yesterday she had 9 bowel movements and vomited x2 today.  Patient states she has felt weak since yesterday and has not used her insulin.  Patient c/o some abdominal pain, but states it is not severe.  Patient denies urinary symptoms.  Patient also c/o headache.

## 2015-04-25 NOTE — ED Provider Notes (Signed)
CSN: [id number]     Arrival date & time 04/25/15  1338 History   First MD Initiated Contact with Patient 04/25/15 1640     Chief Complaint  Patient presents with  . Diarrhea  . Emesis     (Consider location/radiation/quality/duration/timing/severity/associated sxs/prior Treatment) HPI   Patient is a 35 year old female with past medical history of type 1 diabetes and hypertension who presents to the ED with complaint of nausea, vomiting and diarrhea. Patient reports she was admitted to the hospital on 12/17 for DKA and was discharged on 12/18. She states she was starting to feel better after being discharged but still endorses having mild generalized weakness. Patient states she has been feeling worse over the past 3 days. Endorses nausea, NBNB vomiting and nonbloody diarrhea. Patient also endorses having a headache to her right frontal region which she describes as a constant throbbing pain. She notes the pain is worse with smelling food. Endorses associated generalized weakness. She also reports having left lower quadrant abdominal pain which she describes as intermittent aching. She notes the pain started after having multiple episodes of vomiting and states the pain is worsened with certain movement, vomiting, taking a deep breath or coughing. Denies fever, chills, visual changes, lightheadedness, dizziness, cough, difficulty breathing, chest pain, urinary symptoms, numbness, tingling, weakness. Patient states she typically takes NovoLog 70/30 55 units twice a day but notes she did not take any today.   Past Medical History  Diagnosis Date  . Diabetes mellitus without complication ([place])   . Diabetic neuropathy ([place])   . HA (headache)   . Hypertension    Past Surgical History  Procedure Laterality Date  . None     Family History  Problem Relation Age of Onset  . Hyperlipidemia Mother   . Asthma Daughter   . Diabetes Maternal Grandmother   . Kidney disease Maternal Grandfather   .  Kidney disease Paternal Grandmother    Social History  Substance Use Topics  . Smoking status: Never Smoker   . Smokeless tobacco: Never Used  . Alcohol Use: 2.0 oz/week    4 Standard drinks or equivalent per week     Comment: 2-3 a week   OB History    No data available     Review of Systems  Gastrointestinal: Positive for nausea, vomiting, abdominal pain and diarrhea.  Neurological: Positive for weakness and headaches.      Allergies  Review of patient's allergies indicates no known allergies.  Home Medications   Prior to Admission medications   Medication Sig Start Date End Date Taking? Authorizing Provider  amitriptyline (ELAVIL) 50 MG tablet Take 1 tablet (50 mg total) by mouth at bedtime. PATIENT NEEDS OFFICE VISIT FOR ADDITIONAL REFILLS 04/04/15  Yes Geradine Girt, DO  carbamazepine (TEGRETOL) 200 MG tablet Take 200 mg by mouth daily.   Yes Historical Provider, MD  cyclobenzaprine (FLEXERIL) 5 MG tablet Take 5 mg by mouth 2 (two) times daily as needed for muscle spasms.  04/04/15  Yes Historical Provider, MD  gabapentin (NEURONTIN) 300 MG capsule Take 300 mg by mouth 4 (four) times daily.   Yes Historical Provider, MD  hydrochlorothiazide (HYDRODIURIL) 25 MG tablet Take 37.5 mg by mouth daily.  03/11/15  Yes Historical Provider, MD  HYDROcodone-acetaminophen (NORCO/VICODIN) 5-325 MG tablet Take 1 tablet by mouth every 6 (six) hours as needed for moderate pain.   Yes Historical Provider, MD  insulin aspart protamine- aspart (NOVOLOG MIX 70/30) (70-30) 100 UNIT/ML injection Inject 0.55 mLs (55  Units total) into the skin 2 (two) times daily with a meal. 04/04/15  Yes Jessica U Vann, DO  ondansetron (ZOFRAN) 4 MG tablet Take 1 tablet (4 mg total) by mouth every 8 (eight) hours as needed for nausea or vomiting. 04/04/15  Yes Geradine Girt, DO  Pediatric Multivit-Minerals-C (RA GUMMY VITAMINS & MINERALS PO) Take 2 tablets by mouth daily.   Yes Historical Provider, MD   BP  178/108 mmHg  Pulse 104  Temp(Src) 98.4 F (36.9 C) (Oral)  Resp 18  SpO2 100%  LMP 04/19/2015 Physical Exam  Constitutional: She is oriented to person, place, and time. She appears well-developed and well-nourished. No distress.  HENT:  Head: Normocephalic and atraumatic.  Mouth/Throat: Oropharynx is clear and moist. No oropharyngeal exudate.  Eyes: Conjunctivae and EOM are normal. Pupils are equal, round, and reactive to light. Right eye exhibits no discharge. Left eye exhibits no discharge. No scleral icterus.  Neck: Normal range of motion. Neck supple.  Cardiovascular: Normal rate, regular rhythm, normal heart sounds and intact distal pulses.   Pulmonary/Chest: Effort normal and breath sounds normal. No respiratory distress. She has no wheezes. She has no rales. She exhibits no tenderness.  Abdominal: Soft. Bowel sounds are normal. She exhibits no distension and no mass. There is tenderness (mild tenderness in LLQ). There is no rebound and no guarding.  Musculoskeletal: Normal range of motion. She exhibits no edema.  Lymphadenopathy:    She has no cervical adenopathy.  Neurological: She is alert and oriented to person, place, and time.  Skin: Skin is warm and dry. She is not diaphoretic.  Nursing note and vitals reviewed.   ED Course  Procedures (including critical care time) Labs Review Labs Reviewed  CBC WITH DIFFERENTIAL/PLATELET - Abnormal; Notable for the following:    RBC 3.52 (*)    Hemoglobin 10.5 (*)    HCT 31.3 (*)    All other components within normal limits  COMPREHENSIVE METABOLIC PANEL - Abnormal; Notable for the following:    Sodium 134 (*)    Chloride 100 (*)    Glucose, Bld 408 (*)    BUN 26 (*)    Creatinine, Ser 1.48 (*)    Albumin 3.2 (*)    AST 14 (*)    ALT 13 (*)    GFR calc non Af Amer 46 (*)    GFR calc Af Amer 53 (*)    All other components within normal limits  URINALYSIS, ROUTINE W REFLEX MICROSCOPIC (NOT AT [facility]) - Abnormal; Notable for  the following:    Glucose, UA >1000 (*)    Hgb urine dipstick LARGE (*)    Ketones, ur 40 (*)    Protein, ur 100 (*)    All other components within normal limits  CBG MONITORING, ED - Abnormal; Notable for the following:    Glucose-Capillary 364 (*)    All other components within normal limits  CBG MONITORING, ED - Abnormal; Notable for the following:    Glucose-Capillary 313 (*)    All other components within normal limits  LIPASE, BLOOD  URINE MICROSCOPIC-ADD ON  I-STAT BETA HCG BLOOD, ED ([facility], [facility], [facility] ONLY)    Imaging Review No results found. I have personally reviewed and evaluated these images and lab results as part of my medical decision-making.  Filed Vitals:   04/25/15 1756 04/25/15 2031  BP: 172/94 178/108  Pulse: 108 104  Temp: 98.1 F (36.7 C) 98.4 F (36.9 C)  Resp: 20 18  MDM   Final diagnoses:  Hyperglycemia  Non-intractable vomiting with nausea, vomiting of unspecified type    Patient presents with abdominal pain, nausea, vomiting, diarrhea, generalized weakness and headache. History of type 1 diabetes. She states she did not take her insulin today. VSS. Exam revealed mild tenderness in LLQ, no peritoneal signs. CBG 364, no anion gap. Pt given zofran, IVF and 8U novolog. Hgb 10.5. Remaining labs unremarkable. Pregnancy negative. On reexamination patient reports she is feeling better. Patient able to tolerate by mouth in the ED. Repeat CBG trending down. I suspect pt's LLQ pain may likely be due to muscle strain associated with multiple episodes of vomiting and do not feel that imaging is warranted at this time. I suspect pt' sxs may be due to viral gastroenteritis resulting in hyperglycemia, however due to pt not being in DKA during ED stay and tolerating PO I feel she is appropriate to be d/c home with close PCP follow up.   Evaluation does not show pathology requring ongoing emergent intervention or admission. Pt is hemodynamically stable and mentating  appropriately. Discussed findings/results and plan with patient/guardian, who agrees with plan. All questions answered. Return precautions discussed and outpatient follow up given.      Chesley Noon [place], [place] 04/26/15 0122  Merrily Pew, MD 04/28/15 [phone]

## 2015-04-25 NOTE — Discharge Instructions (Signed)
Continue taking your insulin at home. You may also take your prescription of Zofran as prescribed as needed for nausea. Follow-up with your primary care provider in 2 days. Return to the emergency department if symptoms worsen or new onset of fever, cough, difficulty breathing, chest pain, abdominal pain, unable to keep food/fluids down, urinary symptoms.

## 2015-06-03 ENCOUNTER — Encounter (HOSPITAL_COMMUNITY): Payer: Self-pay

## 2015-06-03 ENCOUNTER — Emergency Department (HOSPITAL_COMMUNITY)
Admission: EM | Admit: 2015-06-03 | Discharge: 2015-06-03 | Disposition: A | Discharge status: Home / Self Care | Payer: 59 | Attending: Emergency Medicine | Admitting: Emergency Medicine

## 2015-06-03 DIAGNOSIS — I10 Essential (primary) hypertension: Secondary | ICD-10-CM | POA: Diagnosis not present

## 2015-06-03 DIAGNOSIS — Z3202 Encounter for pregnancy test, result negative: Secondary | ICD-10-CM | POA: Insufficient documentation

## 2015-06-03 DIAGNOSIS — Z794 Long term (current) use of insulin: Secondary | ICD-10-CM | POA: Diagnosis not present

## 2015-06-03 DIAGNOSIS — R630 Anorexia: Secondary | ICD-10-CM | POA: Diagnosis not present

## 2015-06-03 DIAGNOSIS — Z79899 Other long term (current) drug therapy: Secondary | ICD-10-CM | POA: Insufficient documentation

## 2015-06-03 DIAGNOSIS — K5900 Constipation, unspecified: Secondary | ICD-10-CM | POA: Diagnosis not present

## 2015-06-03 DIAGNOSIS — E1140 Type 2 diabetes mellitus with diabetic neuropathy, unspecified: Secondary | ICD-10-CM | POA: Insufficient documentation

## 2015-06-03 DIAGNOSIS — R112 Nausea with vomiting, unspecified: Secondary | ICD-10-CM | POA: Diagnosis not present

## 2015-06-03 LAB — COMPREHENSIVE METABOLIC PANEL
ALBUMIN: 3.3 g/dL — AB (ref 3.5–5.0)
ALT: 15 U/L (ref 14–54)
AST: 17 U/L (ref 15–41)
Alkaline Phosphatase: 81 U/L (ref 38–126)
Anion gap: 7 (ref 5–15)
BILIRUBIN TOTAL: 0.3 mg/dL (ref 0.3–1.2)
BUN: 25 mg/dL — AB (ref 6–20)
CO2: 25 mmol/L (ref 22–32)
CREATININE: 1.56 mg/dL — AB (ref 0.44–1.00)
Calcium: 8.7 mg/dL — ABNORMAL LOW (ref 8.9–10.3)
Chloride: 101 mmol/L (ref 101–111)
GFR calc Af Amer: 50 mL/min — ABNORMAL LOW (ref >60)
GFR, EST NON AFRICAN AMERICAN: 43 mL/min — AB (ref >60)
Glucose, Bld: 226 mg/dL — ABNORMAL HIGH (ref 65–99)
Potassium: 4 mmol/L (ref 3.5–5.1)
SODIUM: 133 mmol/L — AB (ref 135–145)
TOTAL PROTEIN: 7.4 g/dL (ref 6.5–8.1)

## 2015-06-03 LAB — CBC
HCT: 30.2 % — ABNORMAL LOW (ref 36.0–46.0)
Hemoglobin: 10 g/dL — ABNORMAL LOW (ref 12.0–15.0)
MCH: 29.6 pg (ref 26.0–34.0)
MCHC: 33.1 g/dL (ref 30.0–36.0)
MCV: 89.3 fL (ref 78.0–100.0)
PLATELETS: 316 10*3/uL (ref 150–400)
RBC: 3.38 MIL/uL — ABNORMAL LOW (ref 3.87–5.11)
RDW: 12.5 % (ref 11.5–15.5)
WBC: 6.7 10*3/uL (ref 4.0–10.5)

## 2015-06-03 LAB — URINALYSIS, ROUTINE W REFLEX MICROSCOPIC
BILIRUBIN URINE: NEGATIVE
Glucose, UA: >1000 mg/dL — AB
KETONES UR: NEGATIVE mg/dL
Leukocytes, UA: NEGATIVE
NITRITE: NEGATIVE
Specific Gravity, Urine: 1.025 (ref 1.005–1.030)
pH: 6.5 (ref 5.0–8.0)

## 2015-06-03 LAB — URINE MICROSCOPIC-ADD ON

## 2015-06-03 LAB — I-STAT BETA HCG BLOOD, ED (MC, WL, AP ONLY)

## 2015-06-03 LAB — LIPASE, BLOOD: LIPASE: 18 U/L (ref 11–51)

## 2015-06-03 MED ORDER — ONDANSETRON 4 MG PO TBDP
4.0000 mg | ORAL_TABLET | Freq: Once | ORAL | Status: AC | PRN
  Administered 2015-06-03: 4 mg via ORAL
  Filled 2015-06-03: qty 1

## 2015-06-03 MED ORDER — METOCLOPRAMIDE HCL 10 MG PO TABS: 10.0000 mg | ORAL_TABLET | Freq: Three times a day (TID) | ORAL | Status: DC | PRN

## 2015-06-03 MED ORDER — METOCLOPRAMIDE HCL 5 MG/ML IJ SOLN
10.0000 mg | Freq: Once | INTRAMUSCULAR | Status: AC
  Administered 2015-06-03: 10 mg via INTRAMUSCULAR
  Filled 2015-06-03: qty 2

## 2015-06-03 NOTE — ED Provider Notes (Signed)
CSN: [id number]     Arrival date & time 06/03/15  0911 History   First MD Initiated Contact with Patient 06/03/15 1132     Chief Complaint  Patient presents with  . Emesis      Patient is a 35 y.o. female presenting with vomiting. The history is provided by the patient.  Emesis Severity:  Mild Associated symptoms: no abdominal pain, no diarrhea and no headaches    patient has nausea with some vomiting. No diarrhea. She's had it over the last 2 months. States she was seen in the ER for a couple times without a clear cause. She is diabetic. Has history of neuropathy. Also has had some constipation and time. No previous abdominal surgery. She's had a decreased appetite also. Some dull upper abdominal pain.  Past Medical History  Diagnosis Date  . Diabetes mellitus without complication ([place])   . Diabetic neuropathy ([place])   . HA (headache)   . Hypertension    Past Surgical History  Procedure Laterality Date  . None     Family History  Problem Relation Age of Onset  . Hyperlipidemia Mother   . Asthma Daughter   . Diabetes Maternal Grandmother   . Kidney disease Maternal Grandfather   . Kidney disease Paternal Grandmother    Social History  Substance Use Topics  . Smoking status: Never Smoker   . Smokeless tobacco: Never Used  . Alcohol Use: 2.0 oz/week    4 Standard drinks or equivalent per week     Comment: 2-3 a week   OB History    No data available     Review of Systems  Constitutional: Positive for appetite change. Negative for activity change, fatigue and unexpected weight change.  Eyes: Negative for pain.  Respiratory: Negative for chest tightness and shortness of breath.   Cardiovascular: Negative for chest pain and leg swelling.  Gastrointestinal: Positive for nausea, vomiting and constipation. Negative for abdominal pain and diarrhea.  Genitourinary: Negative for flank pain.  Musculoskeletal: Negative for back pain and neck stiffness.  Skin: Negative for  rash.  Neurological: Negative for weakness, numbness and headaches.  Psychiatric/Behavioral: Negative for behavioral problems.      Allergies  Review of patient's allergies indicates no known allergies.  Home Medications   Prior to Admission medications   Medication Sig Start Date End Date Taking? Authorizing Provider  carbamazepine (TEGRETOL) 200 MG tablet Take 200 mg by mouth daily.   Yes Historical Provider, MD  chlorhexidine (PERIDEX) 0.12 % solution SWISH AND SPIT OUT 10 MLS TWICE A DAY 05/24/15  Yes Historical Provider, MD  cyclobenzaprine (FLEXERIL) 5 MG tablet Take 5 mg by mouth 2 (two) times daily as needed for muscle spasms.  04/04/15  Yes Historical Provider, MD  DULoxetine (CYMBALTA) 30 MG capsule Take 30 mg by mouth daily. 05/07/15  Yes Historical Provider, MD  gabapentin (NEURONTIN) 300 MG capsule Take 300 mg by mouth 4 (four) times daily.   Yes Historical Provider, MD  hydrochlorothiazide (HYDRODIURIL) 25 MG tablet Take 25 mg by mouth daily.  03/11/15  Yes Historical Provider, MD  HYDROcodone-acetaminophen (NORCO/VICODIN) 5-325 MG tablet Take 1 tablet by mouth every 6 (six) hours as needed for moderate pain.   Yes Historical Provider, MD  insulin aspart protamine- aspart (NOVOLOG MIX 70/30) (70-30) 100 UNIT/ML injection Inject 0.55 mLs (55 Units total) into the skin 2 (two) times daily with a meal. 04/04/15  Yes Geradine Girt, DO  losartan (COZAAR) 50 MG tablet Take 50 mg  by mouth daily. 05/07/15  Yes Historical Provider, MD  Pediatric Multivit-Minerals-C (RA GUMMY VITAMINS & MINERALS PO) Take 2 tablets by mouth daily.   Yes Historical Provider, MD  traMADol (ULTRAM) 50 MG tablet Take 50 mg by mouth every 6 (six) hours as needed. Dental pain. 05/27/15  Yes Historical Provider, MD  amitriptyline (ELAVIL) 50 MG tablet Take 1 tablet (50 mg total) by mouth at bedtime. PATIENT NEEDS OFFICE VISIT FOR ADDITIONAL REFILLS Patient not taking: Reported on 06/03/2015 04/04/15   Geradine Girt,  DO  metoCLOPramide (REGLAN) 10 MG tablet Take 1 tablet (10 mg total) by mouth every 8 (eight) hours as needed for nausea or vomiting. 06/03/15   Davonna Belling, MD  ondansetron (ZOFRAN) 4 MG tablet Take 1 tablet (4 mg total) by mouth every 8 (eight) hours as needed for nausea or vomiting. Patient not taking: Reported on 06/03/2015 04/04/15   Tomi Bamberger Vann, DO   BP 150/99 mmHg  Pulse 107  Temp(Src) 98.2 F (36.8 C) (Oral)  Resp 17  SpO2 100%  LMP 05/05/2015 (Approximate) Physical Exam  Constitutional: She is oriented to person, place, and time. She appears well-developed and well-nourished.  HENT:  Head: Normocephalic.  Eyes: EOM are normal. Pupils are equal, round, and reactive to light.  Neck: Normal range of motion. Neck supple.  Cardiovascular: Normal rate and regular rhythm.   Pulmonary/Chest: Effort normal and breath sounds normal. No respiratory distress.  Abdominal: Soft. She exhibits no distension. There is tenderness.  Musculoskeletal: Normal range of motion.  Neurological: She is alert and oriented to person, place, and time. No cranial nerve deficit.  Skin: Skin is warm and dry.  Psychiatric: Her speech is normal.  Nursing note and vitals reviewed.   ED Course  Procedures (including critical care time) Labs Review Labs Reviewed  COMPREHENSIVE METABOLIC PANEL - Abnormal; Notable for the following:    Sodium 133 (*)    Glucose, Bld 226 (*)    BUN 25 (*)    Creatinine, Ser 1.56 (*)    Calcium 8.7 (*)    Albumin 3.3 (*)    GFR calc non Af Amer 43 (*)    GFR calc Af Amer 50 (*)    All other components within normal limits  CBC - Abnormal; Notable for the following:    RBC 3.38 (*)    Hemoglobin 10.0 (*)    HCT 30.2 (*)    All other components within normal limits  URINALYSIS, ROUTINE W REFLEX MICROSCOPIC (NOT AT [facility]) - Abnormal; Notable for the following:    APPearance HAZY (*)    Glucose, UA >1000 (*)    Hgb urine dipstick LARGE (*)    Protein, ur >300 (*)     All other components within normal limits  URINE MICROSCOPIC-ADD ON - Abnormal; Notable for the following:    Squamous Epithelial / LPF 0-5 (*)    Bacteria, UA RARE (*)    All other components within normal limits  LIPASE, BLOOD  I-STAT BETA HCG BLOOD, ED ([facility], [facility], [facility] ONLY)    Imaging Review No results found. I have personally reviewed and evaluated these images and lab results as part of my medical decision-making.   EKG Interpretation None      MDM   Final diagnoses:  Nausea and vomiting, intractability of vomiting not specified, unspecified vomiting type    Patient with nausea and vomiting. Feels better after treatment. Has tolerated orals be discharged home. While working will follow-up with GI.  Will add Reglan since patient has diabetes and could have gastroparesis.    Davonna Belling, MD 06/03/15 [phone]

## 2015-06-03 NOTE — ED Notes (Signed)
Pt states emesis x weeks but worse today.  Mainly in mornings but pregnancy negative x 2.  Using zofran with no relief.  Slight abdominal pain.

## 2015-06-03 NOTE — Discharge Instructions (Signed)

## 2015-06-07 ENCOUNTER — Other Ambulatory Visit (HOSPITAL_COMMUNITY): Payer: Self-pay | Admitting: Physician Assistant

## 2015-06-07 DIAGNOSIS — D649 Anemia, unspecified: Secondary | ICD-10-CM

## 2015-06-07 DIAGNOSIS — R112 Nausea with vomiting, unspecified: Secondary | ICD-10-CM

## 2015-06-15 ENCOUNTER — Ambulatory Visit (HOSPITAL_COMMUNITY): Payer: 59

## 2016-06-08 ENCOUNTER — Ambulatory Visit (INDEPENDENT_AMBULATORY_CARE_PROVIDER_SITE_OTHER): Payer: 59 | Admitting: Endocrinology

## 2016-06-08 ENCOUNTER — Encounter: Payer: Self-pay | Admitting: Endocrinology

## 2016-06-08 VITALS — BP 136/84 | HR 87 | Ht 68.0 in | Wt 193.0 lb

## 2016-06-08 DIAGNOSIS — E1065 Type 1 diabetes mellitus with hyperglycemia: Secondary | ICD-10-CM | POA: Diagnosis not present

## 2016-06-08 DIAGNOSIS — IMO0001 Reserved for inherently not codable concepts without codable children: Secondary | ICD-10-CM

## 2016-06-08 LAB — POCT GLYCOSYLATED HEMOGLOBIN (HGB A1C): Hemoglobin A1C: 7.3

## 2016-06-08 MED ORDER — INSULIN GLARGINE 100 UNIT/ML SOLOSTAR PEN: 50.0000 [IU] | PEN_INJECTOR | SUBCUTANEOUS | 99 refills | Status: DC

## 2016-06-08 NOTE — Progress Notes (Signed)
Subjective:    Patient ID: Carrie [name], female    DOB: 15-Mar-1981, 36 y.o.   MRN: [id number]  HPI pt is self-referred for diabetes.  Pt states DM was dx'ed in 1999; she has moderate neuropathy of the lower extremities; she has associated renal insufficiency and retinopathy; she has been on insulin since 2003 pregnancy.  pt says his diet and exercise are; she has never had GDM or pancreatitis.  Last episode of severe hypoglycemia was last week.   Last episode of DKA was in late 2016. She says she sometimes misses the insulin, in order to "try natural cures."   Past Medical History:  Diagnosis Date  . Diabetes mellitus without complication ([place])   . Diabetic neuropathy ([place])   . HA (headache)   . Hypertension     Past Surgical History:  Procedure Laterality Date  . None      Social History   Social History  . Marital status: Single    Spouse name: N/A  . Number of children: 1  . Years of education: 75   Occupational History  . [employer]   Social History Main Topics  . Smoking status: Never Smoker  . Smokeless tobacco: Never Used  . Alcohol use 2.0 oz/week    4 Standard drinks or equivalent per week     Comment: 2-3 a week  . Drug use: No  . Sexual activity: Yes   Other Topics Concern  . Not on file   Social History Narrative   Patient lives at home with her father and daughter. Patient works full time at [employer] .   Education some college.   Right handed   Caffeine Five sodas daily.             Current Outpatient Prescriptions on File Prior to Visit  Medication Sig Dispense Refill  . carbamazepine (TEGRETOL) 200 MG tablet Take 200 mg by mouth daily.    . metoCLOPramide (REGLAN) 10 MG tablet Take 1 tablet (10 mg total) by mouth every 8 (eight) hours as needed for nausea or vomiting. 10 tablet 0  . chlorhexidine (PERIDEX) 0.12 % solution SWISH AND SPIT OUT 10 MLS TWICE A DAY  0  . cyclobenzaprine (FLEXERIL) 5 MG tablet Take 5 mg by mouth 2  (two) times daily as needed for muscle spasms.   2  . DULoxetine (CYMBALTA) 30 MG capsule Take 30 mg by mouth daily.    Marland Kitchen gabapentin (NEURONTIN) 300 MG capsule Take 300 mg by mouth 4 (four) times daily.    . hydrochlorothiazide (HYDRODIURIL) 25 MG tablet Take 25 mg by mouth daily.   5  . HYDROcodone-acetaminophen (NORCO/VICODIN) 5-325 MG tablet Take 1 tablet by mouth every 6 (six) hours as needed for moderate pain.    Marland Kitchen losartan (COZAAR) 50 MG tablet Take 50 mg by mouth daily.    . ondansetron (ZOFRAN) 4 MG tablet Take 1 tablet (4 mg total) by mouth every 8 (eight) hours as needed for nausea or vomiting. (Patient not taking: Reported on 06/03/2015) 20 tablet 0  . Pediatric Multivit-Minerals-C (RA GUMMY VITAMINS & MINERALS PO) Take 2 tablets by mouth daily.    . traMADol (ULTRAM) 50 MG tablet Take 50 mg by mouth every 6 (six) hours as needed. Dental pain.     No current facility-administered medications on file prior to visit.     No Known Allergies  Family History  Problem Relation Age of Onset  . Hyperlipidemia Mother   .  Asthma Daughter   . Diabetes Maternal Grandmother   . Kidney disease Maternal Grandfather   . Kidney disease Paternal Grandmother     BP 136/84   Pulse 87   Ht 5\' 8"  (1.727 m)   Wt 193 lb (87.5 kg)   SpO2 98%   BMI 29.35 kg/m     Review of Systems denies weight loss, blurry vision, headache, chest pain, sob, excessive diaphoresis, depression, rhinorrhea, and easy bruising.  She has chronic nausea, leg cramps, cold intolerance, and frequent urination.      Objective:   Physical Exam VS: see vs page GEN: no distress HEAD: head: no deformity eyes: no periorbital swelling, no proptosis external nose and ears are normal mouth: no lesion seen NECK: supple, thyroid is not enlarged.  CHEST WALL: no deformity.  LUNGS: clear to auscultation.  CV: reg rate and rhythm, no murmur.  ABD: abdomen is soft, nontender.  no hepatosplenomegaly.  not distended.  no  hernia.  MUSCULOSKELETAL: muscle bulk and strength are grossly normal.  no obvious joint swelling.  gait is normal and steady EXTEMITIES: no deformity.  no ulcer on the feet.  feet are of normal color and temp.  Trace bilat leg edema.  PULSES: dorsalis pedis intact bilat.  no carotid bruit.  NEURO:  cn 2-12 grossly intact.   readily moves all 4's.  sensation is intact to touch on the feet, but decreased from normal.  SKIN:  Normal texture and temperature.  No rash or suspicious lesion is visible.   NODES:  None palpable at the neck.   PSYCH: alert, well-oriented.  Does not appear anxious nor depressed.    Lab Results  Component Value Date   CREATININE 1.56 (H) 06/03/2015   BUN 25 (H) 06/03/2015   NA 133 (L) 06/03/2015   K 4.0 06/03/2015   CL 101 06/03/2015   CO2 25 06/03/2015   Lab Results  Component Value Date   HGBA1C 7.3 06/08/2016   I have reviewed outside records, and summarized: Pt was seen in ER, with nausea and hyperglycemia.  Question of gastroparesis is raised.      Assessment & Plan:  Insulin-requiring type 2 DM, with renal insufficiency:  Noncompliance with insulin: she is not a candidate for multiple daily injections. Nausea: if this does not improve, we'll check gastric emptying study.   Patient is advised the following: Patient Instructions  good diet and exercise significantly improve the control of your diabetes.  please let me know if you wish to be referred to a dietician.  high blood sugar is very risky to your health.  you should see an eye doctor and dentist every year.  It is very important to get all recommended vaccinations.  Controlling your blood pressure and cholesterol drastically reduces the damage diabetes does to your body.  Those who smoke should quit.  Please discuss these with your doctor.  check your blood sugar twice a day.  vary the time of day when you check, between before the 3 meals, and at bedtime.  also check if you have symptoms of  your blood sugar being too high or too low.  please keep a record of the readings and bring it to your next appointment here (or you can bring the meter itself).  You can write it on any piece of paper.  please call us sooner if your blood sugar goes below 70, or if you have a lot of readings over 200.   Please change the current insulin  to lantus, 50 units each morning.   Please come back for a follow-up appointment in 2 weeks.   It is really important to never miss the insulin.

## 2016-06-08 NOTE — Patient Instructions (Addendum)
good diet and exercise significantly improve the control of your diabetes.  please let me know if you wish to be referred to a dietician.  high blood sugar is very risky to your health.  you should see an eye doctor and dentist every year.  It is very important to get all recommended vaccinations.  Controlling your blood pressure and cholesterol drastically reduces the damage diabetes does to your body.  Those who smoke should quit.  Please discuss these with your doctor.  check your blood sugar twice a day.  vary the time of day when you check, between before the 3 meals, and at bedtime.  also check if you have symptoms of your blood sugar being too high or too low.  please keep a record of the readings and bring it to your next appointment here (or you can bring the meter itself).  You can write it on any piece of paper.  please call us sooner if your blood sugar goes below 70, or if you have a lot of readings over 200.   Please change the current insulin to lantus, 50 units each morning.   Please come back for a follow-up appointment in 2 weeks.   It is really important to never miss the insulin.

## 2016-06-27 ENCOUNTER — Ambulatory Visit: Payer: BLUE CROSS/BLUE SHIELD | Admitting: Endocrinology

## 2016-08-11 ENCOUNTER — Other Ambulatory Visit: Payer: Self-pay | Admitting: Internal Medicine

## 2016-08-11 DIAGNOSIS — E010 Iodine-deficiency related diffuse (endemic) goiter: Secondary | ICD-10-CM

## 2016-08-18 ENCOUNTER — Ambulatory Visit
Admission: RE | Admit: 2016-08-18 | Discharge: 2016-08-18 | Disposition: A | Discharge status: Home / Self Care | Payer: BLUE CROSS/BLUE SHIELD | Source: Ambulatory Visit | Attending: Internal Medicine | Admitting: Internal Medicine

## 2016-08-18 DIAGNOSIS — E010 Iodine-deficiency related diffuse (endemic) goiter: Secondary | ICD-10-CM

## 2016-08-29 ENCOUNTER — Emergency Department (HOSPITAL_COMMUNITY)
Admission: EM | Admit: 2016-08-29 | Discharge: 2016-08-29 | Disposition: A | Discharge status: Home / Self Care | Payer: BLUE CROSS/BLUE SHIELD | Attending: Emergency Medicine | Admitting: Emergency Medicine

## 2016-08-29 ENCOUNTER — Encounter (HOSPITAL_COMMUNITY): Payer: Self-pay

## 2016-08-29 DIAGNOSIS — I10 Essential (primary) hypertension: Secondary | ICD-10-CM | POA: Diagnosis not present

## 2016-08-29 DIAGNOSIS — E1140 Type 2 diabetes mellitus with diabetic neuropathy, unspecified: Secondary | ICD-10-CM | POA: Diagnosis not present

## 2016-08-29 DIAGNOSIS — R110 Nausea: Secondary | ICD-10-CM | POA: Insufficient documentation

## 2016-08-29 DIAGNOSIS — Z794 Long term (current) use of insulin: Secondary | ICD-10-CM | POA: Insufficient documentation

## 2016-08-29 HISTORY — DX: Gastroparesis: K31.84

## 2016-08-29 HISTORY — DX: Polyneuropathy, unspecified: G62.9

## 2016-08-29 HISTORY — DX: Trigeminal neuralgia: G50.0

## 2016-08-29 LAB — BASIC METABOLIC PANEL
ANION GAP: 8 (ref 5–15)
BUN: 48 mg/dL — AB (ref 6–20)
CHLORIDE: 102 mmol/L (ref 101–111)
CO2: 23 mmol/L (ref 22–32)
Calcium: 8.7 mg/dL — ABNORMAL LOW (ref 8.9–10.3)
Creatinine, Ser: 6.46 mg/dL — ABNORMAL HIGH (ref 0.44–1.00)
GFR calc Af Amer: 9 mL/min — ABNORMAL LOW (ref >60)
GFR, EST NON AFRICAN AMERICAN: 8 mL/min — AB (ref >60)
Glucose, Bld: 276 mg/dL — ABNORMAL HIGH (ref 65–99)
POTASSIUM: 4.4 mmol/L (ref 3.5–5.1)
SODIUM: 133 mmol/L — AB (ref 135–145)

## 2016-08-29 LAB — URINALYSIS, ROUTINE W REFLEX MICROSCOPIC
BACTERIA UA: NONE SEEN
BILIRUBIN URINE: NEGATIVE
Glucose, UA: >=500 mg/dL — AB
Hgb urine dipstick: NEGATIVE
KETONES UR: NEGATIVE mg/dL
LEUKOCYTES UA: NEGATIVE
NITRITE: NEGATIVE
Specific Gravity, Urine: 1.009 (ref 1.005–1.030)
pH: 7 (ref 5.0–8.0)

## 2016-08-29 LAB — PREGNANCY, URINE: PREG TEST UR: NEGATIVE

## 2016-08-29 LAB — CBG MONITORING, ED: Glucose-Capillary: 266 mg/dL — ABNORMAL HIGH (ref 65–99)

## 2016-08-29 LAB — CBC
HEMATOCRIT: 24.8 % — AB (ref 36.0–46.0)
HEMOGLOBIN: 8.3 g/dL — AB (ref 12.0–15.0)
MCH: 29.9 pg (ref 26.0–34.0)
MCHC: 33.5 g/dL (ref 30.0–36.0)
MCV: 89.2 fL (ref 78.0–100.0)
Platelets: 295 10*3/uL (ref 150–400)
RBC: 2.78 MIL/uL — ABNORMAL LOW (ref 3.87–5.11)
RDW: 13.3 % (ref 11.5–15.5)
WBC: 5.2 10*3/uL (ref 4.0–10.5)

## 2016-08-29 LAB — BLOOD GAS, VENOUS
ACID-BASE DEFICIT: 1.9 mmol/L (ref 0.0–2.0)
Bicarbonate: 22.8 mmol/L (ref 20.0–28.0)
FIO2: 0.21
O2 Saturation: 97 %
PCO2 VEN: 40.8 mmHg — AB (ref 44.0–60.0)
Patient temperature: 98.7
pH, Ven: 7.366 (ref 7.250–7.430)
pO2, Ven: 99 mmHg — ABNORMAL HIGH (ref 32.0–45.0)

## 2016-08-29 MED ORDER — SODIUM CHLORIDE 0.9 % IV BOLUS (SEPSIS)
1000.0000 mL | Freq: Once | INTRAVENOUS | Status: AC
  Administered 2016-08-29: 1000 mL via INTRAVENOUS

## 2016-08-29 MED ORDER — ONDANSETRON HCL 4 MG/2ML IJ SOLN
4.0000 mg | Freq: Once | INTRAMUSCULAR | Status: AC
  Administered 2016-08-29: 4 mg via INTRAVENOUS
  Filled 2016-08-29: qty 2

## 2016-08-29 MED ORDER — TORSEMIDE 20 MG PO TABS
20.0000 mg | ORAL_TABLET | Freq: Every day | ORAL | Status: DC
  Administered 2016-08-29: 20 mg via ORAL
  Filled 2016-08-29: qty 1

## 2016-08-29 NOTE — ED Notes (Signed)
Pt left before receiving discharge instructions. Pt IV removed by tech beforehand.

## 2016-08-29 NOTE — ED Provider Notes (Signed)
[facility] DEPT Provider Note   CSN: [id number] Arrival date & time: 08/29/16  1734     History   Chief Complaint Chief Complaint  Patient presents with  . Nausea  . Weakness  . Hyperglycemia    HPI Carrie [name] is a 36 y.o. female PMHx of DM type 1, gastroparesis, anemia, and recently diagnosed with worsening renal failure since January reports today for nausea x 2 weeks. She reports associated lower abdominal pain and generalized weakness. She reports her symptoms have been worsening the last 2 days, dull and now achy, intermittent, non radiating, 5 or 6/10 "not too bad". She has tried zofran with some relief. She reports smelling makes her symptoms worse. She admits to decreased appetite. She reports only having apple sauce in the last 24 hours. She denies fevers, chest pain, shortness of breath, or any other symptoms. She denies urinary symptoms, back pain. She denies hematemesis, diarrhea, blood in her stool. She reports coming to ED because she did not want to get into DKA, which she reports having DKA in the past. She states it feels like a viral bug. She admits to taking classes for possible kidney transplant.   Pt reports going to the club this weekend and passing out from drinking alcohol. She reports being picked up by paramedics.   She denies taking her insulin and taking her night time medications.   She reports being closely followed by kidney doctor and PCP.  She was discontinued from gastroparesis medication, reglan, 2 weeks ago due to possibility of affecting kidney.   The history is provided by the patient. No language interpreter was used.  Weakness  Pertinent negatives include no shortness of breath, no chest pain and no vomiting.  Hyperglycemia  Associated symptoms: abdominal pain, nausea and weakness   Associated symptoms: no chest pain, no dysuria, no fever, no polyuria, no shortness of breath and no vomiting     Past Medical History:  Diagnosis Date    . Diabetes mellitus without complication ([place])   . Diabetic neuropathy ([place])   . Gastroparesis   . HA (headache)   . Hypertension   . Neuropathy   . Renal disorder   . Trigeminal neuralgia     Patient Active Problem List   Diagnosis Date Noted  . Nausea with vomiting 04/04/2015  . AKI (acute kidney injury) ([place]) 04/04/2015  . DKA (diabetic ketoacidoses) ([place]) 04/03/2015  . Trigeminal neuralgia 12/12/2013  . HA (headache)   . DKA, type 2 ([place]) 10/24/2012  . Contact dermatitis 10/24/2012  . SORE THROAT 03/22/2010  . 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 FATIGUE 05/25/2009  . VAGINAL DISCHARGE 01/28/2009  . Carbuncle and furuncle of unspecified site 12/29/2008  . HYPERCHOLESTEROLEMIA 06/12/2008  . MICROALBUMINURIA 06/12/2008  . MONILIAL VAGINITIS 11/14/2007  . INGUINAL PAIN, BILATERAL 11/14/2007  . DYSURIA 10/17/2007  . CYSTITIS 09/05/2007  . DELAYED MENSES 09/05/2007  . RHINOCONJUNCTIVITIS, ALLERGIC 07/19/2007  . DENTAL PAIN 03/28/2007  . Uncontrolled type 1 diabetes mellitus ([place]) 09/01/2006  . HYPERLIPIDEMIA 09/01/2006  . PELVIC INFLAMMATORY DISEASE 09/01/2006  . ABORTION, SPONTANEOUS 09/01/2006  . ABSCESS 09/01/2006  . HX, PERSONAL, PAST NONCOMPLIANCE 09/01/2006    Past Surgical History:  Procedure Laterality Date  . EYE SURGERY    . None      OB History    No data available       Home Medications    Prior to Admission medications   Medication Sig Start Date End Date Taking? Authorizing Provider  carbamazepine (  TEGRETOL) 200 MG tablet Take 200 mg by mouth every evening.    Yes [provider]  gabapentin (NEURONTIN) 300 MG capsule Take 300 mg by mouth 2 (two) times daily.    Yes [provider]  hydrALAZINE (APRESOLINE) 50 MG tablet Take 50 mg by mouth 3 (three) times daily. 08/25/16  Yes [provider]  Insulin Glargine (LANTUS SOLOSTAR) 100 UNIT/ML Solostar Pen Inject 50 Units into the skin every morning. And pen needles 1/day Patient  taking differently: Inject 30 Units into the skin every morning. And pen needles 1/day 06/08/16  Yes Renato Shin, MD  labetalol (NORMODYNE) 100 MG tablet Take 100 mg by mouth 2 (two) times daily.   Yes [provider]  ondansetron (ZOFRAN) 4 MG tablet Take 1 tablet (4 mg total) by mouth every 8 (eight) hours as needed for nausea or vomiting. Patient taking differently: Take 4 mg by mouth 4 (four) times daily as needed for nausea or vomiting.  04/04/15  Yes Eulogio Bear U, DO  sevelamer carbonate (RENVELA) 800 MG tablet Take 800 mg by mouth 3 (three) times daily with meals.   Yes [provider]  torsemide (DEMADEX) 20 MG tablet Take 20 mg by mouth every evening.   Yes [provider]  DULoxetine (CYMBALTA) 30 MG capsule Take 30 mg by mouth daily. 05/07/15   [provider]  labetalol (NORMODYNE) 300 MG tablet Take 300 mg by mouth 3 (three) times daily. 08/25/16   [provider]  losartan (COZAAR) 50 MG tablet Take 50 mg by mouth daily. 05/07/15   [provider]    Family History Family History  Problem Relation Age of Onset  . Hyperlipidemia Mother   . Asthma Daughter   . Diabetes Maternal Grandmother   . Kidney disease Maternal Grandfather   . Kidney disease Paternal Grandmother     Social History Social History  Substance Use Topics  . Smoking status: Never Smoker  . Smokeless tobacco: Never Used  . Alcohol use No     Comment: former use     Allergies   Patient has no known allergies.   Review of Systems Review of Systems  Constitutional: Negative for fever.  Respiratory: Negative for cough and shortness of breath.   Cardiovascular: Negative for chest pain.  Gastrointestinal: Positive for abdominal pain and nausea. Negative for diarrhea and vomiting.  Endocrine: Negative for polyuria.  Genitourinary: Negative for difficulty urinating, dysuria, vaginal bleeding, vaginal discharge and vaginal pain.  Neurological:  Positive for weakness.     Physical Exam Updated Vital Signs BP (!) 186/107 (BP Location: Left Arm)   Pulse 100   Temp 98.7 F (37.1 C) (Oral)   Resp 18   Ht 5\' 8"  (1.727 m)   Wt 90.7 kg   LMP 07/16/2016   SpO2 100%   BMI 30.41 kg/m   Physical Exam  Constitutional: She is oriented to person, place, and time. She appears well-developed and well-nourished. No distress.  Well appearing  HENT:  Head: Normocephalic and atraumatic.  Nose: Nose normal.  Mouth/Throat: Oropharynx is clear and moist.  Eyes: Conjunctivae and EOM are normal.  Neck: Normal range of motion.  Cardiovascular: Normal rate, normal heart sounds and intact distal pulses.   No murmur heard. Pulmonary/Chest: Effort normal and breath sounds normal. No respiratory distress. She has no wheezes. She has no rales.  Normal work of breathing. No respiratory distress noted.   Abdominal: Soft. Bowel sounds are normal. There is no tenderness. There  is no rebound and no guarding.  Soft and nontender. No focal tenderness at McBurney's point. Negative Murphy sign. No CVA tenderness.  Musculoskeletal: Normal range of motion.  Neurological: She is alert and oriented to person, place, and time.  Skin: Skin is warm. Capillary refill takes less than 2 seconds.  Psychiatric: She has a normal mood and affect. Her behavior is normal.  Nursing note and vitals reviewed.    ED Treatments / Results  Labs (all labs ordered are listed, but only abnormal results are displayed) Labs Reviewed  BASIC METABOLIC PANEL - Abnormal; Notable for the following:       Result Value   Sodium 133 (*)    Glucose, Bld 276 (*)    BUN 48 (*)    Creatinine, Ser 6.46 (*)    Calcium 8.7 (*)    GFR calc non Af Amer 8 (*)    GFR calc Af Amer 9 (*)    All other components within normal limits  CBC - Abnormal; Notable for the following:    RBC 2.78 (*)    Hemoglobin 8.3 (*)    HCT 24.8 (*)    All other components within normal limits  URINALYSIS,  ROUTINE W REFLEX MICROSCOPIC - Abnormal; Notable for the following:    Color, Urine STRAW (*)    Glucose, UA >=500 (*)    Protein, ur >=300 (*)    Squamous Epithelial / LPF 0-5 (*)    All other components within normal limits  BLOOD GAS, VENOUS - Abnormal; Notable for the following:    pCO2, Ven 40.8 (*)    pO2, Ven 99.0 (*)    All other components within normal limits  CBG MONITORING, ED - Abnormal; Notable for the following:    Glucose-Capillary 266 (*)    All other components within normal limits  PREGNANCY, URINE    EKG  EKG Interpretation None       Radiology No results found.  Procedures Procedures (including critical care time)  Medications Ordered in ED Medications  torsemide (DEMADEX) tablet 20 mg (20 mg Oral Given 08/29/16 2132)  sodium chloride 0.9 % bolus 1,000 mL (0 mLs Intravenous Stopped 08/29/16 2133)  ondansetron (ZOFRAN) injection 4 mg (4 mg Intravenous Given 08/29/16 2131)     Initial Impression / Assessment and Plan / ED Course  I have reviewed the triage vital signs and the nursing notes.  Pertinent labs & imaging results that were available during my care of the patient were reviewed by me and considered in my medical decision making (see chart for details).     Patient with symptoms of nausea and abdominal pain for 2 weeks. Acute on chronic kidney disease. Patient is afebrile, comfortable, hemodynamically stable. Slightly hypertensive. Heart and lung sounds are clear. Abdomen was soft and nontender with no rebound tenderness or guarding. No CVA tenderness. Negative Murphy sign. No focal tenderness at McBurney's point. Lab work did show similar findings to a previous visit to ED at [facility] where her creatinine was 5, she had an elevated BUN, and GFR was 10. Patient also had similar previous findings of hemoglobin of 8.3.   Patient's potassium is 4.4, bicarbonate 22.8.  Glucose at 266.   I spoke with Dr. Jeani Hawking, nephrology, he states that he  does not feel that patient would need an emergent dialysis at this time. He feels patient can follow-up tomorrow with her nephrologist.   Patient given Zofran here and fluids. Patient felt better and was able  to tolerate by mouth.  I feel safe to discharge this time. Patient given instruction to follow-up with her nephrologist tomorrow regarding today's visit as well as her gastrologist for management of her gastroparesis.  Patient is afebrile, hemodynamically stable and in no apparent distress prior to discharge. Patient is agreeable with assessment and plan verbally understands. Reasons to immediately return to ED discussed. Pt also seen and evaluated by Dr. Dayna Barker who agreed with assessment and plan.   Final Clinical Impressions(s) / ED Diagnoses   Final diagnoses:  Nausea    New Prescriptions Discharge Medication List as of 08/29/2016 11:40 PM       Bettey Costa, [place] 08/30/16 0024    Merrily Pew, MD 08/30/16 [phone]

## 2016-08-29 NOTE — Discharge Instructions (Signed)
Please follow up tomorrow with your nephrologist regarding today's visit. Please also follow-up with your gastroenterologist regarding your history of gastroparesis and today's visit.   Get help right away if: You have pain in your chest, neck, arm, or jaw. You feel extremely weak or you faint. You have vomit that is bright red or looks like coffee grounds. You have bloody or black stools or stools that look like tar. You have a severe headache, a stiff neck, or both. You have severe pain, cramping, or bloating in your abdomen. You have a rash. You have difficulty breathing or are breathing very quickly. Your heart is beating very quickly. Your skin feels cold and clammy. You feel confused. You have pain when you urinate. You have signs of dehydration, such as: Dark urine, very little, or no urine. Cracked lips. Dry mouth. Sunken eyes. Sleepiness. Weakness. Get help right away if: You have severe abdominal pain that does not improve with treatment. You have nausea that does not go away. You cannot keep fluids down.

## 2016-08-29 NOTE — ED Triage Notes (Addendum)
Patient c/o nausea and weakness. Patient states she has taken BP meds and zofran today, but continues to have nausea and weakness.  Patient states she went to the club this week and passed out. Patient states her BS dropped to 46.

## 2016-08-30 NOTE — ED Provider Notes (Signed)
Medical screening examination/treatment/procedure(s) were conducted as a shared visit with non-physician practitioner(s) and myself.  I personally evaluated the patient during the encounter.  36 year old female with acute on chronic kidney disease that is going to classes for transplant and considering possible dialysis at this point. She is here with nausea and weakness. Review of records it seems that her kidney function is low but worse than it was a month ago but not significantly so. Her doctor took her off Reglan secondary to the dystonic type reaction. Gastroparesis is worse since then as well. At time of my examination she is improved but is tearful secondary to her health conditions. Her abdomen is benign, she is a bit hypertensive otherwise her vital signs are reassuring. Patient likely needs to follow-up with nephrology and she wants to move her nephrology care here and already has a referral for the same. No acute indication for admission tonight. She is tolerating by mouth. She still for discharge for outpatient follow-up.     Alecxander Mainwaring, Corene Cornea, MD 08/30/16 [phone]

## 2016-08-31 ENCOUNTER — Encounter (HOSPITAL_COMMUNITY): Payer: Self-pay | Admitting: Emergency Medicine

## 2016-08-31 ENCOUNTER — Emergency Department (HOSPITAL_COMMUNITY)
Admission: EM | Admit: 2016-08-31 | Discharge: 2016-08-31 | Disposition: A | Discharge status: Home / Self Care | Payer: BLUE CROSS/BLUE SHIELD | Attending: Emergency Medicine | Admitting: Emergency Medicine

## 2016-08-31 DIAGNOSIS — I129 Hypertensive chronic kidney disease with stage 1 through stage 4 chronic kidney disease, or unspecified chronic kidney disease: Secondary | ICD-10-CM | POA: Diagnosis not present

## 2016-08-31 DIAGNOSIS — N189 Chronic kidney disease, unspecified: Secondary | ICD-10-CM | POA: Diagnosis not present

## 2016-08-31 DIAGNOSIS — R1114 Bilious vomiting: Secondary | ICD-10-CM | POA: Diagnosis present

## 2016-08-31 DIAGNOSIS — E1043 Type 1 diabetes mellitus with diabetic autonomic (poly)neuropathy: Secondary | ICD-10-CM | POA: Insufficient documentation

## 2016-08-31 DIAGNOSIS — I10 Essential (primary) hypertension: Secondary | ICD-10-CM

## 2016-08-31 DIAGNOSIS — K3184 Gastroparesis: Secondary | ICD-10-CM

## 2016-08-31 DIAGNOSIS — Z79899 Other long term (current) drug therapy: Secondary | ICD-10-CM | POA: Insufficient documentation

## 2016-08-31 LAB — URINALYSIS, ROUTINE W REFLEX MICROSCOPIC
BACTERIA UA: NONE SEEN
Bilirubin Urine: NEGATIVE
Glucose, UA: >=500 mg/dL — AB
KETONES UR: NEGATIVE mg/dL
LEUKOCYTES UA: NEGATIVE
Nitrite: NEGATIVE
Specific Gravity, Urine: 1.01 (ref 1.005–1.030)
pH: 7 (ref 5.0–8.0)

## 2016-08-31 LAB — COMPREHENSIVE METABOLIC PANEL
ALBUMIN: 3.8 g/dL (ref 3.5–5.0)
ALT: 20 U/L (ref 14–54)
AST: 26 U/L (ref 15–41)
Alkaline Phosphatase: 69 U/L (ref 38–126)
Anion gap: 10 (ref 5–15)
BUN: 44 mg/dL — AB (ref 6–20)
CHLORIDE: 103 mmol/L (ref 101–111)
CO2: 25 mmol/L (ref 22–32)
Calcium: 9.3 mg/dL (ref 8.9–10.3)
Creatinine, Ser: 6.46 mg/dL — ABNORMAL HIGH (ref 0.44–1.00)
GFR calc Af Amer: 9 mL/min — ABNORMAL LOW (ref >60)
GFR, EST NON AFRICAN AMERICAN: 8 mL/min — AB (ref >60)
GLUCOSE: 245 mg/dL — AB (ref 65–99)
Potassium: 4.1 mmol/L (ref 3.5–5.1)
Sodium: 138 mmol/L (ref 135–145)
Total Bilirubin: 0.6 mg/dL (ref 0.3–1.2)
Total Protein: 7.5 g/dL (ref 6.5–8.1)

## 2016-08-31 LAB — LIPASE, BLOOD: LIPASE: 22 U/L (ref 11–51)

## 2016-08-31 LAB — CBC WITH DIFFERENTIAL/PLATELET
BASOS ABS: 0 10*3/uL (ref 0.0–0.1)
BASOS PCT: 1 %
EOS PCT: 1 %
Eosinophils Absolute: 0.1 10*3/uL (ref 0.0–0.7)
HEMATOCRIT: 26 % — AB (ref 36.0–46.0)
Hemoglobin: 8.8 g/dL — ABNORMAL LOW (ref 12.0–15.0)
LYMPHS PCT: 23 %
Lymphs Abs: 1.4 10*3/uL (ref 0.7–4.0)
MCH: 30 pg (ref 26.0–34.0)
MCHC: 33.8 g/dL (ref 30.0–36.0)
MCV: 88.7 fL (ref 78.0–100.0)
Monocytes Absolute: 0.4 10*3/uL (ref 0.1–1.0)
Monocytes Relative: 6 %
NEUTROS ABS: 4.2 10*3/uL (ref 1.7–7.7)
Neutrophils Relative %: 69 %
PLATELETS: 315 10*3/uL (ref 150–400)
RBC: 2.93 MIL/uL — AB (ref 3.87–5.11)
RDW: 13.3 % (ref 11.5–15.5)
WBC: 6.1 10*3/uL (ref 4.0–10.5)

## 2016-08-31 MED ORDER — ONDANSETRON HCL 4 MG/2ML IJ SOLN
4.0000 mg | Freq: Once | INTRAMUSCULAR | Status: AC
  Administered 2016-08-31: 4 mg via INTRAVENOUS
  Filled 2016-08-31: qty 2

## 2016-08-31 MED ORDER — HYDRALAZINE HCL 50 MG PO TABS
50.0000 mg | ORAL_TABLET | Freq: Once | ORAL | Status: AC
  Administered 2016-08-31: 50 mg via ORAL
  Filled 2016-08-31: qty 1

## 2016-08-31 MED ORDER — LABETALOL HCL 5 MG/ML IV SOLN
20.0000 mg | Freq: Once | INTRAVENOUS | Status: AC
  Administered 2016-08-31: 20 mg via INTRAVENOUS
  Filled 2016-08-31: qty 4

## 2016-08-31 MED ORDER — SODIUM CHLORIDE 0.9 % IV BOLUS (SEPSIS)
1000.0000 mL | Freq: Once | INTRAVENOUS | Status: AC
  Administered 2016-08-31: 1000 mL via INTRAVENOUS

## 2016-08-31 MED ORDER — LOSARTAN POTASSIUM 50 MG PO TABS
50.0000 mg | ORAL_TABLET | Freq: Every day | ORAL | Status: DC
  Administered 2016-08-31: 50 mg via ORAL
  Filled 2016-08-31: qty 1

## 2016-08-31 MED ORDER — HALOPERIDOL LACTATE 5 MG/ML IJ SOLN
5.0000 mg | Freq: Once | INTRAMUSCULAR | Status: AC
  Administered 2016-08-31: 5 mg via INTRAVENOUS
  Filled 2016-08-31: qty 1

## 2016-08-31 MED ORDER — PROMETHAZINE HCL 25 MG PO TABS: 25.0000 mg | ORAL_TABLET | Freq: Four times a day (QID) | ORAL | 0 refills | Status: DC | PRN

## 2016-08-31 NOTE — ED Notes (Signed)
Pt drank 185ml of water w/o emesis

## 2016-08-31 NOTE — ED Provider Notes (Signed)
[facility] DEPT Provider Note   CSN: [id number] Arrival date & time: 08/31/16  0859     History   Chief Complaint Chief Complaint  Patient presents with  . Emesis  . Nausea    HPI GERILYN [name] is a 36 y.o. female.  HPI   36 year old female with history of diabetes, diabetic neuropathy, gastroparesis, HTN, kidney disease presenting for evaluation of nausea and vomiting. Patient report for the past 2 weeks she has had progressive worsening nausea and vomiting. Symptoms seems to increase within the past 4 days. She is unable to keep anything down including both liquid and solid. She was seen in the ED 2 days ago for same, did receive IV fluid and antinausea medication and subsequently discharged home. She does have an appointment to be seen by the core gastroenterologist tomorrow however she is here due to progressiveness of her symptoms. Patient states she vomits 4-5 times of nonbloody nonbilious contents daily, she has decreased bowel movement due to not able to keep anything down. She feels that she is dehydrated. She denies any associated pain, no fever, chills, URI symptoms, back pain, dysuria, or rash. She is unable to take her diabetic medication. She is also trying to establish care in [place]. She used to be in [place].    Past Medical History:  Diagnosis Date  . Diabetes mellitus without complication ([place])   . Diabetic neuropathy ([place])   . Gastroparesis   . HA (headache)   . Hypertension   . Neuropathy   . Renal disorder   . Trigeminal neuralgia     Patient Active Problem List   Diagnosis Date Noted  . Nausea with vomiting 04/04/2015  . AKI (acute kidney injury) ([place]) 04/04/2015  . DKA (diabetic ketoacidoses) ([place]) 04/03/2015  . Trigeminal neuralgia 12/12/2013  . HA (headache)   . DKA, type 2 ([place]) 10/24/2012  . Contact dermatitis 10/24/2012  . SORE THROAT 03/22/2010  . 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 FATIGUE 05/25/2009  . VAGINAL DISCHARGE 01/28/2009    . Carbuncle and furuncle of unspecified site 12/29/2008  . HYPERCHOLESTEROLEMIA 06/12/2008  . MICROALBUMINURIA 06/12/2008  . MONILIAL VAGINITIS 11/14/2007  . INGUINAL PAIN, BILATERAL 11/14/2007  . DYSURIA 10/17/2007  . CYSTITIS 09/05/2007  . DELAYED MENSES 09/05/2007  . RHINOCONJUNCTIVITIS, ALLERGIC 07/19/2007  . DENTAL PAIN 03/28/2007  . Uncontrolled type 1 diabetes mellitus ([place]) 09/01/2006  . HYPERLIPIDEMIA 09/01/2006  . PELVIC INFLAMMATORY DISEASE 09/01/2006  . ABORTION, SPONTANEOUS 09/01/2006  . ABSCESS 09/01/2006  . HX, PERSONAL, PAST NONCOMPLIANCE 09/01/2006    Past Surgical History:  Procedure Laterality Date  . EYE SURGERY    . None      OB History    No data available       Home Medications    Prior to Admission medications   Medication Sig Start Date End Date Taking? Authorizing Provider  carbamazepine (TEGRETOL) 200 MG tablet Take 200 mg by mouth every evening.     [provider]  DULoxetine (CYMBALTA) 30 MG capsule Take 30 mg by mouth daily. 05/07/15   [provider]  gabapentin (NEURONTIN) 300 MG capsule Take 300 mg by mouth 2 (two) times daily.     [provider]  hydrALAZINE (APRESOLINE) 50 MG tablet Take 50 mg by mouth 3 (three) times daily. 08/25/16   [provider]  Insulin Glargine (LANTUS SOLOSTAR) 100 UNIT/ML Solostar Pen Inject 50 Units into the skin every morning. And pen needles 1/day Patient taking differently: Inject 30 Units into the  skin every morning. And pen needles 1/day 06/08/16   Renato Shin, MD  labetalol (NORMODYNE) 100 MG tablet Take 100 mg by mouth 2 (two) times daily.    [provider]  labetalol (NORMODYNE) 300 MG tablet Take 300 mg by mouth 3 (three) times daily. 08/25/16   [provider]  losartan (COZAAR) 50 MG tablet Take 50 mg by mouth daily. 05/07/15   [provider]  ondansetron (ZOFRAN) 4 MG tablet Take 1 tablet (4 mg total) by mouth every 8 (eight) hours as  needed for nausea or vomiting. Patient taking differently: Take 4 mg by mouth 4 (four) times daily as needed for nausea or vomiting.  04/04/15   Geradine Girt, DO  sevelamer carbonate (RENVELA) 800 MG tablet Take 800 mg by mouth 3 (three) times daily with meals.    [provider]  torsemide (DEMADEX) 20 MG tablet Take 20 mg by mouth every evening.    [provider]    Family History Family History  Problem Relation Age of Onset  . Hyperlipidemia Mother   . Asthma Daughter   . Diabetes Maternal Grandmother   . Kidney disease Maternal Grandfather   . Kidney disease Paternal Grandmother     Social History Social History  Substance Use Topics  . Smoking status: Never Smoker  . Smokeless tobacco: Never Used  . Alcohol use No     Comment: former use     Allergies   Patient has no known allergies.   Review of Systems Review of Systems  All other systems reviewed and are negative.    Physical Exam Updated Vital Signs BP (!) 211/127 (BP Location: Left Arm)   Pulse (!) 106   Temp 98.2 F (36.8 C) (Oral)   Resp 16   LMP 07/17/2016 (Within Weeks)   SpO2 100%   Physical Exam  Constitutional: She appears well-developed and well-nourished. No distress.  Patient appears uncomfortable but nontoxic in appearance  HENT:  Head: Atraumatic.  Mouth is dry  Eyes: Conjunctivae are normal.  Neck: Neck supple.  Cardiovascular:  Tachycardia without murmurs rubs or gallops  Pulmonary/Chest: Effort normal and breath sounds normal.  Abdominal: Soft. Bowel sounds are normal. She exhibits no distension. There is tenderness (Mild generalized tenderness without guarding or rebound tenderness. No focal point tenderness.).  Neurological: She is alert.  Skin: No rash noted.  Psychiatric: She has a normal mood and affect.  Nursing note and vitals reviewed.    ED Treatments / Results  Labs (all labs ordered are listed, but only abnormal results are displayed) Labs  Reviewed  CBC WITH DIFFERENTIAL/PLATELET - Abnormal; Notable for the following:       Result Value   RBC 2.93 (*)    Hemoglobin 8.8 (*)    HCT 26.0 (*)    All other components within normal limits  COMPREHENSIVE METABOLIC PANEL - Abnormal; Notable for the following:    Glucose, Bld 245 (*)    BUN 44 (*)    Creatinine, Ser 6.46 (*)    GFR calc non Af Amer 8 (*)    GFR calc Af Amer 9 (*)    All other components within normal limits  URINALYSIS, ROUTINE W REFLEX MICROSCOPIC - Abnormal; Notable for the following:    Color, Urine STRAW (*)    Glucose, UA >=500 (*)    Hgb urine dipstick SMALL (*)    Protein, ur >=300 (*)    Squamous Epithelial / LPF 0-5 (*)  All other components within normal limits  LIPASE, BLOOD    EKG  EKG Interpretation None       Radiology No results found.  Procedures Procedures (including critical care time)  Medications Ordered in ED Medications  sodium chloride 0.9 % bolus 1,000 mL (0 mLs Intravenous Stopped 08/31/16 1422)  ondansetron (ZOFRAN) injection 4 mg (4 mg Intravenous Given 08/31/16 1027)  labetalol (NORMODYNE,TRANDATE) injection 20 mg (20 mg Intravenous Given 08/31/16 1027)  haloperidol lactate (HALDOL) injection 5 mg (5 mg Intravenous Given 08/31/16 1146)     Initial Impression / Assessment and Plan / ED Course  I have reviewed the triage vital signs and the nursing notes.  Pertinent labs & imaging results that were available during my care of the patient were reviewed by me and considered in my medical decision making (see chart for details).     BP (!) 199/129   Pulse 95   Temp 98.2 F (36.8 C) (Oral)   Resp 17   LMP 07/17/2016 (Within Weeks)   SpO2 98%    Final Clinical Impressions(s) / ED Diagnoses   Final diagnoses:  Gastroparesis  Chronic kidney disease, unspecified CKD stage  Asymptomatic hypertension    New Prescriptions New Prescriptions   PROMETHAZINE (PHENERGAN) 25 MG TABLET    Take 1 tablet (25 mg total)  by mouth every 6 (six) hours as needed for nausea or vomiting.   10:06 AM Patient with history of diabetic gastroparesis here with progressive worsening nausea and vomiting. She appears uncomfortable, is tachycardic, and hypertensive with a an initial blood pressure of 211/127. She has a benign abdominal exam with only mild generalized tenderness. She will benefit IV fluid, antinausea medication, and likely admission for further management of her progressive worsening symptoms. She was seen here 2 days ago for same. At which time, her renal function is moderately abnormal, creatinine of 6. This is likely chronic but the last documented creatinine was normal range a year ago.  2:31 PM Patient is able to tolerates by mouth. She does have a documented elevated temperature likely not able to keep medication down. Her UA shows no signs of year tract infection, normal WBC, hemoglobin of 8.8, similar to baseline, evidence of renal insufficiency with creatinine of 6.46 and change. Normal lipase. She does report improvement of symptoms with current treatment. Since patient does have a GI follow-up tomorrow and she feels better, she is stable for discharge. She understands to return if worsening   Domenic Moras, PA-C 08/31/16 1433    Long, Wonda [facility], MD 09/01/16 [phone]

## 2016-08-31 NOTE — Discharge Instructions (Signed)
Please follow up with your GI specialist tomorrow for further management of your condition.  Take Phenergan as needed for nausea.  Your blood pressure is high, please take your medications.  Return to the ER if your condition worsen or if you have other concerns.

## 2016-08-31 NOTE — ED Notes (Signed)
Patient knows of the need for urine

## 2016-08-31 NOTE — ED Triage Notes (Signed)
Pt c/o nausea x several weeks, started vomiting 2 days ago. Was seen in ED 2 days ago and patient states she has not been able to take any meds due to nausea and vomiting. Pt c/o abdominal soreness with vomiting.

## 2016-09-04 ENCOUNTER — Inpatient Hospital Stay (HOSPITAL_COMMUNITY)
Admission: EM | Admit: 2016-09-04 | Discharge: 2016-09-12 | DRG: 981 | Disposition: A | Discharge status: Home / Self Care | Payer: BLUE CROSS/BLUE SHIELD | Attending: Nephrology | Admitting: Nephrology

## 2016-09-04 ENCOUNTER — Emergency Department (HOSPITAL_COMMUNITY): Payer: BLUE CROSS/BLUE SHIELD

## 2016-09-04 ENCOUNTER — Encounter (HOSPITAL_COMMUNITY): Payer: Self-pay | Admitting: Emergency Medicine

## 2016-09-04 DIAGNOSIS — E1065 Type 1 diabetes mellitus with hyperglycemia: Secondary | ICD-10-CM | POA: Diagnosis present

## 2016-09-04 DIAGNOSIS — I120 Hypertensive chronic kidney disease with stage 5 chronic kidney disease or end stage renal disease: Secondary | ICD-10-CM | POA: Diagnosis present

## 2016-09-04 DIAGNOSIS — K3184 Gastroparesis: Secondary | ICD-10-CM

## 2016-09-04 DIAGNOSIS — E1022 Type 1 diabetes mellitus with diabetic chronic kidney disease: Secondary | ICD-10-CM | POA: Diagnosis present

## 2016-09-04 DIAGNOSIS — E785 Hyperlipidemia, unspecified: Secondary | ICD-10-CM | POA: Diagnosis present

## 2016-09-04 DIAGNOSIS — K219 Gastro-esophageal reflux disease without esophagitis: Secondary | ICD-10-CM | POA: Diagnosis present

## 2016-09-04 DIAGNOSIS — E10649 Type 1 diabetes mellitus with hypoglycemia without coma: Secondary | ICD-10-CM | POA: Diagnosis not present

## 2016-09-04 DIAGNOSIS — Z79899 Other long term (current) drug therapy: Secondary | ICD-10-CM | POA: Diagnosis not present

## 2016-09-04 DIAGNOSIS — E1043 Type 1 diabetes mellitus with diabetic autonomic (poly)neuropathy: Principal | ICD-10-CM | POA: Diagnosis present

## 2016-09-04 DIAGNOSIS — Z419 Encounter for procedure for purposes other than remedying health state, unspecified: Secondary | ICD-10-CM

## 2016-09-04 DIAGNOSIS — K5900 Constipation, unspecified: Secondary | ICD-10-CM | POA: Diagnosis present

## 2016-09-04 DIAGNOSIS — Z992 Dependence on renal dialysis: Secondary | ICD-10-CM

## 2016-09-04 DIAGNOSIS — R1111 Vomiting without nausea: Secondary | ICD-10-CM | POA: Diagnosis not present

## 2016-09-04 DIAGNOSIS — N185 Chronic kidney disease, stage 5: Secondary | ICD-10-CM

## 2016-09-04 DIAGNOSIS — G500 Trigeminal neuralgia: Secondary | ICD-10-CM | POA: Diagnosis present

## 2016-09-04 DIAGNOSIS — E1021 Type 1 diabetes mellitus with diabetic nephropathy: Secondary | ICD-10-CM | POA: Diagnosis present

## 2016-09-04 DIAGNOSIS — R112 Nausea with vomiting, unspecified: Secondary | ICD-10-CM

## 2016-09-04 DIAGNOSIS — D631 Anemia in chronic kidney disease: Secondary | ICD-10-CM | POA: Diagnosis present

## 2016-09-04 DIAGNOSIS — Z833 Family history of diabetes mellitus: Secondary | ICD-10-CM

## 2016-09-04 DIAGNOSIS — N186 End stage renal disease: Secondary | ICD-10-CM | POA: Diagnosis present

## 2016-09-04 DIAGNOSIS — Z794 Long term (current) use of insulin: Secondary | ICD-10-CM

## 2016-09-04 DIAGNOSIS — Z95828 Presence of other vascular implants and grafts: Secondary | ICD-10-CM

## 2016-09-04 DIAGNOSIS — E1042 Type 1 diabetes mellitus with diabetic polyneuropathy: Secondary | ICD-10-CM | POA: Diagnosis present

## 2016-09-04 DIAGNOSIS — N189 Chronic kidney disease, unspecified: Secondary | ICD-10-CM

## 2016-09-04 DIAGNOSIS — R1110 Vomiting, unspecified: Secondary | ICD-10-CM | POA: Diagnosis present

## 2016-09-04 LAB — CBC
HCT: 25.7 % — ABNORMAL LOW (ref 36.0–46.0)
HEMATOCRIT: 26.7 % — AB (ref 36.0–46.0)
HEMOGLOBIN: 8.4 g/dL — AB (ref 12.0–15.0)
Hemoglobin: 8.9 g/dL — ABNORMAL LOW (ref 12.0–15.0)
MCH: 29.6 pg (ref 26.0–34.0)
MCH: 29.1 pg (ref 26.0–34.0)
MCHC: 33.3 g/dL (ref 30.0–36.0)
MCHC: 32.7 g/dL (ref 30.0–36.0)
MCV: 88.7 fL (ref 78.0–100.0)
MCV: 88.9 fL (ref 78.0–100.0)
PLATELETS: 281 10*3/uL (ref 150–400)
Platelets: 315 10*3/uL (ref 150–400)
RBC: 3.01 MIL/uL — ABNORMAL LOW (ref 3.87–5.11)
RBC: 2.89 MIL/uL — AB (ref 3.87–5.11)
RDW: 13.4 % (ref 11.5–15.5)
RDW: 13.2 % (ref 11.5–15.5)
WBC: 7.2 10*3/uL (ref 4.0–10.5)
WBC: 6.3 10*3/uL (ref 4.0–10.5)

## 2016-09-04 LAB — COMPREHENSIVE METABOLIC PANEL
ALBUMIN: 3.9 g/dL (ref 3.5–5.0)
ALT: 19 U/L (ref 14–54)
AST: 20 U/L (ref 15–41)
Alkaline Phosphatase: 69 U/L (ref 38–126)
Anion gap: 11 (ref 5–15)
BILIRUBIN TOTAL: 0.4 mg/dL (ref 0.3–1.2)
BUN: 48 mg/dL — AB (ref 6–20)
CHLORIDE: 101 mmol/L (ref 101–111)
CO2: 23 mmol/L (ref 22–32)
CREATININE: 6.92 mg/dL — AB (ref 0.44–1.00)
Calcium: 9.2 mg/dL (ref 8.9–10.3)
GFR calc Af Amer: 8 mL/min — ABNORMAL LOW (ref >60)
GFR, EST NON AFRICAN AMERICAN: 7 mL/min — AB (ref >60)
Glucose, Bld: 302 mg/dL — ABNORMAL HIGH (ref 65–99)
POTASSIUM: 4.7 mmol/L (ref 3.5–5.1)
Sodium: 135 mmol/L (ref 135–145)
Total Protein: 7.2 g/dL (ref 6.5–8.1)

## 2016-09-04 LAB — URINALYSIS, ROUTINE W REFLEX MICROSCOPIC
BACTERIA UA: NONE SEEN
Bilirubin Urine: NEGATIVE
Glucose, UA: >=500 mg/dL — AB
HGB URINE DIPSTICK: NEGATIVE
Ketones, ur: NEGATIVE mg/dL
LEUKOCYTES UA: NEGATIVE
Nitrite: NEGATIVE
SPECIFIC GRAVITY, URINE: 1.014 (ref 1.005–1.030)
pH: 6 (ref 5.0–8.0)

## 2016-09-04 LAB — POC URINE PREG, ED: PREG TEST UR: NEGATIVE

## 2016-09-04 LAB — GLUCOSE, CAPILLARY: Glucose-Capillary: 171 mg/dL — ABNORMAL HIGH (ref 65–99)

## 2016-09-04 LAB — CREATININE, SERUM
CREATININE: 6.75 mg/dL — AB (ref 0.44–1.00)
GFR, EST AFRICAN AMERICAN: 8 mL/min — AB (ref >60)
GFR, EST NON AFRICAN AMERICAN: 7 mL/min — AB (ref >60)

## 2016-09-04 LAB — CBG MONITORING, ED
GLUCOSE-CAPILLARY: 300 mg/dL — AB (ref 65–99)
Glucose-Capillary: 188 mg/dL — ABNORMAL HIGH (ref 65–99)

## 2016-09-04 LAB — LIPASE, BLOOD: Lipase: 24 U/L (ref 11–51)

## 2016-09-04 MED ORDER — PANTOPRAZOLE SODIUM 40 MG PO TBEC
40.0000 mg | DELAYED_RELEASE_TABLET | Freq: Every day | ORAL | Status: DC
  Administered 2016-09-05 – 2016-09-12 (×9): 40 mg via ORAL
  Filled 2016-09-04 (×9): qty 1

## 2016-09-04 MED ORDER — LACTULOSE 10 GM/15ML PO SOLN
30.0000 g | Freq: Once | ORAL | Status: AC
  Administered 2016-09-05: 30 g via ORAL
  Filled 2016-09-04: qty 45

## 2016-09-04 MED ORDER — HYDRALAZINE HCL 20 MG/ML IJ SOLN: 10.0000 mg | Freq: Four times a day (QID) | INTRAMUSCULAR | Status: DC | PRN

## 2016-09-04 MED ORDER — POLYETHYLENE GLYCOL 3350 17 G PO PACK
17.0000 g | PACK | Freq: Every day | ORAL | Status: DC
  Administered 2016-09-05 – 2016-09-11 (×5): 17 g via ORAL
  Filled 2016-09-04 (×7): qty 1

## 2016-09-04 MED ORDER — PROMETHAZINE HCL 25 MG/ML IJ SOLN
25.0000 mg | Freq: Once | INTRAMUSCULAR | Status: AC
  Administered 2016-09-04: 25 mg via INTRAVENOUS
  Filled 2016-09-04: qty 1

## 2016-09-04 MED ORDER — SODIUM CHLORIDE 0.9 % IV SOLN
INTRAVENOUS | Status: AC
  Administered 2016-09-04 – 2016-09-05 (×2): via INTRAVENOUS

## 2016-09-04 MED ORDER — CARBAMAZEPINE 200 MG PO TABS
200.0000 mg | ORAL_TABLET | Freq: Every evening | ORAL | Status: DC
  Administered 2016-09-05 – 2016-09-11 (×8): 200 mg via ORAL
  Filled 2016-09-04 (×8): qty 1

## 2016-09-04 MED ORDER — INSULIN ASPART 100 UNIT/ML ~~LOC~~ SOLN
0.0000 [IU] | Freq: Three times a day (TID) | SUBCUTANEOUS | Status: DC
  Administered 2016-09-05: 3 [IU] via SUBCUTANEOUS
  Administered 2016-09-05 – 2016-09-06 (×3): 2 [IU] via SUBCUTANEOUS
  Administered 2016-09-06: 1 [IU] via SUBCUTANEOUS
  Administered 2016-09-07 – 2016-09-08 (×3): 2 [IU] via SUBCUTANEOUS
  Administered 2016-09-08 – 2016-09-09 (×2): 1 [IU] via SUBCUTANEOUS
  Administered 2016-09-09: 3 [IU] via SUBCUTANEOUS
  Administered 2016-09-10 (×2): 1 [IU] via SUBCUTANEOUS
  Administered 2016-09-10 – 2016-09-11 (×2): 2 [IU] via SUBCUTANEOUS
  Administered 2016-09-11: 1 [IU] via SUBCUTANEOUS
  Administered 2016-09-11 – 2016-09-12 (×3): 2 [IU] via SUBCUTANEOUS

## 2016-09-04 MED ORDER — LABETALOL HCL 200 MG PO TABS
400.0000 mg | ORAL_TABLET | Freq: Two times a day (BID) | ORAL | Status: DC
  Administered 2016-09-05 – 2016-09-12 (×14): 400 mg via ORAL
  Filled 2016-09-04 (×14): qty 2

## 2016-09-04 MED ORDER — ACETAMINOPHEN 650 MG RE SUPP: 650.0000 mg | Freq: Four times a day (QID) | RECTAL | Status: DC | PRN

## 2016-09-04 MED ORDER — ONDANSETRON HCL 4 MG/2ML IJ SOLN
4.0000 mg | Freq: Three times a day (TID) | INTRAMUSCULAR | Status: DC
  Administered 2016-09-05 – 2016-09-12 (×23): 4 mg via INTRAVENOUS
  Filled 2016-09-04 (×20): qty 2

## 2016-09-04 MED ORDER — SODIUM CHLORIDE 0.9 % IV BOLUS (SEPSIS)
1000.0000 mL | Freq: Once | INTRAVENOUS | Status: AC
  Administered 2016-09-04: 1000 mL via INTRAVENOUS

## 2016-09-04 MED ORDER — SEVELAMER CARBONATE 800 MG PO TABS
800.0000 mg | ORAL_TABLET | Freq: Three times a day (TID) | ORAL | Status: DC
  Administered 2016-09-05 – 2016-09-12 (×21): 800 mg via ORAL
  Filled 2016-09-04 (×21): qty 1

## 2016-09-04 MED ORDER — HALOPERIDOL LACTATE 5 MG/ML IJ SOLN
5.0000 mg | Freq: Once | INTRAMUSCULAR | Status: AC
  Administered 2016-09-04: 5 mg via INTRAVENOUS
  Filled 2016-09-04: qty 1

## 2016-09-04 MED ORDER — ACETAMINOPHEN 325 MG PO TABS
650.0000 mg | ORAL_TABLET | Freq: Four times a day (QID) | ORAL | Status: DC | PRN
  Administered 2016-09-05 – 2016-09-07 (×2): 650 mg via ORAL
  Filled 2016-09-04 (×2): qty 2

## 2016-09-04 MED ORDER — HYDRALAZINE HCL 50 MG PO TABS
50.0000 mg | ORAL_TABLET | Freq: Three times a day (TID) | ORAL | Status: DC
  Administered 2016-09-05 – 2016-09-11 (×17): 50 mg via ORAL
  Filled 2016-09-04 (×18): qty 1

## 2016-09-04 MED ORDER — SENNA 8.6 MG PO TABS
2.0000 | ORAL_TABLET | Freq: Every day | ORAL | Status: DC
  Administered 2016-09-05: 17.2 mg via ORAL
  Filled 2016-09-04: qty 2

## 2016-09-04 MED ORDER — INSULIN GLARGINE 100 UNIT/ML ~~LOC~~ SOLN
30.0000 [IU] | Freq: Every morning | SUBCUTANEOUS | Status: DC
  Administered 2016-09-05 – 2016-09-06 (×2): 30 [IU] via SUBCUTANEOUS
  Filled 2016-09-04 (×4): qty 0.3

## 2016-09-04 MED ORDER — HEPARIN SODIUM (PORCINE) 5000 UNIT/ML IJ SOLN
5000.0000 [IU] | Freq: Three times a day (TID) | INTRAMUSCULAR | Status: DC
  Administered 2016-09-05 – 2016-09-06 (×6): 5000 [IU] via SUBCUTANEOUS
  Filled 2016-09-04 (×6): qty 1

## 2016-09-04 MED ORDER — PROMETHAZINE HCL 25 MG/ML IJ SOLN
25.0000 mg | Freq: Four times a day (QID) | INTRAMUSCULAR | Status: DC | PRN
  Administered 2016-09-05 – 2016-09-11 (×9): 25 mg via INTRAVENOUS
  Filled 2016-09-04 (×9): qty 1

## 2016-09-04 MED ORDER — MORPHINE SULFATE (PF) 4 MG/ML IV SOLN
4.0000 mg | Freq: Once | INTRAVENOUS | Status: AC
  Administered 2016-09-04: 4 mg via INTRAVENOUS
  Filled 2016-09-04: qty 1

## 2016-09-04 MED ORDER — ALBUTEROL SULFATE (2.5 MG/3ML) 0.083% IN NEBU: 2.5000 mg | INHALATION_SOLUTION | RESPIRATORY_TRACT | Status: DC | PRN

## 2016-09-04 NOTE — ED Notes (Signed)
[facility] arrived and report given to [facility]

## 2016-09-04 NOTE — ED Notes (Signed)
[facility] called. 

## 2016-09-04 NOTE — H&P (Addendum)
HISTORY AND PHYSICAL       PATIENT DETAILS Name: Carrie [name] Age: 36 y.o. Sex: female Date of Birth: 1981-04-07 Admit Date: 09/04/2016 JSE:GBTDVVO, Earlie Server, MD   Patient coming from: Home   CHIEF COMPLAINT:  Vomiting on and off for the past 1 month  HPI: Carrie [name] is a 36 y.o. female with medical history significant of stage V chronic kidney disease, type 1 diabetes, trigeminal neuralgia, peripheral neuropathy who presented to the hospital (third visit this month) for evaluation of the above-noted complaints. Per patient, she has a known history of gastroparesis-and was maintained on Reglan for approximately 1 year. Approximately 2 months back, Reglan was discontinued by her primary nephrologist and primary care practitioner due to concerns of early tardive dyskinesia. She then started having intermittent nausea and vomiting, that has gradually worsened. She's had multiple ED visits where she is given supportive care and IV antiemetics, but once she goes home she continues to have vomiting. During this same time, her kidney function has rapidly worsened. She was apparently hospitalized in [facility] earlier this year where she was found to have a creatinine approximately around of 2.5, since then her creatinine has gradually worsened. She is being followed by a nephrologist in [facility], and has had one visit with a transplant nephrologist in [place]. She is in the process of transferring her renal care over to [place] [facility], and has a appointment this coming Friday, she has been told by her nephrologist that she probably will require dialysis in the new future, however no axes has yet been placed.  Per patient, however vomiting episodes are mostly related to food-and she has tried multiple agents including Zofran, Phenergan, Compazine without any relief. She does acknowledge some amount of constipation-claims she's had "small bowel movements"and  that her last "big" bowel movement was this past weekend.  She denies any abdominal pain. There is no history of fever. There is no history of chest pain or shortness of breath   ED Course:  In the emergency room, patient was found to have a creatinine of 6.9, she was given Phenergan, and 1 L fluid bolus. She was subsequently referred to the hospitalist service for further evaluation and treatment  Note: Lives at: Home Mobility:  Independen Chronic Indwelling Foley:no   REVIEW OF SYSTEMS:  Constitutional:   No  weight loss, night sweats,  Fevers, chills, fatigue.  HEENT:    No headaches, Dysphagia,Tooth/dental problems,Sore throat,  No sneezing, itching, ear ache, nasal congestion, post nasal drip  Cardio-vascular: No chest pain,Orthopnea, PND  GI:  No heartburn, indigestion, abdominal pain,diarrhea, melena or hematochezia  Resp: No shortness of breath, cough, hemoptysis,plueritic chest pain.   Skin:  No rash or lesions.  GU:  No dysuria, change in color of urine, no urgency or frequency.    Musculoskeletal: No joint pain or swelling.   Endocrine: No heat intolerance, no cold intolerance, no polyuria, no polydipsia  Psych: No change in mood or affect. No depression or anxiety.  No memory loss.   ALLERGIES:  No Known Allergies  PAST MEDICAL HISTORY: Past Medical History:  Diagnosis Date  . Diabetes mellitus without complication ([place])   . Diabetic neuropathy ([place])   . Gastroparesis   . HA (headache)   . Hypertension   . Neuropathy   . Renal disorder   . Trigeminal neuralgia     PAST SURGICAL HISTORY: Past Surgical History:  Procedure Laterality Date  .  EYE SURGERY    . None      MEDICATIONS AT HOME: Prior to Admission medications   Medication Sig Start Date End Date Taking? Authorizing Provider  acetaminophen (TYLENOL) 325 MG tablet Take 650 mg by mouth every 6 (six) hours as needed for mild pain.   Yes [provider]  carbamazepine  (TEGRETOL) 200 MG tablet Take 200 mg by mouth every evening.    Yes [provider]  ciprofloxacin (CIPRO) 500 MG tablet Take 500 mg by mouth 2 (two) times daily. 09/03/16  Yes [provider]  gabapentin (NEURONTIN) 300 MG capsule Take 300 mg by mouth 2 (two) times daily.    Yes [provider]  hydrALAZINE (APRESOLINE) 50 MG tablet Take 50 mg by mouth 3 (three) times daily. 08/25/16  Yes [provider]  Insulin Glargine (LANTUS SOLOSTAR) 100 UNIT/ML Solostar Pen Inject 50 Units into the skin every morning. And pen needles 1/day Patient taking differently: Inject 30 Units into the skin every morning. And pen needles 1/day 06/08/16  Yes Renato Shin, MD  labetalol (NORMODYNE) 300 MG tablet Take 300 mg by mouth 3 (three) times daily. 08/25/16  Yes [provider]  lactulose (CHRONULAC) 10 GM/15ML solution Take 10 g by mouth 3 (three) times daily. 09/03/16  Yes [provider]  metoCLOPramide (REGLAN) 10 MG tablet Take 10 mg by mouth daily. 09/03/16  Yes [provider]  metroNIDAZOLE (FLAGYL) 500 MG tablet Take 500 mg by mouth 3 (three) times daily. 09/03/16  Yes [provider]  sevelamer carbonate (RENVELA) 800 MG tablet Take 800 mg by mouth 3 (three) times daily with meals.   Yes [provider]  torsemide (DEMADEX) 20 MG tablet Take 20 mg by mouth every evening.   Yes [provider]  ondansetron (ZOFRAN) 4 MG tablet Take 1 tablet (4 mg total) by mouth every 8 (eight) hours as needed for nausea or vomiting. Patient not taking: Reported on 09/04/2016 04/04/15   Geradine Girt, DO  promethazine (PHENERGAN) 25 MG tablet Take 1 tablet (25 mg total) by mouth every 6 (six) hours as needed for nausea or vomiting. Patient not taking: Reported on 09/04/2016 08/31/16   Domenic Moras, PA-C    FAMILY HISTORY: Family History  Problem Relation Age of Onset  . Hyperlipidemia Mother   . Asthma Daughter   . Diabetes Maternal  Grandmother   . Kidney disease Maternal Grandfather   . Kidney disease Paternal Grandmother    SOCIAL HISTORY:  reports that she has never smoked. She has never used smokeless tobacco. She reports that she does not drink alcohol or use drugs.  PHYSICAL EXAM: Blood pressure (!) 186/99, pulse 82, temperature 99 F (37.2 C), temperature source Oral, resp. rate 18, height 5\' 8"  (1.727 m), weight 85.3 kg (188 lb), last menstrual period 07/16/2016, SpO2 100 %.  General appearance :Awake, alert, not in any distress. Speech Clear. Not toxic Looking Eyes:, pupils equally reactive to light and accomodation,no scleral icterus.Pink conjunctiva HEENT: Atraumatic and Normocephalic Neck: supple, no JVD. No cervical lymphadenopathy. No thyromegaly Resp:Good air entry bilaterally, no added sounds  CVS: S1 S2 regular, no murmurs.  GI: Bowel sounds present, Non tender and not distended with no gaurding, rigidity or rebound.No organomegaly Extremities: B/L Lower Ext shows Trace edema, both legs are warm to touch Neurology:  speech clear,Non focal, sensation is grossly intact. Psychiatric: Normal judgment and insight. Alert and oriented x 3. Normal mood. Musculoskeletal:No digital cyanosis Skin:No Rash, warm and dry  Wounds:N/A  LABS ON ADMISSION:  I have personally reviewed following labs and imaging studies  CBC:  Recent Labs Lab 08/29/16 1810 08/31/16 1031 09/04/16 1243  WBC 5.2 6.1 7.2  NEUTROABS  --  4.2  --   HGB 8.3* 8.8* 8.9*  HCT 24.8* 26.0* 26.7*  MCV 89.2 88.7 88.7  PLT 295 315 784    Basic Metabolic Panel:  Recent Labs Lab 08/29/16 1810 08/31/16 1031 09/04/16 1243  NA 133* 138 135  K 4.4 4.1 4.7  CL 102 103 101  CO2 23 25 23   GLUCOSE 276* 245* 302*  BUN 48* 44* 48*  CREATININE 6.46* 6.46* 6.92*  CALCIUM 8.7* 9.3 9.2    GFR: Estimated Creatinine Clearance: 13.2 mL/min (A) (by C-G formula based on SCr of 6.92 mg/dL (H)).  Liver Function Tests:  Recent Labs Lab  08/31/16 1031 09/04/16 1243  AST 26 20  ALT 20 19  ALKPHOS 69 69  BILITOT 0.6 0.4  PROT 7.5 7.2  ALBUMIN 3.8 3.9    Recent Labs Lab 08/31/16 1031 09/04/16 1243  LIPASE 22 24   No results for input(s): AMMONIA in the last 168 hours.  Coagulation Profile: No results for input(s): INR, PROTIME in the last 168 hours.  Cardiac Enzymes: No results for input(s): CKTOTAL, CKMB, CKMBINDEX, TROPONINI in the last 168 hours.  BNP (last 3 results) No results for input(s): PROBNP in the last 8760 hours.  HbA1C: No results for input(s): HGBA1C in the last 72 hours.  CBG:  Recent Labs Lab 08/29/16 1800 09/04/16 1252  GLUCAP 266* 300*    Lipid Profile: No results for input(s): CHOL, HDL, LDLCALC, TRIG, CHOLHDL, LDLDIRECT in the last 72 hours.  Thyroid Function Tests: No results for input(s): TSH, T4TOTAL, FREET4, T3FREE, THYROIDAB in the last 72 hours.  Anemia Panel: No results for input(s): VITAMINB12, FOLATE, FERRITIN, TIBC, IRON, RETICCTPCT in the last 72 hours.  Urine analysis:    Component Value Date/Time   COLORURINE YELLOW 09/04/2016 1301   APPEARANCEUR HAZY (A) 09/04/2016 1301   LABSPEC 1.014 09/04/2016 1301   PHURINE 6.0 09/04/2016 1301   GLUCOSEU >=500 (A) 09/04/2016 1301   HGBUR NEGATIVE 09/04/2016 1301   HGBUR large 05/25/2009 0900   BILIRUBINUR NEGATIVE 09/04/2016 1301   BILIRUBINUR neg 10/23/2012 1810   KETONESUR NEGATIVE 09/04/2016 1301   PROTEINUR >=300 (A) 09/04/2016 1301   UROBILINOGEN 0.2 04/16/2013 2155   NITRITE NEGATIVE 09/04/2016 1301   LEUKOCYTESUR NEGATIVE 09/04/2016 1301    Sepsis Labs: Lactic Acid, Venous    Component Value Date/Time   LATICACIDVEN 1.52 04/12/2014 1832     Microbiology: No results found for this or any previous visit (from the past 240 hour(s)).    RADIOLOGIC STUDIES ON ADMISSION: Ct Abdomen Pelvis Wo Contrast  Result Date: 09/04/2016 CLINICAL DATA:  LEFT lower quadrant pain for 2-4 weeks EXAM: CT ABDOMEN  AND PELVIS WITHOUT CONTRAST TECHNIQUE: Multidetector CT imaging of the abdomen and pelvis was performed following the standard protocol without IV contrast. COMPARISON:  None. FINDINGS: Lower chest: Lung bases are clear. Hepatobiliary: No focal hepatic lesion. No biliary duct dilatation. Gallbladder is normal. Common bile duct is normal. Pancreas: Pancreas is normal. No ductal dilatation. No pancreatic inflammation. Spleen: Normal spleen Adrenals/urinary tract: Adrenal glands and kidneys are normal. No ureterolithiasis or obstructive uropathy. Bladder normal Stomach/Bowel: Stomach, small-bowel appendix and cecum normal. There is moderate volume stool throughout the colon. Descending colon and rectosigmoid colon normal. Vascular/Lymphatic: Abdominal aorta is normal caliber. There is no retroperitoneal  or periportal lymphadenopathy. No pelvic lymphadenopathy. Reproductive: Uterus and ribs normal. Other: The inguinal hernia. Small pleural hernias fat filled measuring 15 mm by 19 mm pre Musculoskeletal: No aggressive osseous lesion. IMPRESSION: 1. No explanation for LEFT lower quadrant pain. 2. No inguinal hernia.  Small umbilical hernia. 3. No nephrolithiasis or obstructive uropathy. Electronically Signed   By: Suzy Bouchard M.D.   On: 09/04/2016 14:57    I have personally reviewed images of chest xray or the CT abdomen   EKG:  Personally reviewed. Normal sinus rhythm  ASSESSMENT AND PLAN: Intractable vomiting: I suspect that this is probably secondary to underlying gastroparesis-she no longer is on Reglan because of concern of tardive dyskinesia. However her kidney function has rapidly deteriorated since this past January (creatinine from 2.5 to 6.96), if her vomiting still persists in spite of numerous antiemetics, may need to consider uremia in the differential diagnoses. For now we will place on scheduled IV Zofran, and use IV Phenergan as needed. She also appears to have some amount of constipation  that was seen on CT of the abdomen-we will give her 1 dose of lactulose, and then place on scheduled Senokot and MiraLAX. Clinical course will need to be followed closely  Chronic kidney disease stage V: Although she has probable underlying diabetic nephropathy-she has progressed from a creatinine of 2.5 around January of this year to a creatinine of 6.96. Not sure if all of this can be attributed to underlying diabetic nephropathy, not sure of there is any acute component from dehydration due to vomiting-volume status looks stable to me. I will put on gentle hydration to see if there is any improvement in her creatinine, case was discussed with Dr. Jamal Maes over the phone-given concern for uremia causing vomiting, patient will be transferred to [facility] for nephrology evaluation on 5/22.  Type 1 diabetes: She apparently is taking 30 units of Lantus-this is being continued-we'll start SSRI, CBGs will need to be followed closely and adjusted accordingly.  Uncontrolled hypertension: Probably secondary to inability to take antihypertensives in the setting of vomiting-to resume usual antihypertensives-follow BP trend and adjust accordingly. He was IV hydralazine as needed.  History of trigeminal neuralgia: Continue Tegretol  History of peripheral neuropathy: Hold Neurontin for now-if renal function improves-may need further adjustment in dosing at some point.  Anemia: Likely secondary to anemia of renal disease-check anemia panel. May need dosing of IV iron and darbepoetin accordingly.  Further plan will depend as patient's clinical course evolves and further radiologic and laboratory data become available. Patient will be monitored closely.  Above noted plan was discussed with patient face to face at bedside, she was in agreement.   CONSULTS: None  DVT Prophylaxis: Prophylactic Heparin  Code Status: Full Code  Disposition Plan:  Discharge back home  possibly in 2-3 days,  depending on clinical course  Admission status: Inpatient  going to tele  The medical decision making on this patient was of high complexity and the patient is at high risk for clinical deterioration, therefore this is a level 3 visit.  Total time spent  55 minutes.Greater than 50% of this time was spent in counseling, explanation of diagnosis, planning of further management, and coordination of care.  Oren Binet [facility] Pager [phone]  If 7PM-7AM, please contact night-coverage [email or web address] Password [facility] 09/04/2016, 4:44 PM

## 2016-09-04 NOTE — ED Notes (Signed)
[facility] left with patient

## 2016-09-04 NOTE — ED Triage Notes (Addendum)
Patient reports LLQ abdominal pain and vomiting x 2 weeks.  Reports that she has not taken her insulin due to not being able to eat or drink.  States that she did not want her blood sugar to drop.  Additionally reports 15lb weight loss over the past 2 weeks.

## 2016-09-04 NOTE — ED Provider Notes (Signed)
[facility] DEPT Provider Note   CSN: [id number] Arrival date & time: 09/04/16  1159     History   Chief Complaint Chief Complaint  Patient presents with  . Abdominal Pain    HPI Carrie [name] is a 36 y.o. female.  HPI   Patient is a 36 year old female with history of diabetes, hypertension and gastroparesis who presents the ED with complaint of abdominal pain, onset 3 days. Patient reports for the past 2 weeks she has had nausea and vomiting and states she has been unable to keep any fluids or medications down for the past week. She notes this is her third visit to the ED for her symptoms. She also states seen her PCP yesterday afternoon for follow-up evaluation and notes she was given prescriptions for Reglan, lactulose, Cipro and Flagyl. Patient reports her PCP thought she may have early onset of a UTI. Patient reports 3 days ago she began having constant sharp pain to the left side of her abdomen. Denies any aggravating or alleviating factors. Endorses associated chills. Denies fever, shortness of breath, chest pain, hematemesis, diarrhea, urinary symptoms, vaginal bleeding or discharge. Patient reports her last bowel movement was 2 days ago. Patient reports she has only been taking half doses of her insulin every other day due to not eating or drinking over the past week due to her symptoms. Denies history of abdominal surgeries.  Past Medical History:  Diagnosis Date  . Diabetes mellitus without complication ([place])   . Diabetic neuropathy ([place])   . Gastroparesis   . HA (headache)   . Hypertension   . Neuropathy   . Renal disorder   . Trigeminal neuralgia     Patient Active Problem List   Diagnosis Date Noted  . Nausea with vomiting 04/04/2015  . AKI (acute kidney injury) ([place]) 04/04/2015  . DKA (diabetic ketoacidoses) ([place]) 04/03/2015  . Trigeminal neuralgia 12/12/2013  . HA (headache)   . DKA, type 2 ([place]) 10/24/2012  . Contact dermatitis 10/24/2012  . SORE THROAT  03/22/2010  . 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 FATIGUE 05/25/2009  . VAGINAL DISCHARGE 01/28/2009  . Carbuncle and furuncle of unspecified site 12/29/2008  . HYPERCHOLESTEROLEMIA 06/12/2008  . MICROALBUMINURIA 06/12/2008  . MONILIAL VAGINITIS 11/14/2007  . INGUINAL PAIN, BILATERAL 11/14/2007  . DYSURIA 10/17/2007  . CYSTITIS 09/05/2007  . DELAYED MENSES 09/05/2007  . RHINOCONJUNCTIVITIS, ALLERGIC 07/19/2007  . DENTAL PAIN 03/28/2007  . Uncontrolled type 1 diabetes mellitus ([place]) 09/01/2006  . HYPERLIPIDEMIA 09/01/2006  . PELVIC INFLAMMATORY DISEASE 09/01/2006  . ABORTION, SPONTANEOUS 09/01/2006  . ABSCESS 09/01/2006  . HX, PERSONAL, PAST NONCOMPLIANCE 09/01/2006    Past Surgical History:  Procedure Laterality Date  . EYE SURGERY    . None      OB History    No data available       Home Medications    Prior to Admission medications   Medication Sig Start Date End Date Taking? Authorizing Provider  acetaminophen (TYLENOL) 325 MG tablet Take 650 mg by mouth every 6 (six) hours as needed for mild pain.   Yes [provider]  carbamazepine (TEGRETOL) 200 MG tablet Take 200 mg by mouth every evening.    Yes [provider]  ciprofloxacin (CIPRO) 500 MG tablet Take 500 mg by mouth 2 (two) times daily. 09/03/16  Yes [provider]  gabapentin (NEURONTIN) 300 MG capsule Take 300 mg by mouth 2 (two) times daily.    Yes [provider]  hydrALAZINE (APRESOLINE) 50 MG  tablet Take 50 mg by mouth 3 (three) times daily. 08/25/16  Yes [provider]  Insulin Glargine (LANTUS SOLOSTAR) 100 UNIT/ML Solostar Pen Inject 50 Units into the skin every morning. And pen needles 1/day Patient taking differently: Inject 30 Units into the skin every morning. And pen needles 1/day 06/08/16  Yes Renato Shin, MD  labetalol (NORMODYNE) 300 MG tablet Take 300 mg by mouth 3 (three) times daily. 08/25/16  Yes [provider]  lactulose (CHRONULAC) 10  GM/15ML solution Take 10 g by mouth 3 (three) times daily. 09/03/16  Yes [provider]  metoCLOPramide (REGLAN) 10 MG tablet Take 10 mg by mouth daily. 09/03/16  Yes [provider]  metroNIDAZOLE (FLAGYL) 500 MG tablet Take 500 mg by mouth 3 (three) times daily. 09/03/16  Yes [provider]  sevelamer carbonate (RENVELA) 800 MG tablet Take 800 mg by mouth 3 (three) times daily with meals.   Yes [provider]  torsemide (DEMADEX) 20 MG tablet Take 20 mg by mouth every evening.   Yes [provider]  ondansetron (ZOFRAN) 4 MG tablet Take 1 tablet (4 mg total) by mouth every 8 (eight) hours as needed for nausea or vomiting. Patient not taking: Reported on 09/04/2016 04/04/15   Geradine Girt, DO  promethazine (PHENERGAN) 25 MG tablet Take 1 tablet (25 mg total) by mouth every 6 (six) hours as needed for nausea or vomiting. Patient not taking: Reported on 09/04/2016 08/31/16   Domenic Moras, PA-C    Family History Family History  Problem Relation Age of Onset  . Hyperlipidemia Mother   . Asthma Daughter   . Diabetes Maternal Grandmother   . Kidney disease Maternal Grandfather   . Kidney disease Paternal Grandmother     Social History Social History  Substance Use Topics  . Smoking status: Never Smoker  . Smokeless tobacco: Never Used  . Alcohol use No     Comment: former use     Allergies   Patient has no known allergies.   Review of Systems Review of Systems  Constitutional: Positive for chills.  Gastrointestinal: Positive for abdominal pain, nausea and vomiting.  All other systems reviewed and are negative.    Physical Exam Updated Vital Signs BP (!) 186/99 (BP Location: Right Arm)   Pulse 82   Temp 99 F (37.2 C) (Oral)   Resp 18   Ht 5\' 8"  (1.727 m)   Wt 85.3 kg (188 lb)   LMP 07/16/2016 (Approximate)   SpO2 100%   BMI 28.59 kg/m   Physical Exam  Constitutional: She is oriented to person, place, and time. She  appears well-developed and well-nourished. No distress.  HENT:  Head: Normocephalic and atraumatic.  Mouth/Throat: Uvula is midline and oropharynx is clear and moist. Mucous membranes are dry. No oropharyngeal exudate, posterior oropharyngeal edema, posterior oropharyngeal erythema or tonsillar abscesses. No tonsillar exudate.  Eyes: Conjunctivae and EOM are normal. Right eye exhibits no discharge. Left eye exhibits no discharge. No scleral icterus.  Neck: Normal range of motion. Neck supple.  Cardiovascular: Normal rate, regular rhythm, normal heart sounds and intact distal pulses.   Pulmonary/Chest: Effort normal and breath sounds normal. No respiratory distress. She has no wheezes. She has no rales. She exhibits no tenderness.  Abdominal: Soft. Normal appearance and bowel sounds are normal. She exhibits no distension and no mass. There is tenderness in the left upper quadrant and left lower quadrant. There is no rigidity, no rebound, no guarding and no CVA tenderness.  No hernia.  Musculoskeletal: Normal range of motion. She exhibits no edema.  Neurological: She is alert and oriented to person, place, and time.  Skin: Skin is warm and dry. She is not diaphoretic.  Nursing note and vitals reviewed.    ED Treatments / Results  Labs (all labs ordered are listed, but only abnormal results are displayed) Labs Reviewed  COMPREHENSIVE METABOLIC PANEL - Abnormal; Notable for the following:       Result Value   Glucose, Bld 302 (*)    BUN 48 (*)    Creatinine, Ser 6.92 (*)    GFR calc non Af Amer 7 (*)    GFR calc Af Amer 8 (*)    All other components within normal limits  CBC - Abnormal; Notable for the following:    RBC 3.01 (*)    Hemoglobin 8.9 (*)    HCT 26.7 (*)    All other components within normal limits  URINALYSIS, ROUTINE W REFLEX MICROSCOPIC - Abnormal; Notable for the following:    APPearance HAZY (*)    Glucose, UA >=500 (*)    Protein, ur >=300 (*)    Squamous Epithelial  / LPF 0-5 (*)    All other components within normal limits  CBG MONITORING, ED - Abnormal; Notable for the following:    Glucose-Capillary 300 (*)    All other components within normal limits  LIPASE, BLOOD  POC URINE PREG, ED    EKG  EKG Interpretation None       Radiology Ct Abdomen Pelvis Wo Contrast  Result Date: 09/04/2016 CLINICAL DATA:  LEFT lower quadrant pain for 2-4 weeks EXAM: CT ABDOMEN AND PELVIS WITHOUT CONTRAST TECHNIQUE: Multidetector CT imaging of the abdomen and pelvis was performed following the standard protocol without IV contrast. COMPARISON:  None. FINDINGS: Lower chest: Lung bases are clear. Hepatobiliary: No focal hepatic lesion. No biliary duct dilatation. Gallbladder is normal. Common bile duct is normal. Pancreas: Pancreas is normal. No ductal dilatation. No pancreatic inflammation. Spleen: Normal spleen Adrenals/urinary tract: Adrenal glands and kidneys are normal. No ureterolithiasis or obstructive uropathy. Bladder normal Stomach/Bowel: Stomach, small-bowel appendix and cecum normal. There is moderate volume stool throughout the colon. Descending colon and rectosigmoid colon normal. Vascular/Lymphatic: Abdominal aorta is normal caliber. There is no retroperitoneal or periportal lymphadenopathy. No pelvic lymphadenopathy. Reproductive: Uterus and ribs normal. Other: The inguinal hernia. Small pleural hernias fat filled measuring 15 mm by 19 mm pre Musculoskeletal: No aggressive osseous lesion. IMPRESSION: 1. No explanation for LEFT lower quadrant pain. 2. No inguinal hernia.  Small umbilical hernia. 3. No nephrolithiasis or obstructive uropathy. Electronically Signed   By: Suzy Bouchard M.D.   On: 09/04/2016 14:57    Procedures Procedures (including critical care time)  Medications Ordered in ED Medications  sodium chloride 0.9 % bolus 1,000 mL (1,000 mLs Intravenous New Bag/Given 09/04/16 1454)  promethazine (PHENERGAN) injection 25 mg (25 mg Intravenous  Given 09/04/16 1454)  morphine 4 MG/ML injection 4 mg (4 mg Intravenous Given 09/04/16 1454)  haloperidol lactate (HALDOL) injection 5 mg (5 mg Intravenous Given 09/04/16 1531)     Initial Impression / Assessment and Plan / ED Course  I have reviewed the triage vital signs and the nursing notes.  Pertinent labs & imaging results that were available during my care of the patient were reviewed by me and considered in my medical decision making (see chart for details).     Patient presents with continued nausea and vomiting for the past 2  weeks. Reports having new onset left-sided abdominal pain that started 3 days ago. She notes she is able to keep anything down at home, is not available take her home medications and has only been taking half doses of her insulin every other day. History of type 1 diabetes and gastroparesis. VSS. Exam revealed tenderness over her left side of abdomen, no peritoneal signs. Dry mucous membranes. Remaining exam unremarkable. Patient given IV fluids, pain meds and antiemetics.  Chart review shows patient was initially seen in the ED on 08/29/16 for nausea or vomiting. Patient was noted to have decreased kidney function but records show her kidney function has been low over the past month and she was recently taken off Reglan secondary to dystonic type reaction. Patient's symptoms improved in the ED with antiemetics and she was discharged home with outpatient follow-up. Patient was seen in the ED a second time on 08/31/16 for continued symptoms. PT is being follow by [facility] regarding her CKD and is currently in the process of establishing care with transplant team.   Pregnancy negative. Urine revealed glucose, no ketones or signs of infection. Glucose 300, no AG. Creatinine 6.92, BUN 48. No leukocytosis. CT abdomen unremarkable. On reevaluation patient continues to report having abdominal pain and nausea. Discussed results and plan for admission. Plan to admit patient  to the hospital for observation for intractable vomiting 2/2 gastroparesis. Consulted hospitalist, Dr. Sloan Leiter agrees to admission.   Final Clinical Impressions(s) / ED Diagnoses   Final diagnoses:  Gastroparesis  Intractable vomiting with nausea, unspecified vomiting type  Chronic kidney disease, unspecified CKD stage    New Prescriptions New Prescriptions   No medications on file     Nona Dell, Hershal Coria 09/04/16 1546    Lacretia Leigh, MD 09/05/16 1525

## 2016-09-05 DIAGNOSIS — N185 Chronic kidney disease, stage 5: Secondary | ICD-10-CM

## 2016-09-05 LAB — IRON AND TIBC
Iron: 59 ug/dL (ref 28–170)
SATURATION RATIOS: 25 % (ref 10.4–31.8)
TIBC: 235 ug/dL — ABNORMAL LOW (ref 250–450)
UIBC: 176 ug/dL

## 2016-09-05 LAB — RENAL FUNCTION PANEL
ALBUMIN: 3 g/dL — AB (ref 3.5–5.0)
Anion gap: 6 (ref 5–15)
BUN: 43 mg/dL — ABNORMAL HIGH (ref 6–20)
CO2: 23 mmol/L (ref 22–32)
CREATININE: 7.09 mg/dL — AB (ref 0.44–1.00)
Calcium: 8.5 mg/dL — ABNORMAL LOW (ref 8.9–10.3)
Chloride: 108 mmol/L (ref 101–111)
GFR, EST AFRICAN AMERICAN: 8 mL/min — AB (ref >60)
GFR, EST NON AFRICAN AMERICAN: 7 mL/min — AB (ref >60)
Glucose, Bld: 205 mg/dL — ABNORMAL HIGH (ref 65–99)
PHOSPHORUS: 3.9 mg/dL (ref 2.5–4.6)
POTASSIUM: 4.2 mmol/L (ref 3.5–5.1)
Sodium: 137 mmol/L (ref 135–145)

## 2016-09-05 LAB — CBC
HEMATOCRIT: 24.3 % — AB (ref 36.0–46.0)
Hemoglobin: 7.8 g/dL — ABNORMAL LOW (ref 12.0–15.0)
MCH: 29 pg (ref 26.0–34.0)
MCHC: 32.1 g/dL (ref 30.0–36.0)
MCV: 90.3 fL (ref 78.0–100.0)
Platelets: 265 10*3/uL (ref 150–400)
RBC: 2.69 MIL/uL — AB (ref 3.87–5.11)
RDW: 13.4 % (ref 11.5–15.5)
WBC: 6.4 10*3/uL (ref 4.0–10.5)

## 2016-09-05 LAB — GLUCOSE, CAPILLARY
GLUCOSE-CAPILLARY: 185 mg/dL — AB (ref 65–99)
GLUCOSE-CAPILLARY: 157 mg/dL — AB (ref 65–99)
Glucose-Capillary: 178 mg/dL — ABNORMAL HIGH (ref 65–99)
Glucose-Capillary: 220 mg/dL — ABNORMAL HIGH (ref 65–99)

## 2016-09-05 LAB — HIV ANTIBODY (ROUTINE TESTING W REFLEX): HIV Screen 4th Generation wRfx: NONREACTIVE

## 2016-09-05 MED ORDER — DOCUSATE SODIUM 100 MG PO CAPS
200.0000 mg | ORAL_CAPSULE | Freq: Two times a day (BID) | ORAL | Status: DC
  Administered 2016-09-05 – 2016-09-11 (×13): 200 mg via ORAL
  Filled 2016-09-05 (×15): qty 2

## 2016-09-05 MED ORDER — AMLODIPINE BESYLATE 10 MG PO TABS
10.0000 mg | ORAL_TABLET | Freq: Every day | ORAL | Status: DC
  Administered 2016-09-05 – 2016-09-06 (×2): 10 mg via ORAL
  Filled 2016-09-05 (×2): qty 1

## 2016-09-05 MED ORDER — DARBEPOETIN ALFA 100 MCG/0.5ML IJ SOSY
100.0000 ug | PREFILLED_SYRINGE | INTRAMUSCULAR | Status: DC
  Administered 2016-09-05: 100 ug via SUBCUTANEOUS
  Filled 2016-09-05: qty 0.5

## 2016-09-05 MED ORDER — BISACODYL 10 MG RE SUPP
10.0000 mg | Freq: Every day | RECTAL | Status: DC
  Administered 2016-09-05 – 2016-09-10 (×4): 10 mg via RECTAL
  Filled 2016-09-05 (×6): qty 1

## 2016-09-05 NOTE — Progress Notes (Signed)
Pt. transferred from [facility] via [facility] to 6E-01 and to bed; alart and oriented x4; family with pt.; pt. oriented to room and call button; explained fall prevention and hospital policies re: valuables. Still some nausea.

## 2016-09-05 NOTE — Progress Notes (Signed)
Patient is a high fall risk per score. Patient refused bed alarm. Patient education provided. Will continue to educate and monitor.

## 2016-09-05 NOTE — Plan of Care (Signed)
Problem: Education: Goal: Knowledge of [place] [facility] Education information/materials will improve Outcome: Progressing POC reviewed with pt./mother.

## 2016-09-05 NOTE — Consult Note (Signed)
Patient name: Carrie [name] MRN: [id number] DOB: Jan 24, 1981 Sex: female   REASON FOR CONSULT:    Needs tunneled dialysis catheter and permanent access. Consult is from Dr. Candiss Norse.  HPI:   Carrie [name] is a 36 y.o. female, Who was admitted on 09/04/2016. She had been vomiting off and on for the last month. Her past medical history significant for chronic kidney disease secondary to diabetic nephropathy. Her creatinine has gradually gotten worse and for this reason vascular surgery's consult for placement of a tunneled dialysis catheter and to evaluate her for permanent access.  She is right-handed. Her nausea has improved.  She has not had any previous catheters. She does not have a pacemaker.  Past Medical History:  Diagnosis Date  . Diabetes mellitus without complication ([place])   . Diabetic neuropathy ([place])   . Gastroparesis   . HA (headache)   . Hypertension   . Neuropathy   . Renal disorder   . Trigeminal neuralgia     Family History  Problem Relation Age of Onset  . Hyperlipidemia Mother   . Asthma Daughter   . Diabetes Maternal Grandmother   . Kidney disease Maternal Grandfather   . Kidney disease Paternal Grandmother     SOCIAL HISTORY: Social History   Social History  . Marital status: Single    Spouse name: N/A  . Number of children: 1  . Years of education: 27   Occupational History  . [employer]   Social History Main Topics  . Smoking status: Never Smoker  . Smokeless tobacco: Never Used  . Alcohol use No     Comment: former use  . Drug use: No  . Sexual activity: Yes   Other Topics Concern  . Not on file   Social History Narrative   Patient lives at home with her father and daughter. Patient works full time at [employer] .   Education some college.   Right handed   Caffeine Five sodas daily.             No Known Allergies  Current Facility-Administered Medications  Medication Dose Route Frequency Provider Last Rate  Last Dose  . acetaminophen (TYLENOL) tablet 650 mg  650 mg Oral Q6H PRN Jonetta Osgood, MD   650 mg at 09/05/16 1246   Or  . acetaminophen (TYLENOL) suppository 650 mg  650 mg Rectal Q6H PRN Jonetta Osgood, MD      . albuterol (PROVENTIL) (2.5 MG/3ML) 0.083% nebulizer solution 2.5 mg  2.5 mg Nebulization Q2H PRN Ghimire, Henreitta Leber, MD      . amLODipine (NORVASC) tablet 10 mg  10 mg Oral Daily Thurnell Lose, MD   10 mg at 09/05/16 1104  . bisacodyl (DULCOLAX) suppository 10 mg  10 mg Rectal Daily Thurnell Lose, MD   10 mg at 09/05/16 1105  . carbamazepine (TEGRETOL) tablet 200 mg  200 mg Oral QPM Jonetta Osgood, MD   200 mg at 09/05/16 1716  . Darbepoetin Alfa (ARANESP) injection 100 mcg  100 mcg Subcutaneous Q Tue-1800 Edrick Oh, MD   100 mcg at 09/05/16 1715  . docusate sodium (COLACE) capsule 200 mg  200 mg Oral BID Thurnell Lose, MD   200 mg at 09/05/16 1104  . heparin injection 5,000 Units  5,000 Units Subcutaneous Q8H Jonetta Osgood, MD   5,000 Units at 09/05/16 5638  . hydrALAZINE (APRESOLINE) injection 10 mg  10 mg Intravenous Q6H PRN Ghimire,  Henreitta Leber, MD      . hydrALAZINE (APRESOLINE) tablet 50 mg  50 mg Oral TID Jonetta Osgood, MD   50 mg at 09/05/16 1608  . insulin aspart (novoLOG) injection 0-9 Units  0-9 Units Subcutaneous TID WC Jonetta Osgood, MD   3 Units at 09/05/16 1714  . insulin glargine (LANTUS) injection 30 Units  30 Units Subcutaneous q morning - 10a Jonetta Osgood, MD   30 Units at 09/05/16 [phone]  . labetalol (NORMODYNE) tablet 400 mg  400 mg Oral BID Jonetta Osgood, MD   400 mg at 09/05/16 0925  . ondansetron (ZOFRAN) injection 4 mg  4 mg Intravenous Q8H Ghimire, Henreitta Leber, MD   4 mg at 09/05/16 1437  . pantoprazole (PROTONIX) EC tablet 40 mg  40 mg Oral Daily Jonetta Osgood, MD   40 mg at 09/05/16 0925  . polyethylene glycol (MIRALAX / GLYCOLAX) packet 17 g  17 g Oral Daily Jonetta Osgood, MD   17 g at 09/05/16 0925    . promethazine (PHENERGAN) injection 25 mg  25 mg Intravenous Q6H PRN Jonetta Osgood, MD   25 mg at 09/05/16 1246  . sevelamer carbonate (RENVELA) tablet 800 mg  800 mg Oral TID WC Ghimire, Henreitta Leber, MD   800 mg at 09/05/16 1716    REVIEW OF SYSTEMS:  [X]  denotes positive finding, [ ]  denotes negative finding Cardiac  Comments:  Chest pain or chest pressure:    Shortness of breath upon exertion:    Short of breath when lying flat:    Irregular heart rhythm:        Vascular    Pain in calf, thigh, or hip brought on by ambulation:    Pain in feet at night that wakes you up from your sleep:     Blood clot in your veins:    Leg swelling:         Pulmonary    Oxygen at home:    Productive cough:     Wheezing:         Neurologic    Sudden weakness in arms or legs:     Sudden numbness in arms or legs:     Sudden onset of difficulty speaking or slurred speech:    Temporary loss of vision in one eye:     Problems with dizziness:         Gastrointestinal    Blood in stool:     Vomited blood:         Genitourinary    Burning when urinating:     Blood in urine:        Psychiatric    Major depression:         Hematologic    Bleeding problems:    Problems with blood clotting too easily:        Skin    Rashes or ulcers:        Constitutional    Fever or chills:     PHYSICAL EXAM:   Vitals:   09/05/16 0900 09/05/16 1104 09/05/16 1608 09/05/16 1726  BP: (!) 141/89 (!) 147/77 137/74 135/81  Pulse: 89   86  Resp: 16   17  Temp: 98.1 F (36.7 C)   98.2 F (36.8 C)  TempSrc: Oral   Oral  SpO2: 100%   100%  Weight:      Height:        GENERAL: The patient is a well-nourished  female, in no acute distress. The vital signs are documented above. CARDIAC: There is a regular rate and rhythm.  VASCULAR: She has palpable radial pulses bilaterally. She has an IV in her right forearm. PULMONARY: There is good air exchange bilaterally without wheezing or rales. ABDOMEN:  Soft and non-tender with normal pitched bowel sounds.  MUSCULOSKELETAL: There are no major deformities or cyanosis. NEUROLOGIC: No focal weakness or paresthesias are detected. SKIN: There are no ulcers or rashes noted. PSYCHIATRIC: The patient has a normal affect.  DATA:    VEIN MAP: Her vein mapping is pending.  CT ABDOMEN: Her CT of the abdomen done yesterday was unremarkable. There was no explanation for her left lower quadrant pain. There was no evidence of nephrolithiasis or obstructive uropathy.  Her GFR is 8. Her potassium is 4.2. Hemoglobin is 7.8.  MEDICAL ISSUES:   STAGE V CHRONIC KIDNEY DISEASE: Pending the results of her vein map we can evaluate her for long-term access. I will try to find room on the schedule for Thursday or Friday for surgery. I have discussed the procedure potential complications with her and she is agreeable to proceed.  Deitra Mayo [facility] [phone]

## 2016-09-05 NOTE — Consult Note (Signed)
Referring Provider: No ref. provider found Primary Care Physician:  Annetta Maw, MD Primary Nephrologist: none  Reason for Consultation:  Progressive renal insufficiency secondary to diabetic nephropathy  HPI: Carrie [name] is a 36 y.o. female with medical history significant of stage V chronic kidney disease, type 1 diabetes, trigeminal neuralgia, peripheral neuropathy who presented to the hospital (third visit this month) for evaluation of the above-noted complaints. Per patient, she has a known history of gastroparesis-and was maintained on Reglan for approximately 1 year. Creatinine 1/18  2.5 --- 5.6 --- 7  Greater than 300 mg proteinuria   CT abdomen no hydronephrosis.    Past Medical History:  Diagnosis Date  . Diabetes mellitus without complication ([place])   . Diabetic neuropathy ([place])   . Gastroparesis   . HA (headache)   . Hypertension   . Neuropathy   . Renal disorder   . Trigeminal neuralgia     Past Surgical History:  Procedure Laterality Date  . EYE SURGERY    . None      Prior to Admission medications   Medication Sig Start Date End Date Taking? Authorizing Provider  acetaminophen (TYLENOL) 325 MG tablet Take 650 mg by mouth every 6 (six) hours as needed for mild pain.   Yes [provider]  carbamazepine (TEGRETOL) 200 MG tablet Take 200 mg by mouth every evening.    Yes [provider]  ciprofloxacin (CIPRO) 500 MG tablet Take 500 mg by mouth 2 (two) times daily. 09/03/16  Yes [provider]  gabapentin (NEURONTIN) 300 MG capsule Take 300 mg by mouth 2 (two) times daily.    Yes [provider]  hydrALAZINE (APRESOLINE) 50 MG tablet Take 50 mg by mouth 3 (three) times daily. 08/25/16  Yes [provider]  Insulin Glargine (LANTUS SOLOSTAR) 100 UNIT/ML Solostar Pen Inject 50 Units into the skin every morning. And pen needles 1/day Patient taking differently: Inject 30 Units into the skin every morning. And pen needles  1/day 06/08/16  Yes Renato Shin, MD  labetalol (NORMODYNE) 300 MG tablet Take 300 mg by mouth 3 (three) times daily. 08/25/16  Yes [provider]  lactulose (CHRONULAC) 10 GM/15ML solution Take 10 g by mouth 3 (three) times daily. 09/03/16  Yes [provider]  metoCLOPramide (REGLAN) 10 MG tablet Take 10 mg by mouth daily. 09/03/16  Yes [provider]  metroNIDAZOLE (FLAGYL) 500 MG tablet Take 500 mg by mouth 3 (three) times daily. 09/03/16  Yes [provider]  sevelamer carbonate (RENVELA) 800 MG tablet Take 800 mg by mouth 3 (three) times daily with meals.   Yes [provider]  torsemide (DEMADEX) 20 MG tablet Take 20 mg by mouth every evening.   Yes [provider]  ondansetron (ZOFRAN) 4 MG tablet Take 1 tablet (4 mg total) by mouth every 8 (eight) hours as needed for nausea or vomiting. Patient not taking: Reported on 09/04/2016 04/04/15   Geradine Girt, DO  promethazine (PHENERGAN) 25 MG tablet Take 1 tablet (25 mg total) by mouth every 6 (six) hours as needed for nausea or vomiting. Patient not taking: Reported on 09/04/2016 08/31/16   Domenic Moras, PA-C    Current Facility-Administered Medications  Medication Dose Route Frequency Provider Last Rate Last Dose  . 0.9 %  sodium chloride infusion   Intravenous Continuous Jonetta Osgood, MD 75 mL/hr at 09/05/16 [phone]    . acetaminophen (TYLENOL) tablet 650 mg  650 mg Oral Q6H PRN Ghimire, Shanker  M, MD       Or  . acetaminophen (TYLENOL) suppository 650 mg  650 mg Rectal Q6H PRN Jonetta Osgood, MD      . albuterol (PROVENTIL) (2.5 MG/3ML) 0.083% nebulizer solution 2.5 mg  2.5 mg Nebulization Q2H PRN Ghimire, Henreitta Leber, MD      . carbamazepine (TEGRETOL) tablet 200 mg  200 mg Oral QPM Jonetta Osgood, MD   200 mg at 09/05/16 0003  . heparin injection 5,000 Units  5,000 Units Subcutaneous Q8H Jonetta Osgood, MD   5,000 Units at 09/05/16 4496  . hydrALAZINE (APRESOLINE)  injection 10 mg  10 mg Intravenous Q6H PRN Jonetta Osgood, MD      . hydrALAZINE (APRESOLINE) tablet 50 mg  50 mg Oral TID Jonetta Osgood, MD   50 mg at 09/05/16 0004  . insulin aspart (novoLOG) injection 0-9 Units  0-9 Units Subcutaneous TID WC Jonetta Osgood, MD   2 Units at 09/05/16 [phone]  . insulin glargine (LANTUS) injection 30 Units  30 Units Subcutaneous q morning - 10a Ghimire, Henreitta Leber, MD      . labetalol (NORMODYNE) tablet 400 mg  400 mg Oral BID Jonetta Osgood, MD   400 mg at 09/05/16 0005  . ondansetron (ZOFRAN) injection 4 mg  4 mg Intravenous Q8H Ghimire, Henreitta Leber, MD   4 mg at 09/05/16 6384  . pantoprazole (PROTONIX) EC tablet 40 mg  40 mg Oral Daily Jonetta Osgood, MD   40 mg at 09/05/16 0005  . polyethylene glycol (MIRALAX / GLYCOLAX) packet 17 g  17 g Oral Daily Ghimire, Shanker M, MD      . promethazine (PHENERGAN) injection 25 mg  25 mg Intravenous Q6H PRN Jonetta Osgood, MD   25 mg at 09/05/16 0124  . senna (SENOKOT) tablet 17.2 mg  2 tablet Oral QHS Jonetta Osgood, MD   17.2 mg at 09/05/16 0005  . sevelamer carbonate (RENVELA) tablet 800 mg  800 mg Oral TID WC Jonetta Osgood, MD   800 mg at 09/05/16 0827    Allergies as of 09/04/2016  . (No Known Allergies)    Family History  Problem Relation Age of Onset  . Hyperlipidemia Mother   . Asthma Daughter   . Diabetes Maternal Grandmother   . Kidney disease Maternal Grandfather   . Kidney disease Paternal Grandmother     Social History   Social History  . Marital status: Single    Spouse name: N/A  . Number of children: 1  . Years of education: 72   Occupational History  . [employer]   Social History Main Topics  . Smoking status: Never Smoker  . Smokeless tobacco: Never Used  . Alcohol use No     Comment: former use  . Drug use: No  . Sexual activity: Yes   Other Topics Concern  . Not on file   Social History Narrative   Patient lives at home with  her father and daughter. Patient works full time at [employer] .   Education some college.   Right handed   Caffeine Five sodas daily.             Review of Systems: Gen: Denies any fever, chills, sweats, anorexia, +  fatigue,  + weakness,  + malaise, weight loss, and sleep disorder HEENT: No visual complaints, No history of Retinopathy. Normal external appearance No Epistaxis or Sore throat. No sinusitis.   CV:  Denies chest pain, angina, palpitations, syncope, orthopnea, PND, peripheral edema, and claudication. Resp: Denies dyspnea at rest, dyspnea with exercise, cough, sputum, wheezing, coughing up blood, and pleurisy. GI: + vomiting  No  blood, jaundice, and fecal incontinence.   Denies dysphagia or odynophagia. GU : Denies urinary burning, blood in urine, urinary frequency, urinary hesitancy, nocturnal urination, and urinary incontinence.  No renal calculi. MS: Denies joint pain, limitation of movement, and swelling, stiffness, low back pain, extremity pain. Denies muscle weakness, cramps, atrophy.  No use of non steroidal antiinflammatory drugs. Derm: Denies rash, itching, dry skin, hives, moles, warts, or unhealing ulcers.  Psych: Denies depression, anxiety, memory loss, suicidal ideation, hallucinations, paranoia, and confusion. Heme: Denies bruising, bleeding, and enlarged lymph nodes. Neuro: No headache.  No diplopia. No dysarthria.  No dysphasia.  No history of CVA.  No Seizures. No paresthesias.  No weakness. Endocrine  DM.  No Thyroid disease.  No Adrenal disease.  Physical Exam: Vital signs in last 24 hours: Temp:  [98.7 F (37.1 C)-99 F (37.2 C)] 98.9 F (37.2 C) (05/22 0438) Pulse Rate:  [82-100] 90 (05/22 0438) Resp:  [10-18] 18 (05/22 0438) BP: (161-186)/(89-114) 161/89 (05/22 0438) SpO2:  [97 %-100 %] 100 % (05/22 0438) Weight:  [186 lb 8.2 oz (84.6 kg)-188 lb (85.3 kg)] 186 lb 8.2 oz (84.6 kg) (05/21 2048) Last BM Date: 08/28/16 General:    Ill appearing Head:   Normocephalic and atraumatic. Eyes:  Sclera clear, no icterus.   Conjunctiva pink. Ears:  Normal auditory acuity. Nose:  No deformity, discharge,  or lesions. Mouth:  No deformity or lesions, dentition normal. Neck:  Supple; no masses or thyromegaly. JVP not elevated Lungs:  Clear throughout to auscultation.   No wheezes, crackles, or rhonchi. No acute distress. Heart:  Regular rate and rhythm; no murmurs, clicks, rubs,  or gallops. Abdomen:  Soft, nontender and nondistended. No masses, hepatosplenomegaly or hernias noted. Normal bowel sounds, without guarding, and without rebound.   Msk:  Symmetrical without gross deformities. Normal posture. Pulses:  No carotid, renal, femoral bruits. DP and PT symmetrical and equal Extremities:  Without clubbing or edema. Neurologic:  Alert and  oriented x4;  grossly normal neurologically. Skin:  Intact without significant lesions or rashes. Cervical Nodes:  No significant cervical adenopathy. Psych:  Alert and cooperative. Normal mood and affect.  Intake/Output from previous day: 05/21 0701 - 05/22 0700 In: 1000 [P.O.:100; I.V.:900] Out: 700 [Urine:700] Intake/Output this shift: Total I/O In: 362.5 [P.O.:240; I.V.:122.5] Out: -   Lab Results:  Recent Labs  09/04/16 1243 09/04/16 2106 09/05/16 0705  WBC 7.2 6.3 6.4  HGB 8.9* 8.4* 7.8*  HCT 26.7* 25.7* 24.3*  PLT 315 281 265   BMET  Recent Labs  09/04/16 1243 09/04/16 2106 09/05/16 0705  NA 135  --  137  K 4.7  --  4.2  CL 101  --  108  CO2 23  --  23  GLUCOSE 302*  --  205*  BUN 48*  --  43*  CREATININE 6.92* 6.75* 7.09*  CALCIUM 9.2  --  8.5*  PHOS  --   --  3.9   LFT  Recent Labs  09/04/16 1243 09/05/16 0705  PROT 7.2  --   ALBUMIN 3.9 3.0*  AST 20  --   ALT 19  --   ALKPHOS 69  --   BILITOT 0.4  --    PT/INR No results for input(s): LABPROT, INR in the last 72 hours. Hepatitis  Panel No results for input(s): HEPBSAG, HCVAB, HEPAIGM, HEPBIGM in the last 72  hours.  Studies/Results: Ct Abdomen Pelvis Wo Contrast  Result Date: 09/04/2016 CLINICAL DATA:  LEFT lower quadrant pain for 2-4 weeks EXAM: CT ABDOMEN AND PELVIS WITHOUT CONTRAST TECHNIQUE: Multidetector CT imaging of the abdomen and pelvis was performed following the standard protocol without IV contrast. COMPARISON:  None. FINDINGS: Lower chest: Lung bases are clear. Hepatobiliary: No focal hepatic lesion. No biliary duct dilatation. Gallbladder is normal. Common bile duct is normal. Pancreas: Pancreas is normal. No ductal dilatation. No pancreatic inflammation. Spleen: Normal spleen Adrenals/urinary tract: Adrenal glands and kidneys are normal. No ureterolithiasis or obstructive uropathy. Bladder normal Stomach/Bowel: Stomach, small-bowel appendix and cecum normal. There is moderate volume stool throughout the colon. Descending colon and rectosigmoid colon normal. Vascular/Lymphatic: Abdominal aorta is normal caliber. There is no retroperitoneal or periportal lymphadenopathy. No pelvic lymphadenopathy. Reproductive: Uterus and ribs normal. Other: The inguinal hernia. Small pleural hernias fat filled measuring 15 mm by 19 mm pre Musculoskeletal: No aggressive osseous lesion. IMPRESSION: 1. No explanation for LEFT lower quadrant pain. 2. No inguinal hernia.  Small umbilical hernia. 3. No nephrolithiasis or obstructive uropathy. Electronically Signed   By: Suzy Bouchard M.D.   On: 09/04/2016 14:57    Assessment/Plan:  Chronic Renal Disease secondary to diabetic nephropathy  Creatinine has gradually worsened to about 7   I suspect that this is an irreversible consequence of her diabetic renal disease. We shall proceed with preparations for dialysis and will have a permcath and vascular access studies   HTN  Appears labile will follow and will adjust antihypertensives   Diabetes controlled with insulin  Bones  Check PTH  Anemia check iron and will add aranesp    LOS: 1 Christene Pounds  W @TODAY @8 :43 AM

## 2016-09-05 NOTE — Progress Notes (Signed)
Initial Nutrition Assessment  INTERVENTION:   Supplement diet as appropriate once advanced  NUTRITION DIAGNOSIS:   Inadequate oral intake related to altered GI function (nausea) as evidenced by meal completion < 50%.  GOAL:   Patient will meet greater than or equal to 90% of their needs  MONITOR:   PO intake, I & O's  REASON FOR ASSESSMENT:   Malnutrition Screening Tool    ASSESSMENT:   Carrie [name] with PMH of type 1 DM, stage V CKD, trigeminal neuralgia, peripheral neuropathy admitted for abd pain, nausea. Carrie [name] was taken off Reglan due to early tardive dyskinesia.    Per Carrie [name] her usual weight is 150-160 lb. She has been up to 203 lb recently due to fluid. She reports that her renal doctor put her on a low sodium diet recently. She was taken off her Reglan 2 months ago and has had N/V since last Wednesday.  Once feeling better Carrie [name] would likely benefit from gastroparesis/low sodium diet education. Carrie [name] was unfamiliar with diet guidelines.  Nutrition-Focused physical exam completed. Findings are no fat depletion, no muscle depletion, and no edema.   CBG's: 185-178  Diet Order:  Diet clear liquid Room service appropriate? Yes; Fluid consistency: Thin  Skin:  Reviewed, no issues  Last BM:  5/22  Height:   Ht Readings from Last 1 Encounters:  09/04/16 5\' 8"  (1.727 m)    Weight:   Wt Readings from Last 1 Encounters:  09/04/16 186 lb 8.2 oz (84.6 kg)    Ideal Body Weight:  63.6 kg  BMI:  Body mass index is 28.36 kg/m.  Estimated Nutritional Needs:   Kcal:  1700-1900  Protein:  80-90 grams  Fluid:  > 1.7 L/day  EDUCATION NEEDS:   Education needs no appropriate at this time  [place], [place], [place] Pager [phone] After Hours Pager

## 2016-09-05 NOTE — Progress Notes (Signed)
@IPLOG         PROGRESS NOTE                                                                                                                                                                                                             Patient Demographics:    Carrie [name], is a 36 y.o. female, DOB - February 27, 1981, NTI:[id number]  Admit date - 09/04/2016   Admitting Physician Evalee Mutton Kristeen Mans, MD  Outpatient Primary MD for the patient is Annetta Maw, MD  LOS - 1  Chief Complaint  Patient presents with  . Abdominal Pain       Brief Narrative  Carrie [name] is a 37 y.o. female with medical history significant of stage V chronic kidney disease, type 1 diabetes, trigeminal neuralgia, peripheral neuropathy who presented to the hospital (third visit this month) for evaluation of the above-noted complaints. Per patient, she has a known history of gastroparesis-and was maintained on Reglan for approximately 1 year. Approximately 2 months back, Reglan was discontinued by her primary nephrologist and primary care practitioner due to concerns of early tardive dyskinesia. She then started having intermittent nausea and vomiting, that has gradually worsened, workup in the ER showed CT scan with moderate stool burden but no acute findings, creatinine close to 7, she was admitted for persistent nausea vomiting, now likely ESRD with possible dialysis need.   Subjective:    Carrie [name] today has, No headache, No chest pain, No abdominal pain - No Nausea, No new weakness tingling or numbness, No Cough - SOB.    Assessment  & Plan :      1.Diabetic gastroparesis with persistent nausea vomiting. Cannot use Reglan due to side effect of tardive dyskinesia, patient also noted to have moderate stool burden on CT scan, placed on bowel regimen and bowel rest with clear liquids, thankfully she has responded well to supportive care with PPI, antibiotics and bowel rest. Currently nausea and emesis free since last  night. Continue treatment for constipation, supportive care and monitor. Abdominal exam is benign. Question if uremia control but into nausea.  2. CK D 5 now close to ESRD. Was due to see [facility] in that office soon, [facility] has been uncertain, they requested that vascular surgery be called for dialysis catheter placement which I have done. Patient likely will require placement of dialysis catheter, could have progressed to ESRD.  3. History of hypertension. Blood pressure currently stable on labetalol, hydralazine which will be continued, will add Norvasc for better control.  4. Anemia of chronic disease. No acute issues.  5. History of trigeminal neuralgia. Continue Tegretol.  6. Diabetic peripheral neuropathy. Neurontin held due to worsening renal function and possible resulting toxicity.  7. GERD. On PPI.   8. DM type I. Currently on Lantus and sliding scale will monitor.  Lab Results  Component Value Date   HGBA1C 7.3 06/08/2016   CBG (last 3)   Recent Labs  09/04/16 1945 09/04/16 2049 09/05/16 0754  GLUCAP 188* 171* 185*      Diet : Diet clear liquid Room service appropriate? Yes; Fluid consistency: Thin    Family Communication  :  None  Code Status :  Full  Disposition Plan  :  TBD  Consults  :  Renal, VVS  Procedures  :    CT abdomen and pelvis. Nonacute with moderate stool burden  DVT Prophylaxis  :  Heparin     Lab Results  Component Value Date   PLT 265 09/05/2016    Inpatient Medications  Scheduled Meds: . bisacodyl  10 mg Rectal Daily  . carbamazepine  200 mg Oral QPM  . darbepoetin (ARANESP) injection - NON-DIALYSIS  100 mcg Subcutaneous Q Tue-1800  . docusate sodium  200 mg Oral BID  . heparin  5,000 Units Subcutaneous Q8H  . hydrALAZINE  50 mg Oral TID  . insulin aspart  0-9 Units Subcutaneous TID WC  . insulin glargine  30 Units Subcutaneous q morning - 10a  . labetalol  400 mg Oral BID  . ondansetron (ZOFRAN) IV  4 mg  Intravenous Q8H  . pantoprazole  40 mg Oral Daily  . polyethylene glycol  17 g Oral Daily  . sevelamer carbonate  800 mg Oral TID WC   Continuous Infusions: . sodium chloride 75 mL/hr at 09/05/16 0826   PRN Meds:.acetaminophen **OR** acetaminophen, albuterol, hydrALAZINE, promethazine  Antibiotics  :    Anti-infectives    None         Objective:   Vitals:   09/04/16 1939 09/04/16 2048 09/05/16 0438 09/05/16 0900  BP: (!) 184/110 (!) 167/114 (!) 161/89 (!) 141/89  Pulse: 85 100 90 89  Resp: 12  18 16   Temp:  98.7 F (37.1 C) 98.9 F (37.2 C) 98.1 F (36.7 C)  TempSrc:   Oral Oral  SpO2: 99% 99% 100% 100%  Weight:  84.6 kg (186 lb 8.2 oz)    Height:        Wt Readings from Last 3 Encounters:  09/04/16 84.6 kg (186 lb 8.2 oz)  08/31/16 90.7 kg (200 lb)  08/29/16 90.7 kg (200 lb)     Intake/Output Summary (Last 24 hours) at 09/05/16 1017 Last data filed at 09/05/16 0925  Gross per 24 hour  Intake          1673.75 ml  Output              900 ml  Net           773.75 ml     Physical Exam  Awake Alert, Oriented X 3, No new F.N deficits, Normal affect [place].AT,PERRAL Supple Neck,No JVD, No cervical lymphadenopathy appriciated.  Symmetrical Chest wall movement, Good air movement bilaterally, CTAB RRR,No Gallops,Rubs or new Murmurs, No Parasternal Heave +ve B.Sounds, Abd Soft, No tenderness, No organomegaly appriciated, No rebound - guarding or rigidity. No Cyanosis, Clubbing or edema, No new Rash or bruise       Data Review:    CBC  Recent Labs Lab 08/29/16  1810 08/31/16 1031 09/04/16 1243 09/04/16 2106 09/05/16 0705  WBC 5.2 6.1 7.2 6.3 6.4  HGB 8.3* 8.8* 8.9* 8.4* 7.8*  HCT 24.8* 26.0* 26.7* 25.7* 24.3*  PLT 295 315 315 281 265  MCV 89.2 88.7 88.7 88.9 90.3  MCH 29.9 30.0 29.6 29.1 29.0  MCHC 33.5 33.8 33.3 32.7 32.1  RDW 13.3 13.3 13.4 13.2 13.4  LYMPHSABS  --  1.4  --   --   --   MONOABS  --  0.4  --   --   --   EOSABS  --  0.1  --   --   --    BASOSABS  --  0.0  --   --   --     Chemistries   Recent Labs Lab 08/29/16 1810 08/31/16 1031 09/04/16 1243 09/04/16 2106 09/05/16 0705  NA 133* 138 135  --  137  K 4.4 4.1 4.7  --  4.2  CL 102 103 101  --  108  CO2 23 25 23   --  23  GLUCOSE 276* 245* 302*  --  205*  BUN 48* 44* 48*  --  43*  CREATININE 6.46* 6.46* 6.92* 6.75* 7.09*  CALCIUM 8.7* 9.3 9.2  --  8.5*  AST  --  26 20  --   --   ALT  --  20 19  --   --   ALKPHOS  --  69 69  --   --   BILITOT  --  0.6 0.4  --   --    ------------------------------------------------------------------------------------------------------------------ No results for input(s): CHOL, HDL, LDLCALC, TRIG, CHOLHDL, LDLDIRECT in the last 72 hours.  Lab Results  Component Value Date   HGBA1C 7.3 06/08/2016   ------------------------------------------------------------------------------------------------------------------ No results for input(s): TSH, T4TOTAL, T3FREE, THYROIDAB in the last 72 hours.  Invalid input(s): FREET3 ------------------------------------------------------------------------------------------------------------------ No results for input(s): VITAMINB12, FOLATE, FERRITIN, TIBC, IRON, RETICCTPCT in the last 72 hours.  Coagulation profile No results for input(s): INR, PROTIME in the last 168 hours.  No results for input(s): DDIMER in the last 72 hours.  Cardiac Enzymes No results for input(s): CKMB, TROPONINI, MYOGLOBIN in the last 168 hours.  Invalid input(s): CK ------------------------------------------------------------------------------------------------------------------ No results found for: BNP  Micro Results No results found for this or any previous visit (from the past 240 hour(s)).  Radiology Reports Ct Abdomen Pelvis Wo Contrast  Result Date: 09/04/2016 CLINICAL DATA:  LEFT lower quadrant pain for 2-4 weeks EXAM: CT ABDOMEN AND PELVIS WITHOUT CONTRAST TECHNIQUE: Multidetector CT imaging of the  abdomen and pelvis was performed following the standard protocol without IV contrast. COMPARISON:  None. FINDINGS: Lower chest: Lung bases are clear. Hepatobiliary: No focal hepatic lesion. No biliary duct dilatation. Gallbladder is normal. Common bile duct is normal. Pancreas: Pancreas is normal. No ductal dilatation. No pancreatic inflammation. Spleen: Normal spleen Adrenals/urinary tract: Adrenal glands and kidneys are normal. No ureterolithiasis or obstructive uropathy. Bladder normal Stomach/Bowel: Stomach, small-bowel appendix and cecum normal. There is moderate volume stool throughout the colon. Descending colon and rectosigmoid colon normal. Vascular/Lymphatic: Abdominal aorta is normal caliber. There is no retroperitoneal or periportal lymphadenopathy. No pelvic lymphadenopathy. Reproductive: Uterus and ribs normal. Other: The inguinal hernia. Small pleural hernias fat filled measuring 15 mm by 19 mm pre Musculoskeletal: No aggressive osseous lesion. IMPRESSION: 1. No explanation for LEFT lower quadrant pain. 2. No inguinal hernia.  Small umbilical hernia. 3. No nephrolithiasis or obstructive uropathy. Electronically Signed   By: Helane Gunther.D.  On: 09/04/2016 14:57      Time Spent in minutes  30   Lala Lund M.D on 09/05/2016 at 10:17 AM  Between 7am to 7pm - Pager - [phone] ( page via [email or web address], text pages only, please mention full 10 digit call back number). After 7pm go to [email or web address] - password [facility]

## 2016-09-05 NOTE — Progress Notes (Signed)
Inpatient Diabetes Program Recommendations  AACE/ADA: New Consensus Statement on Inpatient Glycemic Control (2015)  Target Ranges:  Prepandial:   less than 140 mg/dL      Peak postprandial:   less than 180 mg/dL (1-2 hours)      Critically ill patients:  140 - 180 mg/dL   Results for NOELA, BROTHERS (MRN [id number]) as of 09/05/2016 10:51  Ref. Range 09/04/2016 12:52 09/04/2016 19:45 09/04/2016 20:49 09/05/2016 07:54  Glucose-Capillary Latest Ref Range: 65 - 99 mg/dL 300 (H) 188 (H) 171 (H) 185 (H)   Review of Glycemic Control  Diabetes history: DM1 Outpatient Diabetes medications: Lantus 30 units QAM Current orders for Inpatient glycemic control: Lantus 30 units QAM, Novolog 0-9 units TID with meals  Inpatient Diabetes Program Recommendations: Correction (SSI): Please consider ordering Novolog 0-5 units QHS for bedtime correction scale. Insulin - Meal Coverage: Once diet is advanced and if patient is eating at least 50% of meals, please consider ordering Novolog for meal coverage (in addition to Novolog correction).  Thanks, Barnie Alderman, RN, MSN, CDE Diabetes Coordinator Inpatient Diabetes Program [phone] (Team Pager from 8am to 5pm)

## 2016-09-06 ENCOUNTER — Inpatient Hospital Stay (HOSPITAL_COMMUNITY): Payer: BLUE CROSS/BLUE SHIELD

## 2016-09-06 DIAGNOSIS — N189 Chronic kidney disease, unspecified: Secondary | ICD-10-CM

## 2016-09-06 DIAGNOSIS — N185 Chronic kidney disease, stage 5: Secondary | ICD-10-CM

## 2016-09-06 DIAGNOSIS — K3184 Gastroparesis: Secondary | ICD-10-CM

## 2016-09-06 DIAGNOSIS — G500 Trigeminal neuralgia: Secondary | ICD-10-CM

## 2016-09-06 DIAGNOSIS — E1022 Type 1 diabetes mellitus with diabetic chronic kidney disease: Secondary | ICD-10-CM

## 2016-09-06 LAB — GLUCOSE, CAPILLARY
GLUCOSE-CAPILLARY: 148 mg/dL — AB (ref 65–99)
GLUCOSE-CAPILLARY: 162 mg/dL — AB (ref 65–99)
Glucose-Capillary: 75 mg/dL (ref 65–99)
Glucose-Capillary: 122 mg/dL — ABNORMAL HIGH (ref 65–99)

## 2016-09-06 LAB — CBC
HEMATOCRIT: 23.3 % — AB (ref 36.0–46.0)
HEMOGLOBIN: 7.5 g/dL — AB (ref 12.0–15.0)
MCH: 28.8 pg (ref 26.0–34.0)
MCHC: 32.2 g/dL (ref 30.0–36.0)
MCV: 89.6 fL (ref 78.0–100.0)
Platelets: 256 10*3/uL (ref 150–400)
RBC: 2.6 MIL/uL — AB (ref 3.87–5.11)
RDW: 13.4 % (ref 11.5–15.5)
WBC: 5.4 10*3/uL (ref 4.0–10.5)

## 2016-09-06 LAB — BASIC METABOLIC PANEL
ANION GAP: 7 (ref 5–15)
BUN: 33 mg/dL — ABNORMAL HIGH (ref 6–20)
CALCIUM: 8.4 mg/dL — AB (ref 8.9–10.3)
CO2: 24 mmol/L (ref 22–32)
Chloride: 106 mmol/L (ref 101–111)
Creatinine, Ser: 6.69 mg/dL — ABNORMAL HIGH (ref 0.44–1.00)
GFR calc non Af Amer: 7 mL/min — ABNORMAL LOW (ref >60)
GFR, EST AFRICAN AMERICAN: 9 mL/min — AB (ref >60)
Glucose, Bld: 65 mg/dL (ref 65–99)
POTASSIUM: 3.8 mmol/L (ref 3.5–5.1)
Sodium: 137 mmol/L (ref 135–145)

## 2016-09-06 LAB — HEMOGLOBIN A1C
Hgb A1c MFr Bld: 6.8 % — ABNORMAL HIGH (ref 4.8–5.6)
Mean Plasma Glucose: 148 mg/dL

## 2016-09-06 MED ORDER — CEFAZOLIN SODIUM-DEXTROSE 2-4 GM/100ML-% IV SOLN
2.0000 g | INTRAVENOUS | Status: AC
  Administered 2016-09-07: 2 g via INTRAVENOUS
  Filled 2016-09-06: qty 100

## 2016-09-06 MED ORDER — INSULIN GLARGINE 100 UNIT/ML ~~LOC~~ SOLN
28.0000 [IU] | Freq: Every morning | SUBCUTANEOUS | Status: DC
  Administered 2016-09-07: 28 [IU] via SUBCUTANEOUS
  Filled 2016-09-06 (×2): qty 0.28

## 2016-09-06 MED ORDER — INSULIN ASPART 100 UNIT/ML ~~LOC~~ SOLN
3.0000 [IU] | Freq: Three times a day (TID) | SUBCUTANEOUS | Status: DC
  Administered 2016-09-06 – 2016-09-12 (×14): 3 [IU] via SUBCUTANEOUS

## 2016-09-06 NOTE — Anesthesia Preprocedure Evaluation (Addendum)
Anesthesia Evaluation  Patient identified by MRN, date of birth, ID band Patient awake    Reviewed: Allergy & Precautions, H&P , NPO status , Patient's Chart, lab work & pertinent test results  Airway Mallampati: II  TM Distance: >3 FB Neck ROM: Full    Dental no notable dental hx. (+) Teeth Intact, Dental Advisory Given   Pulmonary neg pulmonary ROS,    Pulmonary exam normal breath sounds clear to auscultation       Cardiovascular Exercise Tolerance: Good hypertension, Pt. on medications  Rhythm:Regular Rate:Normal     Neuro/Psych  Headaches, negative psych ROS   GI/Hepatic negative GI ROS, Neg liver ROS,   Endo/Other  diabetes, Insulin Dependent  Renal/GU ESRF and DialysisRenal disease  negative genitourinary   Musculoskeletal   Abdominal   Peds  Hematology negative hematology ROS (+)   Anesthesia Other Findings   Reproductive/Obstetrics negative OB ROS                            Anesthesia Physical Anesthesia Plan  ASA: III  Anesthesia Plan: MAC   Post-op Pain Management:    Induction: Intravenous  Airway Management Planned: Simple Face Mask  Additional Equipment:   Intra-op Plan:   Post-operative Plan:   Informed Consent: I have reviewed the patients History and Physical, chart, labs and discussed the procedure including the risks, benefits and alternatives for the proposed anesthesia with the patient or authorized representative who has indicated his/her understanding and acceptance.   Dental advisory given  Plan Discussed with: CRNA  Anesthesia Plan Comments:         Anesthesia Quick Evaluation

## 2016-09-06 NOTE — Progress Notes (Signed)
[facility] ROUNDING NOTE   Subjective:   Interval History:HPI: Carrie Paulson Cookis a 36 y.o.femalewith medical history significant of stage V chronic kidney disease, type 1 diabetes, trigeminal neuralgia, peripheral neuropathy who presented to the hospital (third visit this month) for evaluation of the above-noted complaints. Per patient, she has a known history of gastroparesis-and was maintained on Reglan for approximately 1 year. Creatinine 1/18  2.5 --- 5.6 --- 7  Greater than 300 mg proteinuria   CT abdomen no hydronephrosis. She has vien mapping planned Objective:  Vital signs in last 24 hours:  Temp:  [98.1 F (36.7 C)-98.6 F (37 C)] 98.6 F (37 C) (05/23 0504) Pulse Rate:  [84-89] 84 (05/23 0504) Resp:  [16-18] 18 (05/23 0504) BP: (135-149)/(74-92) 136/78 (05/23 0504) SpO2:  [100 %] 100 % (05/23 0504) Weight:  [191 lb 11.2 oz (87 kg)] 191 lb 11.2 oz (87 kg) (05/22 2123)  Weight change: 3 lb 11.2 oz (1.678 kg) Filed Weights   09/04/16 1235 09/04/16 2048 09/05/16 2123  Weight: 188 lb (85.3 kg) 186 lb 8.2 oz (84.6 kg) 191 lb 11.2 oz (87 kg)    Intake/Output: I/O last 3 completed shifts: In: 3067.5 [P.O.:1180; I.V.:1887.5] Out: 900 [Urine:900]   Intake/Output this shift:  No intake/output data recorded.  CVS- RRR RS- CTA ABD- BS present soft non-distended EXT- no edema   Basic Metabolic Panel:  Recent Labs Lab 08/31/16 1031 09/04/16 1243 09/04/16 2106 09/05/16 0705 09/06/16 0614  NA 138 135  --  137 137  K 4.1 4.7  --  4.2 3.8  CL 103 101  --  108 106  CO2 25 23  --  23 24  GLUCOSE 245* 302*  --  205* 65  BUN 44* 48*  --  43* 33*  CREATININE 6.46* 6.92* 6.75* 7.09* 6.69*  CALCIUM 9.3 9.2  --  8.5* 8.4*  PHOS  --   --   --  3.9  --     Liver Function Tests:  Recent Labs Lab 08/31/16 1031 09/04/16 1243 09/05/16 0705  AST 26 20  --   ALT 20 19  --   ALKPHOS 69 69  --   BILITOT 0.6 0.4  --   PROT 7.5 7.2  --   ALBUMIN 3.8 3.9  3.0*    Recent Labs Lab 08/31/16 1031 09/04/16 1243  LIPASE 22 24   No results for input(s): AMMONIA in the last 168 hours.  CBC:  Recent Labs Lab 08/31/16 1031 09/04/16 1243 09/04/16 2106 09/05/16 0705 09/06/16 0614  WBC 6.1 7.2 6.3 6.4 5.4  NEUTROABS 4.2  --   --   --   --   HGB 8.8* 8.9* 8.4* 7.8* 7.5*  HCT 26.0* 26.7* 25.7* 24.3* 23.3*  MCV 88.7 88.7 88.9 90.3 89.6  PLT 315 315 281 265 256    Cardiac Enzymes: No results for input(s): CKTOTAL, CKMB, CKMBINDEX, TROPONINI in the last 168 hours.  BNP: Invalid input(s): POCBNP  CBG:  Recent Labs Lab 09/05/16 0754 09/05/16 1229 09/05/16 1653 09/05/16 2125 09/06/16 0754  GLUCAP 185* 178* 220* 157* 75    Microbiology: Results for orders placed or performed during the hospital encounter of 04/12/14  Wound culture     Status: None   Collection Time: 04/12/14  6:23 PM  Result Value Ref Range Status   Specimen Description FOOT RIGHT  Final   Special Requests NONE  Final   Gram Stain   Final    NO WBC SEEN RARE  SQUAMOUS EPITHELIAL CELLS PRESENT NO ORGANISMS SEEN Performed at [facility]    Culture   Final    MODERATE METHICILLIN RESISTANT STAPHYLOCOCCUS AUREUS Note: RIFAMPIN AND GENTAMICIN SHOULD NOT BE USED AS SINGLE DRUGS FOR TREATMENT OF STAPH INFECTIONS. This organism DOES NOT demonstrate inducible Clindamycin resistance in vitro. CRITICAL RESULT CALLED TO, READ BACK BY AND VERIFIED WITH: LYNN M 12/30 @900   BY REAMM Performed at [facility]    Report Status 04/15/2014 FINAL  Final   Organism ID, Bacteria METHICILLIN RESISTANT STAPHYLOCOCCUS AUREUS  Final      Susceptibility   Methicillin resistant staphylococcus aureus - MIC*    CLINDAMYCIN <=0.25 SENSITIVE Sensitive     ERYTHROMYCIN >=8 RESISTANT Resistant     GENTAMICIN <=0.5 SENSITIVE Sensitive     LEVOFLOXACIN 4 INTERMEDIATE Intermediate     OXACILLIN >=4 RESISTANT Resistant     PENICILLIN >=0.5 RESISTANT Resistant      RIFAMPIN <=0.5 SENSITIVE Sensitive     TRIMETH/SULFA <=10 SENSITIVE Sensitive     VANCOMYCIN 1 SENSITIVE Sensitive     TETRACYCLINE <=1 SENSITIVE Sensitive     * MODERATE METHICILLIN RESISTANT STAPHYLOCOCCUS AUREUS    Coagulation Studies: No results for input(s): LABPROT, INR in the last 72 hours.  Urinalysis:  Recent Labs  09/04/16 1301  COLORURINE YELLOW  LABSPEC 1.014  PHURINE 6.0  GLUCOSEU >=500*  HGBUR NEGATIVE  BILIRUBINUR NEGATIVE  KETONESUR NEGATIVE  PROTEINUR >=300*  NITRITE NEGATIVE  LEUKOCYTESUR NEGATIVE      Imaging: Ct Abdomen Pelvis Wo Contrast  Result Date: 09/04/2016 CLINICAL DATA:  LEFT lower quadrant pain for 2-4 weeks EXAM: CT ABDOMEN AND PELVIS WITHOUT CONTRAST TECHNIQUE: Multidetector CT imaging of the abdomen and pelvis was performed following the standard protocol without IV contrast. COMPARISON:  None. FINDINGS: Lower chest: Lung bases are clear. Hepatobiliary: No focal hepatic lesion. No biliary duct dilatation. Gallbladder is normal. Common bile duct is normal. Pancreas: Pancreas is normal. No ductal dilatation. No pancreatic inflammation. Spleen: Normal spleen Adrenals/urinary tract: Adrenal glands and kidneys are normal. No ureterolithiasis or obstructive uropathy. Bladder normal Stomach/Bowel: Stomach, small-bowel appendix and cecum normal. There is moderate volume stool throughout the colon. Descending colon and rectosigmoid colon normal. Vascular/Lymphatic: Abdominal aorta is normal caliber. There is no retroperitoneal or periportal lymphadenopathy. No pelvic lymphadenopathy. Reproductive: Uterus and ribs normal. Other: The inguinal hernia. Small pleural hernias fat filled measuring 15 mm by 19 mm pre Musculoskeletal: No aggressive osseous lesion. IMPRESSION: 1. No explanation for LEFT lower quadrant pain. 2. No inguinal hernia.  Small umbilical hernia. 3. No nephrolithiasis or obstructive uropathy. Electronically Signed   By: Suzy Bouchard M.D.   On:  09/04/2016 14:57     Medications:    . amLODipine  10 mg Oral Daily  . bisacodyl  10 mg Rectal Daily  . carbamazepine  200 mg Oral QPM  . darbepoetin (ARANESP) injection - NON-DIALYSIS  100 mcg Subcutaneous Q Tue-1800  . docusate sodium  200 mg Oral BID  . heparin  5,000 Units Subcutaneous Q8H  . hydrALAZINE  50 mg Oral TID  . insulin aspart  0-9 Units Subcutaneous TID WC  . insulin glargine  30 Units Subcutaneous q morning - 10a  . labetalol  400 mg Oral BID  . ondansetron (ZOFRAN) IV  4 mg Intravenous Q8H  . pantoprazole  40 mg Oral Daily  . polyethylene glycol  17 g Oral Daily  . 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 We shall proceed with preparations for dialysis and will have a permcath and vascular access studies  Appreciiate Dr Nicole Cella help  HTN  Appears labile will follow and will adjust antihypertensives   Diabetes controlled with insulin  Bones  Check PTH  Anemia check iron and will add aranesp      LOS: 2 Brydon Spahr W @TODAY @8 :19 AM

## 2016-09-06 NOTE — Progress Notes (Signed)
PROGRESS NOTE    Carrie [name]  IZT:[id number] DOB: [birth year] DOA: 09/04/2016 PCP: Annetta Maw, MD   Brief Narrative: 36 y.o.femalewith medical history significant of stage V chronic kidney disease, type 1 diabetes, trigeminal neuralgia, peripheral neuropathy who presented to the hospital (third visit this month) for evaluation of the nausea vomiting and abdominal pain. Per patient, she has a known history of gastroparesis-and was maintained on Reglan for approximately 1 year. Approximately 2 months back, Reglan was discontinued by her primary nephrologist and primary care practitioner due to concerns of early tardive dyskinesia. She then started having intermittent nausea and vomiting, that has gradually worsened, workup in the ER showed CT scan with moderate stool burden but no acute findings, creatinine close to 7, she was admitted for persistent nausea vomiting, now likely ESRD with possible dialysis need.  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 Nephrology is planning to initiate renal replacement therapy in the hospital. Continue to monitor BMP, blood pressure. Consultants appreciated.  #Diabetic gastroparesis with chronic nausea and vomiting: Unable to tolerate Reglan due to side effect of tardive dyskinesia. CT scan of abdomen and pelvis with stone burden. Patient reported better with bowel movement. Denied nausea vomiting or abdominal pain. Continue supportive care and bowel regimen.  #Hypertension: Continue to monitor blood pressure. Continue Norvasc, hydralazine, labetalol. May need to taper down blood pressure medication after initiation of hemodialysis treatment.  #Anemia of chronic kidney disease: Defer to nephrology for IV iron and PSA treatment during dialysis.  #History of trigeminal neuralgia: Continue Tegretol  #Type 1  diabetes with Diabetic peripheral neuropathy: Patient with low blood sugar level in the morning. Lower the dose of Lantus and added insulin before meals. Continue to monitor blood sugar level. A1c 6.8.  #Acid reflux: Continue Protonix. 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 Denied headache, dizziness, vomiting, chest pain, shortness of breath. Still has mild nausea.  Objective: Vitals:   09/05/16 1726 09/05/16 2123 09/06/16 0504 09/06/16 0922  BP: 135/81 (!) 149/92 136/78 122/82  Pulse: 86 86 84 85  Resp: 17 18 18 18   Temp: 98.2 F (36.8 C) 98.5 F (36.9 C) 98.6 F (37 C) 98.2 F (36.8 C)  TempSrc: Oral Oral Oral Oral  SpO2: 100% 100% 100% 100%  Weight:  87 kg (191 lb 11.2 oz)    Height:        Intake/Output Summary (Last 24 hours) at 09/06/16 1324 Last data filed at 09/06/16 0900  Gross per 24 hour  Intake          1138.75 ml  Output                0 ml  Net          1138.75 ml   Filed Weights   09/04/16 1235 09/04/16 2048 09/05/16 2123  Weight: 85.3 kg (188 lb) 84.6 kg (186 lb 8.2 oz) 87 kg (191 lb 11.2 oz)    Examination:  General  exam: Appears calm and comfortable  Respiratory system: Clear to auscultation. Respiratory effort normal. No wheezing or crackle Cardiovascular system: S1 & S2 heard, RRR.  No pedal edema. Gastrointestinal system: Abdomen is nondistended, soft and nontender. Normal bowel sounds heard. Central nervous system: Alert and oriented. No focal neurological deficits. Extremities: Symmetric 5 x 5 power. Skin: No  rashes, lesions or ulcers Psychiatry: Judgement and insight appear normal. Mood & affect appropriate.     Data Reviewed: I have personally reviewed following labs and imaging studies  CBC:  Recent Labs Lab 08/31/16 1031 09/04/16 1243 09/04/16 2106 09/05/16 0705 09/06/16 0614  WBC 6.1 7.2 6.3 6.4 5.4  NEUTROABS 4.2  --   --   --   --   HGB 8.8* 8.9* 8.4* 7.8* 7.5*  HCT 26.0* 26.7* 25.7* 24.3* 23.3*  MCV 88.7 88.7 88.9 90.3 89.6  PLT 315 315 281 265 161   Basic Metabolic Panel:  Recent Labs Lab 08/31/16 1031 09/04/16 1243 09/04/16 2106 09/05/16 0705 09/06/16 0614  NA 138 135  --  137 137  K 4.1 4.7  --  4.2 3.8  CL 103 101  --  108 106  CO2 25 23  --  23 24  GLUCOSE 245* 302*  --  205* 65  BUN 44* 48*  --  43* 33*  CREATININE 6.46* 6.92* 6.75* 7.09* 6.69*  CALCIUM 9.3 9.2  --  8.5* 8.4*  PHOS  --   --   --  3.9  --    GFR: Estimated Creatinine Clearance: 13.8 mL/min (A) (by C-G formula based on SCr of 6.69 mg/dL (H)). Liver Function Tests:  Recent Labs Lab 08/31/16 1031 09/04/16 1243 09/05/16 0705  AST 26 20  --   ALT 20 19  --   ALKPHOS 69 69  --   BILITOT 0.6 0.4  --   PROT 7.5 7.2  --   ALBUMIN 3.8 3.9 3.0*    Recent Labs Lab 08/31/16 1031 09/04/16 1243  LIPASE 22 24   No results for input(s): AMMONIA in the last 168 hours. Coagulation Profile: No results for input(s): INR, PROTIME in the last 168 hours. Cardiac Enzymes: No results for input(s): CKTOTAL, CKMB, CKMBINDEX, TROPONINI in the last 168 hours. BNP (last 3 results) No results for input(s): PROBNP in the last 8760 hours. HbA1C:  Recent Labs  09/04/16 2106  HGBA1C 6.8*   CBG:  Recent Labs Lab 09/05/16 1229 09/05/16 1653 09/05/16 2125 09/06/16 0754 09/06/16 1142  GLUCAP 178* 220* 157* 75 148*   Lipid Profile: No results for input(s): CHOL, HDL, LDLCALC, TRIG, CHOLHDL, LDLDIRECT in the last 72 hours. Thyroid Function Tests: No results for input(s): TSH, T4TOTAL,  FREET4, T3FREE, THYROIDAB in the last 72 hours. Anemia Panel:  Recent Labs  09/05/16 1115  TIBC 235*  IRON 59   Sepsis Labs: No results for input(s): PROCALCITON, LATICACIDVEN in the last 168 hours.  No results found for this or any previous visit (from the past 240 hour(s)).       Radiology Studies: Ct Abdomen Pelvis Wo Contrast  Result Date: 09/04/2016 CLINICAL DATA:  LEFT lower quadrant pain for 2-4 weeks EXAM: CT ABDOMEN AND PELVIS WITHOUT CONTRAST TECHNIQUE: Multidetector CT imaging of the abdomen and pelvis was performed following the standard protocol without IV contrast. COMPARISON:  None. FINDINGS: Lower chest: Lung bases are clear. Hepatobiliary: No focal hepatic lesion. No biliary duct dilatation. Gallbladder is normal. Common bile duct is normal. Pancreas: Pancreas is normal. No ductal dilatation.  No pancreatic inflammation. Spleen: Normal spleen Adrenals/urinary tract: Adrenal glands and kidneys are normal. No ureterolithiasis or obstructive uropathy. Bladder normal Stomach/Bowel: Stomach, small-bowel appendix and cecum normal. There is moderate volume stool throughout the colon. Descending colon and rectosigmoid colon normal. Vascular/Lymphatic: Abdominal aorta is normal caliber. There is no retroperitoneal or periportal lymphadenopathy. No pelvic lymphadenopathy. Reproductive: Uterus and ribs normal. Other: The inguinal hernia. Small pleural hernias fat filled measuring 15 mm by 19 mm pre Musculoskeletal: No aggressive osseous lesion. IMPRESSION: 1. No explanation for LEFT lower quadrant pain. 2. No inguinal hernia.  Small umbilical hernia. 3. No nephrolithiasis or obstructive uropathy. Electronically Signed   By: Suzy Bouchard M.D.   On: 09/04/2016 14:57        Scheduled Meds: . amLODipine  10 mg Oral Daily  . bisacodyl  10 mg Rectal Daily  . carbamazepine  200 mg Oral QPM  . darbepoetin (ARANESP) injection - NON-DIALYSIS  100 mcg Subcutaneous Q Tue-1800  .  docusate sodium  200 mg Oral BID  . heparin  5,000 Units Subcutaneous Q8H  . hydrALAZINE  50 mg Oral TID  . insulin aspart  0-9 Units Subcutaneous TID WC  . insulin aspart  3 Units Subcutaneous TID WC  . [START ON 09/07/2016] insulin glargine  28 Units Subcutaneous q morning - 10a  . labetalol  400 mg Oral BID  . ondansetron (ZOFRAN) IV  4 mg Intravenous Q8H  . pantoprazole  40 mg Oral Daily  . polyethylene glycol  17 g Oral Daily  . sevelamer carbonate  800 mg Oral TID WC   Continuous Infusions: . [START ON 09/07/2016]  ceFAZolin (ANCEF) IV       LOS: 2 days    Natayah Warmack Tanna Furry, MD [facility] Pager [phone]  If 7PM-7AM, please contact night-coverage [email or web address] Password [email or web address] 09/06/2016, 1:24 PM

## 2016-09-06 NOTE — Progress Notes (Signed)
Patient refuses bed alarm and safety plan. Instructed to use call bell  

## 2016-09-06 NOTE — Progress Notes (Signed)
Upper Extremity Vein Map    Right Cephalic  Segment Diameter Depth Comment  1. Axilla 3.30mm mm   2. Mid upper arm 2.66mm 5.25mm thrombus  3. Above [facility] 1.41mm 4.75mm Thrombus, branch  4. In [facility] 1.60mm mm branch  5. Below AC 1.18mm mm   6. Mid forearm 1.26mm mm   7. Wrist mm mm    mm mm    mm mm    mm mm    Right Basilic  Segment Diameter Depth Comment  1. Axilla mm mm   2. Mid upper arm 2.83mm 15.75mm   3. Above [facility] 3.24mm 17.22mm   4. In [facility] 3.83mm 20.66mm   5. Below AC mm mm   6. Mid forearm mm mm   7. Wrist mm mm    mm mm    mm mm    mm mm    Left Cephalic  Segment Diameter Depth Comment  1. Axilla 1.37mm 11.8mm   2. Mid upper arm 2.15mm 4.17mm   3. Above AC 2.69mm 3.68mm   4. In AC 3.6mm mm   5. Below AC 3.40mm 6.75mm branch  6. Mid forearm 1.33mm 7.35mm   7. Wrist 1.49mm 4.60mm    mm mm    mm mm    mm mm    Left Basilic  Segment Diameter Depth Comment  1. Axilla mm mm   2. Mid upper arm 2.97mm 19.86mm   3. Above [employer] 2.75mm 15.87mm branch  4. In [facility] 2.3mm 12.60mm   5. Below AC 2.47mm mm   6. Mid forearm mm mm   7. Wrist mm mm    mm mm    mm mm    mm mm     Landry Mellow, RDMS, RVT 09/06/2016

## 2016-09-06 NOTE — Progress Notes (Signed)
Inpatient Diabetes Program Recommendations  AACE/ADA: New Consensus Statement on Inpatient Glycemic Control (2015)  Target Ranges:  Prepandial:   less than 140 mg/dL      Peak postprandial:   less than 180 mg/dL (1-2 hours)      Critically ill patients:  140 - 180 mg/dL   Results for Carrie [name], Carrie [name] (MRN [id number]) as of 09/06/2016 11:12  Ref. Range 09/05/2016 07:54 09/05/2016 12:29 09/05/2016 16:53 09/05/2016 21:25 09/06/2016 07:54  Glucose-Capillary Latest Ref Range: 65 - 99 mg/dL 185 (H) 178 (H) 220 (H) 157 (H) 75   Review of Glycemic Control  Diabetes history: DM1 Outpatient Diabetes medications: Lantus 30 units QAM Current orders for Inpatient glycemic control: Lantus 30 units QAM, Novolog 0-9 units TID with meals  Inpatient Diabetes Program Recommendations: Insulin - Basal: Fasting glucose 75 mg/dl this morning. Please consider decreasing Lantus to 28 units QAM. Correction (SSI): Please consider ordering Novolog 0-5 units QHS for bedtime correction scale. Insulin - Meal Coverage: Please consider ordering Novolog 3 units TID with meals for meal coverage if patient eats at least 50% of meals.  Thanks, Barnie Alderman, RN, MSN, CDE Diabetes Coordinator Inpatient Diabetes Program [phone] (Team Pager from 8am to 5pm)

## 2016-09-07 ENCOUNTER — Inpatient Hospital Stay (HOSPITAL_COMMUNITY): Payer: BLUE CROSS/BLUE SHIELD | Admitting: Anesthesiology

## 2016-09-07 ENCOUNTER — Encounter (HOSPITAL_COMMUNITY)
Admission: EM | Disposition: A | Discharge status: Home / Self Care | Payer: Self-pay | Source: Home / Self Care | Attending: Nephrology

## 2016-09-07 ENCOUNTER — Inpatient Hospital Stay (HOSPITAL_COMMUNITY): Payer: BLUE CROSS/BLUE SHIELD

## 2016-09-07 ENCOUNTER — Telehealth: Payer: Self-pay | Admitting: Surgery

## 2016-09-07 ENCOUNTER — Encounter (HOSPITAL_COMMUNITY): Payer: Self-pay | Admitting: Certified Registered Nurse Anesthetist

## 2016-09-07 DIAGNOSIS — E1065 Type 1 diabetes mellitus with hyperglycemia: Secondary | ICD-10-CM

## 2016-09-07 DIAGNOSIS — Z992 Dependence on renal dialysis: Secondary | ICD-10-CM

## 2016-09-07 DIAGNOSIS — N186 End stage renal disease: Secondary | ICD-10-CM

## 2016-09-07 HISTORY — PX: INSERTION OF DIALYSIS CATHETER: SHX1324

## 2016-09-07 HISTORY — PX: AV FISTULA PLACEMENT: SHX1204

## 2016-09-07 LAB — RENAL FUNCTION PANEL
Albumin: 2.9 g/dL — ABNORMAL LOW (ref 3.5–5.0)
Anion gap: 10 (ref 5–15)
BUN: 35 mg/dL — ABNORMAL HIGH (ref 6–20)
CO2: 19 mmol/L — AB (ref 22–32)
CREATININE: 6.92 mg/dL — AB (ref 0.44–1.00)
Calcium: 8.4 mg/dL — ABNORMAL LOW (ref 8.9–10.3)
Chloride: 108 mmol/L (ref 101–111)
GFR calc non Af Amer: 7 mL/min — ABNORMAL LOW (ref >60)
GFR, EST AFRICAN AMERICAN: 8 mL/min — AB (ref >60)
Glucose, Bld: 169 mg/dL — ABNORMAL HIGH (ref 65–99)
Phosphorus: 3.9 mg/dL (ref 2.5–4.6)
Potassium: 4.6 mmol/L (ref 3.5–5.1)
Sodium: 137 mmol/L (ref 135–145)

## 2016-09-07 LAB — BASIC METABOLIC PANEL
Anion gap: 7 (ref 5–15)
BUN: 36 mg/dL — AB (ref 6–20)
CO2: 22 mmol/L (ref 22–32)
Calcium: 8.5 mg/dL — ABNORMAL LOW (ref 8.9–10.3)
Chloride: 107 mmol/L (ref 101–111)
Creatinine, Ser: 7.07 mg/dL — ABNORMAL HIGH (ref 0.44–1.00)
GFR calc Af Amer: 8 mL/min — ABNORMAL LOW (ref >60)
GFR, EST NON AFRICAN AMERICAN: 7 mL/min — AB (ref >60)
GLUCOSE: 126 mg/dL — AB (ref 65–99)
POTASSIUM: 4.8 mmol/L (ref 3.5–5.1)
Sodium: 136 mmol/L (ref 135–145)

## 2016-09-07 LAB — CBC
HCT: 24.9 % — ABNORMAL LOW (ref 36.0–46.0)
HCT: 25.1 % — ABNORMAL LOW (ref 36.0–46.0)
Hemoglobin: 7.9 g/dL — ABNORMAL LOW (ref 12.0–15.0)
Hemoglobin: 8 g/dL — ABNORMAL LOW (ref 12.0–15.0)
MCH: 29.2 pg (ref 26.0–34.0)
MCH: 29 pg (ref 26.0–34.0)
MCHC: 31.7 g/dL (ref 30.0–36.0)
MCHC: 31.9 g/dL (ref 30.0–36.0)
MCV: 91.9 fL (ref 78.0–100.0)
MCV: 90.9 fL (ref 78.0–100.0)
PLATELETS: 283 10*3/uL (ref 150–400)
PLATELETS: 241 10*3/uL (ref 150–400)
RBC: 2.71 MIL/uL — AB (ref 3.87–5.11)
RBC: 2.76 MIL/uL — AB (ref 3.87–5.11)
RDW: 14.2 % (ref 11.5–15.5)
RDW: 13.7 % (ref 11.5–15.5)
WBC: 6 10*3/uL (ref 4.0–10.5)
WBC: 6.2 10*3/uL (ref 4.0–10.5)

## 2016-09-07 LAB — ALT: ALT: 15 U/L (ref 14–54)

## 2016-09-07 LAB — GLUCOSE, CAPILLARY
GLUCOSE-CAPILLARY: 171 mg/dL — AB (ref 65–99)
GLUCOSE-CAPILLARY: 122 mg/dL — AB (ref 65–99)
Glucose-Capillary: 148 mg/dL — ABNORMAL HIGH (ref 65–99)
Glucose-Capillary: 182 mg/dL — ABNORMAL HIGH (ref 65–99)

## 2016-09-07 LAB — PROTIME-INR
INR: 0.98
PROTHROMBIN TIME: 13 s (ref 11.4–15.2)

## 2016-09-07 LAB — SURGICAL PCR SCREEN
MRSA, PCR: NEGATIVE
Staphylococcus aureus: NEGATIVE

## 2016-09-07 LAB — PARATHYROID HORMONE, INTACT (NO CA): PTH: 109 pg/mL — ABNORMAL HIGH (ref 15–65)

## 2016-09-07 SURGERY — ARTERIOVENOUS (AV) FISTULA CREATION
Anesthesia: Monitor Anesthesia Care | Site: Neck | Laterality: Right

## 2016-09-07 MED ORDER — FENTANYL CITRATE (PF) 100 MCG/2ML IJ SOLN
INTRAMUSCULAR | Status: DC | PRN
  Administered 2016-09-07 (×5): 25 ug via INTRAVENOUS

## 2016-09-07 MED ORDER — HEPARIN SODIUM (PORCINE) 1000 UNIT/ML IJ SOLN
INTRAMUSCULAR | Status: DC | PRN
  Administered 2016-09-07: 3000 [IU] via INTRAVENOUS

## 2016-09-07 MED ORDER — FENTANYL CITRATE (PF) 250 MCG/5ML IJ SOLN
INTRAMUSCULAR | Status: AC
  Filled 2016-09-07: qty 5

## 2016-09-07 MED ORDER — LIDOCAINE-EPINEPHRINE (PF) 1 %-1:200000 IJ SOLN
INTRAMUSCULAR | Status: AC
  Filled 2016-09-07: qty 30

## 2016-09-07 MED ORDER — OXYCODONE HCL 5 MG PO TABS
5.0000 mg | ORAL_TABLET | Freq: Four times a day (QID) | ORAL | Status: DC | PRN
  Administered 2016-09-07 – 2016-09-12 (×8): 5 mg via ORAL
  Filled 2016-09-07 (×8): qty 1

## 2016-09-07 MED ORDER — PROPOFOL 10 MG/ML IV BOLUS
INTRAVENOUS | Status: DC | PRN
  Administered 2016-09-07: 20 mg via INTRAVENOUS
  Administered 2016-09-07: 10 mg via INTRAVENOUS
  Administered 2016-09-07: 20 mg via INTRAVENOUS
  Administered 2016-09-07: 10 mg via INTRAVENOUS
  Administered 2016-09-07: 20 mg via INTRAVENOUS
  Administered 2016-09-07: 10 mg via INTRAVENOUS
  Administered 2016-09-07 (×4): 20 mg via INTRAVENOUS
  Administered 2016-09-07: 10 mg via INTRAVENOUS

## 2016-09-07 MED ORDER — PHENYLEPHRINE HCL 10 MG/ML IJ SOLN
INTRAMUSCULAR | Status: DC | PRN
  Administered 2016-09-07: 80 ug via INTRAVENOUS

## 2016-09-07 MED ORDER — LIDOCAINE HCL (CARDIAC) 20 MG/ML IV SOLN
INTRAVENOUS | Status: DC | PRN
  Administered 2016-09-07: 60 mg via INTRAVENOUS

## 2016-09-07 MED ORDER — SODIUM CHLORIDE 0.9 % IV SOLN
INTRAVENOUS | Status: DC | PRN
  Administered 2016-09-07: 500 mL

## 2016-09-07 MED ORDER — GABAPENTIN 600 MG PO TABS
300.0000 mg | ORAL_TABLET | Freq: Every day | ORAL | Status: DC
  Administered 2016-09-07 – 2016-09-11 (×5): 300 mg via ORAL
  Filled 2016-09-07 (×5): qty 1

## 2016-09-07 MED ORDER — HEPARIN SODIUM (PORCINE) 1000 UNIT/ML IJ SOLN
INTRAMUSCULAR | Status: AC
  Filled 2016-09-07: qty 1

## 2016-09-07 MED ORDER — SODIUM CHLORIDE 0.9 % IV SOLN: 100.0000 mL | INTRAVENOUS | Status: DC | PRN

## 2016-09-07 MED ORDER — LIDOCAINE-EPINEPHRINE (PF) 1 %-1:200000 IJ SOLN
INTRAMUSCULAR | Status: DC | PRN
  Administered 2016-09-07: 22 mL

## 2016-09-07 MED ORDER — PENTAFLUOROPROP-TETRAFLUOROETH EX AERO: 1.0000 "application " | INHALATION_SPRAY | CUTANEOUS | Status: DC | PRN

## 2016-09-07 MED ORDER — MIDAZOLAM HCL 2 MG/2ML IJ SOLN
INTRAMUSCULAR | Status: AC
  Filled 2016-09-07: qty 2

## 2016-09-07 MED ORDER — HEMOSTATIC AGENTS (NO CHARGE) OPTIME
TOPICAL | Status: DC | PRN
  Administered 2016-09-07: 1 via TOPICAL

## 2016-09-07 MED ORDER — HEPARIN SODIUM (PORCINE) 1000 UNIT/ML DIALYSIS
1000.0000 [IU] | INTRAMUSCULAR | Status: DC | PRN
  Administered 2016-09-07: 1000 [IU] via INTRAVENOUS_CENTRAL

## 2016-09-07 MED ORDER — ONDANSETRON HCL 4 MG/2ML IJ SOLN
INTRAMUSCULAR | Status: AC
  Administered 2016-09-07: 4 mg via INTRAVENOUS
  Filled 2016-09-07: qty 2

## 2016-09-07 MED ORDER — ALTEPLASE 2 MG IJ SOLR: 2.0000 mg | Freq: Once | INTRAMUSCULAR | Status: DC | PRN

## 2016-09-07 MED ORDER — LIDOCAINE-PRILOCAINE 2.5-2.5 % EX CREA: 1.0000 "application " | TOPICAL_CREAM | CUTANEOUS | Status: DC | PRN

## 2016-09-07 MED ORDER — ONDANSETRON HCL 4 MG/2ML IJ SOLN
4.0000 mg | Freq: Once | INTRAMUSCULAR | Status: AC
  Administered 2016-09-07: 4 mg via INTRAVENOUS

## 2016-09-07 MED ORDER — HEPARIN SODIUM (PORCINE) 5000 UNIT/ML IJ SOLN
5000.0000 [IU] | Freq: Three times a day (TID) | INTRAMUSCULAR | Status: DC
  Administered 2016-09-07 – 2016-09-12 (×13): 5000 [IU] via SUBCUTANEOUS
  Filled 2016-09-07 (×16): qty 1

## 2016-09-07 MED ORDER — FENTANYL CITRATE (PF) 100 MCG/2ML IJ SOLN
25.0000 ug | INTRAMUSCULAR | Status: DC | PRN
  Administered 2016-09-07: 50 ug via INTRAVENOUS

## 2016-09-07 MED ORDER — FENTANYL CITRATE (PF) 100 MCG/2ML IJ SOLN
INTRAMUSCULAR | Status: AC
  Filled 2016-09-07: qty 2

## 2016-09-07 MED ORDER — 0.9 % SODIUM CHLORIDE (POUR BTL) OPTIME
TOPICAL | Status: DC | PRN
  Administered 2016-09-07: 1000 mL

## 2016-09-07 MED ORDER — LIDOCAINE HCL (PF) 1 % IJ SOLN
5.0000 mL | INTRAMUSCULAR | Status: DC | PRN
  Filled 2016-09-07: qty 5

## 2016-09-07 MED ORDER — PROPOFOL 500 MG/50ML IV EMUL
INTRAVENOUS | Status: DC | PRN
  Administered 2016-09-07: 125 ug/kg/min via INTRAVENOUS
  Administered 2016-09-07: 50 ug/kg/min via INTRAVENOUS

## 2016-09-07 MED ORDER — PROPOFOL 10 MG/ML IV BOLUS
INTRAVENOUS | Status: AC
  Filled 2016-09-07: qty 40

## 2016-09-07 MED ORDER — SODIUM CHLORIDE 0.9 % IV SOLN
INTRAVENOUS | Status: DC | PRN
  Administered 2016-09-07: 07:00:00 via INTRAVENOUS

## 2016-09-07 MED ORDER — PROTAMINE SULFATE 10 MG/ML IV SOLN
INTRAVENOUS | Status: AC
  Filled 2016-09-07: qty 5

## 2016-09-07 MED ORDER — HEPARIN SODIUM (PORCINE) 1000 UNIT/ML IJ SOLN
INTRAMUSCULAR | Status: DC | PRN
  Administered 2016-09-07: 1000 [IU]

## 2016-09-07 MED ORDER — PROTAMINE SULFATE 10 MG/ML IV SOLN
INTRAVENOUS | Status: DC | PRN
  Administered 2016-09-07: 5 mg via INTRAVENOUS
  Administered 2016-09-07 (×2): 10 mg via INTRAVENOUS

## 2016-09-07 SURGICAL SUPPLY — 69 items
ADH SKN CLS APL DERMABOND .7 (GAUZE/BANDAGES/DRESSINGS) ×4
ARMBAND PINK RESTRICT EXTREMIT (MISCELLANEOUS) ×8 IMPLANT
BAG DECANTER FOR FLEXI CONT (MISCELLANEOUS) ×4 IMPLANT
BIOPATCH RED 1 DISK 7.0 (GAUZE/BANDAGES/DRESSINGS) ×3 IMPLANT
BIOPATCH RED 1IN DISK 7.0MM (GAUZE/BANDAGES/DRESSINGS) ×1
CANISTER SUCT 3000ML PPV (MISCELLANEOUS) ×4 IMPLANT
CATH PALINDROME RT-P 15FX19CM (CATHETERS) IMPLANT
CATH PALINDROME RT-P 15FX23CM (CATHETERS) ×2 IMPLANT
CATH PALINDROME RT-P 15FX28CM (CATHETERS) IMPLANT
CATH PALINDROME RT-P 15FX55CM (CATHETERS) IMPLANT
CLIP TI MEDIUM 6 (CLIP) ×4 IMPLANT
CLIP TI WIDE RED SMALL 6 (CLIP) ×4 IMPLANT
COVER PROBE W GEL 5X96 (DRAPES) ×6 IMPLANT
COVER SURGICAL LIGHT HANDLE (MISCELLANEOUS) ×4 IMPLANT
DECANTER SPIKE VIAL GLASS SM (MISCELLANEOUS) ×2 IMPLANT
DERMABOND ADVANCED (GAUZE/BANDAGES/DRESSINGS) ×4
DERMABOND ADVANCED .7 DNX12 (GAUZE/BANDAGES/DRESSINGS) ×2 IMPLANT
DRAPE C-ARM 42X72 X-RAY (DRAPES) ×4 IMPLANT
DRAPE CHEST BREAST 15X10 FENES (DRAPES) ×4 IMPLANT
ELECT REM PT RETURN 9FT ADLT (ELECTROSURGICAL) ×4
ELECTRODE REM PT RTRN 9FT ADLT (ELECTROSURGICAL) ×2 IMPLANT
GAUZE SPONGE 4X4 12PLY STRL LF (GAUZE/BANDAGES/DRESSINGS) ×2 IMPLANT
GAUZE SPONGE 4X4 16PLY XRAY LF (GAUZE/BANDAGES/DRESSINGS) ×4 IMPLANT
GLOVE BIO SURGEON STRL SZ 6.5 (GLOVE) ×2 IMPLANT
GLOVE BIO SURGEONS STRL SZ 6.5 (GLOVE) ×2
GLOVE BIOGEL PI IND STRL 6.5 (GLOVE) IMPLANT
GLOVE BIOGEL PI IND STRL 7.5 (GLOVE) ×2 IMPLANT
GLOVE BIOGEL PI INDICATOR 6.5 (GLOVE) ×10
GLOVE BIOGEL PI INDICATOR 7.5 (GLOVE) ×2
GLOVE ECLIPSE 6.5 STRL STRAW (GLOVE) ×4 IMPLANT
GLOVE SURG SS PI 7.5 STRL IVOR (GLOVE) ×4 IMPLANT
GOWN STRL REUS W/ TWL LRG LVL3 (GOWN DISPOSABLE) ×4 IMPLANT
GOWN STRL REUS W/ TWL XL LVL3 (GOWN DISPOSABLE) ×2 IMPLANT
GOWN STRL REUS W/TWL LRG LVL3 (GOWN DISPOSABLE) ×8
GOWN STRL REUS W/TWL XL LVL3 (GOWN DISPOSABLE) ×8
HEMOSTAT SNOW SURGICEL 2X4 (HEMOSTASIS) IMPLANT
KIT BASIN OR (CUSTOM PROCEDURE TRAY) ×4 IMPLANT
KIT ROOM TURNOVER OR (KITS) ×4 IMPLANT
NDL 18GX1X1/2 (RX/OR ONLY) (NEEDLE) ×2 IMPLANT
NDL HYPO 25GX1X1/2 BEV (NEEDLE) ×2 IMPLANT
NEEDLE 18GX1X1/2 (RX/OR ONLY) (NEEDLE) ×4 IMPLANT
NEEDLE HYPO 25GX1X1/2 BEV (NEEDLE) ×4 IMPLANT
NS IRRIG 1000ML POUR BTL (IV SOLUTION) ×4 IMPLANT
PACK CV ACCESS (CUSTOM PROCEDURE TRAY) ×4 IMPLANT
PACK SURGICAL SETUP 50X90 (CUSTOM PROCEDURE TRAY) ×2 IMPLANT
PAD ARMBOARD 7.5X6 YLW CONV (MISCELLANEOUS) ×8 IMPLANT
SLEEVE SURGEON STRL (DRAPES) ×2 IMPLANT
SOAP 2 % CHG 4 OZ (WOUND CARE) ×4 IMPLANT
SUT ETHILON 3 0 PS 1 (SUTURE) ×4 IMPLANT
SUT PROLENE 6 0 CC (SUTURE) ×4 IMPLANT
SUT SILK 2 0 (SUTURE) ×4
SUT SILK 2-0 18XBRD TIE 12 (SUTURE) IMPLANT
SUT SILK 3 0 (SUTURE) ×4
SUT SILK 3-0 18XBRD TIE 12 (SUTURE) IMPLANT
SUT SILK 4 0 (SUTURE) ×4
SUT SILK 4-0 18XBRD TIE 12 (SUTURE) IMPLANT
SUT VIC AB 3-0 SH 27 (SUTURE) ×4
SUT VIC AB 3-0 SH 27X BRD (SUTURE) ×2 IMPLANT
SUT VICRYL 4-0 PS2 18IN ABS (SUTURE) ×6 IMPLANT
SYR 10ML LL (SYRINGE) ×3 IMPLANT
SYR 20CC LL (SYRINGE) ×8 IMPLANT
SYR 5ML LL (SYRINGE) ×4 IMPLANT
SYR CONTROL 10ML LL (SYRINGE) ×4 IMPLANT
TAPE CLOTH SURG 4X10 WHT LF (GAUZE/BANDAGES/DRESSINGS) ×2 IMPLANT
TOWEL OR 17X24 6PK STRL BLUE (TOWEL DISPOSABLE) ×4 IMPLANT
TOWEL OR 17X26 10 PK STRL BLUE (TOWEL DISPOSABLE) ×4 IMPLANT
UNDERPAD 30X30 (UNDERPADS AND DIAPERS) ×4 IMPLANT
WATER STERILE IRR 1000ML POUR (IV SOLUTION) ×4 IMPLANT
WIRE MINI STICK MAX (SHEATH) ×2 IMPLANT

## 2016-09-07 NOTE — Progress Notes (Signed)
Patients left hand has become increasingly cold, no c/o numbness or tingling, +2 radial pulse. MD paged with no answer.

## 2016-09-07 NOTE — Progress Notes (Signed)
HD tx initiated via HD cath w/o problem, pull/push/flush w/ o problem, VSS, will cont to monitor while on HD tx

## 2016-09-07 NOTE — Discharge Instructions (Signed)
° ° °  09/07/2016 Carrie [name] [id number] 16-Dec-1980  Surgeon(s): [name], MD  Procedure(s): Left arm 1st Stage Basilic AV Fistula INSERTION OF DIALYSIS CATHETER  x Do not stick fistula for 12 weeks

## 2016-09-07 NOTE — H&P (View-Only) (Signed)
Patient name: Carrie [name] MRN: [id number] DOB: [birth year] Sex: female   REASON FOR CONSULT:    Needs tunneled dialysis catheter and permanent access. Consult is from Dr. Candiss Norse.  HPI:   Carrie [name] is a 36 y.o. female, Who was admitted on 09/04/2016. She had been vomiting off and on for the last month. Her past medical history significant for chronic kidney disease secondary to diabetic nephropathy. Her creatinine has gradually gotten worse and for this reason vascular surgery's consult for placement of a tunneled dialysis catheter and to evaluate her for permanent access.  She is right-handed. Her nausea has improved.  She has not had any previous catheters. She does not have a pacemaker.  Past Medical History:  Diagnosis Date  . Diabetes mellitus without complication ([place])   . Diabetic neuropathy ([place])   . Gastroparesis   . HA (headache)   . Hypertension   . Neuropathy   . Renal disorder   . Trigeminal neuralgia     Family History  Problem Relation Age of Onset  . Hyperlipidemia Mother   . Asthma Daughter   . Diabetes Maternal Grandmother   . Kidney disease Maternal Grandfather   . Kidney disease Paternal Grandmother     SOCIAL HISTORY: Social History   Social History  . Marital status: Single    Spouse name: N/A  . Number of children: 1  . Years of education: 61   Occupational History  . [employer]   Social History Main Topics  . Smoking status: Never Smoker  . Smokeless tobacco: Never Used  . Alcohol use No     Comment: former use  . Drug use: No  . Sexual activity: Yes   Other Topics Concern  . Not on file   Social History Narrative   Patient lives at home with her father and daughter. Patient works full time at [employer] .   Education some college.   Right handed   Caffeine Five sodas daily.             No Known Allergies  Current Facility-Administered Medications  Medication Dose Route Frequency Provider Last Rate  Last Dose  . acetaminophen (TYLENOL) tablet 650 mg  650 mg Oral Q6H PRN Jonetta Osgood, MD   650 mg at 09/05/16 1246   Or  . acetaminophen (TYLENOL) suppository 650 mg  650 mg Rectal Q6H PRN Jonetta Osgood, MD      . albuterol (PROVENTIL) (2.5 MG/3ML) 0.083% nebulizer solution 2.5 mg  2.5 mg Nebulization Q2H PRN Ghimire, Henreitta Leber, MD      . amLODipine (NORVASC) tablet 10 mg  10 mg Oral Daily Thurnell Lose, MD   10 mg at 09/05/16 1104  . bisacodyl (DULCOLAX) suppository 10 mg  10 mg Rectal Daily Thurnell Lose, MD   10 mg at 09/05/16 1105  . carbamazepine (TEGRETOL) tablet 200 mg  200 mg Oral QPM Jonetta Osgood, MD   200 mg at 09/05/16 1716  . Darbepoetin Alfa (ARANESP) injection 100 mcg  100 mcg Subcutaneous Q Tue-1800 Edrick Oh, MD   100 mcg at 09/05/16 1715  . docusate sodium (COLACE) capsule 200 mg  200 mg Oral BID Thurnell Lose, MD   200 mg at 09/05/16 1104  . heparin injection 5,000 Units  5,000 Units Subcutaneous Q8H Jonetta Osgood, MD   5,000 Units at 09/05/16 4034  . hydrALAZINE (APRESOLINE) injection 10 mg  10 mg Intravenous Q6H PRN Ghimire,  Henreitta Leber, MD      . hydrALAZINE (APRESOLINE) tablet 50 mg  50 mg Oral TID Jonetta Osgood, MD   50 mg at 09/05/16 1608  . insulin aspart (novoLOG) injection 0-9 Units  0-9 Units Subcutaneous TID WC Jonetta Osgood, MD   3 Units at 09/05/16 1714  . insulin glargine (LANTUS) injection 30 Units  30 Units Subcutaneous q morning - 10a Jonetta Osgood, MD   30 Units at 09/05/16 [phone]  . labetalol (NORMODYNE) tablet 400 mg  400 mg Oral BID Jonetta Osgood, MD   400 mg at 09/05/16 0925  . ondansetron (ZOFRAN) injection 4 mg  4 mg Intravenous Q8H Ghimire, Henreitta Leber, MD   4 mg at 09/05/16 1437  . pantoprazole (PROTONIX) EC tablet 40 mg  40 mg Oral Daily Jonetta Osgood, MD   40 mg at 09/05/16 0925  . polyethylene glycol (MIRALAX / GLYCOLAX) packet 17 g  17 g Oral Daily Jonetta Osgood, MD   17 g at 09/05/16 0925    . promethazine (PHENERGAN) injection 25 mg  25 mg Intravenous Q6H PRN Jonetta Osgood, MD   25 mg at 09/05/16 1246  . sevelamer carbonate (RENVELA) tablet 800 mg  800 mg Oral TID WC Ghimire, Henreitta Leber, MD   800 mg at 09/05/16 1716    REVIEW OF SYSTEMS:  [X]  denotes positive finding, [ ]  denotes negative finding Cardiac  Comments:  Chest pain or chest pressure:    Shortness of breath upon exertion:    Short of breath when lying flat:    Irregular heart rhythm:        Vascular    Pain in calf, thigh, or hip brought on by ambulation:    Pain in feet at night that wakes you up from your sleep:     Blood clot in your veins:    Leg swelling:         Pulmonary    Oxygen at home:    Productive cough:     Wheezing:         Neurologic    Sudden weakness in arms or legs:     Sudden numbness in arms or legs:     Sudden onset of difficulty speaking or slurred speech:    Temporary loss of vision in one eye:     Problems with dizziness:         Gastrointestinal    Blood in stool:     Vomited blood:         Genitourinary    Burning when urinating:     Blood in urine:        Psychiatric    Major depression:         Hematologic    Bleeding problems:    Problems with blood clotting too easily:        Skin    Rashes or ulcers:        Constitutional    Fever or chills:     PHYSICAL EXAM:   Vitals:   09/05/16 0900 09/05/16 1104 09/05/16 1608 09/05/16 1726  BP: (!) 141/89 (!) 147/77 137/74 135/81  Pulse: 89   86  Resp: 16   17  Temp: 98.1 F (36.7 C)   98.2 F (36.8 C)  TempSrc: Oral   Oral  SpO2: 100%   100%  Weight:      Height:        GENERAL: The patient is a well-nourished  female, in no acute distress. The vital signs are documented above. CARDIAC: There is a regular rate and rhythm.  VASCULAR: She has palpable radial pulses bilaterally. She has an IV in her right forearm. PULMONARY: There is good air exchange bilaterally without wheezing or rales. ABDOMEN:  Soft and non-tender with normal pitched bowel sounds.  MUSCULOSKELETAL: There are no major deformities or cyanosis. NEUROLOGIC: No focal weakness or paresthesias are detected. SKIN: There are no ulcers or rashes noted. PSYCHIATRIC: The patient has a normal affect.  DATA:    VEIN MAP: Her vein mapping is pending.  CT ABDOMEN: Her CT of the abdomen done yesterday was unremarkable. There was no explanation for her left lower quadrant pain. There was no evidence of nephrolithiasis or obstructive uropathy.  Her GFR is 8. Her potassium is 4.2. Hemoglobin is 7.8.  MEDICAL ISSUES:   STAGE V CHRONIC KIDNEY DISEASE: Pending the results of her vein map we can evaluate her for long-term access. I will try to find room on the schedule for Thursday or Friday for surgery. I have discussed the procedure potential complications with her and she is agreeable to proceed.  Deitra Mayo [facility] [phone]

## 2016-09-07 NOTE — Telephone Encounter (Signed)
Sched lab 10/09/16 at 3:30 and MD 10/16/16 at 12:00. Spoke to pt to confirm appts.

## 2016-09-07 NOTE — Transfer of Care (Signed)
Immediate Anesthesia Transfer of Care Note  Patient: Carrie [name]  Procedure(s) Performed: Procedure(s): Left arm 1st Stage Basilic AV Fistula (Left) INSERTION OF DIALYSIS CATHETER (Right)  Patient Location: PACU  Anesthesia Type:MAC  Level of Consciousness: awake, alert  and oriented  Airway & Oxygen Therapy: Patient Spontanous Breathing  Post-op Assessment: Report given to RN and Post -op Vital signs reviewed and stable  Post vital signs: Reviewed and stable  Last Vitals:  Vitals:   09/06/16 2245 09/07/16 0553  BP: (!) 141/76 129/80  Pulse: 89 84  Resp: 17 17  Temp: 36.8 C 37.1 C    Last Pain:  Vitals:   09/07/16 0553  TempSrc: Oral  PainSc:       Patients Stated Pain Goal: 0 (57/47/34 0370)  Complications: No apparent anesthesia complications

## 2016-09-07 NOTE — Interval H&P Note (Signed)
History and Physical Interval Note:  09/07/2016 7:28 AM  Carrie [name]  has presented today for surgery, with the diagnosis of end stage renal disease  The various methods of treatment have been discussed with the patient and family. After consideration of risks, benefits and other options for treatment, the patient has consented to  Procedure(s): ARTERIOVENOUS (AV) FISTULA VS GRAFT (N/A) INSERTION OF DIALYSIS CATHETER (N/A) as a surgical intervention .  The patient's history has been reviewed, patient examined, no change in status, stable for surgery.  I have reviewed the patient's chart and labs.  Questions were answered to the patient's satisfaction.     Annamarie Major  After reviewing her vein map. We discussed proceeding with a left 1st stage BVT with a catheter.  We discussed the rsiks of the procedure including the need for a second operation  WElls [name]

## 2016-09-07 NOTE — Progress Notes (Signed)
PROGRESS NOTE    Carrie [name]  QMV:[id number] DOB: 1980-09-08 DOA: 09/04/2016 PCP: Annetta Maw, MD   Brief Narrative: 36 y.o.femalewith medical history significant of stage V chronic kidney disease, type 1 diabetes, trigeminal neuralgia, peripheral neuropathy who presented to the hospital (third visit this month) for evaluation of the nausea vomiting and abdominal pain. Per patient, she has a known history of gastroparesis-and was maintained on Reglan for approximately 1 year. Approximately 2 months back, Reglan was discontinued by her primary nephrologist and primary care practitioner due to concerns of early tardive dyskinesia. She then started having intermittent nausea and vomiting, that has gradually worsened, workup in the ER showed CT scan with moderate stool burden but no acute findings, creatinine close to 7, she was admitted for persistent nausea vomiting, now likely ESRD with possible dialysis need.  Assessment & Plan:  #  Chronic kidney disease is stage V now progressed to ESRD: -Status post tunneled catheter placement and left upper extremity AV fistula creation by vascular surgery today. Plan to initiate hemodialysis treatment by nephrologist. Continue to monitor BMP, blood pressure and electrolytes. Both vascular and nephrology consult appreciated.   #Diabetic gastroparesis with chronic nausea and vomiting: Unable to tolerate Reglan due to side effect of tardive dyskinesia. CT scan of abdomen and pelvis with stone burden. Patient reported better with bowel movement. Denied nausea vomiting or abdominal pain. Continue supportive care and bowel regimen. -Advance diet as tolerated  #Hypertension: Continue to monitor blood pressure. Continue Norvasc, hydralazine, labetalol. May need to taper down blood pressure medication after initiation of hemodialysis treatment.  #Anemia of chronic kidney disease: Defer to nephrology for IV iron and Aranesp treatment during dialysis. Monitor  CBC  #History of trigeminal neuralgia: Continue Tegretol  #Type 1 diabetes with Diabetic peripheral neuropathy: Blood sugar level better controlled. Continue current insulin regimen. Continue to monitor blood sugar level. A1c 6.8.  #Acid reflux: Continue Protonix. 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 Came back after vascular procedure. Reports weakness and mild nausea. Denied vomiting, chest, shortness of breath. Patient's mother at bedside.  Objective: Vitals:   09/07/16 1020 09/07/16 1025 09/07/16 1030 09/07/16 1035  BP:   (!) 149/89   Pulse: 83 85 85 84  Resp: 11 16 17 14   Temp:   97.4 F (36.3 C)   TempSrc:      SpO2: 100% 100% 100% 100%  Weight:      Height:        Intake/Output Summary (Last 24 hours) at 09/07/16 1241 Last data filed at 09/07/16 0935  Gross per 24 hour  Intake              767 ml  Output               15 ml  Net              752 ml   Filed Weights   09/04/16 2048 09/05/16 2123 09/06/16 2245  Weight: 84.6 kg (186 lb 8.2 oz) 87 kg (191 lb 11.2 oz) 87.1 kg (  192 lb)    Examination:  General exam: Lying on bed comfortable, not in distress, right IJ catheter site looks clean. She has left upper extremity AV fistula. No bleeding. Respiratory system: Clear bilateral. Respiratory effort normal. No wheezing or crackle Cardiovascular system: Regular rate rhythm, S1-S2 normal.  No lower extremity edema. Gastrointestinal system: Abdomen is nondistended, soft and nontender. Normal bowel  sounds heard. Central nervous system: Alert and oriented. No focal neurological deficits. Extremities: Symmetric 5 x 5 power. Skin: No rashes, lesions or ulcers Psychiatry: Judgement and insight appear normal. Mood & affect appropriate.     Data Reviewed: I have personally reviewed following labs and imaging studies  CBC:  Recent Labs Lab 09/04/16 2106 09/05/16 0705 09/06/16 0614 09/07/16 0408 09/07/16 1110  WBC 6.3 6.4 5.4 6.0 6.2  HGB 8.4* 7.8* 7.5* 7.9* 8.0*  HCT 25.7* 24.3* 23.3* 24.9* 25.1*  MCV 88.9 90.3 89.6 91.9 90.9  PLT 281 265 256 283 124   Basic Metabolic Panel:  Recent Labs Lab 09/04/16 1243 09/04/16 2106 09/05/16 0705 09/06/16 0614 09/07/16 0408 09/07/16 1110  NA 135  --  137 137 136 137  K 4.7  --  4.2 3.8 4.8 4.6  CL 101  --  108 106 107 108  CO2 23  --  23 24 22  19*  GLUCOSE 302*  --  205* 65 126* 169*  BUN 48*  --  43* 33* 36* 35*  CREATININE 6.92* 6.75* 7.09* 6.69* 7.07* 6.92*  CALCIUM 9.2  --  8.5* 8.4* 8.5* 8.4*  PHOS  --   --  3.9  --   --  3.9   GFR: Estimated Creatinine Clearance: 13.4 mL/min (A) (by C-G formula based on SCr of 6.92 mg/dL (H)). Liver Function Tests:  Recent Labs Lab 09/04/16 1243 09/05/16 0705 09/07/16 1110  AST 20  --   --   ALT 19  --   --   ALKPHOS 69  --   --   BILITOT 0.4  --   --   PROT 7.2  --   --   ALBUMIN 3.9 3.0* 2.9*    Recent Labs Lab 09/04/16 1243  LIPASE 24   No results for input(s): AMMONIA in the last 168 hours. Coagulation Profile:  Recent Labs Lab 09/07/16 0408  INR 0.98   Cardiac Enzymes: No results for input(s): CKTOTAL, CKMB, CKMBINDEX, TROPONINI in the last 168 hours. BNP (last 3 results) No results for input(s): PROBNP in the last 8760 hours. HbA1C:  Recent Labs  09/04/16 2106  HGBA1C 6.8*   CBG:  Recent Labs Lab 09/06/16 1142 09/06/16 1645 09/06/16 2124 09/07/16 0950 09/07/16 1058  GLUCAP 148* 162* 122* 148* 171*   Lipid Profile: No results for input(s):  CHOL, HDL, LDLCALC, TRIG, CHOLHDL, LDLDIRECT in the last 72 hours. Thyroid Function Tests: No results for input(s): TSH, T4TOTAL, FREET4, T3FREE, THYROIDAB in the last 72 hours. Anemia Panel:  Recent Labs  09/05/16 1115  TIBC 235*  IRON 59   Sepsis Labs: No results for input(s): PROCALCITON, LATICACIDVEN in the last 168 hours.  Recent Results (from the past 240 hour(s))  Surgical pcr screen     Status: None   Collection Time: 09/06/16 11:28 PM  Result Value Ref Range Status   MRSA, PCR NEGATIVE NEGATIVE Final   Staphylococcus aureus NEGATIVE NEGATIVE Final    Comment:        The Xpert SA Assay (FDA approved for NASAL specimens in patients over 32 years of age), is  one component of a comprehensive surveillance program.  Test performance has been validated by [facility] for patients greater than or equal to 44 year old. It is not intended to diagnose infection nor to guide or monitor treatment.          Radiology Studies: Dg Chest 1 View  Result Date: 09/07/2016 CLINICAL DATA:  Diatek catheter placement. EXAM: CHEST 1 VIEW COMPARISON:  11/21/2012 chest radiograph FINDINGS: A single intraoperative view of the upper right chest is submitted postoperatively for interpretation. A right IJ central venous catheter is noted with tip overlying the superior cavoatrial junction. No definite pneumothorax noted. IMPRESSION: Right IJ central venous catheter with tip overlying the superior cavoatrial junction. Electronically Signed   By: Margarette Canada M.D.   On: 09/07/2016 10:24   Dg Chest Port 1 View  Result Date: 09/07/2016 CLINICAL DATA:  36 year old female with a history of dialysis catheter insertion EXAM: PORTABLE CHEST 1 VIEW COMPARISON:  11/21/2012 FINDINGS: Compare to the prior plain film of 11/21/2012 there is increased size of the cardiomediastinal silhouette. Reduced lung volumes. Interval placement of right IJ approach hemodialysis catheter with the tip appearing to  terminate at the superior cavoatrial junction. No pneumothorax. No pleural effusion. No confluent airspace disease. IMPRESSION: Interval placement of right IJ approach hemodialysis catheter. No complicating features or evidence of acute cardiopulmonary disease. Since the plain film of 2014 there has been interval enlargement of the cardiomediastinal silhouette, which may be accentuated by the low lung volumes. Electronically Signed   By: Corrie Mckusick D.O.   On: 09/07/2016 10:22   Dg Fluoro Guide Cv Line Right  Result Date: 09/07/2016 Fluoroscopy was utilized by the requesting physician.  No radiographic interpretation.        Scheduled Meds: . amLODipine  10 mg Oral Daily  . bisacodyl  10 mg Rectal Daily  . carbamazepine  200 mg Oral QPM  . darbepoetin (ARANESP) injection - NON-DIALYSIS  100 mcg Subcutaneous Q Tue-1800  . docusate sodium  200 mg Oral BID  . fentaNYL      . heparin  5,000 Units Subcutaneous Q8H  . hydrALAZINE  50 mg Oral TID  . insulin aspart  0-9 Units Subcutaneous TID WC  . insulin aspart  3 Units Subcutaneous TID WC  . insulin glargine  28 Units Subcutaneous q morning - 10a  . labetalol  400 mg Oral BID  . ondansetron (ZOFRAN) IV  4 mg Intravenous Q8H  . pantoprazole  40 mg Oral Daily  . polyethylene glycol  17 g Oral Daily  . sevelamer carbonate  800 mg Oral TID WC   Continuous Infusions: . sodium chloride    . sodium chloride       LOS: 3 days    Simya Tercero Tanna Furry, MD [facility] Pager [phone]  If 7PM-7AM, please contact night-coverage [email or web address] Password [facility] 09/07/2016, 12:41 PM

## 2016-09-07 NOTE — Telephone Encounter (Signed)
-----   Message from Mena Goes, RN sent at 09/07/2016 10:15 AM EDT ----- Regarding: 4-6 weeks with duplex   ----- Message ----- From: Natividad Brood Sent: 09/07/2016   9:37 AM To: Vvs Charge Pool  S/p left 1st stage BVT 09/07/16.  F/u with Dr. Trula Slade in 4-6 weeks with duplex.  Thanks.

## 2016-09-07 NOTE — Anesthesia Postprocedure Evaluation (Signed)
Anesthesia Post Note  Patient: Carrie [name]  Procedure(s) Performed: Procedure(s) (LRB): Left arm 1st Stage Basilic AV Fistula (Left) INSERTION OF DIALYSIS CATHETER (Right)  Patient location during evaluation: PACU Anesthesia Type: MAC Level of consciousness: awake and alert Pain management: pain level controlled Vital Signs Assessment: post-procedure vital signs reviewed and stable Respiratory status: spontaneous breathing, nonlabored ventilation and respiratory function stable Cardiovascular status: stable and blood pressure returned to baseline Anesthetic complications: no       Last Vitals:  Vitals:   09/07/16 1030 09/07/16 1035  BP: (!) 149/89   Pulse: 85 84  Resp: 17 14  Temp: 36.3 C     Last Pain:  Vitals:   09/07/16 1030  TempSrc:   PainSc: 2                  Nyheim Seufert,W. EDMOND

## 2016-09-07 NOTE — Op Note (Signed)
Patient name: Carrie [name] MRN: [id number] DOB: 09-04-1980 Sex: female  09/04/2016 - 09/07/2016 Pre-operative Diagnosis: ESRD Post-operative diagnosis:  Same Surgeon:  Annamarie Major Assistants:  S. Rhyne Procedure:   #1: Ultrasound guided placement of a 23 cm right internal jugular vein tunneled dialysis catheter   #2: Left first stage basilic vein fistula Anesthesia:  MAC Blood Loss:  See anesthesia record Specimens:  none  Findings:  35m vein  Indications:  The patient suffers from end-stage renal disease secondary to diabetes.  She is here today for a fistula and catheter  Procedure:  [place]  The patient was then placed supine on the table. MAC anesthesia was administered.  The patient was prepped and draped in the usual sterile fashion.  A time out was called and antibiotics were administered.  Ultrasound was used to evaluate the right internal jugular vein which was easily compressible and widely patent.  One percent lidocaine was used for local anesthesia.  An 11 blade was used to make a skin nick.  The right internal jugular vein was then cannulated under ultrasound guidance using a micropuncture needle.  An 018 wire was advanced without resistance felt micropuncture sheath.  An 035 wire was then inserted.  Subcutaneous tract was dilated with sequential dilators and a peel-away sheath was placed.  A 23 cm Pallindrome catheter was inserted and the sheath was removed.  A skin exit site was selected.  Again this was anesthetized with 1% lidocaine.  A subcutaneous tunnel was created and the cath was pulled through the tunnel with the cuff situated the skin exit site.  I confirmed with fluoroscopy that the tip of the catheter was at the caval-atrial junction.  The catheter was connected to the ports, both of which flushed and aspirated without difficulty.  The catheter was sutured in position with 3-0 nylon.  The skin incision  the neck was closed with 4-0 Vicryl followed by Dermabond.  The catheter was then filled the appropriate volumes of heparin.  The patient's left arm was then prepped and draped sterilely.  Ultrasound was used to identify the basilic vein.  It appeared of adequate size.  The cephalic vein did not appear to be adequate.  A oblique incision was made antecubital crease after anesthetizing the skin with 1% lidocaine.  I dissected out the brachial artery.  This was a 3 mm disease-free artery.  I then identified the basilic vein.  This was approximately a 3 mm vein.  There are multiple branches which were ligated between silk ties.  The vein was then divided distally.  It distended nicely with heparin saline.  It was marked for orientation.  3000 units of heparin were given.  The brachial artery was occluded with vascular clamps.  A #11 blade ECornelia Copamake an arteriotomy which was extended longitudinally with Potts scissors.  The vein was then spatulated and a end-to-side anastomosis was created with running 6-0 Prolene.  Prior to completion the appropriate flushing maneuvers were performed and the anastomosis was completed.A thrill within the fistula.  The patient had a radial artery Doppler signal that did not change significantly with fistula compression.  I inspected the course of the fistula under the skin and there did not appear to be any kinks or additional branches.  25 mg of protamine was given.  Once hemostasis was satisfactory, the incision was closed with 2 layers of 3-0 Vicryl followed by Dermabond.  Disposition:  To PACU stable   V. Annamarie Major, M.D. [facility] [place] Office: [phone] Pager:  [phone]

## 2016-09-07 NOTE — Progress Notes (Signed)
HD tx completed @ 0998 w/o problem, UF goal met, blood rinsed back, report called to Terance Ice, RN

## 2016-09-07 NOTE — Progress Notes (Signed)
[facility] ROUNDING NOTE   Subjective:   Interval History:HPI:Carrie Y Cookis a 36 y.o.femalewith medical history significant of stage V chronic kidney disease, type 1 diabetes, trigeminal neuralgia, peripheral neuropathy who presented to the hospital (third visit this month) for evaluation of the above-noted complaints. Per patient, she has a known history of gastroparesis-and was maintained on Reglan for approximately 1 year. Creatinine 1/18 2.5 --- 5.6 --- 7  Greater than 300 mg proteinuria CT abdomen no hydronephrosis. Patient planned for AVG and catheter today  Objective:  Vital signs in last 24 hours:  Temp:  [96.9 F (36.1 C)-98.7 F (37.1 C)] 96.9 F (36.1 C) (05/24 0945) Pulse Rate:  [84-89] 84 (05/24 0946) Resp:  [9-18] 9 (05/24 0946) BP: (117-142)/(67-81) 142/81 (05/24 0946) SpO2:  [100 %] 100 % (05/24 0946) Weight:  [192 lb (87.1 kg)] 192 lb (87.1 kg) (05/23 2245)  Weight change: 4.8 oz (0.136 kg) Filed Weights   09/04/16 2048 09/05/16 2123 09/06/16 2245  Weight: 186 lb 8.2 oz (84.6 kg) 191 lb 11.2 oz (87 kg) 192 lb (87.1 kg)    Intake/Output: I/O last 3 completed shifts: In: 558.3 [P.O.:462; I.V.:96.3] Out: 0    Intake/Output this shift:  Total I/O In: 425 [I.V.:425] Out: 15 [Blood:15]  CVS- RRR RS- CTA ABD- BS present soft non-distended EXT- no edema   Basic Metabolic Panel:  Recent Labs Lab 08/31/16 1031 09/04/16 1243 09/04/16 2106 09/05/16 0705 09/06/16 0614 09/07/16 0408  NA 138 135  --  137 137 136  K 4.1 4.7  --  4.2 3.8 4.8  CL 103 101  --  108 106 107  CO2 25 23  --  23 24 22   GLUCOSE 245* 302*  --  205* 65 126*  BUN 44* 48*  --  43* 33* 36*  CREATININE 6.46* 6.92* 6.75* 7.09* 6.69* 7.07*  CALCIUM 9.3 9.2  --  8.5* 8.4* 8.5*  PHOS  --   --   --  3.9  --   --     Liver Function Tests:  Recent Labs Lab 08/31/16 1031 09/04/16 1243 09/05/16 0705  AST 26 20  --   ALT 20 19  --   ALKPHOS 69 69  --    BILITOT 0.6 0.4  --   PROT 7.5 7.2  --   ALBUMIN 3.8 3.9 3.0*    Recent Labs Lab 08/31/16 1031 09/04/16 1243  LIPASE 22 24   No results for input(s): AMMONIA in the last 168 hours.  CBC:  Recent Labs Lab 08/31/16 1031 09/04/16 1243 09/04/16 2106 09/05/16 0705 09/06/16 0614 09/07/16 0408  WBC 6.1 7.2 6.3 6.4 5.4 6.0  NEUTROABS 4.2  --   --   --   --   --   HGB 8.8* 8.9* 8.4* 7.8* 7.5* 7.9*  HCT 26.0* 26.7* 25.7* 24.3* 23.3* 24.9*  MCV 88.7 88.7 88.9 90.3 89.6 91.9  PLT 315 315 281 265 256 283    Cardiac Enzymes: No results for input(s): CKTOTAL, CKMB, CKMBINDEX, TROPONINI in the last 168 hours.  BNP: Invalid input(s): POCBNP  CBG:  Recent Labs Lab 09/06/16 0754 09/06/16 1142 09/06/16 1645 09/06/16 2124 09/07/16 0950  GLUCAP 75 148* 162* 122* 148*    Microbiology: Results for orders placed or performed during the hospital encounter of 09/04/16  Surgical pcr screen     Status: None   Collection Time: 09/06/16 11:28 PM  Result Value Ref Range Status   MRSA, PCR NEGATIVE NEGATIVE Final  Staphylococcus aureus NEGATIVE NEGATIVE Final    Comment:        The Xpert SA Assay (FDA approved for NASAL specimens in patients over 46 years of age), is one component of a comprehensive surveillance program.  Test performance has been validated by [facility] for patients greater than or equal to 76 year old. It is not intended to diagnose infection nor to guide or monitor treatment.     Coagulation Studies:  Recent Labs  09/07/16 0408  LABPROT 13.0  INR 0.98    Urinalysis:  Recent Labs  09/04/16 1301  COLORURINE YELLOW  LABSPEC 1.014  PHURINE 6.0  GLUCOSEU >=500*  HGBUR NEGATIVE  BILIRUBINUR NEGATIVE  KETONESUR NEGATIVE  PROTEINUR >=300*  NITRITE NEGATIVE  LEUKOCYTESUR NEGATIVE      Imaging: Dg Fluoro Guide Cv Line-no Report  Result Date: 09/07/2016 Fluoroscopy was utilized by the requesting physician.  No radiographic  interpretation.     Medications:    . [MAR Hold] amLODipine  10 mg Oral Daily  . [MAR Hold] bisacodyl  10 mg Rectal Daily  . [MAR Hold] carbamazepine  200 mg Oral QPM  . [MAR Hold] darbepoetin (ARANESP) injection - NON-DIALYSIS  100 mcg Subcutaneous Q Tue-1800  . [MAR Hold] docusate sodium  200 mg Oral BID  . [MAR Hold] heparin  5,000 Units Subcutaneous Q8H  . [MAR Hold] hydrALAZINE  50 mg Oral TID  . [MAR Hold] insulin aspart  0-9 Units Subcutaneous TID WC  . [MAR Hold] insulin aspart  3 Units Subcutaneous TID WC  . [MAR Hold] insulin glargine  28 Units Subcutaneous q morning - 10a  . [MAR Hold] labetalol  400 mg Oral BID  . [MAR Hold] ondansetron (ZOFRAN) IV  4 mg Intravenous Q8H  . [MAR Hold] pantoprazole  40 mg Oral Daily  . [MAR Hold] polyethylene glycol  17 g Oral Daily  . 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 We shall proceed with preparations for dialysis and will have a permcath and vascular access studies  Appreciiate Dr Nicole Cella help  Planned for access today will dialyze this afternoon  HTN Appears labile will follow and will adjust antihypertensives   Diabetes controlled with insulin  Bones  PTH 109  Anemia  Iron stores good      LOS: 3 Carrie [name] W @TODAY @10 :03 AM

## 2016-09-08 ENCOUNTER — Encounter (HOSPITAL_COMMUNITY): Payer: Self-pay | Admitting: Surgery

## 2016-09-08 DIAGNOSIS — E10649 Type 1 diabetes mellitus with hypoglycemia without coma: Secondary | ICD-10-CM

## 2016-09-08 LAB — GLUCOSE, CAPILLARY
GLUCOSE-CAPILLARY: 144 mg/dL — AB (ref 65–99)
Glucose-Capillary: 124 mg/dL — ABNORMAL HIGH (ref 65–99)
Glucose-Capillary: 38 mg/dL — CL (ref 65–99)
Glucose-Capillary: 164 mg/dL — ABNORMAL HIGH (ref 65–99)
Glucose-Capillary: 133 mg/dL — ABNORMAL HIGH (ref 65–99)

## 2016-09-08 LAB — HEPATITIS B CORE ANTIBODY, TOTAL: Hep B Core Total Ab: NEGATIVE

## 2016-09-08 LAB — HEPATITIS B SURFACE ANTIBODY,QUALITATIVE: Hep B S Ab: REACTIVE

## 2016-09-08 LAB — PTH-RELATED PEPTIDE: PTH-related peptide: <2 pmol/L

## 2016-09-08 LAB — HEPATITIS B SURFACE ANTIGEN: Hepatitis B Surface Ag: NEGATIVE

## 2016-09-08 MED ORDER — DEXTROSE 50 % IV SOLN
INTRAVENOUS | Status: AC
  Administered 2016-09-08: 50 mL
  Filled 2016-09-08: qty 50

## 2016-09-08 MED ORDER — INSULIN GLARGINE 100 UNIT/ML ~~LOC~~ SOLN
5.0000 [IU] | Freq: Every morning | SUBCUTANEOUS | Status: DC
  Administered 2016-09-08 – 2016-09-12 (×5): 5 [IU] via SUBCUTANEOUS
  Filled 2016-09-08 (×6): qty 0.05

## 2016-09-08 NOTE — Progress Notes (Signed)
PROGRESS NOTE    Carrie [name]  XIP:[id number] DOB: [birth year] DOA: 09/04/2016 PCP: Annetta Maw, MD   Brief Narrative: 36 y.o.femalewith medical history significant of stage V chronic kidney disease, type 1 diabetes, trigeminal neuralgia, peripheral neuropathy who presented to the hospital (third visit this month) for evaluation of the nausea vomiting and abdominal pain. Per patient, she has a known history of gastroparesis-and was maintained on Reglan for approximately 1 year. Approximately 2 months back, Reglan was discontinued by her primary nephrologist and primary care practitioner due to concerns of early tardive dyskinesia. She then started having intermittent nausea and vomiting, that has gradually worsened, workup in the ER showed CT scan with moderate stool burden but no acute findings, creatinine close to 7, she was admitted for persistent nausea, vomiting, now likely ESRD with possible dialysis need.  Assessment & Plan:  #  Chronic kidney disease is stage V now progressed to ESRD: -Status post tunneled catheter placement and left upper extremity AV fistula creation by vascular surgery on 5/24. Initiated hemodialysis treatment by nephrologist.  -Further dialysis related treatment as per nephrologist.  -Outpatient setting of evaluation ongoing.  -Continue to monitor BMP, blood pressure and electrolytes.  -Both vascular and nephrology consult appreciated.   #Diabetic gastroparesis with chronic nausea and vomiting: Unable to tolerate Reglan due to side effect of tardive dyskinesia. CT scan of abdomen and pelvis with stone burden.  -Has nausea but no vomiting or abdominal pain. Advancing diet as tolerated. Continue supportive care  #Hypertension: Continue to monitor blood pressure. Continue hydralazine, labetalol. Discontinue Norvasc after initiation of hemodialysis. May need to continue to taper down blood pressure medication after initiation of hemodialysis treatment.  #Anemia  of chronic kidney disease: Defer to nephrology for IV iron and Aranesp treatment during dialysis. Monitor CBC  #History of trigeminal neuralgia: Continue Tegretol  #Type 1 diabetes with Diabetic peripheral neuropathy: With hypoglycemia this morning likely because of decreased oral intake. I reduced the dose of Lantus to 5 units only. Encourage oral intake. Continue to monitor blood sugar level.  A1c 6.8.  #Acid reflux: Continue Protonix. 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 Episode of hypoglycemia this morning. Denied headache, dizziness, nausea, vomiting, chest pain or shortness of breath. Objective: Vitals:   09/07/16 1700 09/07/16 2144 09/08/16 0507 09/08/16 0846  BP: (!) 150/83 104/66 124/67 131/72  Pulse: 88 98 82 89  Resp: 14 15 15 16   Temp: 97.5 F (36.4 C) 99.6 F (37.6 C) 98.4 F (36.9 C) 98.6 F (37 C)  TempSrc: Oral Oral Oral Oral  SpO2: 100% 100% 99% 100%  Weight: 84.7 kg (186 lb 11.7 oz) 84.9 kg (187 lb 2.7 oz)    Height:        Intake/Output Summary (Last 24 hours) at 09/08/16 1257 Last data filed at 09/08/16 1000  Gross per 24 hour  Intake              600 ml  Output             2000  ml  Net            -1400 ml   Filed Weights   09/07/16 1450 09/07/16 1700 09/07/16 2144  Weight: 86.7 kg (191 lb 2.2 oz) 84.7 kg (186 lb 11.7 oz) 84.9 kg (187 lb 2.7 oz)    Examination:  General exam: Not in distress Respiratory system: Clear bilateral. Respiratory effort normal. No wheezing or  crackle Cardiovascular system: Regular rate rhythm, S1-S2 normal.  No lower extremity edema. Gastrointestinal system: Abdomen soft, nondistended, nontender tender. Bowel sound positive Central nervous system: Alert and oriented. No focal neurological deficits. Extremities: Symmetric 5 x 5 power. AV fistula site has no bleeding or discharge Skin: No rashes, lesions or ulcers Psychiatry: Judgement and insight appear normal. Mood & affect appropriate.  Tunneled catheter site clean   Data Reviewed: I have personally reviewed following labs and imaging studies  CBC:  Recent Labs Lab 09/04/16 2106 09/05/16 0705 09/06/16 0614 09/07/16 0408 09/07/16 1110  WBC 6.3 6.4 5.4 6.0 6.2  HGB 8.4* 7.8* 7.5* 7.9* 8.0*  HCT 25.7* 24.3* 23.3* 24.9* 25.1*  MCV 88.9 90.3 89.6 91.9 90.9  PLT 281 265 256 283 557   Basic Metabolic Panel:  Recent Labs Lab 09/04/16 1243 09/04/16 2106 09/05/16 0705 09/06/16 0614 09/07/16 0408 09/07/16 1110  NA 135  --  137 137 136 137  K 4.7  --  4.2 3.8 4.8 4.6  CL 101  --  108 106 107 108  CO2 23  --  23 24 22  19*  GLUCOSE 302*  --  205* 65 126* 169*  BUN 48*  --  43* 33* 36* 35*  CREATININE 6.92* 6.75* 7.09* 6.69* 7.07* 6.92*  CALCIUM 9.2  --  8.5* 8.4* 8.5* 8.4*  PHOS  --   --  3.9  --   --  3.9   GFR: Estimated Creatinine Clearance: 13.2 mL/min (A) (by C-G formula based on SCr of 6.92 mg/dL (H)). Liver Function Tests:  Recent Labs Lab 09/04/16 1243 09/05/16 0705 09/07/16 1110 09/07/16 1453  AST 20  --   --   --   ALT 19  --   --  15  ALKPHOS 69  --   --   --   BILITOT 0.4  --   --   --   PROT 7.2  --   --   --   ALBUMIN 3.9 3.0* 2.9*  --     Recent Labs Lab 09/04/16 1243  LIPASE 24   No results for input(s): AMMONIA in the last 168 hours. Coagulation Profile:  Recent Labs Lab 09/07/16 0408  INR 0.98   Cardiac Enzymes: No results for input(s): CKTOTAL, CKMB, CKMBINDEX, TROPONINI in the last 168 hours. BNP (last 3 results) No  results for input(s): PROBNP in the last 8760 hours. HbA1C: No results for input(s): HGBA1C in the last 72 hours. CBG:  Recent Labs Lab 09/07/16 1727 09/07/16 2142 09/08/16 0744 09/08/16 0825 09/08/16 1211  GLUCAP 182* 122* 38* 124* 144*   Lipid Profile: No results for input(s): CHOL, HDL, LDLCALC, TRIG, CHOLHDL, LDLDIRECT in the last 72 hours. Thyroid Function Tests: No results for input(s): TSH, T4TOTAL, FREET4, T3FREE, THYROIDAB in the last 72 hours. Anemia Panel: No results for input(s): VITAMINB12, FOLATE, FERRITIN, TIBC, IRON, RETICCTPCT in the last 72 hours. Sepsis Labs: No results for input(s): PROCALCITON, LATICACIDVEN in the last 168 hours.  Recent Results (from the past 240 hour(s))  Surgical pcr screen     Status: None  Collection Time: 09/06/16 11:28 PM  Result Value Ref Range Status   MRSA, PCR NEGATIVE NEGATIVE Final   Staphylococcus aureus NEGATIVE NEGATIVE Final    Comment:        The Xpert SA Assay (FDA approved for NASAL specimens in patients over 97 years of age), is one component of a comprehensive surveillance program.  Test performance has been validated by [facility] for patients greater than or equal to 39 year old. It is not intended to diagnose infection nor to guide or monitor treatment.          Radiology Studies: Dg Chest 1 View  Result Date: 09/07/2016 CLINICAL DATA:  Diatek catheter placement. EXAM: CHEST 1 VIEW COMPARISON:  11/21/2012 chest radiograph FINDINGS: A single intraoperative view of the upper right chest is submitted postoperatively for interpretation. A right IJ central venous catheter is noted with tip overlying the superior cavoatrial junction. No definite pneumothorax noted. IMPRESSION: Right IJ central venous catheter with tip overlying the superior cavoatrial junction. Electronically Signed   By: Margarette Canada M.D.   On: 09/07/2016 10:24   Dg Chest Port 1 View  Result Date: 09/07/2016 CLINICAL DATA:  36 year old  female with a history of dialysis catheter insertion EXAM: PORTABLE CHEST 1 VIEW COMPARISON:  11/21/2012 FINDINGS: Compare to the prior plain film of 11/21/2012 there is increased size of the cardiomediastinal silhouette. Reduced lung volumes. Interval placement of right IJ approach hemodialysis catheter with the tip appearing to terminate at the superior cavoatrial junction. No pneumothorax. No pleural effusion. No confluent airspace disease. IMPRESSION: Interval placement of right IJ approach hemodialysis catheter. No complicating features or evidence of acute cardiopulmonary disease. Since the plain film of 2014 there has been interval enlargement of the cardiomediastinal silhouette, which may be accentuated by the low lung volumes. Electronically Signed   By: Corrie Mckusick D.O.   On: 09/07/2016 10:22   Dg Fluoro Guide Cv Line Right  Result Date: 09/07/2016 Fluoroscopy was utilized by the requesting physician.  No radiographic interpretation.        Scheduled Meds: . bisacodyl  10 mg Rectal Daily  . carbamazepine  200 mg Oral QPM  . darbepoetin (ARANESP) injection - NON-DIALYSIS  100 mcg Subcutaneous Q Tue-1800  . docusate sodium  200 mg Oral BID  . gabapentin  300 mg Oral QHS  . heparin  5,000 Units Subcutaneous Q8H  . hydrALAZINE  50 mg Oral TID  . insulin aspart  0-9 Units Subcutaneous TID WC  . insulin aspart  3 Units Subcutaneous TID WC  . insulin glargine  5 Units Subcutaneous q morning - 10a  . labetalol  400 mg Oral BID  . ondansetron (ZOFRAN) IV  4 mg Intravenous Q8H  . pantoprazole  40 mg Oral Daily  . polyethylene glycol  17 g Oral Daily  . sevelamer carbonate  800 mg Oral TID WC   Continuous Infusions:    LOS: 4 days    Dron Tanna Furry, MD [facility] Pager [phone]  If 7PM-7AM, please contact night-coverage [email or web address] Password [facility] 09/08/2016, 12:57 PM

## 2016-09-08 NOTE — Progress Notes (Signed)
[facility] ROUNDING NOTE   Subjective:   Interval History:Interval History:HPI:Carrie [name] a 36 y.o.femalewith medical history significant of stage V chronic kidney disease, type 1 diabetes, trigeminal neuralgia, peripheral neuropathy who presented to the hospital (third visit this month) for evaluation of the above-noted complaints. Per patient, she has a known history of gastroparesis-and was maintained on Reglan for approximately 1 year. Creatinine 1/18 2.5 --- 5.6 --- 7  Greater than 300 mg proteinuria CT abdomen no hydronephrosis. AVG and Catheter placed yesterday   Objective:  Vital signs in last 24 hours:  Temp:  [96.9 F (36.1 C)-99.6 F (37.6 C)] 98.4 F (36.9 C) (05/25 0507) Pulse Rate:  [82-98] 82 (05/25 0507) Resp:  [9-23] 15 (05/25 0507) BP: (104-158)/(66-89) 124/67 (05/25 0507) SpO2:  [99 %-100 %] 99 % (05/25 0507) Weight:  [186 lb 11.7 oz (84.7 kg)-191 lb 2.2 oz (86.7 kg)] 187 lb 2.7 oz (84.9 kg) (05/24 2144)  Weight change: -13.8 oz (-0.391 kg) Filed Weights   09/07/16 1450 09/07/16 1700 09/07/16 2144  Weight: 191 lb 2.2 oz (86.7 kg) 186 lb 11.7 oz (84.7 kg) 187 lb 2.7 oz (84.9 kg)    Intake/Output: I/O last 3 completed shifts: In: 1127 [P.O.:702; I.V.:425] Out: 2015 [Other:2000; Blood:15]   Intake/Output this shift:  No intake/output data recorded.  CVS- RRR RS- CTA ABD- BS present soft non-distended EXT- no edema   Basic Metabolic Panel:  Recent Labs Lab 09/04/16 1243 09/04/16 2106 09/05/16 0705 09/06/16 0614 09/07/16 0408 09/07/16 1110  NA 135  --  137 137 136 137  K 4.7  --  4.2 3.8 4.8 4.6  CL 101  --  108 106 107 108  CO2 23  --  23 24 22  19*  GLUCOSE 302*  --  205* 65 126* 169*  BUN 48*  --  43* 33* 36* 35*  CREATININE 6.92* 6.75* 7.09* 6.69* 7.07* 6.92*  CALCIUM 9.2  --  8.5* 8.4* 8.5* 8.4*  PHOS  --   --  3.9  --   --  3.9    Liver Function Tests:  Recent Labs Lab 09/04/16 1243 09/05/16 0705  09/07/16 1110 09/07/16 1453  AST 20  --   --   --   ALT 19  --   --  15  ALKPHOS 69  --   --   --   BILITOT 0.4  --   --   --   PROT 7.2  --   --   --   ALBUMIN 3.9 3.0* 2.9*  --     Recent Labs Lab 09/04/16 1243  LIPASE 24   No results for input(s): AMMONIA in the last 168 hours.  CBC:  Recent Labs Lab 09/04/16 2106 09/05/16 0705 09/06/16 0614 09/07/16 0408 09/07/16 1110  WBC 6.3 6.4 5.4 6.0 6.2  HGB 8.4* 7.8* 7.5* 7.9* 8.0*  HCT 25.7* 24.3* 23.3* 24.9* 25.1*  MCV 88.9 90.3 89.6 91.9 90.9  PLT 281 265 256 283 241    Cardiac Enzymes: No results for input(s): CKTOTAL, CKMB, CKMBINDEX, TROPONINI in the last 168 hours.  BNP: Invalid input(s): POCBNP  CBG:  Recent Labs Lab 09/07/16 1058 09/07/16 1727 09/07/16 2142 09/08/16 0744 09/08/16 0825  GLUCAP 171* 182* 122* 28* 2*    Microbiology: Results for orders placed or performed during the hospital encounter of 09/04/16  Surgical pcr screen     Status: None   Collection Time: 09/06/16 11:28 PM  Result Value Ref Range Status  MRSA, PCR NEGATIVE NEGATIVE Final   Staphylococcus aureus NEGATIVE NEGATIVE Final    Comment:        The Xpert SA Assay (FDA approved for NASAL specimens in patients over 66 years of age), is one component of a comprehensive surveillance program.  Test performance has been validated by [facility] for patients greater than or equal to 25 year old. It is not intended to diagnose infection nor to guide or monitor treatment.     Coagulation Studies:  Recent Labs  09/07/16 0408  LABPROT 13.0  INR 0.98    Urinalysis: No results for input(s): COLORURINE, LABSPEC, PHURINE, GLUCOSEU, HGBUR, BILIRUBINUR, KETONESUR, PROTEINUR, UROBILINOGEN, NITRITE, LEUKOCYTESUR in the last 72 hours.  Invalid input(s): APPERANCEUR    Imaging: Dg Chest 1 View  Result Date: 09/07/2016 CLINICAL DATA:  Diatek catheter placement. EXAM: CHEST 1 VIEW COMPARISON:  11/21/2012 chest radiograph  FINDINGS: A single intraoperative view of the upper right chest is submitted postoperatively for interpretation. A right IJ central venous catheter is noted with tip overlying the superior cavoatrial junction. No definite pneumothorax noted. IMPRESSION: Right IJ central venous catheter with tip overlying the superior cavoatrial junction. Electronically Signed   By: Margarette Canada M.D.   On: 09/07/2016 10:24   Dg Chest Port 1 View  Result Date: 09/07/2016 CLINICAL DATA:  36 year old female with a history of dialysis catheter insertion EXAM: PORTABLE CHEST 1 VIEW COMPARISON:  11/21/2012 FINDINGS: Compare to the prior plain film of 11/21/2012 there is increased size of the cardiomediastinal silhouette. Reduced lung volumes. Interval placement of right IJ approach hemodialysis catheter with the tip appearing to terminate at the superior cavoatrial junction. No pneumothorax. No pleural effusion. No confluent airspace disease. IMPRESSION: Interval placement of right IJ approach hemodialysis catheter. No complicating features or evidence of acute cardiopulmonary disease. Since the plain film of 2014 there has been interval enlargement of the cardiomediastinal silhouette, which may be accentuated by the low lung volumes. Electronically Signed   By: Corrie Mckusick D.O.   On: 09/07/2016 10:22   Dg Fluoro Guide Cv Line Right  Result Date: 09/07/2016 Fluoroscopy was utilized by the requesting physician.  No radiographic interpretation.     Medications:    . bisacodyl  10 mg Rectal Daily  . carbamazepine  200 mg Oral QPM  . darbepoetin (ARANESP) injection - NON-DIALYSIS  100 mcg Subcutaneous Q Tue-1800  . docusate sodium  200 mg Oral BID  . gabapentin  300 mg Oral QHS  . heparin  5,000 Units Subcutaneous Q8H  . hydrALAZINE  50 mg Oral TID  . insulin aspart  0-9 Units Subcutaneous TID WC  . insulin aspart  3 Units Subcutaneous TID WC  . insulin glargine  5 Units Subcutaneous q morning - 10a  . labetalol  400  mg Oral BID  . ondansetron (ZOFRAN) IV  4 mg Intravenous Q8H  . pantoprazole  40 mg Oral Daily  . polyethylene glycol  17 g Oral Daily  . 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 We shall proceed  With CLIP process  Appreciiate Dr Nicole Cella help     HTN Appears labile will follow and will adjust antihypertensives   Diabetes controlled with insulin  Bones  PTH 109  Anemia  Iron  stores good start aranesp 152mcg  5/25    LOS: 4 [place] Alter W @TODAY @9 :10 AM

## 2016-09-08 NOTE — Care Management Note (Addendum)
Case Management Note  Patient Details  Name: Carrie [name] MRN: [id number] Date of Birth: 1981/03/04  Subjective/Objective:       CM following for progression and d/c planning.              Action/Plan: 09/08/2016 New start HD on 09/07/2016 , TDC placed and vascular following for per access.  Will continue to follow for d/c needs.  Noted that pt lives with family  Expected Discharge Date:   (unknown)               Expected Discharge Plan:  Home/Self Care  In-House Referral:  NA  Discharge planning Services  CM Consult  Post Acute Care Choice:  NA Choice offered to:  NA  DME Arranged:    DME Agency:     HH Arranged:    [place] Agency:     Status of Service:  In process, will continue to follow  If discussed at Long Length of Stay Meetings, dates discussed:    Additional Comments:  Adron Bene, RN 09/08/2016, 1:55 PM

## 2016-09-09 LAB — GLUCOSE, CAPILLARY
GLUCOSE-CAPILLARY: 189 mg/dL — AB (ref 65–99)
Glucose-Capillary: 118 mg/dL — ABNORMAL HIGH (ref 65–99)
Glucose-Capillary: 143 mg/dL — ABNORMAL HIGH (ref 65–99)
Glucose-Capillary: 145 mg/dL — ABNORMAL HIGH (ref 65–99)
Glucose-Capillary: 172 mg/dL — ABNORMAL HIGH (ref 65–99)
Glucose-Capillary: 207 mg/dL — ABNORMAL HIGH (ref 65–99)

## 2016-09-09 LAB — BASIC METABOLIC PANEL
Anion gap: 8 (ref 5–15)
BUN: 34 mg/dL — AB (ref 6–20)
CO2: 23 mmol/L (ref 22–32)
CREATININE: 6.81 mg/dL — AB (ref 0.44–1.00)
Calcium: 8.5 mg/dL — ABNORMAL LOW (ref 8.9–10.3)
Chloride: 104 mmol/L (ref 101–111)
GFR calc Af Amer: 8 mL/min — ABNORMAL LOW (ref >60)
GFR, EST NON AFRICAN AMERICAN: 7 mL/min — AB (ref >60)
GLUCOSE: 141 mg/dL — AB (ref 65–99)
Potassium: 4.3 mmol/L (ref 3.5–5.1)
SODIUM: 135 mmol/L (ref 135–145)

## 2016-09-09 LAB — CBC
HCT: 24.3 % — ABNORMAL LOW (ref 36.0–46.0)
Hemoglobin: 7.6 g/dL — ABNORMAL LOW (ref 12.0–15.0)
MCH: 28.8 pg (ref 26.0–34.0)
MCHC: 31.3 g/dL (ref 30.0–36.0)
MCV: 92 fL (ref 78.0–100.0)
PLATELETS: 237 10*3/uL (ref 150–400)
RBC: 2.64 MIL/uL — ABNORMAL LOW (ref 3.87–5.11)
RDW: 14.1 % (ref 11.5–15.5)
WBC: 7.4 10*3/uL (ref 4.0–10.5)

## 2016-09-09 MED ORDER — KIDNEY FAILURE BOOK
Freq: Once | Status: AC
  Administered 2016-09-09: 05:00:00
  Filled 2016-09-09: qty 1

## 2016-09-09 MED ORDER — ONDANSETRON HCL 4 MG/2ML IJ SOLN
INTRAMUSCULAR | Status: AC
  Filled 2016-09-09: qty 2

## 2016-09-09 NOTE — Progress Notes (Signed)
HD contacted regarding whether or not patient is on the schedule for HD today. HD stated that she was currently not on the schedule. Patient states that MD stated that she would be going to HD today. HD nurse, Lenna Sciara, stated to hold all B/P and daily meds until we knew for sure that patient would be going. Orders followed. Will continue to monitor.

## 2016-09-09 NOTE — Progress Notes (Signed)
[facility] ROUNDING NOTE   Subjective:   Interval History:HPI:Carrie [name] a 36 y.o.femalewith medical history significant of stage V chronic kidney disease, type 1 diabetes, trigeminal neuralgia, peripheral neuropathy who presented to the hospital (third visit this month) for evaluation of the above-noted complaints. Per patient, she has a known history of gastroparesis-and was maintained on Reglan for approximately 1 year. Creatinine 1/18 2.5 --- 5.6 --- 7  Greater than 300 mg proteinuria CT abdomen no hydronephrosis. Patient had AVG and catheter placed  5/24   Objective:  Vital signs in last 24 hours:  Temp:  [98.5 F (36.9 C)-99.6 F (37.6 C)] 98.7 F (37.1 C) (05/26 1000) Pulse Rate:  [87-94] 90 (05/26 1000) Resp:  [14-16] 16 (05/26 1000) BP: (110-146)/(54-73) 146/73 (05/26 1000) SpO2:  [98 %-100 %] 99 % (05/26 1000) Weight:  [187 lb 6.3 oz (85 kg)] 187 lb 6.3 oz (85 kg) (05/25 2122)  Weight change: -3 lb 12 oz (-1.7 kg) Filed Weights   09/07/16 1700 09/07/16 2144 09/08/16 2122  Weight: 186 lb 11.7 oz (84.7 kg) 187 lb 2.7 oz (84.9 kg) 187 lb 6.3 oz (85 kg)    Intake/Output: I/O last 3 completed shifts: In: 77 [P.O.:1022] Out: 250 [Urine:250]   Intake/Output this shift:  Total I/O In: 420 [P.O.:420] Out: -   CVS- RRR RS- CTA ABD- BS present soft non-distended EXT- no edema   Basic Metabolic Panel:  Recent Labs Lab 09/05/16 0705 09/06/16 0614 09/07/16 0408 09/07/16 1110 09/09/16 0503  NA 137 137 136 137 135  K 4.2 3.8 4.8 4.6 4.3  CL 108 106 107 108 104  CO2 23 24 22  19* 23  GLUCOSE 205* 65 126* 169* 141*  BUN 43* 33* 36* 35* 34*  CREATININE 7.09* 6.69* 7.07* 6.92* 6.81*  CALCIUM 8.5* 8.4* 8.5* 8.4* 8.5*  PHOS 3.9  --   --  3.9  --     Liver Function Tests:  Recent Labs Lab 09/04/16 1243 09/05/16 0705 09/07/16 1110 09/07/16 1453  AST 20  --   --   --   ALT 19  --   --  15  ALKPHOS 69  --   --   --   BILITOT 0.4   --   --   --   PROT 7.2  --   --   --   ALBUMIN 3.9 3.0* 2.9*  --     Recent Labs Lab 09/04/16 1243  LIPASE 24   No results for input(s): AMMONIA in the last 168 hours.  CBC:  Recent Labs Lab 09/05/16 0705 09/06/16 0614 09/07/16 0408 09/07/16 1110 09/09/16 0503  WBC 6.4 5.4 6.0 6.2 7.4  HGB 7.8* 7.5* 7.9* 8.0* 7.6*  HCT 24.3* 23.3* 24.9* 25.1* 24.3*  MCV 90.3 89.6 91.9 90.9 92.0  PLT 265 256 283 241 237    Cardiac Enzymes: No results for input(s): CKTOTAL, CKMB, CKMBINDEX, TROPONINI in the last 168 hours.  BNP: Invalid input(s): POCBNP  CBG:  Recent Labs Lab 09/08/16 1625 09/08/16 2009 09/09/16 0028 09/09/16 0416 09/09/16 0750  GLUCAP 164* 133* 172* 145* 118*    Microbiology: Results for orders placed or performed during the hospital encounter of 09/04/16  Surgical pcr screen     Status: None   Collection Time: 09/06/16 11:28 PM  Result Value Ref Range Status   MRSA, PCR NEGATIVE NEGATIVE Final   Staphylococcus aureus NEGATIVE NEGATIVE Final    Comment:        The Xpert SA  Assay (FDA approved for NASAL specimens in patients over 1 years of age), is one component of a comprehensive surveillance program.  Test performance has been validated by [facility] for patients greater than or equal to 32 year old. It is not intended to diagnose infection nor to guide or monitor treatment.     Coagulation Studies:  Recent Labs  09/07/16 0408  LABPROT 13.0  INR 0.98    Urinalysis: No results for input(s): COLORURINE, LABSPEC, PHURINE, GLUCOSEU, HGBUR, BILIRUBINUR, KETONESUR, PROTEINUR, UROBILINOGEN, NITRITE, LEUKOCYTESUR in the last 72 hours.  Invalid input(s): APPERANCEUR    Imaging: No results found.   Medications:    . bisacodyl  10 mg Rectal Daily  . carbamazepine  200 mg Oral QPM  . darbepoetin (ARANESP) injection - NON-DIALYSIS  100 mcg Subcutaneous Q Tue-1800  . docusate sodium  200 mg Oral BID  . gabapentin  300 mg Oral QHS  .  heparin  5,000 Units Subcutaneous Q8H  . hydrALAZINE  50 mg Oral TID  . insulin aspart  0-9 Units Subcutaneous TID WC  . insulin aspart  3 Units Subcutaneous TID WC  . insulin glargine  5 Units Subcutaneous q morning - 10a  . labetalol  400 mg Oral BID  . ondansetron (ZOFRAN) IV  4 mg Intravenous Q8H  . pantoprazole  40 mg Oral Daily  . polyethylene glycol  17 g Oral Daily  . 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 We shall proceed with preparations for dialysis and will have a permcath and vascular access studies Appreciiate Dr Nicole Cella help  Plan dialysis 5/25 and will continue today 5/26   HTN Appears labile will follow and will adjust antihypertensives   Diabetes controlled with insulin  Bones  PTH 109  Anemia  Iron stores good     LOS: 5 Andrew Soria W @TODAY @11 :22 AM

## 2016-09-09 NOTE — Progress Notes (Signed)
PROGRESS NOTE    Carrie [name]  MHD:[id number] DOB: Oct 27, 1980 DOA: 09/04/2016 PCP: Annetta Maw, MD   Brief Narrative: 36 y.o.femalewith medical history significant of stage V chronic kidney disease, type 1 diabetes, trigeminal neuralgia, peripheral neuropathy who presented to the hospital (third visit this month) for evaluation of the nausea vomiting and abdominal pain. Per patient, she has a known history of gastroparesis-and was maintained on Reglan for approximately 1 year. Approximately 2 months back, Reglan was discontinued by her primary nephrologist and primary care practitioner due to concerns of early tardive dyskinesia. She then started having intermittent nausea and vomiting, that has gradually worsened, workup in the ER showed CT scan with moderate stool burden but no acute findings, creatinine close to 7, she was admitted for persistent nausea, vomiting, now likely ESRD with possible dialysis need.  Assessment & Plan:  #  Chronic kidney disease is stage V now progressed to ESRD: -Status post tunneled catheter placement and left upper extremity AV fistula creation by vascular surgery on 5/24. Initiated hemodialysis treatment by nephrologist.  -Further dialysis related treatment as per nephrologist. Plan for another hemodialysis today and outpatient hemodialysis center evaluation ongoing. I discussed with Dr. Justin Mend from nephrologist today. -Continue to monitor BMP, blood pressure and electrolytes.  -Both vascular and nephrology consult appreciated.   #Diabetic gastroparesis with chronic nausea and vomiting: Unable to tolerate Reglan due to side effect of tardive dyskinesia. CT scan of abdomen and pelvis with stone burden.  -Clinically improving. Continue diet and supportive care.  #Hypertension: Continue to monitor blood pressure. Continue hydralazine, labetalol. Discontinued Norvasc after initiation of hemodialysis. May need to further taper down blood pressure medication  after initiation of hemodialysis treatment.  #Anemia of chronic kidney disease: Defer to nephrology for IV iron and Aranesp treatment during dialysis. Monitor CBC  #History of trigeminal neuralgia: Continue Tegretol  #Type 1 diabetes with Diabetic peripheral neuropathy: Blood sugar level better controlled. Currently on lower dose of Lantus. Continue to monitor.  A1c 6.8.  #Acid reflux: Continue Protonix. 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    Consultants:   Nephrology  Vascular surgery  Procedures: None Antimicrobials: None  Subjective: Seen and examined at bedside. No new event. Denied headache, dizziness, nausea, vomiting chest pain or shortness of breath. Objective: Vitals:   09/08/16 1705 09/08/16 2122 09/09/16 0420 09/09/16 1000  BP: 121/63 112/65 (!) 110/54 (!) 146/73  Pulse: 92 94 87 90  Resp: 16 16 14 16   Temp: 99.2 F (37.3 C) 99.6 F (37.6 C) 98.5 F (36.9 C) 98.7 F (37.1 C)  TempSrc: Oral Oral Oral Oral  SpO2: 98% 100% 99% 99%  Weight:  85 kg (187 lb 6.3 oz)    Height:        Intake/Output Summary (Last 24 hours) at 09/09/16 1126 Last data filed at 09/09/16 0825  Gross per 24 hour  Intake             1082 ml  Output              250 ml  Net  832 ml   Filed Weights   09/07/16 1700 09/07/16 2144 09/08/16 2122  Weight: 84.7 kg (186 lb 11.7 oz) 84.9 kg (187 lb 2.7 oz) 85 kg (187 lb 6.3 oz)    Examination:  General exam: Not in distress Respiratory system: Clear bilateral. Respiratory effort normal. No wheezing or crackle Cardiovascular system:  Regular rate rhythm, S1 and S2 normal. No pedal edema  Gastrointestinal system: Abdomen soft, nontender, nondistended. Bowel sound positive  Central nervous system: Alert and oriented. No focal neurological deficits. Extremities: Symmetric 5 x 5 power. AV fistula site has no bleeding or discharge Skin: No rashes, lesions or ulcers Tunneled catheter site clean   Data Reviewed: I have personally reviewed following labs and imaging studies  CBC:  Recent Labs Lab 09/05/16 0705 09/06/16 0614 09/07/16 0408 09/07/16 1110 09/09/16 0503  WBC 6.4 5.4 6.0 6.2 7.4  HGB 7.8* 7.5* 7.9* 8.0* 7.6*  HCT 24.3* 23.3* 24.9* 25.1* 24.3*  MCV 90.3 89.6 91.9 90.9 92.0  PLT 265 256 283 241 833   Basic Metabolic Panel:  Recent Labs Lab 09/05/16 0705 09/06/16 0614 09/07/16 0408 09/07/16 1110 09/09/16 0503  NA 137 137 136 137 135  K 4.2 3.8 4.8 4.6 4.3  CL 108 106 107 108 104  CO2 23 24 22  19* 23  GLUCOSE 205* 65 126* 169* 141*  BUN 43* 33* 36* 35* 34*  CREATININE 7.09* 6.69* 7.07* 6.92* 6.81*  CALCIUM 8.5* 8.4* 8.5* 8.4* 8.5*  PHOS 3.9  --   --  3.9  --    GFR: Estimated Creatinine Clearance: 13.4 mL/min (A) (by C-G formula based on SCr of 6.81 mg/dL (H)). Liver Function Tests:  Recent Labs Lab 09/04/16 1243 09/05/16 0705 09/07/16 1110 09/07/16 1453  AST 20  --   --   --   ALT 19  --   --  15  ALKPHOS 69  --   --   --   BILITOT 0.4  --   --   --   PROT 7.2  --   --   --   ALBUMIN 3.9 3.0* 2.9*  --     Recent Labs Lab 09/04/16 1243  LIPASE 24   No results for input(s): AMMONIA in the last 168 hours. Coagulation Profile:  Recent Labs Lab 09/07/16 0408  INR 0.98   Cardiac Enzymes: No results for input(s): CKTOTAL, CKMB, CKMBINDEX, TROPONINI in the last 168 hours. BNP (last 3 results) No results for input(s): PROBNP in the last 8760 hours. HbA1C: No results for input(s): HGBA1C in the last 72 hours. CBG:  Recent Labs Lab 09/08/16 1625 09/08/16 2009  09/09/16 0028 09/09/16 0416 09/09/16 0750  GLUCAP 164* 133* 172* 145* 118*   Lipid Profile: No results for input(s): CHOL, HDL, LDLCALC, TRIG, CHOLHDL, LDLDIRECT in the last 72 hours. Thyroid Function Tests: No results for input(s): TSH, T4TOTAL, FREET4, T3FREE, THYROIDAB in the last 72 hours. Anemia Panel: No results for input(s): VITAMINB12, FOLATE, FERRITIN, TIBC, IRON, RETICCTPCT in the last 72 hours. Sepsis Labs: No results for input(s): PROCALCITON, LATICACIDVEN in the last 168 hours.  Recent Results (from the past 240 hour(s))  Surgical pcr screen     Status: None   Collection Time: 09/06/16 11:28 PM  Result Value Ref Range Status   MRSA, PCR NEGATIVE NEGATIVE Final   Staphylococcus aureus NEGATIVE NEGATIVE Final    Comment:        The Xpert SA Assay (FDA approved for NASAL specimens in patients over 21 years  of age), is one component of a comprehensive surveillance program.  Test performance has been validated by [facility] for patients greater than or equal to 26 year old. It is not intended to diagnose infection nor to guide or monitor treatment.          Radiology Studies: No results found.      Scheduled Meds: . bisacodyl  10 mg Rectal Daily  . carbamazepine  200 mg Oral QPM  . darbepoetin (ARANESP) injection - NON-DIALYSIS  100 mcg Subcutaneous Q Tue-1800  . docusate sodium  200 mg Oral BID  . gabapentin  300 mg Oral QHS  . heparin  5,000 Units Subcutaneous Q8H  . hydrALAZINE  50 mg Oral TID  . insulin aspart  0-9 Units Subcutaneous TID WC  . insulin aspart  3 Units Subcutaneous TID WC  . insulin glargine  5 Units Subcutaneous q morning - 10a  . labetalol  400 mg Oral BID  . ondansetron (ZOFRAN) IV  4 mg Intravenous Q8H  . pantoprazole  40 mg Oral Daily  . polyethylene glycol  17 g Oral Daily  . sevelamer carbonate  800 mg Oral TID WC   Continuous Infusions:    LOS: 5 days    Dron Tanna Furry, MD [facility] Pager  [phone]  If 7PM-7AM, please contact night-coverage [email or web address] Password [email or web address] 09/09/2016, 11:26 AM

## 2016-09-09 NOTE — Plan of Care (Signed)
Problem: Food- and Nutrition-Related Knowledge Deficit (NB-1.1) Goal: Nutrition education Formal process to instruct or train a patient/client in a skill or to impart knowledge to help patients/clients voluntarily manage or modify food choices and eating behavior to maintain or improve health. Outcome: Adequate for Discharge Nutrition Education Note  RD consulted for Renal Education. Handouts Provided included Choose-A-Meal Booklet and the Renal Pyramid.   Discussed with patient why it is important to watch Na, K, phos intake and  How these can affect heart, bones, swelling.  Went through diet recall.  Breakfast: Skips Lunch: Fast food vs piece of fruit Dinner: Baked chicken/fish, broccoli, salada, cucumber with ranch Bevs: Diet mt dew  Sodium: Stated how fast food is often extremely high in sodium. This coupled with unnecessarily large portion sizes make fast food a poor choice. She is best served with having home cooked meals. If she must eat out, choose a salad.   Potassium: Went over high K foods. High K foods that she reported eating with frequency included potatoes, collards, turnip greens, spinach, bananas, Dairy. Described how to leach potatoes. Apple/Grape juice only. Recommended starting with only consuming 1 high K food/day, until she is established as outpatient w/ baseline labs.   Phosphorus. Explained tight restriction on dairy as these are high in both K and phos. Gave serving recs. She should not consume dark sodas or nuts/PB. Recommend only starting with consuming 1 high Phos food/day until she is establish as outpatient w/ baseline labs.   Emphasized importance of protein and not skipping meals. SHe has had issues with hypoglycemia in past.   Told pt that every HD facility has an RD on staff and this will be her best resource. Reports understanding.   Summary of recommendations:  1. Eat 3x a day 2. Eliminate/reduce frequency of fast food 3. Follow Renal/Dm  diet-discussed in detail  Expect Poor-fair compliance. Pt had poor diet only with DM restrictions. Now she needs to follow renal AND dm restrictions. She has a very poor diet at baseline. Was not as engaged in conversation as had hoped. Could not make many personalized diet recommendations due to few foods mentioned in recall.   Body mass index is 28.49 kg/m. Pt meets criteria for Overweight based on current BMI.  Current diet order is Renal/Carb mod, Patient meal intake is highly variable at this time.    No further nutrition interventions warranted at this time. If additional nutrition issues arise, please re-consult RD.  Burtis Junes RD, LDN, CNSC Clinical Nutrition Pager: [phone] 09/09/2016 10:32 AM

## 2016-09-10 LAB — RENAL FUNCTION PANEL
ANION GAP: 6 (ref 5–15)
Albumin: 2.7 g/dL — ABNORMAL LOW (ref 3.5–5.0)
BUN: 23 mg/dL — ABNORMAL HIGH (ref 6–20)
CALCIUM: 8.4 mg/dL — AB (ref 8.9–10.3)
CO2: 26 mmol/L (ref 22–32)
CREATININE: 5.13 mg/dL — AB (ref 0.44–1.00)
Chloride: 100 mmol/L — ABNORMAL LOW (ref 101–111)
GFR calc Af Amer: 12 mL/min — ABNORMAL LOW (ref >60)
GFR, EST NON AFRICAN AMERICAN: 10 mL/min — AB (ref >60)
Glucose, Bld: 221 mg/dL — ABNORMAL HIGH (ref 65–99)
PHOSPHORUS: 3.3 mg/dL (ref 2.5–4.6)
Potassium: 4.2 mmol/L (ref 3.5–5.1)
SODIUM: 132 mmol/L — AB (ref 135–145)

## 2016-09-10 LAB — CBC
HCT: 24.3 % — ABNORMAL LOW (ref 36.0–46.0)
HEMOGLOBIN: 7.6 g/dL — AB (ref 12.0–15.0)
MCH: 28.5 pg (ref 26.0–34.0)
MCHC: 31.3 g/dL (ref 30.0–36.0)
MCV: 91 fL (ref 78.0–100.0)
PLATELETS: 214 10*3/uL (ref 150–400)
RBC: 2.67 MIL/uL — AB (ref 3.87–5.11)
RDW: 13.9 % (ref 11.5–15.5)
WBC: 7.9 10*3/uL (ref 4.0–10.5)

## 2016-09-10 LAB — GLUCOSE, CAPILLARY
GLUCOSE-CAPILLARY: 241 mg/dL — AB (ref 65–99)
GLUCOSE-CAPILLARY: 124 mg/dL — AB (ref 65–99)
GLUCOSE-CAPILLARY: 160 mg/dL — AB (ref 65–99)
GLUCOSE-CAPILLARY: 140 mg/dL — AB (ref 65–99)
GLUCOSE-CAPILLARY: 176 mg/dL — AB (ref 65–99)

## 2016-09-10 MED ORDER — LIDOCAINE HCL (PF) 1 % IJ SOLN
5.0000 mL | INTRAMUSCULAR | Status: DC | PRN
  Filled 2016-09-10: qty 5

## 2016-09-10 MED ORDER — HEPARIN SODIUM (PORCINE) 1000 UNIT/ML DIALYSIS: 1000.0000 [IU] | INTRAMUSCULAR | Status: DC | PRN

## 2016-09-10 MED ORDER — LIDOCAINE-PRILOCAINE 2.5-2.5 % EX CREA: 1.0000 "application " | TOPICAL_CREAM | CUTANEOUS | Status: DC | PRN

## 2016-09-10 MED ORDER — SODIUM CHLORIDE 0.9 % IV SOLN: 100.0000 mL | INTRAVENOUS | Status: DC | PRN

## 2016-09-10 MED ORDER — ALTEPLASE 2 MG IJ SOLR: 2.0000 mg | Freq: Once | INTRAMUSCULAR | Status: DC | PRN

## 2016-09-10 MED ORDER — PENTAFLUOROPROP-TETRAFLUOROETH EX AERO: 1.0000 "application " | INHALATION_SPRAY | CUTANEOUS | Status: DC | PRN

## 2016-09-10 NOTE — Progress Notes (Signed)
[facility] ROUNDING NOTE   Subjective:   Interval History:HPI:Samiyyah Y Cookis a 36 y.o.femalewith medical history significant of stage V chronic kidney disease, type 1 diabetes, trigeminal neuralgia, peripheral neuropathy who presented to the hospital (third visit this month) for evaluation of the above-noted complaints. Per patient, she has a known history of gastroparesis-and was maintained on Reglan for approximately 1 year. Creatinine 1/18 2.5 --- 5.6 --- 7  Greater than 300 mg proteinuria CT abdomen no hydronephrosis. Patient had AVG and catheter placed  5/24   Objective:  Vital signs in last 24 hours:  Temp:  [98.2 F (36.8 C)-98.8 F (37.1 C)] 98.8 F (37.1 C) (05/27 0205) Pulse Rate:  [86-100] 100 (05/27 0306) Resp:  [16-18] 16 (05/27 0306) BP: (118-170)/(71-93) 118/74 (05/27 0306) SpO2:  [98 %-100 %] 100 % (05/27 0306) Weight:  [186 lb 1.1 oz (84.4 kg)-190 lb 7.6 oz (86.4 kg)] 186 lb 1.1 oz (84.4 kg) (05/27 0205)  Weight change: 3 lb 1.4 oz (1.4 kg) Filed Weights   09/08/16 2122 09/09/16 2335 09/10/16 0205  Weight: 187 lb 6.3 oz (85 kg) 190 lb 7.6 oz (86.4 kg) 186 lb 1.1 oz (84.4 kg)    Intake/Output: I/O last 3 completed shifts: In: 1100 [P.O.:1100] Out: 6010 [Urine:1050; Other:2000]   Intake/Output this shift:  No intake/output data recorded.  CVS- RRR RS- CTA ABD- BS present soft non-distended EXT- no edema   Basic Metabolic Panel:  Recent Labs Lab 09/05/16 0705 09/06/16 0614 09/07/16 0408 09/07/16 1110 09/09/16 0503 09/10/16 0709  NA 137 137 136 137 135 132*  K 4.2 3.8 4.8 4.6 4.3 4.2  CL 108 106 107 108 104 100*  CO2 23 24 22  19* 23 26  GLUCOSE 205* 65 126* 169* 141* 221*  BUN 43* 33* 36* 35* 34* 23*  CREATININE 7.09* 6.69* 7.07* 6.92* 6.81* 5.13*  CALCIUM 8.5* 8.4* 8.5* 8.4* 8.5* 8.4*  PHOS 3.9  --   --  3.9  --  3.3    Liver Function Tests:  Recent Labs Lab 09/04/16 1243 09/05/16 0705 09/07/16 1110  09/07/16 1453 09/10/16 0709  AST 20  --   --   --   --   ALT 19  --   --  15  --   ALKPHOS 69  --   --   --   --   BILITOT 0.4  --   --   --   --   PROT 7.2  --   --   --   --   ALBUMIN 3.9 3.0* 2.9*  --  2.7*    Recent Labs Lab 09/04/16 1243  LIPASE 24   No results for input(s): AMMONIA in the last 168 hours.  CBC:  Recent Labs Lab 09/06/16 0614 09/07/16 0408 09/07/16 1110 09/09/16 0503 09/10/16 0709  WBC 5.4 6.0 6.2 7.4 7.9  HGB 7.5* 7.9* 8.0* 7.6* 7.6*  HCT 23.3* 24.9* 25.1* 24.3* 24.3*  MCV 89.6 91.9 90.9 92.0 91.0  PLT 256 283 241 237 214    Cardiac Enzymes: No results for input(s): CKTOTAL, CKMB, CKMBINDEX, TROPONINI in the last 168 hours.  BNP: Invalid input(s): POCBNP  CBG:  Recent Labs Lab 09/09/16 1210 09/09/16 1653 09/09/16 2009 09/10/16 0412 09/10/16 0834  GLUCAP 143* 207* 189* 241* 46*    Microbiology: Results for orders placed or performed during the hospital encounter of 09/04/16  Surgical pcr screen     Status: None   Collection Time: 09/06/16 11:28 PM  Result Value  Ref Range Status   MRSA, PCR NEGATIVE NEGATIVE Final   Staphylococcus aureus NEGATIVE NEGATIVE Final    Comment:        The Xpert SA Assay (FDA approved for NASAL specimens in patients over 61 years of age), is one component of a comprehensive surveillance program.  Test performance has been validated by [facility] for patients greater than or equal to 28 year old. It is not intended to diagnose infection nor to guide or monitor treatment.     Coagulation Studies: No results for input(s): LABPROT, INR in the last 72 hours.  Urinalysis: No results for input(s): COLORURINE, LABSPEC, PHURINE, GLUCOSEU, HGBUR, BILIRUBINUR, KETONESUR, PROTEINUR, UROBILINOGEN, NITRITE, LEUKOCYTESUR in the last 72 hours.  Invalid input(s): APPERANCEUR    Imaging: No results found.   Medications:   . sodium chloride    . sodium chloride     . bisacodyl  10 mg Rectal  Daily  . carbamazepine  200 mg Oral QPM  . darbepoetin (ARANESP) injection - NON-DIALYSIS  100 mcg Subcutaneous Q Tue-1800  . docusate sodium  200 mg Oral BID  . gabapentin  300 mg Oral QHS  . heparin  5,000 Units Subcutaneous Q8H  . hydrALAZINE  50 mg Oral TID  . insulin aspart  0-9 Units Subcutaneous TID WC  . insulin aspart  3 Units Subcutaneous TID WC  . insulin glargine  5 Units Subcutaneous q morning - 10a  . labetalol  400 mg Oral BID  . ondansetron (ZOFRAN) IV  4 mg Intravenous Q8H  . pantoprazole  40 mg Oral Daily  . polyethylene glycol  17 g Oral Daily  . sevelamer carbonate  800 mg Oral TID WC   sodium chloride, sodium chloride, acetaminophen **OR** acetaminophen, albuterol, alteplase, heparin, hydrALAZINE, lidocaine (PF), lidocaine-prilocaine, oxyCODONE, pentafluoroprop-tetrafluoroeth, promethazine  Assessment/ Plan:   Chronic Renal Disease secondary to diabetic nephropathy Creatinine has gradually worsened to about 7 I suspect that this is an irreversible consequence of her diabetic renal disease. We shall proceed with preparations for dialysis and will have a permcath and vascular access studies Appreciiate Dr Nicole Cella help Plan dialysis 5/25  5/26  We shall plan dialysis Tuesday 5/27  HTN Appears labile will follow and will adjust antihypertensives   Diabetes controlled with insulin  Bones PTH 109  Anemia Iron stores good    LOS: 6 Ziaire Hagos W @TODAY @9 :21 AM

## 2016-09-10 NOTE — Progress Notes (Signed)
PROGRESS NOTE    Carrie [name]  ZOX:[id number] DOB: 08-Apr-1981 DOA: 09/04/2016 PCP: Annetta Maw, MD   Brief Narrative: 36 y.o.femalewith medical history significant of stage V chronic kidney disease, type 1 diabetes, trigeminal neuralgia, peripheral neuropathy who presented to the hospital (third visit this month) for evaluation of the nausea vomiting and abdominal pain. Per patient, she has a known history of gastroparesis-and was maintained on Reglan for approximately 1 year. Approximately 2 months back, Reglan was discontinued by her primary nephrologist and primary care practitioner due to concerns of early tardive dyskinesia. She then started having intermittent nausea and vomiting, that has gradually worsened, workup in the ER showed CT scan with moderate stool burden but no acute findings, creatinine close to 7, she was admitted for persistent nausea, vomiting, now likely ESRD with possible dialysis need.  Assessment & Plan:  #  Chronic kidney disease is stage V now progressed to ESRD: -Status post tunneled catheter placement and left upper extremity AV fistula creation by vascular surgery on 5/24. Initiated hemodialysis treatment by nephrologist.  -Initiated hemodialysis treatment. Discussed with the nephrologist today. Plan for outpatient hemodialysis set up going on. No change in his clinical condition today. -Continue to monitor BMP, blood pressure and electrolytes.  -Both vascular and nephrology consult appreciated.   #Diabetic gastroparesis with chronic nausea and vomiting: Unable to tolerate Reglan due to side effect of tardive dyskinesia. CT scan of abdomen and pelvis with stone burden.  -Clinically stable. Continue diet and supportive care. Tolerating diet  #Hypertension:  Continue hydralazine, labetalol. Discontinued Norvasc after initiation of hemodialysis. Continue to monitor blood pressure. May need to adjust the medication depending on BP readings.  #Anemia of  chronic kidney disease: Hemoglobin is stable. Monitor CBC. on Aranesp treatment during dialysis.   #History of trigeminal neuralgia: Continue Tegretol  #Type 1 diabetes with Diabetic peripheral neuropathy: Blood sugar level better controlled. Currently on lower dose of Lantus. Continue to monitor.  A1c 6.8.  #Acid reflux: Continue Protonix. 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    Consultants:   Nephrology  Vascular surgery  Procedures: None Antimicrobials: None  Subjective: Seen and examined at bedside. No new event. Waiting for outpatient dialysis. Denied chest pain shortness of breath, dizziness or lightheadedness. Objective: Vitals:   09/10/16 0130 09/10/16 0205 09/10/16 0306 09/10/16 0938  BP: 140/72 126/71 118/74 104/70  Pulse: 93 94 100 91  Resp:  16 16 16   Temp:  98.8 F (37.1 C)  98.1 F (36.7 C)  TempSrc:  Oral  Oral  SpO2:  100% 100% 100%  Weight:  84.4 kg (186 lb 1.1 oz)    Height:        Intake/Output Summary (Last 24 hours) at 09/10/16 1128 Last data filed at 09/10/16 0940  Gross per 24 hour  Intake              720 ml  Output             2800 ml  Net            -2080 ml   [employer]  09/08/16 2122 09/09/16 2335 09/10/16 0205  Weight: 85 kg (187 lb 6.3 oz) 86.4 kg (190 lb 7.6 oz) 84.4 kg (186 lb 1.1 oz)    Examination:  General exam: Lying in bed comfortable, not in distress Respiratory system: Clear bilateral. Respiratory effort normal. No wheezing or crackle Cardiovascular system: Regular rate rhythm, S1 and S2 normal. No pedal edema    Gastrointestinal system: Abdomen soft, nontender, nondistended. Bowel sound positive. Central nervous system: Alert and oriented. No focal neurological deficits. Extremities: Symmetric 5 x 5 power. AV fistula site has no bleeding or discharge Skin: No rashes, lesions or ulcers Tunneled catheter site clean   Data Reviewed: I have personally reviewed following labs and imaging studies  CBC:  Recent Labs Lab 09/06/16 0614 09/07/16 0408 09/07/16 1110 09/09/16 0503 09/10/16 0709  WBC 5.4 6.0 6.2 7.4 7.9  HGB 7.5* 7.9* 8.0* 7.6* 7.6*  HCT 23.3* 24.9* 25.1* 24.3* 24.3*  MCV 89.6 91.9 90.9 92.0 91.0  PLT 256 283 241 237 678   Basic Metabolic Panel:  Recent Labs Lab 09/05/16 0705 09/06/16 0614 09/07/16 0408 09/07/16 1110 09/09/16 0503 09/10/16 0709  NA 137 137 136 137 135 132*  K 4.2 3.8 4.8 4.6 4.3 4.2  CL 108 106 107 108 104 100*  CO2 23 24 22  19* 23 26  GLUCOSE 205* 65 126* 169* 141* 221*  BUN 43* 33* 36* 35* 34* 23*  CREATININE 7.09* 6.69* 7.07* 6.92* 6.81* 5.13*  CALCIUM 8.5* 8.4* 8.5* 8.4* 8.5* 8.4*  PHOS 3.9  --   --  3.9  --  3.3   GFR: Estimated Creatinine Clearance: 17.8 mL/min (A) (by C-G formula based on SCr of 5.13 mg/dL (H)). Liver Function Tests:  Recent Labs Lab 09/04/16 1243 09/05/16 0705 09/07/16 1110 09/07/16 1453 09/10/16 0709  AST 20  --   --   --   --   ALT 19  --   --  15  --   ALKPHOS 69  --   --   --   --   BILITOT 0.4  --   --   --   --   PROT 7.2  --   --   --   --   ALBUMIN 3.9 3.0* 2.9*  --  2.7*    Recent Labs Lab 09/04/16 1243  LIPASE 24   No results for input(s): AMMONIA in the last 168 hours. Coagulation Profile:  Recent Labs Lab 09/07/16 0408  INR 0.98   Cardiac Enzymes: No results for input(s): CKTOTAL, CKMB, CKMBINDEX, TROPONINI in the last 168 hours. BNP (last 3 results) No results for input(s): PROBNP in the last 8760 hours. HbA1C: No results for input(s): HGBA1C in the last 72 hours. CBG:  Recent  Labs Lab 09/09/16 1210 09/09/16 1653 09/09/16 2009 09/10/16 0412 09/10/16 0834  GLUCAP 143* 207* 189* 241* 124*   Lipid Profile: No results for input(s): CHOL, HDL, LDLCALC, TRIG, CHOLHDL, LDLDIRECT in the last 72 hours. Thyroid Function Tests: No results for input(s): TSH, T4TOTAL, FREET4, T3FREE, THYROIDAB in the last 72 hours. Anemia Panel: No results for input(s): VITAMINB12, FOLATE, FERRITIN, TIBC, IRON, RETICCTPCT in the last 72 hours. Sepsis Labs: No results for input(s): PROCALCITON, LATICACIDVEN in the last 168 hours.  Recent Results (from the past 240 hour(s))  Surgical pcr screen     Status: None   Collection Time: 09/06/16 11:28 PM  Result Value Ref Range Status   MRSA, PCR NEGATIVE NEGATIVE Final   Staphylococcus aureus NEGATIVE NEGATIVE Final  Comment:        The Xpert SA Assay (FDA approved for NASAL specimens in patients over 8 years of age), is one component of a comprehensive surveillance program.  Test performance has been validated by [facility] for patients greater than or equal to 30 year old. It is not intended to diagnose infection nor to guide or monitor treatment.          Radiology Studies: No results found.      Scheduled Meds: . bisacodyl  10 mg Rectal Daily  . carbamazepine  200 mg Oral QPM  . darbepoetin (ARANESP) injection - NON-DIALYSIS  100 mcg Subcutaneous Q Tue-1800  . docusate sodium  200 mg Oral BID  . gabapentin  300 mg Oral QHS  . heparin  5,000 Units Subcutaneous Q8H  . hydrALAZINE  50 mg Oral TID  . insulin aspart  0-9 Units Subcutaneous TID WC  . insulin aspart  3 Units Subcutaneous TID WC  . insulin glargine  5 Units Subcutaneous q morning - 10a  . labetalol  400 mg Oral BID  . ondansetron (ZOFRAN) IV  4 mg Intravenous Q8H  . pantoprazole  40 mg Oral Daily  . polyethylene glycol  17 g Oral Daily  . sevelamer carbonate  800 mg Oral TID WC   Continuous Infusions: . sodium chloride    . sodium chloride        LOS: 6 days    Carrie [name] Tanna Furry, MD [facility] Pager [phone]  If 7PM-7AM, please contact night-coverage [email or web address] Password [facility] 09/10/2016, 11:28 AM

## 2016-09-11 LAB — GLUCOSE, CAPILLARY
GLUCOSE-CAPILLARY: 192 mg/dL — AB (ref 65–99)
GLUCOSE-CAPILLARY: 176 mg/dL — AB (ref 65–99)
GLUCOSE-CAPILLARY: 193 mg/dL — AB (ref 65–99)
Glucose-Capillary: 144 mg/dL — ABNORMAL HIGH (ref 65–99)
Glucose-Capillary: 163 mg/dL — ABNORMAL HIGH (ref 65–99)
Glucose-Capillary: 237 mg/dL — ABNORMAL HIGH (ref 65–99)

## 2016-09-11 MED ORDER — SENNOSIDES-DOCUSATE SODIUM 8.6-50 MG PO TABS
1.0000 | ORAL_TABLET | Freq: Two times a day (BID) | ORAL | Status: DC
  Administered 2016-09-11 (×2): 1 via ORAL
  Filled 2016-09-11 (×3): qty 1

## 2016-09-11 MED ORDER — PNEUMOCOCCAL VAC POLYVALENT 25 MCG/0.5ML IJ INJ
0.5000 mL | INJECTION | INTRAMUSCULAR | Status: AC
  Administered 2016-09-12: 0.5 mL via INTRAMUSCULAR
  Filled 2016-09-11 (×2): qty 0.5

## 2016-09-11 NOTE — Progress Notes (Signed)
Patient ID: Carrie [name], female   DOB: [birth year], 36 y.o.   MRN: [id number]  [facility] Progress Note   Assessment/ Plan:   1. Diabetic gastroparesis with chronic nausea/vomiting: Currently appears to be controlled with initiation of dialysis indicating that there was possibly superimposed uremic gastropathy. 2. ESRD: Progressive chronic kidney disease from underlying diabetes and hypertension-now a new start hemodialysis with next treatment to be undertaken tomorrow. Continue TTS schedule -supposedly accepted to his [facility] on a M/W/F schedule. 3. Anemia: Iron saturation 25%-will give intravenous iron with next dialysis. Continue ESA. 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 5. Nutrition: Continue renal diet, begin renal vitamin. 6. Hypertension: Blood pressures appear to be under fair control, monitor with ultrafiltration at hemodialysis  Subjective:   Reports to be feeling fair-denies any specific complaints    Objective:   BP 136/67 (BP Location: Right Arm)   Pulse 90   Temp 98.5 F (36.9 C) (Oral)   Resp 18   Ht 5\' 8"  (1.727 m)   Wt 84.2 kg (185 lb 10 oz)   LMP 07/16/2016 (Approximate)   SpO2 100%   BMI 28.22 kg/m   Physical Exam: ATF:[id number] resting in bed CVS: Pulse regular rhythm, normal rate Resp: Clear to auscultation bilaterally, no rales Abd: Soft, obese, nontender Ext: Trace to 1+ lower extremity edema  Labs: BMET  Recent Labs Lab 09/04/16 1243 09/04/16 2106 09/05/16 0705 09/06/16 0614 09/07/16 0408 09/07/16 1110 09/09/16 0503 09/10/16 0709  NA 135  --  137 137 136 137 135 132*  K 4.7  --  4.2 3.8 4.8 4.6 4.3 4.2  CL 101  --  108 106 107 108 104 100*  CO2 23  --  23 24 22  19* 23 26  GLUCOSE 302*  --  205* 65 126* 169* 141* 221*  BUN 48*  --  43* 33* 36* 35* 34* 23*  CREATININE 6.92* 6.75* 7.09* 6.69* 7.07* 6.92* 6.81* 5.13*  CALCIUM 9.2  --  8.5*  8.4* 8.5* 8.4* 8.5* 8.4*  PHOS  --   --  3.9  --   --  3.9  --  3.3   CBC  Recent Labs Lab 09/07/16 0408 09/07/16 1110 09/09/16 0503 09/10/16 0709  WBC 6.0 6.2 7.4 7.9  HGB 7.9* 8.0* 7.6* 7.6*  HCT 24.9* 25.1* 24.3* 24.3*  MCV 91.9 90.9 92.0 91.0  PLT 283 241 237 214   Medications:    . bisacodyl  10 mg Rectal Daily  . carbamazepine  200 mg Oral QPM  . darbepoetin (ARANESP) injection - NON-DIALYSIS  100 mcg Subcutaneous Q Tue-1800  . docusate sodium  200 mg Oral BID  . gabapentin  300 mg Oral QHS  . heparin  5,000 Units Subcutaneous Q8H  . hydrALAZINE  50 mg Oral TID  . insulin aspart  0-9 Units Subcutaneous TID WC  . insulin aspart  3 Units Subcutaneous TID WC  . insulin glargine  5 Units Subcutaneous q morning - 10a  . labetalol  400 mg Oral BID  . ondansetron (ZOFRAN) IV  4 mg Intravenous Q8H  . pantoprazole  40 mg Oral Daily  . polyethylene glycol  17 g Oral Daily  . senna-docusate  1 tablet Oral BID  . sevelamer carbonate  800 mg Oral TID WC   Elmarie Shiley, MD 09/11/2016, 10:31 AM

## 2016-09-11 NOTE — Clinical Social Work Note (Signed)
CSW received consult from nursing to talk with patient regarding a disability application. Patient currently works full-time, lives with her father and contributes financially to the household. Ms. Carrie [name] is not sure if she will be able to continue working and SSA disability was discussed. Also talked with patient regarding her job, her new ESRD diagnosis, dialysis schedule, and transportation to dialysis (patient lives in [place]). CSW listened empathically and provided support and information. Patient advised to ask for SW again if needed prior to discharge.   Conswella Bruney Givens, MSW, LCSW Licensed Clinical Social Worker [place] [phone]

## 2016-09-11 NOTE — Progress Notes (Signed)
Patient states that senokot helps with her constipation. MD notified. Verbal order to give senokot one tablet bid. Orders placed and followed. Will continue to monitor.

## 2016-09-11 NOTE — Progress Notes (Addendum)
PROGRESS NOTE    Carrie [name]  IRJ:[id number] DOB: 06/07/1980 DOA: 09/04/2016 PCP: Annetta Maw, MD   Brief Narrative: 36 y.o.femalewith medical history significant of stage V chronic kidney disease, type 1 diabetes, trigeminal neuralgia, peripheral neuropathy who presented to the hospital (third visit this month) for evaluation of the nausea vomiting and abdominal pain. Per patient, she has a known history of gastroparesis-and was maintained on Reglan for approximately 1 year. Approximately 2 months back, Reglan was discontinued by her primary nephrologist and primary care practitioner due to concerns of early tardive dyskinesia. She then started having intermittent nausea and vomiting, that has gradually worsened, workup in the ER showed CT scan with moderate stool burden but no acute findings, creatinine close to 7, she was admitted for persistent nausea, vomiting, now likely ESRD with possible dialysis need.  Assessment & Plan:  #  Chronic kidney disease is stage V now progressed to ESRD: -Status post tunneled catheter placement and left upper extremity AV fistula creation by vascular surgery on 5/24. Initiated hemodialysis treatment by nephrologist.  -Outpatient hemodialysis center set of ongoing. -Continue to monitor BMP, blood pressure and electrolytes.  -Both vascular and nephrology consult appreciated. Discussed with Dr. Posey Pronto from nephrology today. Next HD tomorrow.  #Diabetic gastroparesis with chronic nausea and vomiting: Unable to tolerate Reglan due to side effect of tardive dyskinesia. CT scan of abdomen and pelvis with stone burden.  -Clinically stable. Continue diet and supportive care. Tolerating diet  #Hypertension:   Discontinued Norvasc and hydralazine. Blood pressure improving after initiation of hemodialysis. Continue labetalol for now.   #Anemia of chronic kidney disease: Hemoglobin is stable. Monitor CBC. on Aranesp treatment during dialysis.   #History of  trigeminal neuralgia: Continue Tegretol  #Type 1 diabetes with Diabetic peripheral neuropathy: Blood sugar level better controlled. Currently on lower dose of Lantus. Continue to monitor.  A1c 6.8.  #Acid reflux: Continue Protonix. 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    Consultants:   Nephrology  Vascular surgery  Procedures: None Antimicrobials: None  Subjective: Seen and examined at bedside. No new event. Tolerating diet well. Denied chest pain or shortness of breath.  Objective: Vitals:   09/10/16 1641 09/10/16 2001 09/11/16 0426 09/11/16 0745  BP: 128/66 127/67 131/70 136/67  Pulse: 93 93 87 90  Resp: 18 18 18 18   Temp: 99 F (37.2 C) 99.2 F (37.3 C) 98.4 F (36.9 C) 98.5 F (36.9 C)  TempSrc: Oral Oral Oral Oral  SpO2: 100% 100% 100% 100%  Weight:  84.2 kg (185 lb 10 oz)    Height:        Intake/Output Summary (Last 24 hours) at 09/11/16 1445 Last data filed at 09/11/16 0830  Gross per 24 hour  Intake              360 ml  Output              600 ml  Net             -240 ml   [employer] We[employer]   09/09/16 2335 09/10/16 0205 09/10/16 2001  Weight: 86.4  kg (190 lb 7.6 oz) 84.4 kg (186 lb 1.1 oz) 84.2 kg (185 lb 10 oz)    Examination:  General exam: Lying on bed comfortable, not in distress Respiratory system: Clear bilateral Respiratory effort normal. No wheezing or crackle Cardiovascular system: Regular rate rhythm, S1 and S2 normal. No pedal edema Gastrointestinal system: Abdomen soft, nontender, nondistended. Bowel sound positive. Central nervous system:  Alert and oriented. No focal neurological deficits. Extremities: Symmetric 5 x 5 power. AV fistula site has no bleeding or discharge Skin: No rashes, lesions or ulcers Tunneled catheter site clean   Data Reviewed: I have personally reviewed following labs and imaging studies  CBC:  Recent Labs Lab 09/06/16 0614 09/07/16 0408 09/07/16 1110 09/09/16 0503 09/10/16 0709  WBC 5.4 6.0 6.2 7.4 7.9  HGB 7.5* 7.9* 8.0* 7.6* 7.6*  HCT 23.3* 24.9* 25.1* 24.3* 24.3*  MCV 89.6 91.9 90.9 92.0 91.0  PLT 256 283 241 237 938   Basic Metabolic Panel:  Recent Labs Lab 09/05/16 0705 09/06/16 0614 09/07/16 0408 09/07/16 1110 09/09/16 0503 09/10/16 0709  NA 137 137 136 137 135 132*  K 4.2 3.8 4.8 4.6 4.3 4.2  CL 108 106 107 108 104 100*  CO2 23 24 22  19* 23 26  GLUCOSE 205* 65 126* 169* 141* 221*  BUN 43* 33* 36* 35* 34* 23*  CREATININE 7.09* 6.69* 7.07* 6.92* 6.81* 5.13*  CALCIUM 8.5* 8.4* 8.5* 8.4* 8.5* 8.4*  PHOS 3.9  --   --  3.9  --  3.3   GFR: Estimated Creatinine Clearance: 17.7 mL/min (A) (by C-G formula based on SCr of 5.13 mg/dL (H)). Liver Function Tests:  Recent Labs Lab 09/05/16 0705 09/07/16 1110 09/07/16 1453 09/10/16 0709  ALT  --   --  15  --   ALBUMIN 3.0* 2.9*  --  2.7*   No results for input(s): LIPASE, AMYLASE in the last 168 hours. No results for input(s): AMMONIA in the last 168 hours. Coagulation Profile:  Recent Labs Lab 09/07/16 0408  INR 0.98   Cardiac Enzymes: No results for input(s): CKTOTAL, CKMB, CKMBINDEX, TROPONINI in the last 168 hours. BNP (last 3 results) No results for input(s): PROBNP in the last 8760 hours. HbA1C: No results for input(s): HGBA1C in the last 72 hours. CBG:  Recent Labs Lab 09/10/16 1959 09/10/16 2356 09/11/16 0424 09/11/16 0809 09/11/16 1225  GLUCAP 176* 163* 192* 176* 144*   Lipid Profile: No results for input(s): CHOL, HDL, LDLCALC, TRIG, CHOLHDL, LDLDIRECT in the last 72 hours. Thyroid Function  Tests: No results for input(s): TSH, T4TOTAL, FREET4, T3FREE, THYROIDAB in the last 72 hours. Anemia Panel: No results for input(s): VITAMINB12, FOLATE, FERRITIN, TIBC, IRON, RETICCTPCT in the last 72 hours. Sepsis Labs: No results for input(s): PROCALCITON, LATICACIDVEN in the last 168 hours.  Recent Results (from the past 240 hour(s))  Surgical pcr screen     Status: None   Collection Time: 09/06/16 11:28 PM  Result Value Ref Range Status   MRSA, PCR NEGATIVE NEGATIVE Final   Staphylococcus aureus NEGATIVE NEGATIVE Final    Comment:        The Xpert SA Assay (FDA approved for NASAL specimens in patients over 93 years of age), is one component of a comprehensive surveillance program.  Test performance has been validated by [facility] for patients greater than or equal to 42 year old. It is not intended to diagnose infection nor to guide or monitor treatment.  Radiology Studies: No results found.      Scheduled Meds: . bisacodyl  10 mg Rectal Daily  . carbamazepine  200 mg Oral QPM  . darbepoetin (ARANESP) injection - NON-DIALYSIS  100 mcg Subcutaneous Q Tue-1800  . docusate sodium  200 mg Oral BID  . gabapentin  300 mg Oral QHS  . heparin  5,000 Units Subcutaneous Q8H  . hydrALAZINE  50 mg Oral TID  . insulin aspart  0-9 Units Subcutaneous TID WC  . insulin aspart  3 Units Subcutaneous TID WC  . insulin glargine  5 Units Subcutaneous q morning - 10a  . labetalol  400 mg Oral BID  . ondansetron (ZOFRAN) IV  4 mg Intravenous Q8H  . pantoprazole  40 mg Oral Daily  . polyethylene glycol  17 g Oral Daily  . senna-docusate  1 tablet Oral BID  . sevelamer carbonate  800 mg Oral TID WC   Continuous Infusions: . sodium chloride    . sodium chloride       LOS: 7 days    Taj Nevins Tanna Furry, MD [facility] Pager [phone]  If 7PM-7AM, please contact night-coverage [email or web address] Password [email or web address] 09/11/2016, 2:45 PM

## 2016-09-11 NOTE — Progress Notes (Signed)
Patients B/P is 111/55 and HR is 94. MD notified. MD stated to hold hydralazine. Orders followed. Will continue to monitor.

## 2016-09-12 LAB — CBC
HEMATOCRIT: 24.2 % — AB (ref 36.0–46.0)
HEMOGLOBIN: 7.9 g/dL — AB (ref 12.0–15.0)
MCH: 29.8 pg (ref 26.0–34.0)
MCHC: 32.6 g/dL (ref 30.0–36.0)
MCV: 91.3 fL (ref 78.0–100.0)
Platelets: 247 10*3/uL (ref 150–400)
RBC: 2.65 MIL/uL — ABNORMAL LOW (ref 3.87–5.11)
RDW: 14.4 % (ref 11.5–15.5)
WBC: 6.7 10*3/uL (ref 4.0–10.5)

## 2016-09-12 LAB — GLUCOSE, CAPILLARY
Glucose-Capillary: 174 mg/dL — ABNORMAL HIGH (ref 65–99)
Glucose-Capillary: 188 mg/dL — ABNORMAL HIGH (ref 65–99)
Glucose-Capillary: 190 mg/dL — ABNORMAL HIGH (ref 65–99)
Glucose-Capillary: 224 mg/dL — ABNORMAL HIGH (ref 65–99)

## 2016-09-12 MED ORDER — DARBEPOETIN ALFA 100 MCG/0.5ML IJ SOSY: 100.0000 ug | PREFILLED_SYRINGE | INTRAMUSCULAR | Status: DC

## 2016-09-12 MED ORDER — GABAPENTIN 300 MG PO CAPS: 300.0000 mg | ORAL_CAPSULE | Freq: Every day | ORAL | Status: DC

## 2016-09-12 MED ORDER — PROMETHAZINE HCL 25 MG PO TABS: 25.0000 mg | ORAL_TABLET | Freq: Three times a day (TID) | ORAL | 0 refills | Status: DC | PRN

## 2016-09-12 MED ORDER — INSULIN GLARGINE 100 UNIT/ML SOLOSTAR PEN
10.0000 [IU] | PEN_INJECTOR | SUBCUTANEOUS | 99 refills | Status: DC
Start: 2016-09-12 — End: 2016-12-12

## 2016-09-12 MED ORDER — ONDANSETRON HCL 4 MG PO TABS: 4.0000 mg | ORAL_TABLET | Freq: Three times a day (TID) | ORAL | 0 refills | Status: DC | PRN

## 2016-09-12 MED ORDER — POLYETHYLENE GLYCOL 3350 17 G PO PACK: 17.0000 g | PACK | Freq: Every day | ORAL | 0 refills | Status: DC

## 2016-09-12 NOTE — Progress Notes (Signed)
Carrie [name] to be D/C'd Home per MD order.  Discussed prescriptions and follow up appointments with the patient. Prescriptions given to patient, medication list explained in detail. Pt verbalized understanding.  Allergies as of 09/12/2016      Reactions   No Known Allergies       Medication List    STOP taking these medications   ciprofloxacin 500 MG tablet Commonly known as:  CIPRO   hydrALAZINE 50 MG tablet Commonly known as:  APRESOLINE   metoCLOPramide 10 MG tablet Commonly known as:  REGLAN   metroNIDAZOLE 500 MG tablet Commonly known as:  FLAGYL   torsemide 20 MG tablet Commonly known as:  DEMADEX     TAKE these medications   acetaminophen 325 MG tablet Commonly known as:  TYLENOL Take 650 mg by mouth every 6 (six) hours as needed for mild pain.   carbamazepine 200 MG tablet Commonly known as:  TEGRETOL Take 200 mg by mouth every evening.   gabapentin 300 MG capsule Commonly known as:  NEURONTIN Take 1 capsule (300 mg total) by mouth at bedtime. What changed:  when to take this   Insulin Glargine 100 UNIT/ML Solostar Pen Commonly known as:  LANTUS SOLOSTAR Inject 10 Units into the skin every morning. And pen needles 1/day What changed:  how much to take   labetalol 300 MG tablet Commonly known as:  NORMODYNE Take 300 mg by mouth 3 (three) times daily.   lactulose 10 GM/15ML solution Commonly known as:  CHRONULAC Take 10 g by mouth 3 (three) times daily.   ondansetron 4 MG tablet Commonly known as:  ZOFRAN Take 1 tablet (4 mg total) by mouth every 8 (eight) hours as needed for nausea or vomiting.   polyethylene glycol packet Commonly known as:  MIRALAX / GLYCOLAX Take 17 g by mouth daily. Start taking on:  09/13/2016   promethazine 25 MG tablet Commonly known as:  PHENERGAN Take 1 tablet (25 mg total) by mouth every 8 (eight) hours as needed for nausea or vomiting. What changed:  when to take this   sevelamer carbonate 800 MG tablet Commonly  known as:  RENVELA Take 800 mg by mouth 3 (three) times daily with meals.       Vitals:   09/12/16 0925 09/12/16 1436  BP: (!) 150/76 (!) 153/82  Pulse: 84 89  Resp: 16   Temp: 98.4 F (36.9 C)     Skin clean, dry and intact without evidence of skin break down, no evidence of skin tears noted. IV catheter discontinued intact. Site without signs and symptoms of complications. Dressing and pressure applied. Pt denies pain at this time. No complaints noted.  An After Visit Summary was printed and given to the patient. Patient escorted via [place], and D/C home via private auto.  Emilio Math, RN [facility] Phone [phone]

## 2016-09-12 NOTE — Progress Notes (Signed)
[facility] at [facility] Schedule and time  [place].Wed,Fri 2nd shift start date 5/ 30/18need to be there at 1130am to sign paperwork chairtime 1230pm

## 2016-09-12 NOTE — Progress Notes (Signed)
Patient ID: Carrie [name], female   DOB: 1980-07-28, 36 y.o.   MRN: [id number]  [facility] Progress Note   Assessment/ Plan:   1. Diabetic gastroparesis with chronic nausea/vomiting: Symptoms appear to have improved following dialysis-still having intermittent nausea but comparatively better. 2. ESRD: Plan for hemodialysis today with likely discharge home thereafter to resume outpatient hemodialysis on a Monday/Wednesday/Friday schedule at [facility].  3. Anemia: Started on intravenous iron supplementation with hemodialysis. Continue ESA. 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  5. Nutrition: Continue renal diet, on renal vitamin. 6. Hypertension: Blood pressures appear to be under fair control, monitor with ultrafiltration at hemodialysis  Subjective:   Reports an uneventful night, denies any chest pain or shortness of breath    Objective:   BP (!) 150/76 (BP Location: Right Arm) Comment: Natalie, RN notified  Pulse 84   Temp 98.4 F (36.9 C) (Oral)   Resp 16   Ht 5\' 8"  (1.727 m)   Wt 84.1 kg (185 lb 6.5 oz)   LMP 07/16/2016 (Approximate)   SpO2 100%   BMI 28.19 kg/m   Physical Exam: JXB:[id number] resting in bed CVS: Pulse regular rhythm, normal rate Resp: Clear to auscultation bilaterally, no rales Abd: Soft, obese, nontender Ext: Trace to 1+ lower extremity edema  Labs: BMET  Recent Labs Lab 09/06/16 0614 09/07/16 0408 09/07/16 1110 09/09/16 0503 09/10/16 0709  NA 137 136 137 135 132*  K 3.8 4.8 4.6 4.3 4.2  CL 106 107 108 104 100*  CO2 24 22 19* 23 26  GLUCOSE 65 126* 169* 141* 221*  BUN 33* 36* 35* 34* 23*  CREATININE 6.69* 7.07* 6.92* 6.81* 5.13*  CALCIUM 8.4* 8.5* 8.4* 8.5* 8.4*  PHOS  --   --  3.9  --  3.3   CBC  Recent Labs Lab 09/07/16 1110 09/09/16 0503 09/10/16 0709 09/12/16 0838  WBC 6.2 7.4 7.9 6.7  HGB 8.0* 7.6* 7.6* 7.9*  HCT 25.1* 24.3* 24.3* 24.2*  MCV 90.9  92.0 91.0 91.3  PLT 241 237 214 247   Medications:    . bisacodyl  10 mg Rectal Daily  . carbamazepine  200 mg Oral QPM  . darbepoetin (ARANESP) injection - DIALYSIS  100 mcg Intravenous Q Tue-HD  . docusate sodium  200 mg Oral BID  . gabapentin  300 mg Oral QHS  . heparin  5,000 Units Subcutaneous Q8H  . insulin aspart  0-9 Units Subcutaneous TID WC  . insulin aspart  3 Units Subcutaneous TID WC  . insulin glargine  5 Units Subcutaneous q morning - 10a  . labetalol  400 mg Oral BID  . ondansetron (ZOFRAN) IV  4 mg Intravenous Q8H  . pantoprazole  40 mg Oral Daily  . pneumococcal 23 valent vaccine  0.5 mL Intramuscular Tomorrow-1000  . polyethylene glycol  17 g Oral Daily  . senna-docusate  1 tablet Oral BID  . sevelamer carbonate  800 mg Oral TID WC   Elmarie Shiley, MD 09/12/2016, 9:34 AM

## 2016-09-12 NOTE — Discharge Summary (Signed)
Physician Discharge Summary  Carrie [name] BMW:[id number] DOB: March 16, 1981 DOA: 09/04/2016  PCP: Carrie [name]  Admit date: 09/04/2016 Discharge date: 09/12/2016  Admitted From: Home Disposition:  Home  Recommendations for Outpatient Follow-up:  1. Follow up with PCP in 1-2 weeks 2. Please obtain BMP/CBC in one week 3.   Please go to [facility]  Schedule and time:  [facility] 2nd shift start date 5/ 30/18, need to be there at 1130am to sign paperwork chairtime 1230pm.  Home Health:no Equipment/Devices: None Discharge Condition: Stable  CODE STATUS: Full code  Diet recommendation:Carb modified heart healthy diet  Brief/Interim Summary: 36 y.o.femalewith medical history significant of stage V chronic kidney disease, type 1 diabetes, trigeminal neuralgia, peripheral neuropathy who presented to the hospital (third visit this month) for evaluation of the nausea vomiting and abdominal pain. Per patient, she has a known history of gastroparesis-and was maintained on Reglan for approximately 1 year. Approximately 2 months back, Reglan was discontinued by her primary nephrologist and primary care practitioner due to concerns of early tardive dyskinesia. She then started having intermittent nausea and vomiting, that has gradually worsened, workup in the ER showed CT scan with moderate stool burden but no acute findings, creatinine close to 7, she was admitted for persistent nausea, vomiting, now likely ESRD with possible dialysis need.  Chronic kidney disease stage V progressed to ESRD with initiation of hemodialysis via a tunneled catheter. Placement of left upper extremity AV fistula by vascular surgery. Hemodialysis tolerating well. Outpatient dialysis was set up already. Recommended to follow-up at outpatient dialysis center, with PCP and vascular surgeon.  Patient has diabetic gastroparesis with chronic nausea and vomiting: Tolerating well.  Discharged with Zofran and Phenergan as needed. CT scan of abdomen and pelvis no acute finding. Tolerating diet well.  Medication adjusted for the management of hypertension. Discontinue Norvasc and hydralazine. Advised to monitor blood pressure at outpatient follow-up.  Continue to monitor hemoglobin for anemia of chronic kidney disease, continue home medication. Recommended to monitor blood sugar level for management of diabetes on chronic insulin.  Patient is clinically stable. Discharging home with outpatient follow-up with PCP, nephrologist at dialysis center.  Discharge Diagnoses:  Active Problems:   Uncontrolled type 1 diabetes mellitus (HCC)   Trigeminal neuralgia   Intractable vomiting   CKD stage 5 due to type 1 diabetes mellitus (HCC)   Gastroparesis   ESRD on hemodialysis [facility])    Discharge Instructions  Discharge Instructions    Call [name] for:  difficulty breathing, headache or visual disturbances    Complete by:  As directed    Call [name] for:  extreme fatigue    Complete by:  As directed    Call [name] for:  hives    Complete by:  As directed    Call [name] for:  persistant dizziness or light-headedness    Complete by:  As directed    Call [name] for:  persistant nausea and vomiting    Complete by:  As directed    Call [name] for:  redness, tenderness, or signs of infection (pain, swelling, redness, odor or green/yellow discharge around incision site)    Complete by:  As directed    Call [name] for:  severe uncontrolled pain    Complete by:  As directed    Call [name] for:  temperature >100.4    Complete by:  As directed    Diet - low sodium heart healthy    Complete by:  As  directed    Diet Carb Modified    Complete by:  As directed    Discharge instructions    Complete by:  As directed    Go to [facility] [place] Schedule and time  [place].Wed,Fri 2nd shift start date 5/ 30/18need to be there at 1130am to sign paperwork chairtime 1230pm   Increase activity  slowly    Complete by:  As directed      Allergies as of 09/12/2016      Reactions   No Known Allergies       Medication List    STOP taking these medications   ciprofloxacin 500 MG tablet Commonly known as:  CIPRO   hydrALAZINE 50 MG tablet Commonly known as:  APRESOLINE   metoCLOPramide 10 MG tablet Commonly known as:  REGLAN   metroNIDAZOLE 500 MG tablet Commonly known as:  FLAGYL   torsemide 20 MG tablet Commonly known as:  DEMADEX     TAKE these medications   acetaminophen 325 MG tablet Commonly known as:  TYLENOL Take 650 mg by mouth every 6 (six) hours as needed for mild pain.   carbamazepine 200 MG tablet Commonly known as:  TEGRETOL Take 200 mg by mouth every evening.   gabapentin 300 MG capsule Commonly known as:  NEURONTIN Take 1 capsule (300 mg total) by mouth at bedtime. What changed:  when to take this   Insulin Glargine 100 UNIT/ML Solostar Pen Commonly known as:  LANTUS SOLOSTAR Inject 10 Units into the skin every morning. And pen needles 1/day What changed:  how much to take   labetalol 300 MG tablet Commonly known as:  NORMODYNE Take 300 mg by mouth 3 (three) times daily.   lactulose 10 GM/15ML solution Commonly known as:  CHRONULAC Take 10 g by mouth 3 (three) times daily.   ondansetron 4 MG tablet Commonly known as:  ZOFRAN Take 1 tablet (4 mg total) by mouth every 8 (eight) hours as needed for nausea or vomiting.   polyethylene glycol packet Commonly known as:  MIRALAX / GLYCOLAX Take 17 g by mouth daily. Start taking on:  09/13/2016   promethazine 25 MG tablet Commonly known as:  PHENERGAN Take 1 tablet (25 mg total) by mouth every 8 (eight) hours as needed for nausea or vomiting. What changed:  when to take this   sevelamer carbonate 800 MG tablet Commonly known as:  RENVELA Take 800 mg by mouth 3 (three) times daily with meals.      Follow-up Information    Carrie [name] Follow up in 6 week(s).   Specialties:   Vascular Surgery, Cardiology Why:  Office will call you to arrange your appt (sent) Contact information: [place] [phone]        Carrie [name]. Schedule an appointment as soon as possible for a visit in 1 week(s).   Specialty:  Internal Medicine Contact information: [place] [phone]          Allergies  Allergen Reactions  . No Known Allergies     Consultations: Nephrology Vascular surgery  Procedures/Studies: Permanent dialysis catheter is patent, AV fistula creation  Subjective: Seen and examined at bedside. Denied nausea vomiting, chest pain, shortness of breath or abdominal pain. Tolerating diet well. Tolerating hemodialysis well. Verbalized understanding of follow-up instructions.  Discharge Exam: Vitals:   09/12/16 0400 09/12/16 0925  BP: 126/65 (!) 150/76  Pulse: 84 84  Resp:  16 16  Temp: 98.7 F (37.1 C) 98.4 F (36.9 C)   Vitals:   09/11/16 1750 09/11/16 2030 09/12/16 0400 09/12/16 0925  BP: 128/67 121/79 126/65 (!) 150/76  Pulse: 88 92 84 84  Resp:  16 16 16   Temp: 98.4 F (36.9 C) 98.5 F (36.9 C) 98.7 F (37.1 C) 98.4 F (36.9 C)  TempSrc: Oral Oral Oral Oral  SpO2:  100% 99% 100%  Weight:  84.1 kg (185 lb 6.5 oz)    Height:        General: Pt is alert, awake, not in acute distress Cardiovascular: RRR, S1/S2 +, no rubs, no gallops Respiratory: CTA bilaterally, no wheezing, no rhonchi Abdominal: Soft, NT, ND, bowel sounds + Extremities: no edema, no cyanosis Tunneled catheter site clean with no discharge. Left upper extremity AV fistula site has good thrill and bruit. No sign of bleeding or discharge.   The results of significant diagnostics from this hospitalization (including imaging, microbiology, ancillary and laboratory) are listed below for reference.     Microbiology: Recent Results (from the past 240 hour(s))  Surgical pcr screen     Status: None    Collection Time: 09/06/16 11:28 PM  Result Value Ref Range Status   MRSA, PCR NEGATIVE NEGATIVE Final   Staphylococcus aureus NEGATIVE NEGATIVE Final    Comment:        The Xpert SA Assay (FDA approved for NASAL specimens in patients over 58 years of age), is one component of a comprehensive surveillance program.  Test performance has been validated by [facility] for patients greater than or equal to 70 year old. It is not intended to diagnose infection nor to guide or monitor treatment.      Labs: BNP (last 3 results) No results for input(s): BNP in the last 8760 hours. Basic Metabolic Panel:  Recent Labs Lab 09/06/16 0614 09/07/16 0408 09/07/16 1110 09/09/16 0503 09/10/16 0709  NA 137 136 137 135 132*  K 3.8 4.8 4.6 4.3 4.2  CL 106 107 108 104 100*  CO2 24 22 19* 23 26  GLUCOSE 65 126* 169* 141* 221*  BUN 33* 36* 35* 34* 23*  CREATININE 6.69* 7.07* 6.92* 6.81* 5.13*  CALCIUM 8.4* 8.5* 8.4* 8.5* 8.4*  PHOS  --   --  3.9  --  3.3   Liver Function Tests:  Recent Labs Lab 09/07/16 1110 09/07/16 1453 09/10/16 0709  ALT  --  15  --   ALBUMIN 2.9*  --  2.7*   No results for input(s): LIPASE, AMYLASE in the last 168 hours. No results for input(s): AMMONIA in the last 168 hours. CBC:  Recent Labs Lab 09/07/16 0408 09/07/16 1110 09/09/16 0503 09/10/16 0709 09/12/16 0838  WBC 6.0 6.2 7.4 7.9 6.7  HGB 7.9* 8.0* 7.6* 7.6* 7.9*  HCT 24.9* 25.1* 24.3* 24.3* 24.2*  MCV 91.9 90.9 92.0 91.0 91.3  PLT 283 241 237 214 247   Cardiac Enzymes: No results for input(s): CKTOTAL, CKMB, CKMBINDEX, TROPONINI in the last 168 hours. BNP: Invalid input(s): POCBNP CBG:  Recent Labs Lab 09/11/16 2022 09/12/16 0010 09/12/16 0401 09/12/16 0810 09/12/16 1145  GLUCAP 237* 224* 190* 188* 174*   D-Dimer No results for input(s): DDIMER in the last 72 hours. Hgb A1c No results for input(s): HGBA1C in the last 72 hours. Lipid Profile No results for input(s): CHOL,  HDL, LDLCALC, TRIG, CHOLHDL, LDLDIRECT in the last 72 hours. Thyroid function studies No results for input(s): TSH, T4TOTAL, T3FREE, THYROIDAB in  the last 72 hours.  Invalid input(s): FREET3 Anemia work up No results for input(s): VITAMINB12, FOLATE, FERRITIN, TIBC, IRON, RETICCTPCT in the last 72 hours. Urinalysis    Component Value Date/Time   COLORURINE YELLOW 09/04/2016 1301   APPEARANCEUR HAZY (A) 09/04/2016 1301   LABSPEC 1.014 09/04/2016 1301   PHURINE 6.0 09/04/2016 1301   GLUCOSEU >=500 (A) 09/04/2016 1301   HGBUR NEGATIVE 09/04/2016 1301   HGBUR large 05/25/2009 0900   BILIRUBINUR NEGATIVE 09/04/2016 1301   BILIRUBINUR neg 10/23/2012 1810   KETONESUR NEGATIVE 09/04/2016 1301   PROTEINUR >=300 (A) 09/04/2016 1301   UROBILINOGEN 0.2 04/16/2013 2155   NITRITE NEGATIVE 09/04/2016 1301   LEUKOCYTESUR NEGATIVE 09/04/2016 1301   Sepsis Labs Invalid input(s): PROCALCITONIN,  WBC,  LACTICIDVEN Microbiology Recent Results (from the past 240 hour(s))  Surgical pcr screen     Status: None   Collection Time: 09/06/16 11:28 PM  Result Value Ref Range Status   MRSA, PCR NEGATIVE NEGATIVE Final   Staphylococcus aureus NEGATIVE NEGATIVE Final    Comment:        The Xpert SA Assay (FDA approved for NASAL specimens in patients over 63 years of age), is one component of a comprehensive surveillance program.  Test performance has been validated by [facility] for patients greater than or equal to 75 year old. It is not intended to diagnose infection nor to guide or monitor treatment.      Time coordinating discharge: 33 minutes  SIGNED:   Rosita Fire, [name]  [facility] 09/12/2016, 12:49 PM  If 7PM-7AM, please contact night-coverage [email or web address] Password [email or web address]

## 2016-09-14 ENCOUNTER — Ambulatory Visit (INDEPENDENT_AMBULATORY_CARE_PROVIDER_SITE_OTHER): Payer: BLUE CROSS/BLUE SHIELD | Admitting: Endocrinology

## 2016-09-14 ENCOUNTER — Encounter: Payer: Self-pay | Admitting: Endocrinology

## 2016-09-14 DIAGNOSIS — Z794 Long term (current) use of insulin: Secondary | ICD-10-CM

## 2016-09-14 DIAGNOSIS — N186 End stage renal disease: Secondary | ICD-10-CM

## 2016-09-14 DIAGNOSIS — E1122 Type 2 diabetes mellitus with diabetic chronic kidney disease: Secondary | ICD-10-CM | POA: Diagnosis not present

## 2016-09-14 DIAGNOSIS — Z992 Dependence on renal dialysis: Secondary | ICD-10-CM | POA: Diagnosis not present

## 2016-09-14 NOTE — Progress Notes (Signed)
Subjective:    Patient ID: Carrie [name], female    DOB: [birth year], 36 y.o.   MRN: [id number]  HPI Pt returns for f/u of diabetes mellitus: DM type: Insulin-requiring type 2 Dx'ed: 0076 Complications: polyneuropathy, renal insufficiency, gastroparesis, and retinopathy Therapy: insulin since 2003 pregnancy GDM: never DKA: never Severe hypoglycemia: last episode was early 2018 Pancreatitis: never.  Pancreatic imaging:  Other: due to noncompliance, she is not a candidate for multiple daily injections Interval history: She got out of the hospital 2 days ago.  She takes lantus, 10 units qd.  no cbg record, but states cbg's vary from 40-400.  She says prior to hosp adm, cbg's were persistently low on 30 units qd.   She started dialysis yesterday.  Nausea persists.   Past Medical History:  Diagnosis Date  . Diabetes mellitus without complication ([place])   . Diabetic neuropathy ([place])   . Gastroparesis   . HA (headache)   . Hypertension   . Neuropathy   . Renal disorder   . Trigeminal neuralgia     Past Surgical History:  Procedure Laterality Date  . AV FISTULA PLACEMENT Left 09/07/2016   Procedure: Left arm 1st Stage Basilic AV Fistula;  Surgeon: [name], MD;  Location: [facility] OR;  Service: Vascular;  Laterality: Left;  . EYE SURGERY    . INSERTION OF DIALYSIS CATHETER Right 09/07/2016   Procedure: INSERTION OF DIALYSIS CATHETER;  Surgeon: [name], MD;  Location: [facility];  Service: Vascular;  Laterality: Right;  . None      Social History   Social History  . Marital status: Single    Spouse name: N/A  . Number of children: 1  . Years of education: 72   Occupational History  . [employer]   Social History Main Topics  . Smoking status: Never Smoker  . Smokeless tobacco: Never Used  . Alcohol use No     Comment: former use  . Drug use: No  . Sexual activity: Yes   Other Topics Concern  . Not on file   Social History Narrative   Patient lives at home with her father and daughter. Patient works full time at [employer] .   Education some college.   Right handed   Caffeine Five sodas daily.             Current Outpatient Prescriptions on File Prior to Visit  Medication Sig Dispense Refill  . acetaminophen (TYLENOL) 325 MG tablet Take 650 mg by mouth every 6 (six) hours as needed for mild pain.    . carbamazepine (TEGRETOL) 200 MG tablet Take 200 mg by mouth every evening.     . gabapentin (NEURONTIN) 300 MG capsule Take 1 capsule (300 mg total) by mouth at bedtime.    . Insulin Glargine (LANTUS SOLOSTAR) 100 UNIT/ML Solostar Pen Inject 10 Units into the skin every morning. And pen needles 1/day 10 pen PRN  . labetalol (NORMODYNE) 300 MG tablet Take 300 mg by mouth 3 (three) times daily.  0  . lactulose (CHRONULAC) 10 GM/15ML solution Take 10 g by mouth 3 (three) times daily.  0  . ondansetron (ZOFRAN) 4 MG tablet Take 1 tablet (4 mg total) by mouth every 8 (eight) hours as needed for nausea or vomiting. 20 tablet 0  . polyethylene glycol (MIRALAX / GLYCOLAX) packet Take 17 g by mouth daily. 14 each 0  . promethazine (PHENERGAN) 25 MG tablet Take 1 tablet (25 mg total) by  mouth every 8 (eight) hours as needed for nausea or vomiting. 10 tablet 0  . sevelamer carbonate (RENVELA) 800 MG tablet Take 800 mg by mouth 3 (three) times daily with meals.     No current facility-administered medications on file prior to visit.     Allergies  Allergen Reactions  . No Known Allergies     Family History  Problem Relation Age of Onset  . Hyperlipidemia Mother   . Asthma Daughter   . Diabetes Maternal Grandmother   . Kidney disease Maternal Grandfather   . Kidney disease Paternal Grandmother     BP 122/72   Pulse 100   Ht 5\' 8"  (1.727 m)   Wt 185 lb (83.9 kg)   SpO2 98%   BMI 28.13 kg/m   Review of Systems Denies LOC    Objective:   Physical Exam VITAL SIGNS:  See vs page.   GENERAL: no distress.   Pulses:  dorsalis pedis intact bilat.   MSK: no deformity of the feet.   CV: 1+ bilat leg edema.   Skin:  no ulcer on the feet.  normal color and temp on the feet.   Neuro: sensation is intact to touch on the feet, but decreased from normal.     Lab Results  Component Value Date   HGBA1C 6.8 (H) 09/04/2016       Assessment & Plan:  Insulin-requiring type 2 DM, with renal insuff, well-controlled Gastroparesis, worse: this is limiting PO intake, so we'll reassess glycemic control when this is better. ESRD: in this context, she is at risk for hypoglycemia.    Patient Instructions  check your blood sugar twice a day.  vary the time of day when you check, between before the 3 meals, and at bedtime.  also check if you have symptoms of your blood sugar being too high or too low.  please keep a record of the readings and bring it to your next appointment here (or you can bring the meter itself).  You can write it on any piece of paper.  please call us sooner if your blood sugar goes below 70, or if you have a lot of readings over 200.   Please continue the same lantus, 10 units each morning.   Please call us next week, to tell us how the blood sugar is doing.   The next in your diabetes is to see how much insulin you need when you are eating better.   Please see the stomach specialist at [facility] next week , as scheduled.   Please come back for a follow-up appointment in 1 month.

## 2016-09-14 NOTE — Patient Instructions (Addendum)
check your blood sugar twice a day.  vary the time of day when you check, between before the 3 meals, and at bedtime.  also check if you have symptoms of your blood sugar being too high or too low.  please keep a record of the readings and bring it to your next appointment here (or you can bring the meter itself).  You can write it on any piece of paper.  please call us sooner if your blood sugar goes below 70, or if you have a lot of readings over 200.   Please continue the same lantus, 10 units each morning.   Please call us next week, to tell us how the blood sugar is doing.   The next in your diabetes is to see how much insulin you need when you are eating better.   Please see the stomach specialist at [facility] next week , as scheduled.   Please come back for a follow-up appointment in 1 month.

## 2016-09-16 DIAGNOSIS — E119 Type 2 diabetes mellitus without complications: Secondary | ICD-10-CM | POA: Insufficient documentation

## 2016-09-28 ENCOUNTER — Other Ambulatory Visit: Payer: Self-pay

## 2016-09-28 DIAGNOSIS — N186 End stage renal disease: Secondary | ICD-10-CM

## 2016-09-28 DIAGNOSIS — Z992 Dependence on renal dialysis: Principal | ICD-10-CM

## 2016-10-03 ENCOUNTER — Ambulatory Visit (HOSPITAL_COMMUNITY)
Admission: RE | Admit: 2016-10-03 | Discharge: 2016-10-03 | Disposition: A | Discharge status: Home / Self Care | Payer: BLUE CROSS/BLUE SHIELD | Source: Ambulatory Visit | Attending: Surgery | Admitting: Surgery

## 2016-10-03 DIAGNOSIS — Z992 Dependence on renal dialysis: Secondary | ICD-10-CM | POA: Diagnosis not present

## 2016-10-03 DIAGNOSIS — N186 End stage renal disease: Secondary | ICD-10-CM | POA: Diagnosis not present

## 2016-10-05 ENCOUNTER — Encounter: Payer: Self-pay | Admitting: Surgery

## 2016-10-09 ENCOUNTER — Ambulatory Visit (HOSPITAL_COMMUNITY): Payer: BLUE CROSS/BLUE SHIELD

## 2016-10-16 ENCOUNTER — Encounter: Payer: BLUE CROSS/BLUE SHIELD | Admitting: Surgery

## 2016-10-16 ENCOUNTER — Ambulatory Visit: Payer: BLUE CROSS/BLUE SHIELD | Admitting: Endocrinology

## 2016-10-25 ENCOUNTER — Other Ambulatory Visit: Payer: Self-pay

## 2016-10-25 MED ORDER — INSULIN PEN NEEDLE 32G X 4 MM MISC: 2 refills | Status: DC

## 2016-10-27 ENCOUNTER — Other Ambulatory Visit: Payer: Self-pay

## 2016-10-27 MED ORDER — INSULIN PEN NEEDLE 32G X 4 MM MISC: 2 refills | Status: DC

## 2016-11-08 ENCOUNTER — Telehealth: Payer: Self-pay | Admitting: *Deleted

## 2016-11-08 NOTE — Telephone Encounter (Signed)
-----   Message from Lujean Amel sent at 11/08/2016  1:15 PM EDT ----- Regarding: Appointment Question Contact: [phone] Hello ladies! So this  patient has cancelled or no-showed for her last two post op appts w/ Dr.Brabham. She called requesting to reschedule it but wants to come in  (if possible) on a Tues or a Thurs because Mondays just don't work for her...she can't remember appts on Mondays. So, can I schedule this post op w/ Vinnie Level? Please advise. Thanks, Anne Ng

## 2016-11-08 NOTE — Telephone Encounter (Signed)
I called [facility] and spoke to the charge nurse about this patient's postop appts. Beth said that patient has a thrill but we do need to see her to check maturation and they have told her this. Patient is MWF via [facility] on evening shift so she is able to come to see [place] in the mornings on Mondays. Please call patient and get her in to see Dr. Trula Slade on a Monday morning asap. They are going to tell her also about the appt.

## 2016-11-09 NOTE — Telephone Encounter (Signed)
Spoke to patient and scheduled an appt for her to see NP on 11/13/16 at 10:45am. Riva per Zigmund Daniel for her to see NP on a day when [facility] will be in the office since Dr.Brabham's schedule is booked until 12/11/16. Pt is aware of the appt/awt

## 2016-11-10 ENCOUNTER — Encounter: Payer: Self-pay | Admitting: Family

## 2016-11-13 ENCOUNTER — Encounter: Payer: BLUE CROSS/BLUE SHIELD | Admitting: Family

## 2016-12-11 ENCOUNTER — Encounter: Payer: BLUE CROSS/BLUE SHIELD | Admitting: Surgery

## 2016-12-12 ENCOUNTER — Encounter: Payer: Self-pay | Admitting: Endocrinology

## 2016-12-12 ENCOUNTER — Ambulatory Visit (INDEPENDENT_AMBULATORY_CARE_PROVIDER_SITE_OTHER): Payer: BLUE CROSS/BLUE SHIELD | Admitting: Endocrinology

## 2016-12-12 ENCOUNTER — Other Ambulatory Visit: Payer: Self-pay

## 2016-12-12 VITALS — BP 160/90 | HR 95 | Wt 189.6 lb

## 2016-12-12 DIAGNOSIS — N912 Amenorrhea, unspecified: Secondary | ICD-10-CM | POA: Diagnosis not present

## 2016-12-12 DIAGNOSIS — Z794 Long term (current) use of insulin: Secondary | ICD-10-CM

## 2016-12-12 DIAGNOSIS — E1122 Type 2 diabetes mellitus with diabetic chronic kidney disease: Secondary | ICD-10-CM

## 2016-12-12 DIAGNOSIS — Z992 Dependence on renal dialysis: Secondary | ICD-10-CM | POA: Diagnosis not present

## 2016-12-12 DIAGNOSIS — N186 End stage renal disease: Secondary | ICD-10-CM | POA: Diagnosis not present

## 2016-12-12 LAB — POCT GLYCOSYLATED HEMOGLOBIN (HGB A1C): HEMOGLOBIN A1C: 8.5

## 2016-12-12 LAB — HCG, QUANTITATIVE, PREGNANCY: QUANTITATIVE HCG: 1.48 m[IU]/mL

## 2016-12-12 LAB — TSH: TSH: 1.33 u[IU]/mL (ref 0.35–4.50)

## 2016-12-12 MED ORDER — INSULIN ISOPHANE HUMAN 100 UNIT/ML KWIKPEN: 25.0000 [IU] | PEN_INJECTOR | SUBCUTANEOUS | 11 refills | Status: DC

## 2016-12-12 MED ORDER — INSULIN ISOPHANE HUMAN 100 UNIT/ML KWIKPEN
25.0000 [IU] | PEN_INJECTOR | SUBCUTANEOUS | 11 refills | Status: DC
Start: 2016-12-12 — End: 2016-12-14

## 2016-12-12 NOTE — Progress Notes (Signed)
Subjective:    Patient ID: Carrie [name], female    DOB: 1980-12-20, 36 y.o.   MRN: [id number]  HPI Pt returns for f/u of diabetes mellitus: DM type: Insulin-requiring type 2 Dx'ed: 4680 Complications: polyneuropathy, renal insufficiency, gastroparesis, and retinopathy Therapy: insulin since 2003 pregnancy GDM: never DKA: never Severe hypoglycemia: last episode was early 2018 Pancreatitis: never.  Pancreatic imaging:  Other: due to noncompliance, she is not a candidate for multiple daily injections Interval history: she takes 15-25 units qd, depending on meal size.  no cbg record, but states cbg's are mildly low (fasting), approx once a week.   Past Medical History:  Diagnosis Date  . Diabetes mellitus without complication ([place])   . Diabetic neuropathy ([place])   . Gastroparesis   . HA (headache)   . Hypertension   . Neuropathy   . Renal disorder   . Trigeminal neuralgia     Past Surgical History:  Procedure Laterality Date  . AV FISTULA PLACEMENT Left 09/07/2016   Procedure: Left arm 1st Stage Basilic AV Fistula;  Surgeon: [name], MD;  Location: [facility] OR;  Service: Vascular;  Laterality: Left;  . EYE SURGERY    . INSERTION OF DIALYSIS CATHETER Right 09/07/2016   Procedure: INSERTION OF DIALYSIS CATHETER;  Surgeon: [name], MD;  Location: [place];  Service: Vascular;  Laterality: Right;  . None      Social History   Social History  . Marital status: Single    Spouse name: N/A  . Number of children: 1  . Years of education: 50   Occupational History  . [employer]   Social History Main Topics  . Smoking status: Never Smoker  . Smokeless tobacco: Never Used  . Alcohol use No     Comment: former use  . Drug use: No  . Sexual activity: Yes   Other Topics Concern  . Not on file   Social History Narrative   Patient lives at home with her father and daughter. Patient works full time at [employer] .   Education some college.   Right  handed   Caffeine Five sodas daily.             Current Outpatient Prescriptions on File Prior to Visit  Medication Sig Dispense Refill  . acetaminophen (TYLENOL) 325 MG tablet Take 650 mg by mouth every 6 (six) hours as needed for mild pain.    . carbamazepine (TEGRETOL) 200 MG tablet Take 200 mg by mouth every evening.     . gabapentin (NEURONTIN) 300 MG capsule Take 1 capsule (300 mg total) by mouth at bedtime.    . Insulin Pen Needle 32G X 4 MM MISC Use to inject insulin 1 time per day. 100 each 2  . labetalol (NORMODYNE) 300 MG tablet Take 300 mg by mouth 3 (three) times daily.  0  . lactulose (CHRONULAC) 10 GM/15ML solution Take 10 g by mouth 3 (three) times daily.  0  . ondansetron (ZOFRAN) 4 MG tablet Take 1 tablet (4 mg total) by mouth every 8 (eight) hours as needed for nausea or vomiting. 20 tablet 0  . polyethylene glycol (MIRALAX / GLYCOLAX) packet Take 17 g by mouth daily. 14 each 0  . promethazine (PHENERGAN) 25 MG tablet Take 1 tablet (25 mg total) by mouth every 8 (eight) hours as needed for nausea or vomiting. 10 tablet 0  . sevelamer carbonate (RENVELA) 800 MG tablet Take 800 mg by mouth 3 (three)  times daily with meals.     No current facility-administered medications on file prior to visit.     Allergies  Allergen Reactions  . No Known Allergies     Family History  Problem Relation Age of Onset  . Hyperlipidemia Mother   . Asthma Daughter   . Diabetes Maternal Grandmother   . Kidney disease Maternal Grandfather   . Kidney disease Paternal Grandmother     BP (!) 160/90   Pulse 95   Wt 189 lb 9.6 oz (86 kg)   SpO2 97%   BMI 28.83 kg/m    Review of Systems denies LOC.  She has amenorrhea x 4 months.     Objective:   Physical Exam VITAL SIGNS:  See vs page GENERAL: no distress Pulses: foot pulses are intact bilaterally.   MSK: no deformity of the feet or ankles.  CV: no edema of the legs or ankles Skin:  no ulcer on the feet or ankles.  normal  color and temp on the feet and ankles Neuro: sensation is intact to touch on the feet and ankles.      Lab Results  Component Value Date   HGBA1C 8.5 12/12/2016      Assessment & Plan:  Insulin-requiring type 2 DM, with gastroparesis: Based on the pattern of her cbg's, she needs a faster-acting qd insulin.  Amenorrhea, new, prob related to renal failure.   Patient Instructions  I have sent a prescription to your pharmacy, to change to NPH, 25 units each morning. blood tests are requested for you today.  We'll let you know about the results. check your blood sugar twice a day.  vary the time of day when you check, between before the 3 meals, and at bedtime.  also check if you have symptoms of your blood sugar being too high or too low.  please keep a record of the readings and bring it to your next appointment here (or you can bring the meter itself).  You can write it on any piece of paper.  please call us sooner if your blood sugar goes below 70, or if you have a lot of readings over 200. Please come back for a follow-up appointment in 2 months.

## 2016-12-12 NOTE — Patient Instructions (Signed)
I have sent a prescription to your pharmacy, to change to NPH, 25 units each morning. blood tests are requested for you today.  We'll let you know about the results. check your blood sugar twice a day.  vary the time of day when you check, between before the 3 meals, and at bedtime.  also check if you have symptoms of your blood sugar being too high or too low.  please keep a record of the readings and bring it to your next appointment here (or you can bring the meter itself).  You can write it on any piece of paper.  please call us sooner if your blood sugar goes below 70, or if you have a lot of readings over 200. Please come back for a follow-up appointment in 2 months.

## 2016-12-13 LAB — PROLACTIN: PROLACTIN: 618.9 ng/mL — AB

## 2016-12-14 ENCOUNTER — Telehealth: Payer: Self-pay | Admitting: Endocrinology

## 2016-12-14 ENCOUNTER — Other Ambulatory Visit: Payer: BLUE CROSS/BLUE SHIELD

## 2016-12-14 MED ORDER — INSULIN NPH (HUMAN) (ISOPHANE) 100 UNIT/ML ~~LOC~~ SUSP: 25.0000 [IU] | SUBCUTANEOUS | 11 refills | Status: DC

## 2016-12-14 NOTE — Telephone Encounter (Signed)
please call patient: Ins won't pay for the NPH pen, so I have sent a prescription to your pharmacy, to change to bottle.

## 2016-12-15 NOTE — Telephone Encounter (Signed)
Patient notified

## 2016-12-19 ENCOUNTER — Encounter: Payer: Self-pay | Admitting: Podiatry

## 2016-12-19 ENCOUNTER — Ambulatory Visit (INDEPENDENT_AMBULATORY_CARE_PROVIDER_SITE_OTHER): Payer: BLUE CROSS/BLUE SHIELD | Admitting: Podiatry

## 2016-12-19 VITALS — BP 190/117 | HR 97

## 2016-12-19 DIAGNOSIS — T148XXA Other injury of unspecified body region, initial encounter: Secondary | ICD-10-CM | POA: Diagnosis not present

## 2016-12-19 NOTE — Progress Notes (Signed)
   Subjective:    Patient ID: Carrie [name], female    DOB: 1980/11/04, 36 y.o.   MRN: [id number]  HPI this diabetic patient presents the office with chief complaint of a blackened area under her big toenail, right foot.  Patient says this blackened area has been present for approximately 4 weeks.  Patient states that she does not remember injuring her toenail on her right big toe.  She states that there has been no drainage or pain associated with the blackened area.  She presents the office today for an evaluation and treatment of this condition.  Patient states that she is diabetic with kidney disease.    Review of Systems  Constitutional: Positive for appetite change and fatigue.  Gastrointestinal: Positive for constipation, diarrhea, nausea and vomiting.  Neurological: Positive for weakness and numbness.       Objective:   Physical Exam GENERAL APPEARANCE: Alert, conversant. Appropriately groomed. No acute distress.  VASCULAR: Pedal pulses are  palpable at  [facility] and PT bilateral.  Capillary refill time is immediate to all digits,  Normal temperature gradient.  Digital hair growth is present bilateral  NEUROLOGIC: sensation is normal to 5.07 monofilament at 5/5 sites bilateral.  Light touch is intact bilateral, Muscle strength normal.  MUSCULOSKELETAL: acceptable muscle strength, tone and stability bilateral.  Intrinsic muscluature intact bilateral.  Rectus appearance of foot and digits noted bilateral.  NAILS  there is evidence of fungal infection noted to multiple nails on both feet.  There is a blackened area noted on the lateral nail groove right hallux.  No evidence of any drainage.  The lateral nail  is unattached from the nailbed and is distal to the blackened area right hallux. DERMATOLOGIC: skin color, texture, and turgor are within normal limits.  No preulcerative lesions or ulcers  are seen, no interdigital maceration noted.  No open lesions present.  . No drainage  noted.         Assessment & Plan:  Subungual hematoma right hallux.  IE  . Examination of her right hallux nail does reveal the distal aspect of the lateral border is unattached from the nailbed.  This is distal to the hematoma that is noted under the lateral aspect of the nail plate. RTC prn   Gardiner Barefoot DPM

## 2016-12-21 ENCOUNTER — Other Ambulatory Visit (INDEPENDENT_AMBULATORY_CARE_PROVIDER_SITE_OTHER): Payer: BLUE CROSS/BLUE SHIELD

## 2016-12-21 DIAGNOSIS — N912 Amenorrhea, unspecified: Secondary | ICD-10-CM

## 2016-12-21 LAB — HCG, QUANTITATIVE, PREGNANCY: QUANTITATIVE HCG: 41.46 m[IU]/mL

## 2016-12-22 ENCOUNTER — Telehealth: Payer: Self-pay

## 2016-12-22 ENCOUNTER — Ambulatory Visit (INDEPENDENT_AMBULATORY_CARE_PROVIDER_SITE_OTHER): Payer: BLUE CROSS/BLUE SHIELD | Admitting: Endocrinology

## 2016-12-22 ENCOUNTER — Encounter: Payer: Self-pay | Admitting: Endocrinology

## 2016-12-22 DIAGNOSIS — Z3201 Encounter for pregnancy test, result positive: Secondary | ICD-10-CM | POA: Diagnosis not present

## 2016-12-22 MED ORDER — INSULIN ASPART 100 UNIT/ML FLEXPEN: 5.0000 [IU] | PEN_INJECTOR | Freq: Three times a day (TID) | SUBCUTANEOUS | 11 refills | Status: DC

## 2016-12-22 MED ORDER — INSULIN GLARGINE 100 UNIT/ML SOLOSTAR PEN: 10.0000 [IU] | PEN_INJECTOR | Freq: Every day | SUBCUTANEOUS | 99 refills | Status: DC

## 2016-12-22 NOTE — Telephone Encounter (Signed)
Notified patient that we made her an appt for 9/11 at 10am at [place] [facility].

## 2016-12-22 NOTE — Telephone Encounter (Signed)
Called patient to see if she could come in today for an appt at 36 but for Dr. Cordelia Pen 11:15 slot.

## 2016-12-22 NOTE — Telephone Encounter (Signed)
Patient returning missed phone call. Scheduled patient in Dr. Cordelia Pen 11:15 slot per note from [place]. Patient stated she would arrive at 11am.

## 2016-12-22 NOTE — Progress Notes (Signed)
Called patient & is coming in for appt at 11:15.

## 2016-12-22 NOTE — Patient Instructions (Addendum)
Please see an OB/GYN specialist.  you will receive a phone call at dialysis today, about a day and time for an appointment.   check your blood sugar 5 times a day: before the 3 meals, and at bedtime.  also check after meals, on a rotating basis.  if you have symptoms of your blood sugar being too high or too low.  please keep a record of the readings and bring it to your next appointment here (or you can bring the meter itself).  You can write it on any piece of paper.  please call us sooner if your blood sugar goes below 70, or if you have a lot of readings over 200.  Please stop taking the carbamazepine.  Please come back for a follow-up appointment next week.

## 2016-12-22 NOTE — Progress Notes (Signed)
Subjective:    Patient ID: Carrie [name], female    DOB: 1980/09/30, 36 y.o.   MRN: [id number]  HPI  Pt returns for f/u of diabetes mellitus: DM type: Insulin-requiring type 2 Dx'ed: 0254 Complications: polyneuropathy, renal insufficiency, gastroparesis, and retinopathy Therapy: insulin since 2003 pregnancy GDM: never DKA: never Severe hypoglycemia: last episode was early 2018 Pancreatitis: never.  Pancreatic imaging:  Other: due to noncompliance, she is not a candidate for multiple daily injections Interval history:  No cbg record.  She does not have an OB/GYN. Past Medical History:  Diagnosis Date  . Diabetes mellitus without complication ([place])   . Diabetic neuropathy ([place])   . Gastroparesis   . HA (headache)   . Hypertension   . Neuropathy   . Renal disorder   . Trigeminal neuralgia     Past Surgical History:  Procedure Laterality Date  . AV FISTULA PLACEMENT Left 09/07/2016   Procedure: Left arm 1st Stage Basilic AV Fistula;  Surgeon: [name], MD;  Location: [facility] OR;  Service: Vascular;  Laterality: Left;  . EYE SURGERY    . INSERTION OF DIALYSIS CATHETER Right 09/07/2016   Procedure: INSERTION OF DIALYSIS CATHETER;  Surgeon: [name], MD;  Location: [place];  Service: Vascular;  Laterality: Right;  . None      Social History   Social History  . Marital status: Single    Spouse name: N/A  . Number of children: 1  . Years of education: 56   Occupational History  . [employer]   Social History Main Topics  . Smoking status: Never Smoker  . Smokeless tobacco: Never Used  . Alcohol use No     Comment: former use  . Drug use: No  . Sexual activity: Yes   Other Topics Concern  . Not on file   Social History Narrative   Patient lives at home with her father and daughter. Patient works full time at [employer] .   Education some college.   Right handed   Caffeine Five sodas daily.             Current Outpatient Prescriptions  on File Prior to Visit  Medication Sig Dispense Refill  . acetaminophen (TYLENOL) 325 MG tablet Take 650 mg by mouth every 6 (six) hours as needed for mild pain.    Marland Kitchen gabapentin (NEURONTIN) 300 MG capsule Take 1 capsule (300 mg total) by mouth at bedtime.    . Insulin Pen Needle 32G X 4 MM MISC Use to inject insulin 1 time per day. 100 each 2  . labetalol (NORMODYNE) 300 MG tablet Take 300 mg by mouth 3 (three) times daily.  0  . lactulose (CHRONULAC) 10 GM/15ML solution Take 10 g by mouth 3 (three) times daily.  0  . ondansetron (ZOFRAN) 4 MG tablet Take 1 tablet (4 mg total) by mouth every 8 (eight) hours as needed for nausea or vomiting. 20 tablet 0  . polyethylene glycol (MIRALAX / GLYCOLAX) packet Take 17 g by mouth daily. 14 each 0  . promethazine (PHENERGAN) 25 MG tablet Take 1 tablet (25 mg total) by mouth every 8 (eight) hours as needed for nausea or vomiting. 10 tablet 0  . sevelamer carbonate (RENVELA) 800 MG tablet Take 800 mg by mouth 3 (three) times daily with meals.     No current facility-administered medications on file prior to visit.     Allergies  Allergen Reactions  . No Known Allergies  Family History  Problem Relation Age of Onset  . Hyperlipidemia Mother   . Asthma Daughter   . Diabetes Maternal Grandmother   . Kidney disease Maternal Grandfather   . Kidney disease Paternal Grandmother     BP (!) 182/113   Pulse (!) 103   Wt 197 lb (89.4 kg)   SpO2 98%   BMI 29.95 kg/m     Review of Systems No change in chronic nausea.     Objective:   Physical Exam VITAL SIGNS:  See vs page GENERAL: no distress Pulses: foot pulses are intact bilaterally.   MSK: no deformity of the feet or ankles.  CV: no edema of the legs or ankles Skin:  no ulcer on the feet or ankles.  normal color and temp on the feet and ankles Neuro: sensation is intact to touch on the feet and ankles.      Lab Results  Component Value Date   HGBA1C 8.5 12/12/2016   hCG=40      Assessment & Plan:  Type 1 DM: in view of the below, she agrees to take multiple daily injections Amenorrhea: uncertain if this is pregnancy Renal failure: this may be increasing hCG, but we need to assume pregnancy until proven otherwise Trigeminal neuralgia: in case of pregnancy, she needs to d/c tegretol   Patient Instructions  Please see an OB/GYN specialist.  you will receive a phone call at dialysis today, about a day and time for an appointment.   check your blood sugar 5 times a day: before the 3 meals, and at bedtime.  also check after meals, on a rotating basis.  if you have symptoms of your blood sugar being too high or too low.  please keep a record of the readings and bring it to your next appointment here (or you can bring the meter itself).  You can write it on any piece of paper.  please call us sooner if your blood sugar goes below 70, or if you have a lot of readings over 200.  Please stop taking the carbamazepine.  Please come back for a follow-up appointment next week.

## 2016-12-26 ENCOUNTER — Encounter (HOSPITAL_COMMUNITY): Payer: Self-pay | Admitting: *Deleted

## 2016-12-26 ENCOUNTER — Other Ambulatory Visit (HOSPITAL_COMMUNITY): Payer: Self-pay | Admitting: *Deleted

## 2016-12-26 ENCOUNTER — Ambulatory Visit (HOSPITAL_COMMUNITY)
Admission: RE | Admit: 2016-12-26 | Discharge: 2016-12-26 | Disposition: A | Discharge status: Home / Self Care | Payer: BLUE CROSS/BLUE SHIELD | Source: Ambulatory Visit | Attending: Obstetrics and Gynecology | Admitting: Obstetrics and Gynecology

## 2016-12-26 ENCOUNTER — Ambulatory Visit (HOSPITAL_COMMUNITY)
Admission: RE | Admit: 2016-12-26 | Discharge: 2016-12-26 | Disposition: A | Discharge status: Home / Self Care | Payer: BLUE CROSS/BLUE SHIELD | Source: Ambulatory Visit | Attending: Family Medicine | Admitting: Family Medicine

## 2016-12-26 VITALS — BP 136/78 | HR 97 | Wt 189.6 lb

## 2016-12-26 DIAGNOSIS — N186 End stage renal disease: Secondary | ICD-10-CM

## 2016-12-26 DIAGNOSIS — N912 Amenorrhea, unspecified: Secondary | ICD-10-CM | POA: Insufficient documentation

## 2016-12-26 DIAGNOSIS — Z3201 Encounter for pregnancy test, result positive: Secondary | ICD-10-CM | POA: Insufficient documentation

## 2016-12-26 LAB — HCG, QUANTITATIVE, PREGNANCY: hCG, Beta Chain, Quant, S: 2 m[IU]/mL (ref <5)

## 2016-12-26 NOTE — Progress Notes (Signed)
Addendum: Beta HCG level was 2 indicating nonpregnant state. I discussed these results by phone with Ms. Carrie [name]

## 2016-12-26 NOTE — Progress Notes (Signed)
Carrie [name] comes in today for high risk consultation. She has a long history of diabetes and sees Dr. Loanne Drilling of [facility]. She has ESRD and gets MWF dialysis through Dr. Joelyn Oms. She also has DM-based retinopathy, gastroparesis, peripheral nephropathy and trigeminal neuralgia. She is currently undergoing evaluation for transplant at [facility]. Her most recent serum creatinine was at [facility] and was 7.93 Her pregnancy test was positive 2 weeks ago. A quantitative beta HCG on 9/6 by Dr. Loanne Drilling was 41.5. This indicates that it is almost certain that her pregnancy test is not due to a pregnancy but rather an elevated beta HCG due to ESRD and is often seen in ESRD patients on dialysis. The average limit for pregnancy testing is 25, so a test done 2 weeks ago, assuming minimal values should have been >1000 when checked on 9/6.  We will draw a quantitative beta HCG today. If it is less than 160 we are dealing with an ESRD-related false positive.  I went over with the patient the very difficult path a pregnancy would entail. Most women with severe ESRD like hers would have dialysis 4-6 times a week, with most likely daily inpatient dialysis by the third trimester. There is a significant risk of permanent deterioration of the renal function during the pregnancy.  I will contact her with her HCG result as soon as it is available. If it indicates pregnancy she will follow up next week for full consultation.  I spent 15 mins in consultation with this patient, >50% in face-to-face counseling

## 2017-01-01 ENCOUNTER — Telehealth: Payer: Self-pay

## 2017-01-01 NOTE — Telephone Encounter (Signed)
PA sent 12/29/16 for Humulin N. Medicaqtion APPROVED via [employer]. Information sent to pharmacy.

## 2017-01-02 ENCOUNTER — Ambulatory Visit (INDEPENDENT_AMBULATORY_CARE_PROVIDER_SITE_OTHER): Payer: Self-pay | Admitting: Family

## 2017-01-02 ENCOUNTER — Encounter: Payer: Self-pay | Admitting: Family

## 2017-01-02 VITALS — BP 134/86 | HR 93 | Temp 97.6°F | Ht 68.0 in | Wt 190.0 lb

## 2017-01-02 DIAGNOSIS — I770 Arteriovenous fistula, acquired: Secondary | ICD-10-CM

## 2017-01-02 DIAGNOSIS — Z992 Dependence on renal dialysis: Secondary | ICD-10-CM

## 2017-01-02 DIAGNOSIS — N186 End stage renal disease: Secondary | ICD-10-CM

## 2017-01-02 NOTE — Progress Notes (Signed)
    Postoperative Access Visit   History of Present Illness  Carrie [name] is a 36 y.o. year old female who is s/p first stage basilic vein fistula creation and US guided placement of right IJ tunneled dialysis catheter on 09-07-16 by Dr. Trula Slade.  This is her first postoperative follow up; it seems she missed or did not show for scheduled appointments.   She had a positive pregnancy test earlier this month, but states her hormone levels are decreasing, so she states it appears she has lost the pregnancy.   She dialyzes M-W-F via right IJ catheter at [facility].  She is right hand dominant.   The patient's wounds are healed.  The patient notes no steal symptoms.  The patient is able to complete their activities of daily living.     For VQI Use Only  PRE-ADM LIVING: Home  AMB STATUS: Ambulatory  Physical Examination Vitals:   01/02/17 1549  BP: 134/86  Pulse: 93  Temp: 97.6 F (36.4 C)  TempSrc: Oral  SpO2: 98%  Weight: 190 lb (86.2 kg)  Height: 5\' 8"  (1.727 m)   Body mass index is 28.89 kg/m.   Left antecubital incision is healed, skin feels warm and normal, hand grip is 5/5, sensation in digits is intact, palpable thrill, bruit can be auscultated   Medical Decision Making  Carrie [name] is a 36 y.o. year old female who presents s/p first stage basilic vein fistula creation and US guided placement of right IJ tunneled dialysis catheter on 09-07-16. I discussed with Dr. Donnetta Hutching pt HPI, and reviewed AV fistula duplex results from June of 2018. The vein looks marginal from the duplex results, but will go ahead and schedule second stage superficialization, possible insertion of AV graft by Dr. Trula Slade.    Thank you for allowing [place] to participate in this patient's care.  NICKEL, Sharmon Leyden, RN, MSN, FNP-C [facility] Office: [phone]  01/02/2017, 3:55 PM  Clinic MD: Early

## 2017-01-04 ENCOUNTER — Other Ambulatory Visit: Payer: Self-pay

## 2017-01-04 ENCOUNTER — Ambulatory Visit (HOSPITAL_COMMUNITY): Admission: RE | Admit: 2017-01-04 | Payer: BLUE CROSS/BLUE SHIELD | Source: Ambulatory Visit

## 2017-01-09 ENCOUNTER — Encounter (HOSPITAL_COMMUNITY): Payer: Self-pay | Admitting: *Deleted

## 2017-01-09 ENCOUNTER — Inpatient Hospital Stay (HOSPITAL_COMMUNITY)
Admission: EM | Admit: 2017-01-09 | Discharge: 2017-01-24 | DRG: 264 | Disposition: A | Discharge status: Home / Self Care | Payer: BLUE CROSS/BLUE SHIELD | Attending: Oncology | Admitting: Oncology

## 2017-01-09 DIAGNOSIS — N2581 Secondary hyperparathyroidism of renal origin: Secondary | ICD-10-CM | POA: Diagnosis present

## 2017-01-09 DIAGNOSIS — R931 Abnormal findings on diagnostic imaging of heart and coronary circulation: Secondary | ICD-10-CM

## 2017-01-09 DIAGNOSIS — Z794 Long term (current) use of insulin: Secondary | ICD-10-CM

## 2017-01-09 DIAGNOSIS — M898X9 Other specified disorders of bone, unspecified site: Secondary | ICD-10-CM | POA: Diagnosis present

## 2017-01-09 DIAGNOSIS — I519 Heart disease, unspecified: Secondary | ICD-10-CM | POA: Diagnosis not present

## 2017-01-09 DIAGNOSIS — E1065 Type 1 diabetes mellitus with hyperglycemia: Secondary | ICD-10-CM | POA: Diagnosis present

## 2017-01-09 DIAGNOSIS — Z841 Family history of disorders of kidney and ureter: Secondary | ICD-10-CM

## 2017-01-09 DIAGNOSIS — E871 Hypo-osmolality and hyponatremia: Secondary | ICD-10-CM | POA: Diagnosis not present

## 2017-01-09 DIAGNOSIS — K3184 Gastroparesis: Secondary | ICD-10-CM | POA: Diagnosis not present

## 2017-01-09 DIAGNOSIS — I513 Intracardiac thrombosis, not elsewhere classified: Secondary | ICD-10-CM | POA: Diagnosis not present

## 2017-01-09 DIAGNOSIS — E1022 Type 1 diabetes mellitus with diabetic chronic kidney disease: Secondary | ICD-10-CM | POA: Diagnosis not present

## 2017-01-09 DIAGNOSIS — Z833 Family history of diabetes mellitus: Secondary | ICD-10-CM

## 2017-01-09 DIAGNOSIS — D631 Anemia in chronic kidney disease: Secondary | ICD-10-CM | POA: Diagnosis present

## 2017-01-09 DIAGNOSIS — N185 Chronic kidney disease, stage 5: Secondary | ICD-10-CM | POA: Diagnosis present

## 2017-01-09 DIAGNOSIS — I120 Hypertensive chronic kidney disease with stage 5 chronic kidney disease or end stage renal disease: Secondary | ICD-10-CM | POA: Diagnosis not present

## 2017-01-09 DIAGNOSIS — K5900 Constipation, unspecified: Secondary | ICD-10-CM | POA: Diagnosis not present

## 2017-01-09 DIAGNOSIS — E1042 Type 1 diabetes mellitus with diabetic polyneuropathy: Secondary | ICD-10-CM | POA: Diagnosis present

## 2017-01-09 DIAGNOSIS — E1043 Type 1 diabetes mellitus with diabetic autonomic (poly)neuropathy: Secondary | ICD-10-CM | POA: Diagnosis present

## 2017-01-09 DIAGNOSIS — G500 Trigeminal neuralgia: Secondary | ICD-10-CM | POA: Diagnosis not present

## 2017-01-09 DIAGNOSIS — I10 Essential (primary) hypertension: Secondary | ICD-10-CM

## 2017-01-09 DIAGNOSIS — E1122 Type 2 diabetes mellitus with diabetic chronic kidney disease: Secondary | ICD-10-CM

## 2017-01-09 DIAGNOSIS — Z825 Family history of asthma and other chronic lower respiratory diseases: Secondary | ICD-10-CM

## 2017-01-09 DIAGNOSIS — E1040 Type 1 diabetes mellitus with diabetic neuropathy, unspecified: Secondary | ICD-10-CM | POA: Diagnosis not present

## 2017-01-09 DIAGNOSIS — E119 Type 2 diabetes mellitus without complications: Secondary | ICD-10-CM

## 2017-01-09 DIAGNOSIS — Z992 Dependence on renal dialysis: Secondary | ICD-10-CM | POA: Diagnosis not present

## 2017-01-09 DIAGNOSIS — I5189 Other ill-defined heart diseases: Secondary | ICD-10-CM | POA: Diagnosis present

## 2017-01-09 DIAGNOSIS — N186 End stage renal disease: Secondary | ICD-10-CM | POA: Diagnosis not present

## 2017-01-09 DIAGNOSIS — Z9114 Patient's other noncompliance with medication regimen: Secondary | ICD-10-CM

## 2017-01-09 DIAGNOSIS — Z79899 Other long term (current) drug therapy: Secondary | ICD-10-CM | POA: Diagnosis not present

## 2017-01-09 DIAGNOSIS — E10649 Type 1 diabetes mellitus with hypoglycemia without coma: Secondary | ICD-10-CM | POA: Diagnosis not present

## 2017-01-09 DIAGNOSIS — Z8349 Family history of other endocrine, nutritional and metabolic diseases: Secondary | ICD-10-CM

## 2017-01-09 DIAGNOSIS — Q211 Atrial septal defect: Secondary | ICD-10-CM

## 2017-01-09 HISTORY — DX: Dependence on renal dialysis: Z99.2

## 2017-01-09 LAB — COMPREHENSIVE METABOLIC PANEL
ALBUMIN: 3.5 g/dL (ref 3.5–5.0)
ALT: 13 U/L — ABNORMAL LOW (ref 14–54)
ANION GAP: 14 (ref 5–15)
AST: 14 U/L — AB (ref 15–41)
Alkaline Phosphatase: 77 U/L (ref 38–126)
BILIRUBIN TOTAL: 0.8 mg/dL (ref 0.3–1.2)
BUN: 55 mg/dL — AB (ref 6–20)
CHLORIDE: 91 mmol/L — AB (ref 101–111)
CO2: 27 mmol/L (ref 22–32)
Calcium: 8.7 mg/dL — ABNORMAL LOW (ref 8.9–10.3)
Creatinine, Ser: 7.73 mg/dL — ABNORMAL HIGH (ref 0.44–1.00)
GFR calc Af Amer: 7 mL/min — ABNORMAL LOW (ref >60)
GFR, EST NON AFRICAN AMERICAN: 6 mL/min — AB (ref >60)
Glucose, Bld: 309 mg/dL — ABNORMAL HIGH (ref 65–99)
Potassium: 5.1 mmol/L (ref 3.5–5.1)
Sodium: 132 mmol/L — ABNORMAL LOW (ref 135–145)
TOTAL PROTEIN: 7.1 g/dL (ref 6.5–8.1)

## 2017-01-09 LAB — CBC WITH DIFFERENTIAL/PLATELET
BASOS ABS: 0 10*3/uL (ref 0.0–0.1)
BASOS PCT: 1 %
EOS PCT: 2 %
Eosinophils Absolute: 0.1 10*3/uL (ref 0.0–0.7)
HCT: 32.4 % — ABNORMAL LOW (ref 36.0–46.0)
Hemoglobin: 10.6 g/dL — ABNORMAL LOW (ref 12.0–15.0)
Lymphocytes Relative: 27 %
Lymphs Abs: 1.9 10*3/uL (ref 0.7–4.0)
MCH: 30.4 pg (ref 26.0–34.0)
MCHC: 32.7 g/dL (ref 30.0–36.0)
MCV: 92.8 fL (ref 78.0–100.0)
MONO ABS: 0.2 10*3/uL (ref 0.1–1.0)
MONOS PCT: 3 %
Neutro Abs: 4.8 10*3/uL (ref 1.7–7.7)
Neutrophils Relative %: 67 %
PLATELETS: 261 10*3/uL (ref 150–400)
RBC: 3.49 MIL/uL — ABNORMAL LOW (ref 3.87–5.11)
RDW: 16.4 % — AB (ref 11.5–15.5)
WBC: 7.1 10*3/uL (ref 4.0–10.5)

## 2017-01-09 LAB — PROTIME-INR
INR: 1.03
PROTHROMBIN TIME: 13.4 s (ref 11.4–15.2)

## 2017-01-09 LAB — GLUCOSE, CAPILLARY
GLUCOSE-CAPILLARY: 301 mg/dL — AB (ref 65–99)
Glucose-Capillary: 265 mg/dL — ABNORMAL HIGH (ref 65–99)

## 2017-01-09 LAB — CBG MONITORING, ED: Glucose-Capillary: 233 mg/dL — ABNORMAL HIGH (ref 65–99)

## 2017-01-09 MED ORDER — MORPHINE SULFATE (PF) 4 MG/ML IV SOLN
4.0000 mg | Freq: Once | INTRAVENOUS | Status: AC
  Administered 2017-01-09: 4 mg via INTRAVENOUS
  Filled 2017-01-09: qty 1

## 2017-01-09 MED ORDER — ACETAMINOPHEN 325 MG PO TABS
650.0000 mg | ORAL_TABLET | Freq: Four times a day (QID) | ORAL | Status: DC | PRN
  Administered 2017-01-14 – 2017-01-16 (×3): 650 mg via ORAL
  Filled 2017-01-09 (×3): qty 2

## 2017-01-09 MED ORDER — METOCLOPRAMIDE HCL 10 MG PO TABS: 5.0000 mg | ORAL_TABLET | Freq: Three times a day (TID) | ORAL | Status: DC

## 2017-01-09 MED ORDER — RAMELTEON 8 MG PO TABS
8.0000 mg | ORAL_TABLET | Freq: Every day | ORAL | Status: DC
  Administered 2017-01-09 – 2017-01-23 (×15): 8 mg via ORAL
  Filled 2017-01-09 (×15): qty 1

## 2017-01-09 MED ORDER — ACETAMINOPHEN 650 MG RE SUPP: 650.0000 mg | Freq: Four times a day (QID) | RECTAL | Status: DC | PRN

## 2017-01-09 MED ORDER — CALCIUM CARBONATE ANTACID 500 MG PO CHEW
1.0000 | CHEWABLE_TABLET | Freq: Three times a day (TID) | ORAL | Status: DC
  Administered 2017-01-10 – 2017-01-14 (×12): 200 mg via ORAL
  Filled 2017-01-09 (×13): qty 1

## 2017-01-09 MED ORDER — CARBAMAZEPINE ER 200 MG PO TB12
200.0000 mg | ORAL_TABLET | Freq: Every day | ORAL | Status: DC
  Administered 2017-01-09 – 2017-01-23 (×15): 200 mg via ORAL
  Filled 2017-01-09 (×15): qty 1

## 2017-01-09 MED ORDER — ONDANSETRON HCL 4 MG/2ML IJ SOLN
4.0000 mg | Freq: Once | INTRAMUSCULAR | Status: AC
  Administered 2017-01-09: 4 mg via INTRAVENOUS
  Filled 2017-01-09: qty 2

## 2017-01-09 MED ORDER — METOCLOPRAMIDE HCL 5 MG PO TABS
5.0000 mg | ORAL_TABLET | Freq: Three times a day (TID) | ORAL | Status: DC
  Administered 2017-01-09 – 2017-01-24 (×39): 5 mg via ORAL
  Filled 2017-01-09 (×41): qty 1

## 2017-01-09 MED ORDER — SEVELAMER CARBONATE 800 MG PO TABS
800.0000 mg | ORAL_TABLET | Freq: Three times a day (TID) | ORAL | Status: DC
  Administered 2017-01-10 – 2017-01-24 (×32): 800 mg via ORAL
  Filled 2017-01-09 (×33): qty 1

## 2017-01-09 MED ORDER — SODIUM CHLORIDE 0.9 % IV BOLUS (SEPSIS): 500.0000 mL | Freq: Once | INTRAVENOUS | Status: DC

## 2017-01-09 MED ORDER — INSULIN GLARGINE 100 UNIT/ML ~~LOC~~ SOLN
15.0000 [IU] | Freq: Every day | SUBCUTANEOUS | Status: DC
  Filled 2017-01-09: qty 0.15

## 2017-01-09 MED ORDER — GABAPENTIN 300 MG PO CAPS
300.0000 mg | ORAL_CAPSULE | Freq: Every day | ORAL | Status: DC
  Administered 2017-01-09 – 2017-01-23 (×15): 300 mg via ORAL
  Filled 2017-01-09 (×15): qty 1

## 2017-01-09 MED ORDER — HEPARIN SODIUM (PORCINE) 5000 UNIT/ML IJ SOLN
5000.0000 [IU] | Freq: Three times a day (TID) | INTRAMUSCULAR | Status: DC
  Administered 2017-01-09 – 2017-01-11 (×3): 5000 [IU] via SUBCUTANEOUS
  Filled 2017-01-09 (×3): qty 1

## 2017-01-09 MED ORDER — CALCITRIOL 0.25 MCG PO CAPS
0.2500 ug | ORAL_CAPSULE | ORAL | Status: DC
  Administered 2017-01-10 – 2017-01-24 (×7): 0.25 ug via ORAL
  Filled 2017-01-09 (×2): qty 1

## 2017-01-09 MED ORDER — SODIUM CHLORIDE 0.9% FLUSH
3.0000 mL | Freq: Two times a day (BID) | INTRAVENOUS | Status: DC
  Administered 2017-01-09 – 2017-01-23 (×14): 3 mL via INTRAVENOUS

## 2017-01-09 MED ORDER — PROMETHAZINE HCL 25 MG PO TABS
12.5000 mg | ORAL_TABLET | Freq: Four times a day (QID) | ORAL | Status: DC | PRN
  Administered 2017-01-09 – 2017-01-15 (×11): 12.5 mg via ORAL
  Filled 2017-01-09 (×11): qty 1

## 2017-01-09 MED ORDER — INSULIN ASPART 100 UNIT/ML ~~LOC~~ SOLN
0.0000 [IU] | SUBCUTANEOUS | Status: DC
  Administered 2017-01-09: 5 [IU] via SUBCUTANEOUS
  Administered 2017-01-09: 11 [IU] via SUBCUTANEOUS
  Filled 2017-01-09: qty 1

## 2017-01-09 MED ORDER — LABETALOL HCL 300 MG PO TABS
300.0000 mg | ORAL_TABLET | Freq: Two times a day (BID) | ORAL | Status: DC
  Administered 2017-01-09 – 2017-01-24 (×28): 300 mg via ORAL
  Filled 2017-01-09 (×29): qty 1

## 2017-01-09 MED ORDER — SENNOSIDES-DOCUSATE SODIUM 8.6-50 MG PO TABS
1.0000 | ORAL_TABLET | Freq: Every evening | ORAL | Status: DC | PRN
  Administered 2017-01-12 – 2017-01-13 (×2): 1 via ORAL
  Filled 2017-01-09 (×2): qty 1

## 2017-01-09 NOTE — ED Notes (Signed)
Iv team at bedside  

## 2017-01-09 NOTE — Consult Note (Signed)
CARDIOLOGY CONSULT NOTE  Patient ID: Carrie [name] MRN: [id number] DOB/AGE: 01/10/1981 35 y.o.  Admit date: 01/09/2017 Primary Physician Glendon Axe, MD Primary Cardiologist None Chief Complaint  Right atrial mass Requesting  Dr. Lynnae January  HPI:  We are asked to see this patient with a right atrial mass.  She is undergoing work up for renal transplant.  She has been seen at [facility].  As part of this work up she had an echo today with a large right atrial echodensity that was in close proximity to the dialysis catheter but did not appear to be attached per their report.  There is mild TR and PI.  It was suggested that she should be admitted for TEE and possible MRI.    She has had no acute symptoms.  She has been on dialysis since this spring.  She gets SOB climbing the stairs on the dialysis days after dialysis.  She also reports that she had syncope during dialysis recently with low BPs.  However, she had her dose of labetalol reduced and has not had this happen since then.  Otherwise she does well on non dialysis days.  The patient denies any new symptoms such as chest discomfort, neck or arm discomfort. There has been no new  PND or orthopnea. She has had no fevers or chills.  She does have occasional palpitations at night but these are mild and isolated.    Of note she does have a dialysis catheter in her chest.  Her fistula apparently is too small to use.  She is to have graft placed.    Past Medical History:  Diagnosis Date  . Diabetes mellitus without complication ([place])   . Diabetic neuropathy ([place])   . Gastroparesis   . HA (headache)   . Hemodialysis access site with arteriovenous graft ([place])   . Hypertension   . Neuropathy   . Renal disorder   . Trigeminal neuralgia     Past Surgical History:  Procedure Laterality Date  . AV FISTULA PLACEMENT Left 09/07/2016   Procedure: Left arm 1st Stage Basilic AV Fistula;  Surgeon: [name], MD;  Location: [facility] OR;  Service: Vascular;   Laterality: Left;  . EYE SURGERY    . INSERTION OF DIALYSIS CATHETER Right 09/07/2016   Procedure: INSERTION OF DIALYSIS CATHETER;  Surgeon: [name], MD;  Location: [place];  Service: Vascular;  Laterality: Right;  . None      Allergies  Allergen Reactions  . No Known Allergies    Prior to Admission medications   Medication Sig Start Date End Date Taking? Authorizing Provider  acetaminophen (TYLENOL) 325 MG tablet Take 650 mg by mouth every 6 (six) hours as needed for mild pain.    [provider]  carbamazepine (TEGRETOL XR) 400 MG 12 hr tablet Take 200 mg by mouth once.    [provider]  gabapentin (NEURONTIN) 300 MG capsule Take 1 capsule (300 mg total) by mouth at bedtime. 09/12/16   Rosita Fire, MD  insulin aspart (NOVOLOG FLEXPEN) 100 UNIT/ML FlexPen Inject 5 Units into the skin 3 (three) times daily with meals. And pen needles 4/day 12/22/16   Renato Shin, MD  Insulin Glargine (LANTUS SOLOSTAR) 100 UNIT/ML Solostar Pen Inject 10 Units into the skin daily at 10 pm. 12/22/16   Renato Shin, MD  Insulin Pen Needle 32G X 4 MM MISC Use to inject insulin 1 time per day. 10/27/16   Renato Shin, MD  labetalol ([place])  300 MG tablet Take 300 mg by mouth 2 daily. 08/25/16   [provider]  Metoclopramide HCl (REGLAN PO) Take by mouth.    [provider]  ondansetron (ZOFRAN) 4 MG tablet Take 1 tablet (4 mg total) by mouth every 8 (eight) hours as needed for nausea or vomiting. 09/12/16   Rosita Fire, MD  promethazine (PHENERGAN) 25 MG tablet Take 1 tablet (25 mg total) by mouth every 8 (eight) hours as needed for nausea or vomiting. 09/12/16   Rosita Fire, MD  sevelamer carbonate (RENVELA) 800 MG tablet Take 800 mg by mouth 3 (three) times daily with meals.    [provider]     (Not in a hospital admission) Family History  Problem Relation Age of Onset  . Hyperlipidemia Mother   . Asthma Daughter   .  Diabetes Maternal Grandmother   . Stroke Maternal Grandmother   . Kidney disease Maternal Grandfather   . Kidney disease Paternal Grandmother        ROS:    As stated in the HPI and negative for all other systems.  Physical Exam: Blood pressure (!) 189/111, pulse 91, temperature 98.3 F (36.8 C), temperature source Oral, resp. rate 15, height 5\' 8"  (1.727 m), weight 189 lb (85.7 kg), last menstrual period 07/17/2016, SpO2 100 %.  GENERAL:  Well appearing HEENT:  Pupils equal round and reactive, fundi not visualized, oral mucosa unremarkable NECK:  No jugular venous distention, waveform within normal limits, carotid upstroke brisk and symmetric, no bruits, no thyromegaly LYMPHATICS:  No cervical, inguinal adenopathy LUNGS:  Clear to auscultation bilaterally BACK:  No CVA tenderness CHEST:  Unremarkable HEART:  PMI not displaced or sustained,S1 and S2 within normal limits, no S3, no S4, no clicks, no rubs, no murmurs ABD:  Flat, positive bowel sounds normal in frequency in pitch, no bruits, no rebound, no guarding, no midline pulsatile mass, no hepatomegaly, no splenomegaly EXT:  2 plus pulses throughout, no edema, no cyanosis no clubbing, left arm fistula with thrill and bruit SKIN:  No rashes no nodules NEURO:  Cranial nerves II through XII grossly intact, motor grossly intact throughout PSYCH:  Cognitively intact, oriented to person place and time   Labs: Lab Results  Component Value Date   BUN 55 (H) 01/09/2017   Lab Results  Component Value Date   CREATININE 7.73 (H) 01/09/2017   Lab Results  Component Value Date   NA 132 (L) 01/09/2017   K 5.1 01/09/2017   CL 91 (L) 01/09/2017   CO2 27 01/09/2017    Lab Results  Component Value Date   WBC 7.1 01/09/2017   HGB 10.6 (L) 01/09/2017   HCT 32.4 (L) 01/09/2017   MCV 92.8 01/09/2017   PLT 261 01/09/2017    Lab Results  Component Value Date   ALT 13 (L) 01/09/2017   AST 14 (L) 01/09/2017   ALKPHOS 77 01/09/2017    BILITOT 0.8 01/09/2017    EKG:  Pending  ASSESSMENT AND PLAN:   RIGHT ATRIAL MASS:   Differential includes thrombus, endocarditis or line infection, myxoma, lipoma, angiosarcoma or extension of an abdominal tumor such has hepatoma.  TEE is indicated for further evaluation.  She has no constitutional complaints that would suggest malignancy or infection. There is alos no evidence of obstructive or embolic phenomenon.   Further indication for MRI will be based on the need following TEE.   We will tentatively plan the TEE for tomorrow.  Check an EKG.  SignedMinus Breeding 01/09/2017, 6:29 PM

## 2017-01-09 NOTE — ED Notes (Signed)
Pt provided with [place]

## 2017-01-09 NOTE — ED Triage Notes (Signed)
PT sent here after 2D Echo showing a clot near the tricuspid valve, sent here for admission and blood thinners.  She has been experiencing sob with climbing stairs since starting HD in May 2018.  Pt denies chest pain.

## 2017-01-09 NOTE — ED Provider Notes (Signed)
[facility] DEPT Provider Note   CSN: [id number] Arrival date & time: 01/09/17  1221     History   Chief Complaint Chief Complaint  Patient presents with  . Shortness of Breath    Pt has clot in heart    HPI Carrie [name] is a 36 y.o. female.  HPI   Patient is here from [facility] for further evaluation of a 2x 1 cm echodensity, oval filamentous mass in her right atrium found today. She was getting an echocardiogram at [facility] as part of the pre-transplant evaluation for chronic kidney disease from complication of her diabetes. [facility] wanted to admit her to the hospital there for TEE and Cardiac MRI but the patient declined because her daughter lives and goes to school here in [place]. Her daughter will need to stay at the hospital with the patient when she isn't at school.   The patient gets dialysis on MWF at [facility]. She started dialysis earlier this year and currently is using a port in her right chest. She has a fistula but it is not usable yet because she needs a graft. She is currently having no symptoms and is otherwise stable.   01/09/2017:       Normal EF 55-60%  Past Medical History:  Diagnosis Date  . Diabetes mellitus without complication ([place])   . Diabetic neuropathy ([place])   . Gastroparesis   . HA (headache)   . Hemodialysis access site with arteriovenous graft ([place])   . Hypertension   . Neuropathy   . Renal disorder   . Trigeminal neuralgia     Patient Active Problem List   Diagnosis Date Noted  . Pregnancy test positive 12/22/2016  . Amenorrhea 12/12/2016  . Diabetes ([place]) 09/16/2016  . ESRD on hemodialysis ([place])   . Gastroparesis   . Intractable vomiting 09/04/2016  . 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 Intractable vomiting with nausea 04/04/2015  . AKI (acute kidney injury) ([place]) 04/04/2015  . DKA (diabetic ketoacidoses) ([place]) 04/03/2015  . Trigeminal neuralgia 12/12/2013  . HA (headache)   . DKA, type 2 ([place])  10/24/2012  . Contact dermatitis 10/24/2012  . SORE THROAT 03/22/2010  . 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 FATIGUE 05/25/2009  . VAGINAL DISCHARGE 01/28/2009  . Carbuncle and furuncle of unspecified site 12/29/2008  . HYPERCHOLESTEROLEMIA 06/12/2008  . MICROALBUMINURIA 06/12/2008  . MONILIAL VAGINITIS 11/14/2007  . INGUINAL PAIN, BILATERAL 11/14/2007  . DYSURIA 10/17/2007  . CYSTITIS 09/05/2007  . DELAYED MENSES 09/05/2007  . RHINOCONJUNCTIVITIS, ALLERGIC 07/19/2007  . DENTAL PAIN 03/28/2007  . HYPERLIPIDEMIA 09/01/2006  . PELVIC INFLAMMATORY DISEASE 09/01/2006  . ABORTION, SPONTANEOUS 09/01/2006  . ABSCESS 09/01/2006  . HX, PERSONAL, PAST NONCOMPLIANCE 09/01/2006    Past Surgical History:  Procedure Laterality Date  . AV FISTULA PLACEMENT Left 09/07/2016   Procedure: Left arm 1st Stage Basilic AV Fistula;  Surgeon: [name], MD;  Location[facility] OR;  Service: Vascular;  Laterality: Left;  . EYE SURGERY    . INSERTION OF DIALYSIS CATHETER Right 09/07/2016   Procedure: INSERTION OF DIALYSIS CATHETER;  Surgeon: [name], MD;  Location: [place];  Service: Vascular;  Laterality: Right;  . None      OB History    Gravida Para Term Preterm AB Living   7 1   1 5 1    SAB TAB Ectopic Multiple Live Births   2 3  Home Medications    Prior to Admission medications   Medication Sig Start Date End Date Taking? Authorizing Provider  acetaminophen (TYLENOL) 325 MG tablet Take 650 mg by mouth every 6 (six) hours as needed for mild pain.    [provider]  carbamazepine (TEGRETOL XR) 400 MG 12 hr tablet Take 200 mg by mouth once.    [provider]  gabapentin (NEURONTIN) 300 MG capsule Take 1 capsule (300 mg total) by mouth at bedtime. 09/12/16   Rosita Fire, MD  insulin aspart (NOVOLOG FLEXPEN) 100 UNIT/ML FlexPen Inject 5 Units into the skin 3 (three) times daily with meals. And pen needles 4/day 12/22/16   Renato Shin, MD  Insulin Glargine  (LANTUS SOLOSTAR) 100 UNIT/ML Solostar Pen Inject 10 Units into the skin daily at 10 pm. 12/22/16   Renato Shin, MD  Insulin Pen Needle 32G X 4 MM MISC Use to inject insulin 1 time per day. 10/27/16   Renato Shin, MD  labetalol (NORMODYNE) 300 MG tablet Take 300 mg by mouth 3 (three) times daily. 08/25/16   [provider]  Metoclopramide HCl (REGLAN PO) Take by mouth.    [provider]  ondansetron (ZOFRAN) 4 MG tablet Take 1 tablet (4 mg total) by mouth every 8 (eight) hours as needed for nausea or vomiting. 09/12/16   Rosita Fire, MD  promethazine (PHENERGAN) 25 MG tablet Take 1 tablet (25 mg total) by mouth every 8 (eight) hours as needed for nausea or vomiting. 09/12/16   Rosita Fire, MD  sevelamer carbonate (RENVELA) 800 MG tablet Take 800 mg by mouth 3 (three) times daily with meals.    [provider]    Family History Family History  Problem Relation Age of Onset  . Hyperlipidemia Mother   . Asthma Daughter   . Diabetes Maternal Grandmother   . Kidney disease Maternal Grandfather   . Kidney disease Paternal Grandmother     Social History Social History  Substance Use Topics  . Smoking status: Never Smoker  . Smokeless tobacco: Never Used  . Alcohol use No     Comment: former use     Allergies   No known allergies   Review of Systems Review of Systems  Negative ROS aside from pertinent positives and negatives as listed in HPI  Physical Exam Updated Vital Signs BP (!) 170/98 (BP Location: Right Arm)   Pulse 91   Temp 98.3 F (36.8 C) (Oral)   Resp 20   Ht 5\' 8"  (1.727 m)   Wt 85.7 kg (189 lb)   LMP 07/17/2016 (Within Weeks) Comment: Has not had a period since starting dialysis in May  SpO2 100%   BMI 28.74 kg/m   Physical Exam  Constitutional: She appears well-developed and well-nourished.  HENT:  Head: Normocephalic and atraumatic.  Eyes: Pupils are equal, round, and reactive to light. Conjunctivae are  normal.  Neck: Trachea normal, normal range of motion and full passive range of motion without pain. Neck supple.  Cardiovascular: Normal rate, regular rhythm and normal pulses.   Pulmonary/Chest: Effort normal and breath sounds normal. Chest wall is not dull to percussion. She exhibits no tenderness, no crepitus, no edema, no deformity and no retraction.  Abdominal: Soft. Normal appearance and bowel sounds are normal.  Musculoskeletal: Normal range of motion.  Neurological: She is alert. She has normal strength.  Skin: Skin is warm, dry and intact.  Psychiatric: She has a normal mood and affect. Her speech is  normal and behavior is normal. Judgment and thought content normal. Cognition and memory are normal.     ED Treatments / Results  Labs (all labs ordered are listed, but only abnormal results are displayed) Labs Reviewed  CBC WITH DIFFERENTIAL/PLATELET  COMPREHENSIVE METABOLIC PANEL  PROTIME-INR    EKG  EKG Interpretation None       Radiology No results found.  Procedures Procedures (including critical care time)  Medications Ordered in ED Medications - No data to display   Initial Impression / Assessment and Plan / ED Course  I have reviewed the triage vital signs and the nursing notes.  Pertinent labs & imaging results that were available during my care of the patient were reviewed by me and considered in my medical decision making (see chart for details).     Will obtain CBC, CMP and pt/INR.  Echocardiogram is in [place] from today's visit.  Labs reassuring, patient admitted to Internal Medicine Residence Service.  Final Clinical Impressions(s) / ED Diagnoses   Final diagnoses:  Right atrial mass  Hypertension, unspecified type  ESRD on hemodialysis ([place])  Type 2 diabetes mellitus with chronic kidney disease on chronic dialysis, with long-term current use of insulin [facility])    New Prescriptions New Prescriptions   No medications on file       Delos Haring, PA-C 01/09/17 [place], [place], MD 01/11/17 [phone]

## 2017-01-09 NOTE — H&P (Signed)
Date: 01/09/2017               Patient Name:  Carrie [name] MRN: [id number]  DOB: 1980/08/17 Age / Sex: 36 y.o., female   PCP: Glendon Axe, MD         Medical Service: Internal Medicine Teaching Service         Attending Physician: Dr. Bartholomew Crews, MD    First Contact: Dr. Lurlean Horns Pager: [phone]  Second Contact: Dr. Tiburcio Pea Pager: [phone]       After Hours (After 5p/  First Contact Pager: [phone]  weekends / holidays): Second Contact Pager: [phone]   Chief Complaint: Right atrial mass   History of Present Illness:  Carrie [name] a 36 year old female with history of ESRD 2/2 DM and HTN on HD through HD cath, uncontrolled N0NL complicated by peripheral neuropathy and gastroparesis, HTN, and trigeminal neuralgia who presents to the ED for evaluation of right atrial mass. Patient is being evaluated at [facility] for renal transplant and was found to have a right atrial mass during echo today. Declined admission at [facility] and wanted to be evaluated Carrie [name]. Patient reports intermittent palpitations that resolved spontaneously within seconds. She also reports she feels her heart skips a beat at night. States that she has been experiencing shortness of breath recently when going up the stairs. No chest pain. Has been feeling like her normal self otherwise. Denies fevers, chills, abdominal pain, and lower extremity swelling.  ED course: Patient was hemodynamically stable on arrival. She was hypertensive with BP of 190/117. Lab work was unremarkable. She received zofran for nausea and IV morphine x1.    Review of Systems: A complete ROS was negative except as per HPI.    Meds:  Carbamazepine 200 mg QHS Gabapentin 300 mg QHS  Lantus 30U in AM  Labetalol 300 mg BID Reglan 5 mg TID Zofran 4 mg TID Phenergan 25 mg TID    Allergies: Allergies as of 01/09/2017 - Review Complete 01/09/2017  Allergen Reaction Noted  . No known allergies  09/06/2016    Past Medical History:  Diagnosis Date  . Diabetes mellitus without complication ([place])   . Diabetic neuropathy ([place])   . Gastroparesis   . HA (headache)   . Hemodialysis access site with arteriovenous graft ([place])   . Hypertension   . Neuropathy   . Renal disorder   . Trigeminal neuralgia     Family History:  Family History  Problem Relation Age of Onset  . Hyperlipidemia Mother   . Asthma Daughter   . Diabetes Maternal Grandmother   . Kidney disease Maternal Grandfather   . Kidney disease Paternal Grandmother     Social History:  Patient lives at home with father and daughter. Unemployed. Denies tobacco and drug use. States she stopped drinking about one year ago.   Physical Exam: Blood pressure (!) 189/111, pulse 91, temperature 98.3 F (36.8 C), temperature source Oral, resp. rate 15, height 5\' 8"  (1.727 m), weight 189 lb (85.7 kg), last menstrual period 07/17/2016, SpO2 100 %.  General: pleasant female, well-nourished, well-developed, in no acute distress Cardiac: regular rate and rhythm, nl S1/S2, no murmurs, rubs or gallops but turbulent flow best heard over lower left sternal border. Pulm: CTAB, no wheezes or crackles, no increased work of breathing, HD catheter noted is on R upper chest when no associated erythema or warmth Abd: soft, NTND, normoactive bowel sounds Neuro: A&Ox3, able to move all 4 extremities, no  focal deficits Ext: warm and well perfused, no peripheral edema, 2+ DP pulses bilaterally    EKG: not obtained, ordered   CXR: not obtained  Assessment & Plan by Problem:  Carrie Hong. Kinzler a 36 year old female with history of ESRD 2/2 DM and HTN on HD through HD cath, uncontrolled V7IX complicated by peripheral neuropathy and gastroparesis, HTN, and trigeminal neuralgia who presents for evaluation of right atrial mass found during workup for renal transplant.   # Right atrial mass: No known cardiac history. Found during workup for renal transplant.  Patient complains of recent palpitations and dyspnea on exertion. No signs/symptoms of R heart failure or systemic infection. DDx: myxoma, vegetation from IE, sarcoma, angiosarcoma. Cardiology following. - Cardiology consulted, appreciate recommendations  - Plan for TEE tomorrow. Cardiac MRI pending TEE results.   # ESRD: secondary to T1DM and HTN, on HD MWF. HD cath placed on 08/2016.  - Nephrology following, plan for HD tomorrow   # T1DM: uncontrolled. States she is only on Lantus at home. Complicated by peripheral neuropathy and gastroparesis. - SQ Lantus 15U + SSI-S - Continue home gabapentin 300 MG QHS - Continue home antiemetics  - F/u EKG to assess QTc   # HTN: BP elevated on arrival with sBP 150-190s - Continue home labetalol 300 mg BID   # Trigeminal neuralgia:  - Continue home carbamazepine 200 mg QHS   F: none E: will continue to monitor  N: NPO at MN-->Renal and CM diet   VTE ppx: SQ heparin   Code status: Full code, confirmed on admission   Dispo: Admit patient to Observation with expected length of stay less than 2 midnights.  Signed: Welford Roche, MD  Internal Medicine PGY-1  P [phone]

## 2017-01-09 NOTE — Consult Note (Signed)
[facility] Renal Consultation Note    Indication for Consultation:  Management of ESRD/hemodialysis; anemia, hypertension/volume and secondary hyperparathyroidism PCP:  HPI: Carrie [name] is a 36 y.o. female. 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  Past medical history significant for type 1 DM, complicated by neuropathy and severe gastroparesis, and trigeminal neuralgia.  Patient has been mostly compliant with prescribed HD, occasionally missing and signing off early.  Her last HD was on 9/24, she completed the full treatment, without complications. Of note, she missed initial postop follow up of her 1st stage basilic vein fistula placed on 09/07/16 by Dr. Trula Slade, and was seen in office on 9/18. They concluded AVF to be marginal on duplex and are to schedule 2nd stage superficialization or possible insertion of AVG.    Ms. Carrie [name] presented to the ED following TTE at [facility] for routine pre transplant evaluation, that revealed "large R atrial echodensity, 2x1cm oval mass with filamentous components, in close proximity to catheter but does not appear to be attached and close proximity to mitral valve."  WF wanted to admit patient for TEE and Cardiac MRI but she refused, and chose to return to [facility] because her child is here.  She admits to dyspnea with exertion only on dialysis days following treatment, and fever one day last week, that resolved with tylenol.  Denies chest pain, current SOB/dyspnea, cough, hemoptysis, n/v/d, chills, edema, weakness and dizziness.   Workup completed so far in the ED is unremarkable, except for hyperglycemia, and hypertension.  Patient to be admitted for further evaluation of R atrial mass.    Past Medical History:  Diagnosis Date  . Diabetes mellitus without complication ([place])   . Diabetic neuropathy ([place])   . Gastroparesis   . HA (headache)   . Hemodialysis access site with arteriovenous graft ([place])   . Hypertension   .  Neuropathy   . Renal disorder   . Trigeminal neuralgia    Past Surgical History:  Procedure Laterality Date  . AV FISTULA PLACEMENT Left 09/07/2016   Procedure: Left arm 1st Stage Basilic AV Fistula;  Surgeon: [name], MD;  Location: [facility] OR;  Service: Vascular;  Laterality: Left;  . EYE SURGERY    . INSERTION OF DIALYSIS CATHETER Right 09/07/2016   Procedure: INSERTION OF DIALYSIS CATHETER;  Surgeon: [name], MD;  Location: [place];  Service: Vascular;  Laterality: Right;  . None     Family History  Problem Relation Age of Onset  . Hyperlipidemia Mother   . Asthma Daughter   . Diabetes Maternal Grandmother   . Kidney disease Maternal Grandfather   . Kidney disease Paternal Grandmother    Social History:  reports that she has never smoked. She has never used smokeless tobacco. She reports that she does not drink alcohol or use drugs. Allergies  Allergen Reactions  . No Known Allergies    Prior to Admission medications   Medication Sig Start Date End Date Taking? Authorizing Provider  acetaminophen (TYLENOL) 325 MG tablet Take 650 mg by mouth every 6 (six) hours as needed for mild pain.    [provider]  carbamazepine (TEGRETOL XR) 400 MG 12 hr tablet Take 200 mg by mouth once.    [provider]  gabapentin (NEURONTIN) 300 MG capsule Take 1 capsule (300 mg total) by mouth at bedtime. 09/12/16   Rosita Fire, MD  insulin aspart (NOVOLOG FLEXPEN) 100 UNIT/ML FlexPen Inject 5  Units into the skin 3 (three) times daily with meals. And pen needles 4/day 12/22/16   Renato Shin, MD  Insulin Glargine (LANTUS SOLOSTAR) 100 UNIT/ML Solostar Pen Inject 10 Units into the skin daily at 10 pm. 12/22/16   Renato Shin, MD  Insulin Pen Needle 32G X 4 MM MISC Use to inject insulin 1 time per day. 10/27/16   Renato Shin, MD  labetalol (NORMODYNE) 300 MG tablet Take 300 mg by mouth 3 (three) times daily. 08/25/16   [provider]  Metoclopramide HCl  (REGLAN PO) Take by mouth.    [provider]  ondansetron (ZOFRAN) 4 MG tablet Take 1 tablet (4 mg total) by mouth every 8 (eight) hours as needed for nausea or vomiting. 09/12/16   Rosita Fire, MD  promethazine (PHENERGAN) 25 MG tablet Take 1 tablet (25 mg total) by mouth every 8 (eight) hours as needed for nausea or vomiting. 09/12/16   Rosita Fire, MD  sevelamer carbonate (RENVELA) 800 MG tablet Take 800 mg by mouth 3 (three) times daily with meals.    [provider]   Current Facility-Administered Medications  Medication Dose Route Frequency Provider Last Rate Last Dose  . morphine 4 MG/ML injection 4 mg  4 mg Intravenous Once Greene, Tiffany, PA-C      . ondansetron [facility]) injection 4 mg  4 mg Intravenous Once Greene, Tiffany, PA-C      . sodium chloride 0.9 % bolus 500 mL  500 mL Intravenous Once Delos Haring, PA-C       Current Outpatient Prescriptions  Medication Sig Dispense Refill  . acetaminophen (TYLENOL) 325 MG tablet Take 650 mg by mouth every 6 (six) hours as needed for mild pain.    . carbamazepine (TEGRETOL XR) 400 MG 12 hr tablet Take 200 mg by mouth once.    . gabapentin (NEURONTIN) 300 MG capsule Take 1 capsule (300 mg total) by mouth at bedtime.    . insulin aspart (NOVOLOG FLEXPEN) 100 UNIT/ML FlexPen Inject 5 Units into the skin 3 (three) times daily with meals. And pen needles 4/day 15 mL 11  . Insulin Glargine (LANTUS SOLOSTAR) 100 UNIT/ML Solostar Pen Inject 10 Units into the skin daily at 10 pm. 5 pen PRN  . Insulin Pen Needle 32G X 4 MM MISC Use to inject insulin 1 time per day. 100 each 2  . labetalol (NORMODYNE) 300 MG tablet Take 300 mg by mouth 3 (three) times daily.  0  . Metoclopramide HCl (REGLAN PO) Take by mouth.    . ondansetron (ZOFRAN) 4 MG tablet Take 1 tablet (4 mg total) by mouth every 8 (eight) hours as needed for nausea or vomiting. 20 tablet 0  . promethazine (PHENERGAN) 25 MG tablet Take 1 tablet (25 mg  total) by mouth every 8 (eight) hours as needed for nausea or vomiting. 10 tablet 0  . sevelamer carbonate (RENVELA) 800 MG tablet Take 800 mg by mouth 3 (three) times daily with meals.     Labs: Basic Metabolic Panel:  Recent Labs Lab 01/09/17 1409  NA 132*  K 5.1  CL 91*  CO2 27  GLUCOSE 309*  BUN 55*  CREATININE 7.73*  CALCIUM 8.7*   Liver Function Tests:  Recent Labs Lab 01/09/17 1409  AST 14*  ALT 13*  ALKPHOS 77  BILITOT 0.8  PROT 7.1  ALBUMIN 3.5   No results for input(s): LIPASE, AMYLASE in the last 168 hours. No results for input(s): AMMONIA in the last  168 hours. CBC:  Recent Labs Lab 01/09/17 1409  WBC 7.1  NEUTROABS 4.8  HGB 10.6*  HCT 32.4*  MCV 92.8  PLT 261   Cardiac Enzymes: No results for input(s): CKTOTAL, CKMB, CKMBINDEX, TROPONINI in the last 168 hours. CBG: No results for input(s): GLUCAP in the last 168 hours. Iron Studies: No results for input(s): IRON, TIBC, TRANSFERRIN, FERRITIN in the last 72 hours. Studies/Results: No results found.  ROS: As per HPI otherwise negative.  Physical Exam: Vitals:   01/09/17 1229 01/09/17 1334  BP: (!) 150/91 (!) 170/98  Pulse: 92 91  Resp: 18 20  Temp: 98.3 F (36.8 C)   TempSrc: Oral   SpO2: 100% 100%  Weight: 85.7 kg (189 lb)   Height: 5\' 8"  (1.727 m)      General: WDWN NAD Head: NCAT sclera not icteric MMM Neck: Supple.  Lungs: CTA bilaterally without wheezes, rales, or rhonchi. Breathing is unlabored. Heart: RRR with S1 S2.  Abdomen: soft, NT, + BS Lower extremities:without edema or ischemic changes, no open wounds  Neuro: A & O  X 3. Moves all extremities spontaneously. Psych:  Responds to questions appropriately with a normal affect. Dialysis Access: TDC, LU AVF +thrill, +bruit  Dialysis Orders: MWF -[facility]  4hrs BFR 400, DFR 800, EDW 84kg, 2K/2Ca TDC, L AVF maturing  Heparin 2500U IV bolus Calcitriol 0.69mcg PO qHD  Last labs: 9/19: hgb 11.1, 9/12: Ca 9.8, P 5.7, 8/22:  TSAT 31%, Alb 2.8, PTH 282  Assessment/Plan: 1.  R atrial echodensity - thrombus vs mass  -identified on TTE performed at [facility]. Needs TEE   -further workup and management pending 2.  ESRD - MWF  -Inpatient HD tomorrow, orders written, she has not been reaching her EDW recently, believes she has gained weight, will need to determine new EDW while here. Standing pre and post weights.   -CBC and RFP pre HD 3.  Hypertension/volume    -BP elevated today. Continue outpatient BP meds - labetalol  -Does not appear volume overloaded, titrate down volume as needed.  4.  Anemia  - Hgb stable, 10.6. Follow trend.  5.  Metabolic bone disease -  -Ca slightly low here, follow. Last P close to goal. Continue TUMS. Lat PTH within goal, continue calcitriol 0.43mcg qHD.   6.  Nutrition - Nepro TID, Renavite, and Renal diet.  7. Type 1 DM - on insulin, managed by PCP  [name], PA-C 01/09/2017, 3:05 PM   I have seen and examined this patient and agree with plan and assessment in the above note with renal recommendations/intervention highlighted.  She reports that she did have a fever at dialysis approximately 2 weeks ago and thinks that blood cultures were drawn, however she was only given tylenol and doesn't know the results of the cultures.  She also reports dropping her BP with HD and not making her edw.  She is nervous about dialysis due to these drops in BP.  She denies any chills, hemoptysis, chest pain, but admits to palpitations with HD at times.  Otherwise she feels well and has not had any unexpected weight loss.  Her AVf is 23 months old however it is not mature due to small caliber of her veins and is to have an AVG placed by [facility] at some time and asks that it be done while she is an inpatient.  We discussed the need to evaluate the atrial mass before proceeding with any AVG placement.  Broadus John A Yelena Metzer,MD 01/09/2017 4:21  PM

## 2017-01-10 ENCOUNTER — Encounter (HOSPITAL_COMMUNITY)
Admission: EM | Disposition: A | Discharge status: Home / Self Care | Payer: Self-pay | Source: Home / Self Care | Attending: Internal Medicine

## 2017-01-10 DIAGNOSIS — G500 Trigeminal neuralgia: Secondary | ICD-10-CM

## 2017-01-10 DIAGNOSIS — I5189 Other ill-defined heart diseases: Secondary | ICD-10-CM | POA: Diagnosis not present

## 2017-01-10 DIAGNOSIS — E10649 Type 1 diabetes mellitus with hypoglycemia without coma: Secondary | ICD-10-CM

## 2017-01-10 DIAGNOSIS — I120 Hypertensive chronic kidney disease with stage 5 chronic kidney disease or end stage renal disease: Secondary | ICD-10-CM | POA: Diagnosis not present

## 2017-01-10 DIAGNOSIS — K3184 Gastroparesis: Secondary | ICD-10-CM | POA: Diagnosis not present

## 2017-01-10 DIAGNOSIS — Z79899 Other long term (current) drug therapy: Secondary | ICD-10-CM | POA: Diagnosis not present

## 2017-01-10 DIAGNOSIS — E1043 Type 1 diabetes mellitus with diabetic autonomic (poly)neuropathy: Secondary | ICD-10-CM

## 2017-01-10 DIAGNOSIS — N186 End stage renal disease: Secondary | ICD-10-CM

## 2017-01-10 DIAGNOSIS — E1022 Type 1 diabetes mellitus with diabetic chronic kidney disease: Secondary | ICD-10-CM | POA: Diagnosis not present

## 2017-01-10 DIAGNOSIS — Z992 Dependence on renal dialysis: Secondary | ICD-10-CM

## 2017-01-10 DIAGNOSIS — E1042 Type 1 diabetes mellitus with diabetic polyneuropathy: Secondary | ICD-10-CM

## 2017-01-10 DIAGNOSIS — I519 Heart disease, unspecified: Secondary | ICD-10-CM | POA: Diagnosis not present

## 2017-01-10 LAB — COMPREHENSIVE METABOLIC PANEL
ALT: 10 U/L — ABNORMAL LOW (ref 14–54)
AST: 12 U/L — AB (ref 15–41)
Albumin: 3.1 g/dL — ABNORMAL LOW (ref 3.5–5.0)
Alkaline Phosphatase: 68 U/L (ref 38–126)
Anion gap: 13 (ref 5–15)
BUN: 60 mg/dL — AB (ref 6–20)
CHLORIDE: 96 mmol/L — AB (ref 101–111)
CO2: 26 mmol/L (ref 22–32)
Calcium: 8.7 mg/dL — ABNORMAL LOW (ref 8.9–10.3)
Creatinine, Ser: 8.58 mg/dL — ABNORMAL HIGH (ref 0.44–1.00)
GFR calc Af Amer: 6 mL/min — ABNORMAL LOW (ref >60)
GFR, EST NON AFRICAN AMERICAN: 5 mL/min — AB (ref >60)
GLUCOSE: 96 mg/dL (ref 65–99)
Potassium: 4.2 mmol/L (ref 3.5–5.1)
Sodium: 135 mmol/L (ref 135–145)
Total Bilirubin: 0.6 mg/dL (ref 0.3–1.2)
Total Protein: 6.4 g/dL — ABNORMAL LOW (ref 6.5–8.1)

## 2017-01-10 LAB — GLUCOSE, CAPILLARY
GLUCOSE-CAPILLARY: 143 mg/dL — AB (ref 65–99)
GLUCOSE-CAPILLARY: 208 mg/dL — AB (ref 65–99)
Glucose-Capillary: 37 mg/dL — CL (ref 65–99)
Glucose-Capillary: 128 mg/dL — ABNORMAL HIGH (ref 65–99)
Glucose-Capillary: 164 mg/dL — ABNORMAL HIGH (ref 65–99)
Glucose-Capillary: 181 mg/dL — ABNORMAL HIGH (ref 65–99)
Glucose-Capillary: 200 mg/dL — ABNORMAL HIGH (ref 65–99)

## 2017-01-10 LAB — CBC
HEMATOCRIT: 29.7 % — AB (ref 36.0–46.0)
Hemoglobin: 9.7 g/dL — ABNORMAL LOW (ref 12.0–15.0)
MCH: 30.2 pg (ref 26.0–34.0)
MCHC: 32.7 g/dL (ref 30.0–36.0)
MCV: 92.5 fL (ref 78.0–100.0)
PLATELETS: 234 10*3/uL (ref 150–400)
RBC: 3.21 MIL/uL — AB (ref 3.87–5.11)
RDW: 16.5 % — ABNORMAL HIGH (ref 11.5–15.5)
WBC: 5.9 10*3/uL (ref 4.0–10.5)

## 2017-01-10 LAB — MRSA PCR SCREENING: MRSA by PCR: INVALID — AB

## 2017-01-10 SURGERY — CANCELLED PROCEDURE

## 2017-01-10 MED ORDER — DEXTROSE 50 % IV SOLN
INTRAVENOUS | Status: AC
  Administered 2017-01-10: 25 mL
  Filled 2017-01-10: qty 50

## 2017-01-10 MED ORDER — METOCLOPRAMIDE HCL 5 MG/ML IJ SOLN
5.0000 mg | Freq: Once | INTRAMUSCULAR | Status: AC
  Administered 2017-01-10: 5 mg via INTRAVENOUS
  Filled 2017-01-10: qty 2

## 2017-01-10 MED ORDER — LIDOCAINE HCL (PF) 1 % IJ SOLN: 5.0000 mL | INTRAMUSCULAR | Status: DC | PRN

## 2017-01-10 MED ORDER — PROMETHAZINE HCL 25 MG/ML IJ SOLN
INTRAMUSCULAR | Status: AC
  Filled 2017-01-10: qty 1

## 2017-01-10 MED ORDER — CALCITRIOL 0.25 MCG PO CAPS
ORAL_CAPSULE | ORAL | Status: AC
  Filled 2017-01-10: qty 1

## 2017-01-10 MED ORDER — INSULIN GLARGINE 100 UNIT/ML ~~LOC~~ SOLN
10.0000 [IU] | Freq: Every day | SUBCUTANEOUS | Status: DC
  Administered 2017-01-10 – 2017-01-23 (×12): 10 [IU] via SUBCUTANEOUS
  Filled 2017-01-10 (×15): qty 0.1

## 2017-01-10 MED ORDER — ALTEPLASE 2 MG IJ SOLR: 2.0000 mg | Freq: Once | INTRAMUSCULAR | Status: DC | PRN

## 2017-01-10 MED ORDER — ONDANSETRON HCL 4 MG/2ML IJ SOLN
4.0000 mg | Freq: Once | INTRAMUSCULAR | Status: AC
  Administered 2017-01-10: 4 mg via INTRAVENOUS
  Filled 2017-01-10: qty 2

## 2017-01-10 MED ORDER — SODIUM CHLORIDE 0.9 % IV SOLN: 100.0000 mL | INTRAVENOUS | Status: DC | PRN

## 2017-01-10 MED ORDER — ONDANSETRON HCL 4 MG/2ML IJ SOLN: 4.0000 mg | Freq: Once | INTRAMUSCULAR | Status: DC

## 2017-01-10 MED ORDER — PENTAFLUOROPROP-TETRAFLUOROETH EX AERO: 1.0000 "application " | INHALATION_SPRAY | CUTANEOUS | Status: DC | PRN

## 2017-01-10 MED ORDER — LIDOCAINE-PRILOCAINE 2.5-2.5 % EX CREA: 1.0000 "application " | TOPICAL_CREAM | CUTANEOUS | Status: DC | PRN

## 2017-01-10 MED ORDER — INSULIN ASPART 100 UNIT/ML ~~LOC~~ SOLN
0.0000 [IU] | Freq: Three times a day (TID) | SUBCUTANEOUS | Status: DC
  Administered 2017-01-10 (×2): 2 [IU] via SUBCUTANEOUS
  Administered 2017-01-11: 3 [IU] via SUBCUTANEOUS
  Administered 2017-01-11: 7 [IU] via SUBCUTANEOUS

## 2017-01-10 MED ORDER — HEPARIN SODIUM (PORCINE) 1000 UNIT/ML DIALYSIS: 1000.0000 [IU] | INTRAMUSCULAR | Status: DC | PRN

## 2017-01-10 MED ORDER — PROMETHAZINE HCL 25 MG/ML IJ SOLN
12.5000 mg | Freq: Once | INTRAMUSCULAR | Status: AC
  Administered 2017-01-10: 12.5 mg via INTRAVENOUS
  Filled 2017-01-10: qty 1

## 2017-01-10 NOTE — Care Management Note (Signed)
Case Management Note  Patient Details  Name: Carrie [name] MRN: [id number] Date of Birth: [birth year]  Subjective/Objective:                 Patient admitted from home lives with father and teenage son. Admitted with diabetic gastroparesis, right atrial mass, Hx ESRD on HD. TEE pending patient's improvement with N/V.    Action/Plan:  CM will continue to follow.  Expected Discharge Date:                  Expected Discharge Plan:  Home/Self Care  In-House Referral:     Discharge planning Services  CM Consult  Post Acute Care Choice:    Choice offered to:     DME Arranged:    DME Agency:     HH Arranged:    HH Agency:     Status of Service:  In process, will continue to follow  If discussed at Long Length of Stay Meetings, dates discussed:    Additional Comments:  Carles Collet, RN 01/10/2017, 3:52 PM

## 2017-01-10 NOTE — Progress Notes (Signed)
Progress Note  Patient Name: Carrie [name] Date of Encounter: 01/10/2017  Primary Cardiologist: New  Subjective   She was nauseated and throwing up during dialysis so the TEE was cancelled.   Inpatient Medications    Scheduled Meds: . calcitRIOL  0.25 mcg Oral Q M,W,F-HD  . calcium carbonate  1 tablet Oral TID WC  . carbamazepine  200 mg Oral QHS  . gabapentin  300 mg Oral QHS  . heparin  5,000 Units Subcutaneous Q8H  . insulin aspart  0-9 Units Subcutaneous TID WC  . insulin glargine  10 Units Subcutaneous Daily  . labetalol  300 mg Oral BID  . metoCLOPramide  5 mg Oral TID AC  . ramelteon  8 mg Oral QHS  . sevelamer carbonate  800 mg Oral TID WC  . sodium chloride flush  3 mL Intravenous Q12H   Continuous Infusions: . sodium chloride     PRN Meds: acetaminophen **OR** acetaminophen, promethazine, senna-docusate   Vital Signs    Vitals:   01/09/17 1800 01/09/17 2000 01/09/17 2040 01/10/17 0501  BP: (!) 168/91 (!) 158/90 (!) 174/107 (!) 164/97  Pulse: 90 88  88  Resp: 13 (!) 26 20 18   Temp:   98.4 F (36.9 C) 98.5 F (36.9 C)  TempSrc:   Oral Oral  SpO2: 100% 100% 100% 100%  Weight:   188 lb 11.4 oz (85.6 kg)   Height:   5\' 8"  (1.727 m)     Intake/Output Summary (Last 24 hours) at 01/10/17 0817 Last data filed at 01/10/17 0645  Gross per 24 hour  Intake              120 ml  Output              200 ml  Net              -80 ml   Filed Weights   01/09/17 1229 01/09/17 2040  Weight: 189 lb (85.7 kg) 188 lb 11.4 oz (85.6 kg)    Telemetry    NA - Personally Reviewed  ECG    NA - Personally Reviewed  Physical Exam   GEN: No acute distress.   Neck: No  JVD Cardiac: RRR, very brief apical systolic murmurs, rubs, or gallops.  Respiratory: Clear to auscultation bilaterally. GI: Soft, nontender, non-distended  MS: No  edema; No deformity. Neuro:  Nonfocal  Psych: Normal affect   Labs    Chemistry Recent Labs Lab 01/09/17 1409 01/10/17 0233   NA 132* 135  K 5.1 4.2  CL 91* 96*  CO2 27 26  GLUCOSE 309* 96  BUN 55* 60*  CREATININE 7.73* 8.58*  CALCIUM 8.7* 8.7*  PROT 7.1 6.4*  ALBUMIN 3.5 3.1*  AST 14* 12*  ALT 13* 10*  ALKPHOS 77 68  BILITOT 0.8 0.6  GFRNONAA 6* 5*  GFRAA 7* 6*  ANIONGAP 14 13     Hematology Recent Labs Lab 01/09/17 1409 01/10/17 0233  WBC 7.1 5.9  RBC 3.49* 3.21*  HGB 10.6* 9.7*  HCT 32.4* 29.7*  MCV 92.8 92.5  MCH 30.4 30.2  MCHC 32.7 32.7  RDW 16.4* 16.5*  PLT 261 234    Cardiac EnzymesNo results for input(s): TROPONINI in the last 168 hours. No results for input(s): TROPIPOC in the last 168 hours.   BNPNo results for input(s): BNP, PROBNP in the last 168 hours.   DDimer No results for input(s): DDIMER in the last 168 hours.   Radiology  No results found.  Cardiac Studies   NA  Patient Profile     36 y.o. female without prior cardiac history who is admitted for evaluation of a right atrial mass.  This was found at [facility] when she had TTE as part of a renal transplant evaluation.    Assessment & Plan    RA mass:    Plan for TEE in the AM.    Signed, Minus Breeding, MD  01/10/2017, 8:17 AM

## 2017-01-10 NOTE — Progress Notes (Signed)
Endo tech to get patient and patient is actively vomiting. Dr. Oval Linsey notified and she would like to reschedule. Dr. Oval Linsey to make Dr. Rayann Heman aware.

## 2017-01-10 NOTE — Progress Notes (Signed)
  Date: 01/10/2017  Patient name: Carrie [name]  Medical record number: [id number]  Date of birth: [birth year]   I have seen and evaluated Carrie [name] and discussed their care with the Residency Team. Ms.Sangster is a 36 year old woman with poorly controlled diabetes I, hypertension, and resultant end-stage renal disease who is being evaluated at [facility] for a kidney transplant +/- pancreatic transplant. On the day of admission, she had a TTE which showed a large right atrial echodensity 2x1 cm oval mass with filamentous component in close proximity to her dialysis catheter but did not appear to be intact to the catheter. It was recommended that she get admitted for a TEE or cardiac MRI that the patient elected to leave and come to [facility] for admission.on admission she endorsed intermittent palpitation which lasted only seconds, shortness of breath when going up stairs. Otherwise she was asymptomatic. Cardiology has evaluated the patient and plans a TEE today.  This morning, she has yet to receive her ordered EKG. She has no complaints. She had hypoglycemia to 37 this morning at 5 AM. She was nothing by mouth overnight for her procedure. She was asymptomatic. She states that she takes 30 units of Lantus in the morning and 10 units in the evening. She states that she just started a new short acting insulin one day ago but that is not unusual for her to get hypoglycemia typically in the middle of the night. This insulin regimen and insulin change is different than what she told the admitting team yesterday.   T max 98.2 HR 88 RR 18 BP 164/88 98% RA  Seen in HD, no bedside access for PE  Prior A1C : 12 in Jan, 7.5 12/26/16  Assessment and Plan: I have seen and evaluated the patient as outlined above. I agree with the formulated Assessment and Plan as detailed in the residents' note, with the following changes:  Carrie [name] is a 36 year old woman with poorly controlled diabetes and hypertension which has  resulted in end-stage renal disease. She is being evaluated at [facility] for a kidney transplant with or without pancreatic transplant. As part of her evaluation, she had a TTE which showed a right atrial mass in close proximity to her dialysis catheter but not felt to be connected to the catheter. She has no signs or symptoms of a infectious endocarditis. TEE today will give a better visualization of the mass and need for further workup.  1. Right atrial mass - await TEE results and further cardiac recommendations  2. Poorly controlled type 1 diabetes - her most recent A1c in September was 7.5 likely reflecting both hyper and hypoglycemia. Her diabetes has been complicated by gastroparesis, neuropathy, and renal failure. It is quite concerning that she had a CBG of 37 and was asymptomatic. She is not able to reliably tell us her home insulin regimen. We are changing her sliding scale insulin sensitive and decreasing her Lantus to 10 units once a day. We will continue to follow her CBGs and adjust insulin as needed.  3. Hypertension - her blood pressure at her September 11 outpatient appointment was 118/64. She is on labetalol 300 3 times a day. Her blood pressure here has not been consistently well controlled. Currently she is on hemodialysis and we'll assess her blood pressure postdialysis.  Larey Dresser, MD 9/26/20181:27 PM

## 2017-01-10 NOTE — Progress Notes (Signed)
   Subjective:  No acute events overnight. Patient was on dialysis when seen this morning and reported no complaints. On further questioning this morning, patient states she takes Lantus 30U in AM and 10U QHS.Reports that she has been experiencing hypoglycemic episodes at home while on Novolog and was recently changed to a new injectable medication but unable to recall name. She states she has also had hypoglycemic episodes while on this medication, but has only been on it for 1 day (prior to admission). Of note, patient reported taking Lantus 30U qAM and another injectable medication on admission day. Denied hypoglycemic episodes and states her BG at home are always between 300-400.   Objective:  Vital signs in last 24 hours: Vitals:   01/09/17 1800 01/09/17 2000 01/09/17 2040 01/10/17 0501  BP: (!) 168/91 (!) 158/90 (!) 174/107 (!) 164/97  Pulse: 90 88  88  Resp: 13 (!) 26 20 18   Temp:   98.4 F (36.9 C) 98.5 F (36.9 C)  TempSrc:   Oral Oral  SpO2: 100% 100% 100% 100%  Weight:   188 lb 11.4 oz (85.6 kg)   Height:   5\' 8"  (1.727 m)    General: sleeping in bed in no acute distress  Cardiac: regular rate and rhythm, nl S1/S2, no murmurs, rubs or gallops  Pulm: CTAB, no wheezes or crackles, no increased work of breathing  Abd: soft, NTND, bowel sounds present  Neuro: A&Ox3, able to move all 4 extremities and no focal deficits noted  Ext: warm and well perfused, no peripheral edema   Assessment/Plan:  Eber Hong. Handyside a 36 year old female with history of ESRD 2/2 DM and HTN on HD through HD cath, uncontrolled Y1VC complicated by peripheral neuropathy and gastroparesis, HTN, and trigeminal neuralgia who presents for evaluation of right atrial mass found during workup for renal transplant.   # Right atrial mass: No known cardiac history. Found during workup for renal transplant. Patient complains of recent palpitations and dyspnea on exertion. No signs/symptoms of R heart failure or  systemic infection. DDx: myxoma, vegetation from IE, sarcoma, angios arcoma. Cardiology following. - Cardiology consulted, appreciate recommendations  - Plan for TEE tomorrow. Cardiac MRI pending TEE results.   # ESRD: secondary to T1DM and HTN, on HD MWF. HD cath placed on 08/2016.  - Nephrology following, HD today   # T1DM: uncontrolled, c/b peripheral neuropathy and gastroparesis.. States she is only on Lantus at home. However, stated a different regimen this AM when seen (see subjective for further details). She has an episode of hypoglycemia this AM with CBG down to 38 and Lantus dose decreased. We will continue home antiemetics as patient continues to report nausea. QTc 438ms.  - SQ Lantus 15U--> 10U qAM  - SSI-S - Continue home gabapentin 300 MG QHS - Continue home antiemetics    # HTN: BP elevated on arrival with sBP 150-190s - Continue home labetalol 300 mg BID   # Trigeminal neuralgia:  - Continue home carbamazepine 200 mg QHS   F: none E: will continue to monitor  N: NPO at MN-->Renal and CM diet   VTE ppx: SQ heparin   Code status: Full code, confirmed on admission   Dispo: Anticipated discharge in approximately 1-2 day(s) pending cardiac workup.   Welford Roche, MD  Internal Medicine PGY-1  P [phone]

## 2017-01-10 NOTE — Progress Notes (Signed)
Hypoglycemic Event  CBG: 37  Treatment: D50 IV 25 mL  Symptoms: Shaky  Follow-up CBG: Time:0533 CBG Result:128  Possible Reasons for Event: Medication regimen: novolog  Comments/MD notified:hypoglycemia protocol    Marcedes Tech Joselita

## 2017-01-10 NOTE — Procedures (Signed)
I was present at this dialysis session. I have reviewed the session itself and made appropriate changes.   Filed Weights   01/09/17 1229 01/09/17 2040  Weight: 85.7 kg (189 lb) 85.6 kg (188 lb 11.4 oz)     Recent Labs Lab 01/10/17 0233  NA 135  K 4.2  CL 96*  CO2 26  GLUCOSE 96  BUN 60*  CREATININE 8.58*  CALCIUM 8.7*     Recent Labs Lab 01/09/17 1409 01/10/17 0233  WBC 7.1 5.9  NEUTROABS 4.8  --   HGB 10.6* 9.7*  HCT 32.4* 29.7*  MCV 92.8 92.5  PLT 261 234    Scheduled Meds: . calcitRIOL  0.25 mcg Oral Q M,W,F-HD  . calcium carbonate  1 tablet Oral TID WC  . carbamazepine  200 mg Oral QHS  . gabapentin  300 mg Oral QHS  . heparin  5,000 Units Subcutaneous Q8H  . insulin aspart  0-9 Units Subcutaneous TID WC  . insulin glargine  10 Units Subcutaneous Daily  . labetalol  300 mg Oral BID  . metoCLOPramide  5 mg Oral TID AC  . ramelteon  8 mg Oral QHS  . sevelamer carbonate  800 mg Oral TID WC  . sodium chloride flush  3 mL Intravenous Q12H   Continuous Infusions: . sodium chloride    . sodium chloride    . sodium chloride     PRN Meds:.sodium chloride, sodium chloride, acetaminophen **OR** acetaminophen, alteplase, heparin, lidocaine (PF), lidocaine-prilocaine, pentafluoroprop-tetrafluoroeth, promethazine, senna-docusate    Dialysis Orders: [facility]WF -[facility]  4hrs BFR 400, DFR 800, EDW 84kg, 2K/2Ca TDC, L AVF maturing  Heparin 2500U IV bolus Calcitriol 0.28mcg PO qHD  Last labs: 9/19: hgb 11.1, 9/12: Ca 9.8, P 5.7, 8/22: TSAT 31%, Alb 2.8, PTH 282  Assessment/Plan 1. Right atrial echodense mass- she is awaiting TEE to further evaluate mass.  She did have a low grade fever of 99.2 last week but no blood cultures sent and no antibiotics given 2. ESRD- cont with HD qMWF 3. HTN/Vol- not making edw will increase to 84.5kg.  Donetta Potts,  MD 01/10/2017, 8:54 AM

## 2017-01-11 ENCOUNTER — Encounter (HOSPITAL_COMMUNITY): Payer: Self-pay

## 2017-01-11 ENCOUNTER — Observation Stay (HOSPITAL_COMMUNITY): Payer: BLUE CROSS/BLUE SHIELD

## 2017-01-11 ENCOUNTER — Encounter (HOSPITAL_COMMUNITY)
Admission: EM | Disposition: A | Discharge status: Home / Self Care | Payer: Self-pay | Source: Home / Self Care | Attending: Internal Medicine

## 2017-01-11 DIAGNOSIS — E119 Type 2 diabetes mellitus without complications: Secondary | ICD-10-CM

## 2017-01-11 DIAGNOSIS — I5189 Other ill-defined heart diseases: Secondary | ICD-10-CM | POA: Diagnosis not present

## 2017-01-11 DIAGNOSIS — I519 Heart disease, unspecified: Secondary | ICD-10-CM | POA: Diagnosis not present

## 2017-01-11 DIAGNOSIS — N185 Chronic kidney disease, stage 5: Secondary | ICD-10-CM

## 2017-01-11 HISTORY — PX: TEE WITHOUT CARDIOVERSION: SHX5443

## 2017-01-11 LAB — GLUCOSE, CAPILLARY
GLUCOSE-CAPILLARY: 158 mg/dL — AB (ref 65–99)
GLUCOSE-CAPILLARY: 159 mg/dL — AB (ref 65–99)
GLUCOSE-CAPILLARY: 230 mg/dL — AB (ref 65–99)
Glucose-Capillary: 218 mg/dL — ABNORMAL HIGH (ref 65–99)
Glucose-Capillary: 187 mg/dL — ABNORMAL HIGH (ref 65–99)
Glucose-Capillary: 331 mg/dL — ABNORMAL HIGH (ref 65–99)

## 2017-01-11 LAB — CBC
HCT: 32 % — ABNORMAL LOW (ref 36.0–46.0)
Hemoglobin: 10.5 g/dL — ABNORMAL LOW (ref 12.0–15.0)
MCH: 30 pg (ref 26.0–34.0)
MCHC: 32.8 g/dL (ref 30.0–36.0)
MCV: 91.4 fL (ref 78.0–100.0)
PLATELETS: 259 10*3/uL (ref 150–400)
RBC: 3.5 MIL/uL — AB (ref 3.87–5.11)
RDW: 16.4 % — AB (ref 11.5–15.5)
WBC: 5.6 10*3/uL (ref 4.0–10.5)

## 2017-01-11 LAB — MRSA CULTURE: Culture: NOT DETECTED

## 2017-01-11 LAB — RENAL FUNCTION PANEL
Albumin: 2.9 g/dL — ABNORMAL LOW (ref 3.5–5.0)
Anion gap: 16 — ABNORMAL HIGH (ref 5–15)
BUN: 41 mg/dL — AB (ref 6–20)
CALCIUM: 8.9 mg/dL (ref 8.9–10.3)
CHLORIDE: 95 mmol/L — AB (ref 101–111)
CO2: 20 mmol/L — ABNORMAL LOW (ref 22–32)
CREATININE: 7.14 mg/dL — AB (ref 0.44–1.00)
GFR calc non Af Amer: 7 mL/min — ABNORMAL LOW (ref >60)
GFR, EST AFRICAN AMERICAN: 8 mL/min — AB (ref >60)
Glucose, Bld: 208 mg/dL — ABNORMAL HIGH (ref 65–99)
Phosphorus: 7.2 mg/dL — ABNORMAL HIGH (ref 2.5–4.6)
Potassium: 5.1 mmol/L (ref 3.5–5.1)
SODIUM: 131 mmol/L — AB (ref 135–145)

## 2017-01-11 SURGERY — ECHOCARDIOGRAM, TRANSESOPHAGEAL
Anesthesia: Moderate Sedation

## 2017-01-11 MED ORDER — LIDOCAINE HCL (PF) 1 % IJ SOLN
5.0000 mL | INTRAMUSCULAR | Status: DC | PRN
  Administered 2017-01-15: 100 mg via INTRADERMAL

## 2017-01-11 MED ORDER — ALTEPLASE 2 MG IJ SOLR: 2.0000 mg | Freq: Once | INTRAMUSCULAR | Status: DC | PRN

## 2017-01-11 MED ORDER — MIDAZOLAM HCL 5 MG/ML IJ SOLN
INTRAMUSCULAR | Status: AC
  Filled 2017-01-11: qty 2

## 2017-01-11 MED ORDER — LIDOCAINE-PRILOCAINE 2.5-2.5 % EX CREA: 1.0000 "application " | TOPICAL_CREAM | CUTANEOUS | Status: DC | PRN

## 2017-01-11 MED ORDER — ONDANSETRON HCL 4 MG/2ML IJ SOLN
4.0000 mg | Freq: Once | INTRAMUSCULAR | Status: AC
  Administered 2017-01-11: 4 mg via INTRAVENOUS
  Filled 2017-01-11 (×2): qty 2

## 2017-01-11 MED ORDER — HEPARIN SODIUM (PORCINE) 1000 UNIT/ML DIALYSIS: 1000.0000 [IU] | INTRAMUSCULAR | Status: DC | PRN

## 2017-01-11 MED ORDER — HEPARIN (PORCINE) IN NACL 100-0.45 UNIT/ML-% IJ SOLN
950.0000 [IU]/h | INTRAMUSCULAR | Status: DC
  Administered 2017-01-11: 1200 [IU]/h via INTRAVENOUS
  Administered 2017-01-12 – 2017-01-13 (×2): 950 [IU]/h via INTRAVENOUS
  Filled 2017-01-11 (×3): qty 250

## 2017-01-11 MED ORDER — BUTAMBEN-TETRACAINE-BENZOCAINE 2-2-14 % EX AERO
INHALATION_SPRAY | CUTANEOUS | Status: DC | PRN
  Administered 2017-01-11: 2 via TOPICAL

## 2017-01-11 MED ORDER — ONDANSETRON HCL 4 MG PO TABS: 4.0000 mg | ORAL_TABLET | Freq: Once | ORAL | Status: DC

## 2017-01-11 MED ORDER — SODIUM CHLORIDE 0.9 % IV SOLN: 100.0000 mL | INTRAVENOUS | Status: DC | PRN

## 2017-01-11 MED ORDER — FENTANYL CITRATE (PF) 100 MCG/2ML IJ SOLN
INTRAMUSCULAR | Status: DC | PRN
  Administered 2017-01-11 (×2): 25 ug via INTRAVENOUS

## 2017-01-11 MED ORDER — MIDAZOLAM HCL 10 MG/2ML IJ SOLN
INTRAMUSCULAR | Status: DC | PRN
  Administered 2017-01-11 (×2): 2 mg via INTRAVENOUS
  Administered 2017-01-11: 1 mg via INTRAVENOUS

## 2017-01-11 MED ORDER — PENTAFLUOROPROP-TETRAFLUOROETH EX AERO: 1.0000 "application " | INHALATION_SPRAY | CUTANEOUS | Status: DC | PRN

## 2017-01-11 MED ORDER — HEPARIN SODIUM (PORCINE) 1000 UNIT/ML DIALYSIS: 20.0000 [IU]/kg | INTRAMUSCULAR | Status: DC | PRN

## 2017-01-11 MED ORDER — FENTANYL CITRATE (PF) 100 MCG/2ML IJ SOLN
INTRAMUSCULAR | Status: AC
  Filled 2017-01-11: qty 2

## 2017-01-11 NOTE — Plan of Care (Signed)
Problem: Health Behavior/Discharge Planning: Goal: Ability to manage health-related needs will improve Outcome: Progressing Patient reports that nausea/vomitting is manageable when she takes reglan, phenegran and zofran together in the morning and then again at bedtime. Given reglan and phenegran this morning and will give zofran before TEE per patient request.

## 2017-01-11 NOTE — Progress Notes (Signed)
  Echocardiogram Echocardiogram Transesophageal has been performed.  Olivier Frayre L Androw 01/11/2017, 12:34 PM

## 2017-01-11 NOTE — Progress Notes (Signed)
[facility] Progress Note   Dialysis Orders: MWF -[facility]  4hrs BFR 400, DFR 800, EDW 84kg, 2K/2Ca TDC, L AVF maturing  Heparin 2500U IV bolus Calcitriol 0.46mcg PO qHD  Last labs: 9/19: hgb 11.1, 9/12: Ca 9.8, P 5.7, 8/22: TSAT 31%, Alb 2.8, PTH 282  Assessment/Plan: 1.  R atrial echodensity mass             -TEE to be completed today pending N/V       -Cardiology following 2.  ESRD - MWF             -HD yesterday. Net UF 1.05L. Post wt 86.9, not meeting EDW as outpatient, increased to 84.5kg. Need standing pre and post weights  -Orders written for inpatient HD tomorrow, continue MWF schedule 3.  Hypertension/volume               -BP stable, continue labetalol, hold pre HD.             -Continue to titrate down volume. 4.  Anemia  - Hgb trending down, 10.6>9.7, monitor. May need ESA if continues to follow trend.  5.  Metabolic bone disease -             -Ca, P and PTH with in goal.  Continue TUMS, and calcitriol 0.68mcg qHD.   6.  Nutrition - Nepro TID, Renavite, and Renal diet.  7. Type 1 DM - on insulin, managed by PCP 8. Gastroparesis  -N/V most of the day yesterday. Improved today.  -On Reglan, Phenergan and Zofran   [name], PA-C [facility] 01/11/2017,8:22 AM  LOS: 0 days    Subjective:   Patient resting in bed, states she is feeling better today. Reports tolerating HD well, but N/V for most of the day with the last episode of V at 5pm.  She is unsure if it is related to HD or because she did not have her usual antiemetics yesterday morning.  TEE rescheduled for today due to n/v.   Objective Vitals:   01/10/17 1654 01/10/17 2116 01/10/17 2339 01/11/17 0435  BP: (!) 97/45 114/61  133/78  Pulse: 95 90  81  Resp: 18 15  12   Temp: 98.8 F (37.1 C) 98.9 F (37.2 C)  98.5 F (36.9 C)  TempSrc: Oral     SpO2: 100% 100%  100%  Weight:   86.2 kg (190 lb)   Height:        Intake/Output Summary (Last 24 hours) at 01/11/17  0843 Last data filed at 01/11/17 0600  Gross per 24 hour  Intake              540 ml  Output             1051 ml  Net             -511 ml   Physical Exam General: NAD, pleasant female Heart:RRR, no m/r/g Lungs:CTA b/l Abdomen:soft, NT, no masses Extremities: trace non pitting edema in b/l ankles/feet Dialysis Access: TDC, LU AVF maturing, +thrill, +bruit   Additional Objective Labs: Basic Metabolic Panel:  Recent Labs Lab 01/09/17 1409 01/10/17 0233  NA 132* 135  K 5.1 4.2  CL 91* 96*  CO2 27 26  GLUCOSE 309* 96  BUN 55* 60*  CREATININE 7.73* 8.58*  CALCIUM 8.7* 8.7*   Liver Function Tests:  Recent Labs Lab 01/09/17 1409 01/10/17 0233  AST 14* 12*  ALT 13* 10*  ALKPHOS 77 68  BILITOT  0.8 0.6  PROT 7.1 6.4*  ALBUMIN 3.5 3.1*   No results for input(s): LIPASE, AMYLASE in the last 168 hours. CBC:  Recent Labs Lab 01/09/17 1409 01/10/17 0233  WBC 7.1 5.9  NEUTROABS 4.8  --   HGB 10.6* 9.7*  HCT 32.4* 29.7*  MCV 92.8 92.5  PLT 261 234   CBG:  Recent Labs Lab 01/10/17 1652 01/10/17 2114 01/10/17 2355 01/11/17 0432 01/11/17 0757  GLUCAP 181* 200* 208* 218* 187*    Lab Results  Component Value Date   INR 1.03 01/09/2017   INR 0.98 09/07/2016   Studies/Results: No results found. Medications: . sodium chloride     . calcitRIOL  0.25 mcg Oral Q M,W,F-HD  . calcium carbonate  1 tablet Oral TID WC  . carbamazepine  200 mg Oral QHS  . gabapentin  300 mg Oral QHS  . heparin  5,000 Units Subcutaneous Q8H  . insulin aspart  0-9 Units Subcutaneous TID WC  . insulin glargine  10 Units Subcutaneous Daily  . labetalol  300 mg Oral BID  . metoCLOPramide  5 mg Oral TID AC  . ondansetron (ZOFRAN) IV  4 mg Intravenous Once  . ramelteon  8 mg Oral QHS  . sevelamer carbonate  800 mg Oral TID WC  . sodium chloride flush  3 mL Intravenous Q12H    I have seen and examined this patient and agree with plan and assessment in the above note with renal  recommendations/intervention highlighted.  For TEE today.  She still remains very nervous about results.  Cont with HD qMWF while she remains an inpatient.  I have also increased her EDW due to issues with dropping BP and feeling faint at outpatient HD. Will also use sodium modeling to help prevent hypotensive episodes.  Broadus John A Deshae Dickison,MD 01/11/2017 11:42 AM

## 2017-01-11 NOTE — H&P (View-Only) (Signed)
Progress Note  Patient Name: Carrie [name] Date of Encounter: 01/10/2017  Primary Cardiologist: New  Subjective   She was nauseated and throwing up during dialysis so the TEE was cancelled.   Inpatient Medications    Scheduled Meds: . calcitRIOL  0.25 mcg Oral Q M,W,F-HD  . calcium carbonate  1 tablet Oral TID WC  . carbamazepine  200 mg Oral QHS  . gabapentin  300 mg Oral QHS  . heparin  5,000 Units Subcutaneous Q8H  . insulin aspart  0-9 Units Subcutaneous TID WC  . insulin glargine  10 Units Subcutaneous Daily  . labetalol  300 mg Oral BID  . metoCLOPramide  5 mg Oral TID AC  . ramelteon  8 mg Oral QHS  . sevelamer carbonate  800 mg Oral TID WC  . sodium chloride flush  3 mL Intravenous Q12H   Continuous Infusions: . sodium chloride     PRN Meds: acetaminophen **OR** acetaminophen, promethazine, senna-docusate   Vital Signs    Vitals:   01/09/17 1800 01/09/17 2000 01/09/17 2040 01/10/17 0501  BP: (!) 168/91 (!) 158/90 (!) 174/107 (!) 164/97  Pulse: 90 88  88  Resp: 13 (!) 26 20 18   Temp:   98.4 F (36.9 C) 98.5 F (36.9 C)  TempSrc:   Oral Oral  SpO2: 100% 100% 100% 100%  Weight:   188 lb 11.4 oz (85.6 kg)   Height:   5\' 8"  (1.727 m)     Intake/Output Summary (Last 24 hours) at 01/10/17 0817 Last data filed at 01/10/17 0645  Gross per 24 hour  Intake              120 ml  Output              200 ml  Net              -80 ml   Filed Weights   01/09/17 1229 01/09/17 2040  Weight: 189 lb (85.7 kg) 188 lb 11.4 oz (85.6 kg)    Telemetry    NA - Personally Reviewed  ECG    NA - Personally Reviewed  Physical Exam   GEN: No acute distress.   Neck: No  JVD Cardiac: RRR, very brief apical systolic murmurs, rubs, or gallops.  Respiratory: Clear to auscultation bilaterally. GI: Soft, nontender, non-distended  MS: No  edema; No deformity. Neuro:  Nonfocal  Psych: Normal affect   Labs    Chemistry Recent Labs Lab 01/09/17 1409 01/10/17 0233   NA 132* 135  K 5.1 4.2  CL 91* 96*  CO2 27 26  GLUCOSE 309* 96  BUN 55* 60*  CREATININE 7.73* 8.58*  CALCIUM 8.7* 8.7*  PROT 7.1 6.4*  ALBUMIN 3.5 3.1*  AST 14* 12*  ALT 13* 10*  ALKPHOS 77 68  BILITOT 0.8 0.6  GFRNONAA 6* 5*  GFRAA 7* 6*  ANIONGAP 14 13     Hematology Recent Labs Lab 01/09/17 1409 01/10/17 0233  WBC 7.1 5.9  RBC 3.49* 3.21*  HGB 10.6* 9.7*  HCT 32.4* 29.7*  MCV 92.8 92.5  MCH 30.4 30.2  MCHC 32.7 32.7  RDW 16.4* 16.5*  PLT 261 234    Cardiac EnzymesNo results for input(s): TROPONINI in the last 168 hours. No results for input(s): TROPIPOC in the last 168 hours.   BNPNo results for input(s): BNP, PROBNP in the last 168 hours.   DDimer No results for input(s): DDIMER in the last 168 hours.   Radiology  No results found.  Cardiac Studies   NA  Patient Profile     36 y.o. female without prior cardiac history who is admitted for evaluation of a right atrial mass.  This was found at [facility] when she had TTE as part of a renal transplant evaluation.    Assessment & Plan    RA mass:    Plan for TEE in the AM.    Signed, Minus Breeding, MD  01/10/2017, 8:17 AM

## 2017-01-11 NOTE — Progress Notes (Signed)
01/11/17 00:30 Patient c/o nausea despite phenergan already given. MD on call notified via text paged. Order received for zofran. 00:50 Went to give zofran but patient asleep. Checked on patient couple more times but she remains asleep. Yarethzy Croak, Wonda Cheng, Therapist, sports

## 2017-01-11 NOTE — Consult Note (Addendum)
Carrie [name]       Carrie [name],Carrie [name]             [phone]          CARDIOTHORACIC SURGERY CONSULTATION REPORT  PCP is Glendon Axe, MD Referring Provider is Candee Furbish, MD Primary Cardiologist is Minus Breeding, MD  Reason for consultation:  Right atrial mass  HPI:  Patient is a 36 year old African-American female with hypertension and type 1 diabetes complicated by end-stage renal disease, neuropathy, and gastroparesis who has been referred for surgical consultation to discuss treatment options for management of recently discovered right atrial mass. The patient has history of juvenile onset diabetes and ultimately developed end-stage renal disease requiring initiation of hemodialysis last May. She was recently being evaluated for possible renal transplantation and underwent a routine transthoracic echocardiogram which revealed a mass in the right atrium. She was admitted to [facility] where she underwent transesophageal echocardiogram that confirmed the presence of a mass in the right atrium. [facility] thoracic surgical consultation was requested.  The patient reports no acute symptoms whatsoever. She states that she feels poorly 3 days a week immediately following dialysis but this is chronic and stable.  She dialyzes through a tunneled dialysis catheter placed in the right internal jugular vein last May. She dialyzes 3 times a week at the [facility]. She tells me that frequently her dialysis catheter clots off near the end of dialysis runs. She denies any history of catheter-related infections, fevers, chills, shortness of breath, or pleuritic chest pain. She reports occasional palpitations.  Past Medical History:  Diagnosis Date  . Diabetes mellitus without complication ([place])   . Diabetic neuropathy ([place])   . Gastroparesis   . HA (headache)   . Hemodialysis access site with arteriovenous graft ([place])   . Hypertension   .  Neuropathy   . Renal disorder   . Trigeminal neuralgia     Past Surgical History:  Procedure Laterality Date  . AV FISTULA PLACEMENT Left 09/07/2016   Procedure: Left arm 1st Stage Basilic AV Fistula;  Surgeon: [name], MD;  Location: [facility] OR;  Service: Vascular;  Laterality: Left;  . EYE SURGERY    . INSERTION OF DIALYSIS CATHETER Right 09/07/2016   Procedure: INSERTION OF DIALYSIS CATHETER;  Surgeon: [name], MD;  Location: [facility];  Service: Vascular;  Laterality: Right;  . None    . TEE WITHOUT CARDIOVERSION N/A 01/11/2017   Procedure: TRANSESOPHAGEAL ECHOCARDIOGRAM (TEE);  Surgeon: Jerline Pain, MD;  Location: [facility] ENDOSCOPY;  Service: Cardiovascular;  Laterality: N/A;    Family History  Problem Relation Age of Onset  . Hyperlipidemia Mother   . Asthma Daughter   . Diabetes Maternal Grandmother   . Stroke Maternal Grandmother   . Kidney disease Maternal Grandfather   . Kidney disease Paternal Grandmother     Social History   Social History  . Marital status: Single    Spouse name: N/A  . Number of children: 1  . Years of education: 6   Occupational History  . [employer]   Social History Main Topics  . Smoking status: Never Smoker  . Smokeless tobacco: Never Used  . Alcohol use No     Comment: former use  . Drug use: No  . Sexual activity: Yes   Other Topics Concern  . Not on file   Social History Narrative   Patient lives at home with her  father and daughter.    Education some college.   Right handed   Caffeine Five sodas daily.             Prior to Admission medications   Medication Sig Start Date End Date Taking? Authorizing Provider  acetaminophen (TYLENOL) 325 MG tablet Take 650 mg by mouth every 6 (six) hours as needed for mild pain.    [provider]  carbamazepine (TEGRETOL XR) 400 MG 12 hr tablet Take 200 mg by mouth once.    [provider]  gabapentin (NEURONTIN) 300 MG capsule Take 1  capsule (300 mg total) by mouth at bedtime. 09/12/16   Rosita Fire, MD  insulin aspart (NOVOLOG FLEXPEN) 100 UNIT/ML FlexPen Inject 5 Units into the skin 3 (three) times daily with meals. And pen needles 4/day 12/22/16   Renato Shin, MD  Insulin Glargine (LANTUS SOLOSTAR) 100 UNIT/ML Solostar Pen Inject 10 Units into the skin daily at 10 pm. 12/22/16   Renato Shin, MD  Insulin Pen Needle 32G X 4 MM MISC Use to inject insulin 1 time per day. 10/27/16   Renato Shin, MD  labetalol (NORMODYNE) 300 MG tablet Take 300 mg by mouth 3 (three) times daily. 08/25/16   [provider]  Metoclopramide HCl (REGLAN PO) Take by mouth.    [provider]  ondansetron (ZOFRAN) 4 MG tablet Take 1 tablet (4 mg total) by mouth every 8 (eight) hours as needed for nausea or vomiting. 09/12/16   Rosita Fire, MD  promethazine (PHENERGAN) 25 MG tablet Take 1 tablet (25 mg total) by mouth every 8 (eight) hours as needed for nausea or vomiting. 09/12/16   Rosita Fire, MD  sevelamer carbonate (RENVELA) 800 MG tablet Take 800 mg by mouth 3 (three) times daily with meals.    [provider]    Current Facility-Administered Medications  Medication Dose Route Frequency Provider Last Rate Last Dose  . 0.9 %  sodium chloride infusion  100 mL Intravenous PRN Penninger, Ria Comment, PA      . 0.9 %  sodium chloride infusion  100 mL Intravenous PRN Penninger, Ria Comment, PA      . acetaminophen (TYLENOL) tablet 650 mg  650 mg Oral Q6H PRN Burgess Estelle, MD       Or  . acetaminophen (TYLENOL) suppository 650 mg  650 mg Rectal Q6H PRN Burgess Estelle, MD      . alteplase (CATHFLO ACTIVASE) injection 2 mg  2 mg Intracatheter Once PRN Penninger, Ria Comment, PA      . calcitRIOL (ROCALTROL) capsule 0.25 mcg  0.25 mcg Oral Q M,W,F-HD Penninger, Lindsay, PA   0.25 mcg at 01/10/17 1130  . calcium carbonate (TUMS - dosed in mg elemental calcium) chewable tablet 200 mg of elemental calcium  1 tablet  Oral TID WC Penninger, Ria Comment, PA   200 mg of elemental calcium at 01/11/17 1729  . carbamazepine (TEGRETOL XR) 12 hr tablet 200 mg  200 mg Oral QHS Burgess Estelle, MD   200 mg at 01/10/17 2145  . gabapentin (NEURONTIN) capsule 300 mg  300 mg Oral QHS Burgess Estelle, MD   300 mg at 01/10/17 2145  . heparin injection 1,000 Units  1,000 Units Dialysis PRN Penninger, Ria Comment, PA      . heparin injection 1,700 Units  20 Units/kg Dialysis PRN Penninger, Ria Comment, PA      . heparin injection 5,000 Units  5,000 Units Subcutaneous Q8H Burgess Estelle, MD   5,000 Units at 01/11/17  1352  . insulin aspart (novoLOG) injection 0-9 Units  0-9 Units Subcutaneous TID WC Maryellen Pile, MD   7 Units at 01/11/17 1730  . insulin glargine (LANTUS) injection 10 Units  10 Units Subcutaneous Daily Maryellen Pile, MD   10 Units at 01/10/17 1354  . labetalol (NORMODYNE) tablet 300 mg  300 mg Oral BID Burgess Estelle, MD   300 mg at 01/11/17 1014  . lidocaine (PF) (XYLOCAINE) 1 % injection 5 mL  5 mL Intradermal PRN Penninger, Ria Comment, PA      . lidocaine-prilocaine (EMLA) cream 1 application  1 application Topical PRN Penninger, Ria Comment, PA      . metoCLOPramide (REGLAN) tablet 5 mg  5 mg Oral TID AC Bartholomew Crews, MD   5 mg at 01/11/17 1729  . pentafluoroprop-tetrafluoroeth (GEBAUERS) aerosol 1 application  1 application Topical PRN Penninger, Ria Comment, PA      . promethazine (PHENERGAN) tablet 12.5 mg  12.5 mg Oral Q6H PRN Burgess Estelle, MD   12.5 mg at 01/11/17 0817  . ramelteon (ROZEREM) tablet 8 mg  8 mg Oral QHS Chundi, Vahini, MD   8 mg at 01/10/17 2145  . senna-docusate (Senokot-S) tablet 1 tablet  1 tablet Oral QHS PRN Burgess Estelle, MD      . sevelamer carbonate (RENVELA) tablet 800 mg  800 mg Oral TID WC Burgess Estelle, MD   800 mg at 01/11/17 1729  . sodium chloride 0.9 % bolus 500 mL  500 mL Intravenous Once Carlota Raspberry, Tiffany, PA-C      . sodium chloride flush (NS) 0.9 % injection 3 mL  3 mL Intravenous  Q12H Burgess Estelle, MD   3 mL at 01/11/17 1016    Allergies  Allergen Reactions  . No Known Allergies       Review of Systems:   General:  normal appetite, normal energy, no weight gain, no weight loss, no fever  Cardiac:  no chest pain with exertion, no chest pain at rest, + stable chronic mild SOB with exertion, no resting SOB, no PND, no orthopnea, + palpitations, no arrhythmia, no atrial fibrillation, no LE edema, no dizzy spells, no syncope  Respiratory:  no shortness of breath, no home oxygen, no productive cough, no dry cough, no bronchitis, no wheezing, no hemoptysis, no asthma, no pain with inspiration or cough, no sleep apnea, no CPAP at night  GI:   no difficulty swallowing, no reflux, no frequent heartburn, no hiatal hernia, no abdominal pain, no constipation, no diarrhea, no hematochezia, no hematemesis, no melena  GU:   no dysuria,  no frequency, still makes some urine, no urinary tract infection, no hematuria, no kidney stones, + kidney disease  Vascular:  no pain suggestive of claudication, no pain in feet, no leg cramps, no varicose veins, no DVT, no non-healing foot ulcer  Neuro:   no stroke, no TIA's, no seizures, no headaches, no temporary blindness one eye,  no slurred speech, no peripheral neuropathy, no chronic pain, no instability of gait, no memory/cognitive dysfunction  Musculoskeletal: no arthritis , no joint swelling, no myalgias, no difficulty walking, normal mobility   Skin:   no rash, no itching, no skin infections, no pressure sores or ulcerations  Psych:   no anxiety, no depression, no nervousness, no unusual recent stress  Eyes:   no blurry vision, no floaters, no recent vision changes, does not wears glasses or contacts  ENT:   no hearing loss, no loose or painful teeth, no dentures, last saw dentist  within a few years  Hematologic:  no easy bruising, no abnormal bleeding, no clotting disorder, no frequent epistaxis  Endocrine:  + diabetes, does check  CBG's at home     Physical Exam:   BP (!) 149/89 (BP Location: Right Arm)   Pulse 90   Temp 98.3 F (36.8 C) (Oral)   Resp 18   Ht 5\' 8"  (1.727 m)   Wt 190 lb (86.2 kg)   LMP 07/17/2016 (Within Weeks) Comment: Has not had a period since starting dialysis in May  SpO2 100%   BMI 28.89 kg/m   General:  Moderately obese,  well-appearing  HEENT:  Unremarkable   Neck:   no JVD, no bruits, no adenopathy   Chest:   clear to auscultation, symmetrical breath sounds, no wheezes, no rhonchi   CV:   RRR, no  murmur   Abdomen:  soft, non-tender, no masses   Extremities:  warm, well-perfused, pulses palpable,  lower extremity edema  Rectal/GU  Deferred  Neuro:   Grossly non-focal and symmetrical throughout  Skin:   Clean and dry, no rashes, no breakdown  Diagnostic Tests:  Transesophageal Echocardiography  Patient:    Ash[place], Deupree MR #:       [id number] Study Date: 01/11/2017 Gender:     F Age:        36 Height:     172.7 cm Weight:     86.2 kg BSA:        2.05 m^2 Pt. Status: Room:       2W06C   ADMITTING    Bartholomew Crews  ATTENDING    Lenora Boys, M.D.  PERFORMING   Candee Furbish, M.D.  REFERRING    Candee Furbish, M.D.  SONOGRAPHER  Chelsea Androw  cc:  ------------------------------------------------------------------- LV EF: 65% -   70%  ------------------------------------------------------------------- Indications:     Right atrial mass  ------------------------------------------------------------------- History:   Risk factors:  Diabetes mellitus.  ------------------------------------------------------------------- Study Conclusions  - Left ventricle: The cavity size was normal. There was severe   concentric hypertrophy. Systolic function was vigorous. The   estimated ejection fraction was in the range of 65% to 70%. - Aortic valve: No evidence of vegetation. - Mitral valve: No evidence of vegetation. - Left  atrium: No evidence of thrombus in the appendage. - Right atrium: There was a large, 2.8 cm (L) x 1.7 cm (W),   spherical, fixed mass; based on its appearance, myxoma cannot be   excluded. It is just below the insertion of the IVC (IVC is   clear/patent). There is mobile thrombus assosciated distally with   mass. - Tricuspid valve: No evidence of vegetation. - Pulmonic valve: No evidence of vegetation. - Superior vena cava: The study excluded a thrombus.  Recommendations:  This procedure has been discussed with the referring physician.  ------------------------------------------------------------------- Study data:   Study status:  Routine.  Consent:  The risks, benefits, and alternatives to the procedure were explained to the patient and informed consent was obtained.  Procedure:  The patient reported no pain pre or post test. Initial setup. The patient was brought to the laboratory. Surface ECG leads were monitored. Sedation. Conscious sedation was administered by cardiology staff. Transesophageal echocardiography. An adult multiplane transesophageal probe was inserted by the attending cardiologistwithout difficulty. Image quality was adequate. The study was technically difficult, as a result of poor patient compliance.  Study completion:  The patient tolerated the procedure well. There  were no complications.          Diagnostic transesophageal echocardiography.  2D and color Doppler. Birthdate:  Patient birthdate: Jul 15, 1980.  Age:  Patient is 36 yr old.  Sex:  Gender: female.    BMI: 28.9 kg/m^2.  Blood pressure:   142/83  Patient status:  Inpatient.  Study date:  Study date: 01/11/2017. Study time: 11:37 AM.  Location:  Endoscopy.  -------------------------------------------------------------------  ------------------------------------------------------------------- Left ventricle:  The cavity size was normal. There was severe concentric hypertrophy. Systolic function was  vigorous. The estimated ejection fraction was in the range of 65% to 70%.  ------------------------------------------------------------------- Aortic valve:   Structurally normal valve.   Cusp separation was normal.  No evidence of vegetation.  Doppler:  There was no regurgitation.  ------------------------------------------------------------------- Aorta:  The aorta was normal, not dilated, and non-diseased.  ------------------------------------------------------------------- Mitral valve:   Structurally normal valve.   Leaflet separation was normal.  No evidence of vegetation.  Doppler:  There was trivial regurgitation.  ------------------------------------------------------------------- Left atrium:  The atrium was normal in size.  No evidence of thrombus in the appendage. The appendage was of normal size. Emptying velocity was normal.  ------------------------------------------------------------------- Right ventricle:  The cavity size was normal. Wall thickness was normal. Systolic function was normal.  ------------------------------------------------------------------- Pulmonic valve:    Structurally normal valve.   Cusp separation was normal.  No evidence of vegetation.  ------------------------------------------------------------------- Tricuspid valve:   Structurally normal valve.   Leaflet separation was normal.  No evidence of vegetation.  Doppler:  There was mild regurgitation.  ------------------------------------------------------------------- Pulmonary artery:   The main pulmonary artery was normal-sized.  ------------------------------------------------------------------- Right atrium:  There was a large, 2.8 cm (L) x 1.7 cm (W), spherical, fixed mass; based on its appearance, myxoma cannot be excluded. It is just below the insertion of the IVC (IVC is clear/patent). There is mobile thrombus assosciated distally with mass. Dialysis catheter is noted  in RA. No associated thrombus.  ------------------------------------------------------------------- Pericardium:  The pericardium was normal in appearance. There was no pericardial effusion.  ------------------------------------------------------------------- Systemic veins: Superior vena cava: The study excluded a thrombus.  ------------------------------------------------------------------- Post procedure conclusions Ascending Aorta:  - The aorta was normal, not dilated, and non-diseased.  ------------------------------------------------------------------- Measurements   Left ventricle                    Value    Reference  LV ID, ED, PLAX chordal (L)       36.4  mm 43 - 52  LV PW thickness, ED               17.2  mm ---------  IVS/LV PW ratio, ED               0.93     <=1.3    Ventricular septum                Value    Reference  IVS thickness, ED                 16    mm ---------  Legend: (L)  and  (H)  mark values outside specified reference range.  ------------------------------------------------------------------- Prepared and Electronically Authenticated by  Candee Furbish, M.D. 2018-09-27T13:25:58   Impression:  I have personally reviewed the patient's transesophageal echocardiogram. There is a mass adjacent to the end of the dialysis catheter adherent to the wall of the atrium with shaggy irregular appearance most consistent with clot. There are no features suggestive of atrial myxoma  nor other benign-appearing tumor. The tip of the catheter crosses almost the entire right atrium and appears to low.  Plan:  I think she should be started on heparin and have CTA chest to r/o PE.  Her dialysis catheter should probably be removed and replaced so tip isn't so far in the RA, but it probably would be best to wait and see if the mass resolves w/ anticoagulation to minimize the risk of embolization.  Cardiac MRI might be helpful to better characterize the  consistency of the mass (which doesn't look like a typical mxyoma) but MRI would require gadolinium contrast.  Options include therapeutic anticoagulation x 3 months w/ f/u TEE to see if the mass has resolved vs surgical extraction via mini thoracotomy approach vs possible vacuum extraction using an investigational device we have available at [facility].  Will follow and discuss options further once CTA done.   I spent in excess of 90 minutes during the conduct of this hospital consultation and >50% of this time involved direct face-to-face encounter for counseling and/or coordination of the patient's care.   Valentina Gu. Roxy Manns, MD 01/11/2017 5:54 PM

## 2017-01-11 NOTE — Progress Notes (Signed)
ANTICOAGULATION CONSULT NOTE - Initial Consult  Pharmacy Consult:  Heparin Indication:  Possible PE  Allergies  Allergen Reactions  . No Known Allergies     Patient Measurements: Height: 5\' 8"  (172.7 cm) Weight: 190 lb (86.2 kg) IBW/kg (Calculated) : 63.9 Heparin Dosing Weight: 82 kg  Vital Signs: Temp: 98.3 F (36.8 C) (09/27 1748) Temp Source: Oral (09/27 1748) BP: 149/89 (09/27 1748) Pulse Rate: 90 (09/27 1748)  Labs:  Recent Labs  01/09/17 1409 01/10/17 0233 01/11/17 1330  HGB 10.6* 9.7*  --   HCT 32.4* 29.7*  --   PLT 261 234  --   LABPROT 13.4  --   --   INR 1.03  --   --   CREATININE 7.73* 8.58* 7.14*    Estimated Creatinine Clearance: 12.8 mL/min (A) (by C-G formula based on SCr of 7.14 mg/dL (H)).   Medical History: Past Medical History:  Diagnosis Date  . Diabetes mellitus without complication ([place])   . Diabetic neuropathy ([place])   . Gastroparesis   . HA (headache)   . Hemodialysis access site with arteriovenous graft ([place])   . Hypertension   . Neuropathy   . Renal disorder   . Trigeminal neuralgia       Assessment: 33 YOF presented for evaluation of right atrial mass, now concerned with a clot.  Pharmacy consulted to initiate IV heparin.  Patient received heparin SQ around 1400.  No bleeding documented.   Goal of Therapy:  Heparin level 0.3-0.7 units/ml Monitor platelets by anticoagulation protocol: Yes    Plan:   Heparin gtt at 1200 units/hr, no bolus given timing of heparin SQ dose and ESRD Check 8 hr heparin level Daily heparin level and CBC F/U CTA   Siriyah Ambrosius D. Mina Marble, PharmD, BCPS Pager:  [phone] 01/11/2017, 6:38 PM

## 2017-01-11 NOTE — Progress Notes (Signed)
   Subjective:  No acute events overnight. Patient NPO for TEE today. Reports no complaints this morning. Would like cough drops after TEE. Will reevaluate after TTE.  Objective:  Vital signs in last 24 hours: Vitals:   01/10/17 1654 01/10/17 2116 01/10/17 2339 01/11/17 0435  BP: (!) 97/45 114/61  133/78  Pulse: 95 90  81  Resp: 18 15  12   Temp: 98.8 F (37.1 C) 98.9 F (37.2 C)  98.5 F (36.9 C)  TempSrc: Oral     SpO2: 100% 100%  100%  Weight:   190 lb (86.2 kg)   Height:       General: pleasant female, well-developed, well-nourished, sleeping in bed in no acute distress  Cardiac: regular rate and rhythm, nl S1/S2, no murmurs, rubs or gallops  Pulm: CTAB, no wheezes or crackles, no increased work of breathing  Abd: soft, NTND, bowel sounds present  Neuro: A&Ox3, able to move all 4 extremities, no focal deficits noted  Ext: warm and well perfused, no peripheral edema    Assessment/Plan:  Carrie [name]. Carrie [name] a 36 year old female with history of ESRD 2/2 DM and HTN on HD through HD cath, uncontrolled V4MG complicated by peripheral neuropathy and gastroparesis, HTN, and trigeminal neuralgia who presents for evaluation of right atrial mass found during workup for renal transplant.   # Right atrial mass: No known cardiac history. Found during workup for renal transplant. Patient remains hemodynamically stable and asymptomatic. Plan for TEE today. Will reevaluate after TTE. - Cardiology consulted, appreciate recommendations  - Plan for TEE today. Will follow up with results and cardiology recs. Cardiac MRI pending TEE results.   # ESRD: secondary to T1DM and HTN, on HD MWF. HD cath placed on 08/2016.  - Nephrology following, HD tomorrow if patient remains in hospital   # T1DM: uncontrolled, c/b peripheral neuropathy and gastroparesis. She had an episode of hypoglycemia yesterday (38) and has remained in the 200s since then. This morning her CBG was 218. Will continue current dose of  Lantus. - SQ Lantus 10U qAM  - SSI-S - Continue home gabapentin 300 MG QHS - Continue home antiemetics: reglan, zofran, and phenergan   # HTN: BP improving after HD  - Continue home labetalol 300 mg BID   # Trigeminal neuralgia:  - Continue home carbamazepine 200 mg QHS   F: none E: will continue to monitor  N: NPO for TEE-->Renal and CM diet   VTE ppx: SQ heparin   Code status: Full code, confirmed on admission   Dispo: Anticipated discharge in approximately 1-2 day(s) pending cardiac workup.   Welford Roche, MD  Internal Medicine PGY-1  P [phone]

## 2017-01-11 NOTE — Progress Notes (Addendum)
TCTS BRIEF CONSULT NOTE   Patient seen and examined, chart and TEE reviewed.  Full consult note to follow.  I am suspicious that the patient's RA mass is clot and related to her dialysis catheter, which is clearly in too far.  Moreover, the patient states that they have had problems with her catheter clotting off towards the end of dialysis runs on several occasions.  I think she should be started on heparin and have CTA chest to r/o PE.  Her dialysis catheter should probably be removed and replaced so tip isn't so far in the RA.  Cardiac MRI might be helpful to better characterize the consistency of the mass (which doesn't look like a typical mxyoma) but MRI would require gadolinium contrast.  Options include therapeutic anticoagulation x 3 months w/ f/u TEE to see if the mass has resolved vs surgical extraction via mini thoracotomy approach vs possible vacuum extraction using an investigational device we have available at [facility].  Will follow and discuss options further once CTA done.   Rexene Alberts, MD 01/11/2017 6:20 PM

## 2017-01-11 NOTE — Progress Notes (Signed)
Progress Note  Patient Name: WRIGLEY WINBORNE Date of Encounter: 01/11/2017  Primary Cardiologist: New  Subjective   No chest pain.  No nausea  Inpatient Medications    Scheduled Meds: . calcitRIOL  0.25 mcg Oral Q M,W,F-HD  . calcium carbonate  1 tablet Oral TID WC  . carbamazepine  200 mg Oral QHS  . gabapentin  300 mg Oral QHS  . heparin  5,000 Units Subcutaneous Q8H  . insulin aspart  0-9 Units Subcutaneous TID WC  . insulin glargine  10 Units Subcutaneous Daily  . labetalol  300 mg Oral BID  . metoCLOPramide  5 mg Oral TID AC  . ramelteon  8 mg Oral QHS  . sevelamer carbonate  800 mg Oral TID WC  . sodium chloride flush  3 mL Intravenous Q12H   Continuous Infusions: . sodium chloride     PRN Meds: acetaminophen **OR** acetaminophen, promethazine, senna-docusate   Vital Signs    Vitals:   01/10/17 2116 01/10/17 2339 01/11/17 0435 01/11/17 0900  BP: 114/61  133/78 132/68  Pulse: 90  81 85  Resp: 15  12 18   Temp: 98.9 F (37.2 C)  98.5 F (36.9 C) 97.6 F (36.4 C)  TempSrc:    Oral  SpO2: 100%  100% 100%  Weight:  190 lb (86.2 kg)    Height:        Intake/Output Summary (Last 24 hours) at 01/11/17 1036 Last data filed at 01/11/17 1016  Gross per 24 hour  Intake              543 ml  Output             1051 ml  Net             -508 ml   Filed Weights   01/10/17 0827 01/10/17 1227 01/10/17 2339  Weight: 190 lb 7.6 oz (86.4 kg) 191 lb 9.3 oz (86.9 kg) 190 lb (86.2 kg)    Telemetry    NSR - Personally Reviewed  ECG    NA - Personally Reviewed  Physical Exam   GEN: No  acute distress.   Neck: No  JVD Cardiac: RRR, no murmurs, rubs, or gallops.  Respiratory: Clear   to auscultation bilaterally. GI: Soft, nontender, non-distended, normal bowel sounds  MS:  No edema; No deformity. Neuro:   Nonfocal  Psych: Oriented and appropriate    Labs    Chemistry  Recent Labs Lab 01/09/17 1409 01/10/17 0233  NA 132* 135  K 5.1 4.2  CL 91* 96*    CO2 27 26  GLUCOSE 309* 96  BUN 55* 60*  CREATININE 7.73* 8.58*  CALCIUM 8.7* 8.7*  PROT 7.1 6.4*  ALBUMIN 3.5 3.1*  AST 14* 12*  ALT 13* 10*  ALKPHOS 77 68  BILITOT 0.8 0.6  GFRNONAA 6* 5*  GFRAA 7* 6*  ANIONGAP 14 13     Hematology  Recent Labs Lab 01/09/17 1409 01/10/17 0233  WBC 7.1 5.9  RBC 3.49* 3.21*  HGB 10.6* 9.7*  HCT 32.4* 29.7*  MCV 92.8 92.5  MCH 30.4 30.2  MCHC 32.7 32.7  RDW 16.4* 16.5*  PLT 261 234    Cardiac EnzymesNo results for input(s): TROPONINI in the last 168 hours. No results for input(s): TROPIPOC in the last 168 hours.   BNPNo results for input(s): BNP, PROBNP in the last 168 hours.   DDimer No results for input(s): DDIMER in the last 168 hours.   Radiology  No results found.  Cardiac Studies   NA  Patient Profile     36 y.o. female without prior cardiac history who is admitted for evaluation of a right atrial mass.  This was found at [facility] when she had TTE as part of a renal transplant evaluation.    Assessment & Plan    RA mass:    Reviewed images with Dr. 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  No plans for an MRI at this point as she could not get contrast I am not sure how useful a non contrasted study would be.  We will ask cardiovascular surgery to review the TEE with [place] and comment on possible resection.    Signed, Minus Breeding, MD  01/11/2017, 10:36 AM

## 2017-01-11 NOTE — Progress Notes (Signed)
Advised both the nurse & the patient we would attempt an IV in the morning. The nurse is aware that the attempt was unsuccessful. Carrie [name]

## 2017-01-11 NOTE — CV Procedure (Addendum)
   TEE  Indications: Evaluate RA mass  Time out performed  During this procedure the patient is administered a total of Versed 5 mg and Fentanyl 50 mcg to achieve and maintain moderate conscious sedation.  The patient's heart rate, blood pressure, and oxygen saturation are monitored continuously during the procedure. The period of conscious sedation is 35 minutes, of which I was present face-to-face 100% of this time.  Findings:  A large 2.8 x 1.7 lobulated mass is attached to the RA free wall below the insertion of the IVC (IVC is patent). There is a focal strand like associated thrombus. Differential includes myoxoma.  Normal EF, severe LVH (1.7cm), mild TR.  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 Bubbles did not cross into LA.   I have relayed to Dr. Percival Spanish (currently on his service) and have placed a consult with TCTS to review.   Candee Furbish, MD

## 2017-01-11 NOTE — Interval H&P Note (Signed)
History and Physical Interval Note:  01/11/2017 11:32 AM  Carrie [name]  has presented today for surgery, with the diagnosis of right atrial mass  The various methods of treatment have been discussed with the patient and family. After consideration of risks, benefits and other options for treatment, the patient has consented to  Procedure(s): TRANSESOPHAGEAL ECHOCARDIOGRAM (TEE) (N/A) as a surgical intervention .  The patient's history has been reviewed, patient examined, no change in status, stable for surgery.  I have reviewed the patient's chart and labs.  Questions were answered to the patient's satisfaction.     [employer]

## 2017-01-12 ENCOUNTER — Encounter (HOSPITAL_COMMUNITY): Payer: Self-pay | Admitting: Thoracic Surgery (Cardiothoracic Vascular Surgery)

## 2017-01-12 DIAGNOSIS — E1065 Type 1 diabetes mellitus with hyperglycemia: Secondary | ICD-10-CM | POA: Diagnosis present

## 2017-01-12 DIAGNOSIS — I120 Hypertensive chronic kidney disease with stage 5 chronic kidney disease or end stage renal disease: Secondary | ICD-10-CM | POA: Diagnosis present

## 2017-01-12 DIAGNOSIS — R931 Abnormal findings on diagnostic imaging of heart and coronary circulation: Secondary | ICD-10-CM | POA: Diagnosis not present

## 2017-01-12 DIAGNOSIS — N2581 Secondary hyperparathyroidism of renal origin: Secondary | ICD-10-CM | POA: Diagnosis present

## 2017-01-12 DIAGNOSIS — E1042 Type 1 diabetes mellitus with diabetic polyneuropathy: Secondary | ICD-10-CM | POA: Diagnosis present

## 2017-01-12 DIAGNOSIS — E871 Hypo-osmolality and hyponatremia: Secondary | ICD-10-CM | POA: Diagnosis not present

## 2017-01-12 DIAGNOSIS — Z79899 Other long term (current) drug therapy: Secondary | ICD-10-CM | POA: Diagnosis not present

## 2017-01-12 DIAGNOSIS — Z992 Dependence on renal dialysis: Secondary | ICD-10-CM

## 2017-01-12 DIAGNOSIS — G500 Trigeminal neuralgia: Secondary | ICD-10-CM | POA: Diagnosis present

## 2017-01-12 DIAGNOSIS — N186 End stage renal disease: Secondary | ICD-10-CM | POA: Diagnosis present

## 2017-01-12 DIAGNOSIS — Z794 Long term (current) use of insulin: Secondary | ICD-10-CM | POA: Diagnosis not present

## 2017-01-12 DIAGNOSIS — E1043 Type 1 diabetes mellitus with diabetic autonomic (poly)neuropathy: Secondary | ICD-10-CM | POA: Diagnosis present

## 2017-01-12 DIAGNOSIS — I513 Intracardiac thrombosis, not elsewhere classified: Secondary | ICD-10-CM | POA: Diagnosis present

## 2017-01-12 DIAGNOSIS — I519 Heart disease, unspecified: Secondary | ICD-10-CM | POA: Diagnosis not present

## 2017-01-12 DIAGNOSIS — Z9114 Patient's other noncompliance with medication regimen: Secondary | ICD-10-CM | POA: Diagnosis not present

## 2017-01-12 DIAGNOSIS — Z8349 Family history of other endocrine, nutritional and metabolic diseases: Secondary | ICD-10-CM | POA: Diagnosis not present

## 2017-01-12 DIAGNOSIS — N185 Chronic kidney disease, stage 5: Secondary | ICD-10-CM | POA: Diagnosis not present

## 2017-01-12 DIAGNOSIS — R791 Abnormal coagulation profile: Secondary | ICD-10-CM | POA: Diagnosis not present

## 2017-01-12 DIAGNOSIS — I10 Essential (primary) hypertension: Secondary | ICD-10-CM | POA: Diagnosis not present

## 2017-01-12 DIAGNOSIS — Q211 Atrial septal defect: Secondary | ICD-10-CM | POA: Diagnosis not present

## 2017-01-12 DIAGNOSIS — E1040 Type 1 diabetes mellitus with diabetic neuropathy, unspecified: Secondary | ICD-10-CM | POA: Diagnosis not present

## 2017-01-12 DIAGNOSIS — E119 Type 2 diabetes mellitus without complications: Secondary | ICD-10-CM

## 2017-01-12 DIAGNOSIS — K5900 Constipation, unspecified: Secondary | ICD-10-CM | POA: Diagnosis not present

## 2017-01-12 DIAGNOSIS — E1022 Type 1 diabetes mellitus with diabetic chronic kidney disease: Secondary | ICD-10-CM | POA: Diagnosis present

## 2017-01-12 DIAGNOSIS — M898X9 Other specified disorders of bone, unspecified site: Secondary | ICD-10-CM | POA: Diagnosis present

## 2017-01-12 DIAGNOSIS — I770 Arteriovenous fistula, acquired: Secondary | ICD-10-CM | POA: Diagnosis not present

## 2017-01-12 DIAGNOSIS — E10649 Type 1 diabetes mellitus with hypoglycemia without coma: Secondary | ICD-10-CM | POA: Diagnosis not present

## 2017-01-12 DIAGNOSIS — K3184 Gastroparesis: Secondary | ICD-10-CM | POA: Diagnosis present

## 2017-01-12 DIAGNOSIS — D631 Anemia in chronic kidney disease: Secondary | ICD-10-CM | POA: Diagnosis present

## 2017-01-12 DIAGNOSIS — Z833 Family history of diabetes mellitus: Secondary | ICD-10-CM | POA: Diagnosis not present

## 2017-01-12 LAB — RENAL FUNCTION PANEL
ALBUMIN: 3.6 g/dL (ref 3.5–5.0)
ANION GAP: 15 (ref 5–15)
BUN: 52 mg/dL — ABNORMAL HIGH (ref 6–20)
CALCIUM: 9.1 mg/dL (ref 8.9–10.3)
CO2: 25 mmol/L (ref 22–32)
CREATININE: 8.27 mg/dL — AB (ref 0.44–1.00)
Chloride: 91 mmol/L — ABNORMAL LOW (ref 101–111)
GFR, EST AFRICAN AMERICAN: 7 mL/min — AB (ref >60)
GFR, EST NON AFRICAN AMERICAN: 6 mL/min — AB (ref >60)
Glucose, Bld: 163 mg/dL — ABNORMAL HIGH (ref 65–99)
PHOSPHORUS: 7.4 mg/dL — AB (ref 2.5–4.6)
Potassium: 4.2 mmol/L (ref 3.5–5.1)
SODIUM: 131 mmol/L — AB (ref 135–145)

## 2017-01-12 LAB — CBC
HCT: 31.7 % — ABNORMAL LOW (ref 36.0–46.0)
HEMOGLOBIN: 10.5 g/dL — AB (ref 12.0–15.0)
MCH: 30.1 pg (ref 26.0–34.0)
MCHC: 33.1 g/dL (ref 30.0–36.0)
MCV: 90.8 fL (ref 78.0–100.0)
PLATELETS: 268 10*3/uL (ref 150–400)
RBC: 3.49 MIL/uL — AB (ref 3.87–5.11)
RDW: 16.2 % — ABNORMAL HIGH (ref 11.5–15.5)
WBC: 6.7 10*3/uL (ref 4.0–10.5)

## 2017-01-12 LAB — GLUCOSE, CAPILLARY
GLUCOSE-CAPILLARY: 253 mg/dL — AB (ref 65–99)
Glucose-Capillary: 151 mg/dL — ABNORMAL HIGH (ref 65–99)
Glucose-Capillary: 234 mg/dL — ABNORMAL HIGH (ref 65–99)
Glucose-Capillary: 259 mg/dL — ABNORMAL HIGH (ref 65–99)

## 2017-01-12 LAB — PROTIME-INR
INR: 1.03
Prothrombin Time: 13.4 seconds (ref 11.4–15.2)

## 2017-01-12 LAB — HEPARIN LEVEL (UNFRACTIONATED)
HEPARIN UNFRACTIONATED: 0.92 [IU]/mL — AB (ref 0.30–0.70)
Heparin Unfractionated: 0.98 IU/mL — ABNORMAL HIGH (ref 0.30–0.70)

## 2017-01-12 MED ORDER — WARFARIN SODIUM 7.5 MG PO TABS: 7.5000 mg | ORAL_TABLET | Freq: Once | ORAL | Status: DC

## 2017-01-12 MED ORDER — INSULIN ASPART 100 UNIT/ML ~~LOC~~ SOLN
3.0000 [IU] | Freq: Three times a day (TID) | SUBCUTANEOUS | Status: DC
  Administered 2017-01-12 – 2017-01-24 (×27): 3 [IU] via SUBCUTANEOUS

## 2017-01-12 MED ORDER — PROMETHAZINE HCL 25 MG PO TABS
ORAL_TABLET | ORAL | Status: AC
  Administered 2017-01-12: 12.5 mg via ORAL
  Filled 2017-01-12: qty 1

## 2017-01-12 MED ORDER — INSULIN ASPART 100 UNIT/ML ~~LOC~~ SOLN
0.0000 [IU] | Freq: Three times a day (TID) | SUBCUTANEOUS | Status: DC
  Administered 2017-01-12: 1 [IU] via SUBCUTANEOUS
  Administered 2017-01-12: 3 [IU] via SUBCUTANEOUS
  Administered 2017-01-14 – 2017-01-16 (×2): 1 [IU] via SUBCUTANEOUS
  Administered 2017-01-16 (×2): 0 [IU] via SUBCUTANEOUS
  Administered 2017-01-17 (×2): 3 [IU] via SUBCUTANEOUS
  Administered 2017-01-18 – 2017-01-19 (×2): 1 [IU] via SUBCUTANEOUS
  Administered 2017-01-19: 3 [IU] via SUBCUTANEOUS
  Administered 2017-01-20 – 2017-01-21 (×4): 1 [IU] via SUBCUTANEOUS
  Administered 2017-01-22: 3 [IU] via SUBCUTANEOUS
  Administered 2017-01-23: 1 [IU] via SUBCUTANEOUS

## 2017-01-12 MED ORDER — WARFARIN - PHARMACIST DOSING INPATIENT: Freq: Every day | Status: DC

## 2017-01-12 MED ORDER — ONDANSETRON HCL 4 MG PO TABS
4.0000 mg | ORAL_TABLET | Freq: Three times a day (TID) | ORAL | Status: DC | PRN
  Administered 2017-01-12 – 2017-01-17 (×6): 4 mg via ORAL
  Filled 2017-01-12 (×6): qty 1

## 2017-01-12 NOTE — Progress Notes (Signed)
ANTICOAGULATION CONSULT NOTE - Follow Up Consult  Pharmacy Consult for Heparin  Indication: Rule out PE, right atrial mass with possible clot  Allergies  Allergen Reactions  . No Known Allergies     Patient Measurements: Height: 5\' 8"  (172.7 cm) Weight: 194 lb (88 kg) IBW/kg (Calculated) : 63.9  Vital Signs: Temp: 98.1 F (36.7 C) (09/28 0357) BP: 167/95 (09/28 0357) Pulse Rate: 85 (09/28 0357)  Labs:  Recent Labs  01/09/17 1409 01/10/17 0233 01/11/17 1330 01/11/17 1834 01/12/17 0459  HGB 10.6* 9.7*  --  10.5* 10.5*  HCT 32.4* 29.7*  --  32.0* 31.7*  PLT 261 234  --  259 268  LABPROT 13.4  --   --   --   --   INR 1.03  --   --   --   --   HEPARINUNFRC  --   --   --   --  0.92*  CREATININE 7.73* 8.58* 7.14*  --  8.27*    Estimated Creatinine Clearance: 11.1 mL/min (A) (by C-G formula based on SCr of 8.27 mg/dL (H)).  Assessment: On heparin for rule out PE and right atrial mass with possible clot, heparin level is elevated at 0.92 this AM,   Goal of Therapy:  Heparin level 0.3-0.7 units/ml Monitor platelets by anticoagulation protocol: Yes   Plan:  Dec heparin to 1050 units/hr 1500 HL  Narda Bonds 01/12/2017,7:20 AM

## 2017-01-12 NOTE — Progress Notes (Signed)
Patient is nauseous and given phenergan earlier. She is requesting Zofran. MD notified and made aware. Will continue to monitor.

## 2017-01-12 NOTE — Consult Note (Signed)
Patient name: Carrie [name] MRN: [id number] DOB: April 23, 1980 Sex: female  REASON FOR CONSULT:    Needs left arm AV graft. The consult is requested by Dr. Marval Regal.   HPI:   Carrie [name] is a pleasant 36 y.o. female who had a left first stage basilic vein fistula created by Dr. Trula Slade o n 09/06/2016. The patient was seen in follow up by our nurse practitioner on 01/02/2017. The only previous ultrasound I can find in follow up was done on 10/03/2016. The diameters of the fistula at that time ranged from 0.21-0.51 cm. The patient was apparently going to be scheduled for either a second stage basilic vein transposition or placement of an AV graft.  ECHO yesterday showed a large, 2.8 cm x 1.7 cm, spherical fixed mass just below the insertion of the inferior vena cava. There was mobile thrombus associated with this distally. Based on these findings the patient is being anticoagulated. She is on intravenous heparin.   Current Facility-Administered Medications  Medication Dose Route Frequency Provider Last Rate Last Dose  . 0.9 %  sodium chloride infusion  100 mL Intravenous PRN Penninger, Ria Comment, PA      . 0.9 %  sodium chloride infusion  100 mL Intravenous PRN Penninger, Ria Comment, PA      . acetaminophen (TYLENOL) tablet 650 mg  650 mg Oral Q6H PRN Burgess Estelle, MD       Or  . acetaminophen (TYLENOL) suppository 650 mg  650 mg Rectal Q6H PRN Burgess Estelle, MD      . alteplase (CATHFLO ACTIVASE) injection 2 mg  2 mg Intracatheter Once PRN Penninger, Ria Comment, PA      . calcitRIOL (ROCALTROL) capsule 0.25 mcg  0.25 mcg Oral Q M,W,F-HD Penninger, Lindsay, PA   0.25 mcg at 01/10/17 1130  . calcium carbonate (TUMS - dosed in mg elemental calcium) chewable tablet 200 mg of elemental calcium  1 tablet Oral TID WC Penninger, Ria Comment, PA   200 mg of elemental calcium at 01/11/17 1729  . carbamazepine (TEGRETOL XR) 12 hr tablet 200 mg  200 mg Oral QHS Burgess Estelle, MD   200 mg at 01/11/17 2122  .  gabapentin (NEURONTIN) capsule 300 mg  300 mg Oral QHS Burgess Estelle, MD   300 mg at 01/11/17 2122  . heparin ADULT infusion 100 units/mL (25000 units/272mL sodium chloride 0.45%)  1,050 Units/hr Intravenous Continuous Erenest Blank, RPH 12 mL/hr at 01/11/17 1947 1,200 Units/hr at 01/11/17 1947  . heparin injection 1,000 Units  1,000 Units Dialysis PRN Penninger, Ria Comment, PA      . heparin injection 1,700 Units  20 Units/kg Dialysis PRN Penninger, Ria Comment, PA      . insulin aspart (novoLOG) injection 0-7 Units  0-7 Units Subcutaneous TID WC Santos-Sanchez, Idalys, MD      . insulin aspart (novoLOG) injection 3 Units  3 Units Subcutaneous TID WC Santos-Sanchez, Idalys, MD      . insulin glargine (LANTUS) injection 10 Units  10 Units Subcutaneous Daily Maryellen Pile, MD   10 Units at 01/10/17 1354  . labetalol (NORMODYNE) tablet 300 mg  300 mg Oral BID Burgess Estelle, MD   300 mg at 01/11/17 2122  . lidocaine (PF) (XYLOCAINE) 1 % injection 5 mL  5 mL Intradermal PRN Penninger, Ria Comment, PA      . lidocaine-prilocaine (EMLA) cream 1 application  1 application Topical PRN Penninger, Ria Comment, PA      . metoCLOPramide (REGLAN) tablet 5 mg  5 mg Oral  TID AC Bartholomew Crews, MD   5 mg at 01/12/17 1115  . pentafluoroprop-tetrafluoroeth (GEBAUERS) aerosol 1 application  1 application Topical PRN Penninger, Ria Comment, PA      . promethazine (PHENERGAN) tablet 12.5 mg  12.5 mg Oral Q6H PRN Burgess Estelle, MD   12.5 mg at 01/12/17 1028  . ramelteon (ROZEREM) tablet 8 mg  8 mg Oral QHS Chundi, Vahini, MD   8 mg at 01/11/17 2124  . senna-docusate (Senokot-S) tablet 1 tablet  1 tablet Oral QHS PRN Burgess Estelle, MD      . sevelamer carbonate (RENVELA) tablet 800 mg  800 mg Oral TID WC Burgess Estelle, MD   800 mg at 01/11/17 1729  . sodium chloride 0.9 % bolus 500 mL  500 mL Intravenous Once Carlota Raspberry, Tiffany, PA-C      . sodium chloride flush (NS) 0.9 % injection 3 mL  3 mL Intravenous Q12H Burgess Estelle,  MD   3 mL at 01/12/17 0428    REVIEW OF SYSTEMS:  [X]  denotes positive finding, [ ]  denotes negative finding Cardiac  Comments:  Chest pain or chest pressure:    Shortness of breath upon exertion:    Short of breath when lying flat:    Irregular heart rhythm:    Constitutional    Fever or chills:     PHYSICAL EXAM:   Vitals:   01/12/17 0800 01/12/17 0900 01/12/17 0930 01/12/17 1000  BP: 137/81  133/70 131/74  Pulse: 85 62 77 82  Resp: 12 11 11 14   Temp:      TempSrc:      SpO2:      Weight:      Height:        GENERAL: The patient is a well-nourished female, in no acute distress. The vital signs are documented above. CARDIOVASCULAR: There is a regular rate and rhythm. PULMONARY: There is good air exchange bilaterally without wheezing or rales. Palpable left radial pulse. Audible bruit in left upper arm fistula.  DATA:   ECHO (TEE) yesterday showed a large, 2.8 cm x 1.7 cm, spherical fixed mass just below the insertion of the inferior vena cava. There was mobile thrombus associated with this distally. Based on these findings the patient is being anticoagulated. She is on intravenous heparin.  MEDICAL ISSUES:   STATUS POST LEFT FIRST STAGE BASILIC VEIN TRANSPOSITION: The patient has not had a duplex since June. I will obtain a duplex of the first stage basilic vein transposition to help determine if she is a candidate for a second stage. If not she could have an AV graft placed. Her surgery has been moved up to Monday given that she will need long-term anticoagulation.  Deitra Mayo [facility] [place] [phone]

## 2017-01-12 NOTE — Progress Notes (Signed)
Spoke with patients nurse Cloyde Reams RN and notified that it is against best practice and patient safety to place IV site in LE. Markham Jordan RN to contact physician for further guidance on finding vascular access. Nurse VU. Fran Lowes, RN VAST

## 2017-01-12 NOTE — Progress Notes (Signed)
IV team called, refuse to place IV in leg and recommended a temporary PICC line. MD notified and aware. MD stated they would look at chart and call back. Will continue to monitor.

## 2017-01-12 NOTE — Procedures (Signed)
I was present at this dialysis session. I have reviewed the session itself and made appropriate changes.   Filed Weights   01/11/17 1054 01/11/17 2218 01/12/17 0727  Weight: 86.2 kg (190 lb) 88 kg (194 lb) 88.1 kg (194 lb 3.6 oz)     Recent Labs Lab 01/12/17 0459  NA 131*  K 4.2  CL 91*  CO2 25  GLUCOSE 163*  BUN 52*  CREATININE 8.27*  CALCIUM 9.1  PHOS 7.4*     Recent Labs Lab 01/09/17 1409 01/10/17 0233 01/11/17 1834 01/12/17 0459  WBC 7.1 5.9 5.6 6.7  NEUTROABS 4.8  --   --   --   HGB 10.6* 9.7* 10.5* 10.5*  HCT 32.4* 29.7* 32.0* 31.7*  MCV 92.8 92.5 91.4 90.8  PLT 261 234 259 268    Scheduled Meds: . calcitRIOL  0.25 mcg Oral Q M,W,F-HD  . calcium carbonate  1 tablet Oral TID WC  . carbamazepine  200 mg Oral QHS  . gabapentin  300 mg Oral QHS  . insulin aspart  0-9 Units Subcutaneous TID WC  . insulin glargine  10 Units Subcutaneous Daily  . labetalol  300 mg Oral BID  . metoCLOPramide  5 mg Oral TID AC  . ramelteon  8 mg Oral QHS  . sevelamer carbonate  800 mg Oral TID WC  . sodium chloride flush  3 mL Intravenous Q12H   Continuous Infusions: . sodium chloride    . sodium chloride    . heparin 1,200 Units/hr (01/11/17 1947)  . sodium chloride     PRN Meds:.sodium chloride, sodium chloride, acetaminophen **OR** acetaminophen, alteplase, heparin, heparin, lidocaine (PF), lidocaine-prilocaine, pentafluoroprop-tetrafluoroeth, promethazine, senna-docusate    Assessment and Plan: 1. 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  Will need to start on anticoagulation and will eventually have HD cath removed but will also need AVG placement asap since her avf has failed to mature. 2. ESRD- continue with HD qMWF 3. DM per primary 4. Disposition- on heparin and will need oral anticoagulation   Donetta Potts,  MD 01/12/2017, 8:32 AM

## 2017-01-12 NOTE — Progress Notes (Signed)
TCTS BRIEF PROGRESS NOTE   I agree w/ plans for anticoagulation.  I have reviewed the patient's TEE with 2 of my partners who both agree the right atrial mass is likely clot, and on some images it looks as though it may still be attached to the dialysis catheter.  CTA chest not done.  I think it might be wise to make sure that patient doesn't have subclinical PE, as this might affect how long patient should remain on Coumadin.  Will defer to Dr Percival Spanish and Dr Marval Regal.  Please call if we can be of further assistance.  Rexene Alberts, MD 01/12/2017 5:08 PM

## 2017-01-12 NOTE — Progress Notes (Signed)
IV team stated they tried to get 20 gauge IV in for CT angiogram in patients arm, but were unsuccessful. MD notified.

## 2017-01-12 NOTE — Progress Notes (Signed)
ANTICOAGULATION CONSULT NOTE - Follow Up Consult  Pharmacy Consult for Heparin  Indication: Rule out PE, right atrial mass with possible clot  Allergies  Allergen Reactions  . No Known Allergies     Patient Measurements: Height: 5\' 8"  (172.7 cm) Weight: 189 lb 13.1 oz (86.1 kg) IBW/kg (Calculated) : 63.9  Vital Signs: Temp: 98.3 F (36.8 C) (09/28 1231) Temp Source: Oral (09/28 1231) BP: 129/72 (09/28 1231) Pulse Rate: 83 (09/28 1231)  Labs:  Recent Labs  01/10/17 0233 01/11/17 1330 01/11/17 1834 01/12/17 0459 01/12/17 1429  HGB 9.7*  --  10.5* 10.5*  --   HCT 29.7*  --  32.0* 31.7*  --   PLT 234  --  259 268  --   LABPROT  --   --   --   --  13.4  INR  --   --   --   --  1.03  HEPARINUNFRC  --   --   --  0.92* 0.98*  CREATININE 8.58* 7.14*  --  8.27*  --     Estimated Creatinine Clearance: 11 mL/min (A) (by C-G formula based on SCr of 8.27 mg/dL (H)).  Assessment: 19 YOF with ESRD on HD who was found to have right atrial mass with possible clot. To get CTA to r/o PE.  Heparin level elevated this morning and rate was decreased to 1050 units/hr. Heparin level this afternoon is still elevated at 0.98units/mL.  hgb 10.5, plts 259- stable, no bleeding noted.  Spoke with vascular surgery- since they are taking her for procedure on Monday, they have asked pharmacy to hold off on starting warfarin. Consult has been discontinued.  Goal of Therapy:  Heparin level 0.3-0.7 units/ml Monitor platelets by anticoagulation protocol: Yes   Plan:  Dec heparin to 950 units/hr Heparin level at midnight Daily heparin level and CBC Follow long term anticoagulation plans post-op   Camran Keady D. Jorel Gravlin, PharmD, BCPS Clinical Pharmacist Pager: [phone] Clinical Phone for 01/12/2017 until 3:30pm: [phone] If after 3:30pm, please call main pharmacy at [phone] 01/12/2017 3:30 PM

## 2017-01-12 NOTE — Progress Notes (Signed)
   Subjective:  No acute events overnight. TEE yesterday showed large right atrial mass with associated mobile thrombus as well as HD catheter in right atrium. This morning patient was in HD when seen. States she continues to have intermittent palpitations that last seconds. Denied chest pain and shortness of breath. No complaints this morning.   Objective:  Vital signs in last 24 hours: Vitals:   01/11/17 1748 01/11/17 2020 01/11/17 2218 01/12/17 0357  BP: (!) 149/89 (!) 148/97  (!) 167/95  Pulse: 90 93  85  Resp: 18 18  17   Temp: 98.3 F (36.8 C) 99 F (37.2 C)  98.1 F (36.7 C)  TempSrc: Oral     SpO2: 100% 97%  100%  Weight:   194 lb (88 kg)   Height:        General: pleasant female, well-developed, well-nourished, sleeping in bed in no acute distress  Cardiac: regular rate and rhythm, nl S1/S2, no murmurs, rubs or gallops  Pulm: CTAB, no wheezes or crackles, no increased work of breathing  Abd: soft, non-tender, non-distended  Neuro: A&Ox3, able to move all 4 extremities, no focal deficits noted    Assessment/Plan:  Carrie [name] a 36 year old female with history of ESRD 2/2 DM and HTN on HD through HD cath, uncontrolled [id number] complicated by peripheral neuropathy and gastroparesis, HTN, and trigeminal neuralgia who presents for evaluation of right atrial mass found during workup for renal transplant.   # Right atrial mass: No known cardiac history. Found during workup for renal transplant. Patient remains hemodynamically stable. She reports intermittent palpitations that last seconds but no other cardiac/ischemic symptoms. TEE yesterday showed large right atrial mass (presumed to be a thrombus vs myxoma) with associated mobile thrombus as well as HD catheter in right atrium. She was started on heparin gtt and warfarin and per cardiology will need HD cath exchange. Per nephrology, patient will also need AVG placement as soon as possible due to failure to mature of AVF.  - Per  cards: heparin gtt + warfarin (not started as of today given need for HD cath exchange). They will plan to re-image in 2 weeks.  - CTA chest to r/o PE - Will need anticoagulation for 3 months with follow-up TEE vs surgical extraction vs vacuum extraction   # ESRD: secondary to T1DM and HTN, on HD MWF. HD cath placed on 08/2016. She will need exchanged due to suspected intra-cardiac thrombus.   - Nephrology following, HD today   # T1DM: uncontrolled, c/b peripheral neuropathy and gastroparesis. CBGs improving. This morning her CBG was 163. Will continue current dose of Lantus. - SQ Lantus 10U qAM + Novolog 3U TID with meals  - SSI-S (customized)  - Continue home gabapentin 300 MG QHS - Continue home antiemetics: reglan, zofran, and phenergan   # HTN: Per patient, home labetalol recently decreased from TID to BID. She seems to be normotensive after HD sessions.  - Continue home labetalol 300 mg BID   # Trigeminal neuralgia:  - Continue home carbamazepine 200 mg QHS   F: none E: will continue to monitor  N: Renal + CM diet   VTE ppx: Heparin gtt   Code status: Full code, confirmed on admission   Dispo: Anticipated discharge in approximately 1-2 day(s) pending cardiac workup.   Welford Roche, MD  Internal Medicine PGY-1  P [phone]

## 2017-01-12 NOTE — Progress Notes (Addendum)
Progress Note  Patient Name: Carrie [name] Date of Encounter: 01/12/2017  Primary Cardiologist: New  Subjective   No chest pain.  No N/V  Inpatient Medications    Scheduled Meds: . calcitRIOL  0.25 mcg Oral Q M,W,F-HD  . calcium carbonate  1 tablet Oral TID WC  . carbamazepine  200 mg Oral QHS  . gabapentin  300 mg Oral QHS  . insulin aspart  0-9 Units Subcutaneous TID WC  . insulin glargine  10 Units Subcutaneous Daily  . labetalol  300 mg Oral BID  . metoCLOPramide  5 mg Oral TID AC  . ramelteon  8 mg Oral QHS  . sevelamer carbonate  800 mg Oral TID WC  . sodium chloride flush  3 mL Intravenous Q12H   Continuous Infusions: . sodium chloride    . sodium chloride    . heparin 1,200 Units/hr (01/11/17 1947)  . sodium chloride     PRN Meds: sodium chloride, sodium chloride, acetaminophen **OR** acetaminophen, alteplase, heparin, heparin, lidocaine (PF), lidocaine-prilocaine, pentafluoroprop-tetrafluoroeth, promethazine, senna-docusate   Vital Signs    Vitals:   01/11/17 1748 01/11/17 2020 01/11/17 2218 01/12/17 0357  BP: (!) 149/89 (!) 148/97  (!) 167/95  Pulse: 90 93  85  Resp: 18 18  17   Temp: 98.3 F (36.8 C) 99 F (37.2 C)  98.1 F (36.7 C)  TempSrc: Oral     SpO2: 100% 97%  100%  Weight:   194 lb (88 kg)   Height:        Intake/Output Summary (Last 24 hours) at 01/12/17 0609 Last data filed at 01/12/17 0200  Gross per 24 hour  Intake              326 ml  Output                0 ml  Net              326 ml   Filed Weights   01/10/17 2339 01/11/17 1054 01/11/17 2218  Weight: 190 lb (86.2 kg) 190 lb (86.2 kg) 194 lb (88 kg)    Telemetry    NA - Personally Reviewed  ECG    NA - Personally Reviewed  Physical Exam   GEN: No  acute distress.   Neck: No  JVD Cardiac: RRR, no murmurs, rubs, or gallops.  Respiratory: Clear   to auscultation bilaterally. GI: Soft, nontender, non-distended, normal bowel sounds  MS:   edema; No  deformity. Neuro:   Nonfocal  Psych: Oriented and appropriate    Labs    Chemistry  Recent Labs Lab 01/09/17 1409 01/10/17 0233 01/11/17 1330 01/12/17 0459  NA 132* 135 131* 131*  K 5.1 4.2 5.1 4.2  CL 91* 96* 95* 91*  CO2 27 26 20* 25  GLUCOSE 309* 96 208* 163*  BUN 55* 60* 41* 52*  CREATININE 7.73* 8.58* 7.14* 8.27*  CALCIUM 8.7* 8.7* 8.9 9.1  PROT 7.1 6.4*  --   --   ALBUMIN 3.5 3.1* 2.9* 3.6  AST 14* 12*  --   --   ALT 13* 10*  --   --   ALKPHOS 77 68  --   --   BILITOT 0.8 0.6  --   --   GFRNONAA 6* 5* 7* 6*  GFRAA 7* 6* 8* 7*  ANIONGAP 14 13 16* 15     Hematology  Recent Labs Lab 01/10/17 0233 01/11/17 1834 01/12/17 0459  WBC 5.9 5.6 6.7  RBC 3.21* 3.50* 3.49*  HGB 9.7* 10.5* 10.5*  HCT 29.7* 32.0* 31.7*  MCV 92.5 91.4 90.8  MCH 30.2 30.0 30.1  MCHC 32.7 32.8 33.1  RDW 16.5* 16.4* 16.2*  PLT 234 259 268    Cardiac EnzymesNo results for input(s): TROPONINI in the last 168 hours. No results for input(s): TROPIPOC in the last 168 hours.   BNPNo results for input(s): BNP, PROBNP in the last 168 hours.   DDimer No results for input(s): DDIMER in the last 168 hours.   Radiology    No results found.  Cardiac Studies   ECHO:  Study Conclusions  - Left ventricle: The cavity size was normal. There was severe   concentric hypertrophy. Systolic function was vigorous. The   estimated ejection fraction was in the range of 65% to 70%. - Aortic valve: No evidence of vegetation. - Mitral valve: No evidence of vegetation. - Left atrium: No evidence of thrombus in the appendage. - Right atrium: There was a large, 2.8 cm (L) x 1.7 cm (W),   spherical, fixed mass; based on its appearance, myxoma cannot be   excluded. It is just below the insertion of the IVC (IVC is   clear/patent). There is mobile thrombus assosciated distally with   mass. - Tricuspid valve: No evidence of vegetation. - Pulmonic valve: No evidence of vegetation. - Superior vena cava:  The study excluded a thrombus.   Patient Profile     36 y.o. female without prior cardiac history who is admitted for evaluation of a right atrial mass.  This was found at [facility] when she had TTE as part of a renal transplant evaluation.    Assessment & Plan    RA mass:    Dr. Ricard Dillon note is appreciated.  I reviewed the images with Dr. Johnsie Cancel.  He feels that the mass is clot rather than myxoma.  Heparin has been started.  Warfarin started.  We will image again in a couple of weeks.   Signed, Minus Breeding, MD  01/12/2017, 6:09 AM

## 2017-01-13 ENCOUNTER — Encounter (HOSPITAL_COMMUNITY): Payer: BLUE CROSS/BLUE SHIELD

## 2017-01-13 DIAGNOSIS — Z992 Dependence on renal dialysis: Secondary | ICD-10-CM

## 2017-01-13 DIAGNOSIS — N186 End stage renal disease: Secondary | ICD-10-CM

## 2017-01-13 LAB — CBC
HCT: 28.4 % — ABNORMAL LOW (ref 36.0–46.0)
Hemoglobin: 9.6 g/dL — ABNORMAL LOW (ref 12.0–15.0)
MCH: 30.7 pg (ref 26.0–34.0)
MCHC: 33.8 g/dL (ref 30.0–36.0)
MCV: 90.7 fL (ref 78.0–100.0)
PLATELETS: 211 10*3/uL (ref 150–400)
RBC: 3.13 MIL/uL — ABNORMAL LOW (ref 3.87–5.11)
RDW: 16.3 % — AB (ref 11.5–15.5)
WBC: 5.9 10*3/uL (ref 4.0–10.5)

## 2017-01-13 LAB — GLUCOSE, CAPILLARY
GLUCOSE-CAPILLARY: 179 mg/dL — AB (ref 65–99)
GLUCOSE-CAPILLARY: 266 mg/dL — AB (ref 65–99)
Glucose-Capillary: 118 mg/dL — ABNORMAL HIGH (ref 65–99)
Glucose-Capillary: 122 mg/dL — ABNORMAL HIGH (ref 65–99)
Glucose-Capillary: 188 mg/dL — ABNORMAL HIGH (ref 65–99)
Glucose-Capillary: 204 mg/dL — ABNORMAL HIGH (ref 65–99)

## 2017-01-13 LAB — HEPARIN LEVEL (UNFRACTIONATED)
Heparin Unfractionated: 0.5 IU/mL (ref 0.30–0.70)
Heparin Unfractionated: 0.55 IU/mL (ref 0.30–0.70)

## 2017-01-13 NOTE — Progress Notes (Signed)
Spoke with RN regarding need for nephrology to approve PICC order.

## 2017-01-13 NOTE — Progress Notes (Signed)
[facility] Progress Note   Dialysis Orders: MWF -[facility]  4hrs BFR 400, DFR 800, EDW 84kg, 2K/2Ca TDC, L AVF maturing  Heparin 2500U IV bolus Calcitriol 0.68mcg PO qHD  Last labs: 9/19: hgb 11.1, 9/12: Ca 9.8, P 5.7, 8/22: TSAT 31%, Alb 2.8, PTH 282  Assessment/Plan: 1.  R atrial echodensity mass             -TEE on 9/28 mass consistent with thrombus likely related to [facility]. Heparin gtt started per pharmacy        -Cardiology following  -Will eventually need to have [place] removed. S/p 1st stage BVT 09/06/16. VVS planning 2nd stage BVT vs AVG Monday 2.  ESRD - MWF           - Continue on schedule, Next HD Monday  3.  Hypertension/volume               -BP stable, continue labetalol, hold pre HD.             -Continue to titrate down volume.  -Not to EDW yet. Post HD wt Friday 86.1kg net UF 2L  4.  Anemia  - Hgb trending down, 10.6>9.7, monitor. May need ESA if continues to follow trend.  5.  Metabolic bone disease -             -Ca, P and PTH with in goal.  Continue TUMS, and calcitriol 0.73mcg qHD.   6.  Nutrition - Nepro TID, Renavite, and Renal diet.  7. Type 1 DM - on insulin, managed by PCP 8. Gastroparesis  -N/V most of the day yesterday. Improved today.  -On Reglan, Phenergan and Zofran   Lynnda Child PA-C [facility] Pager [phone] 01/13/2017,10:09 AM    Subjective:    Seen in room. Frustrated by difficult needle sticks. Nauseated on HD. On heparin gtt for thrombus in R atrium. Dr. Scot Dock planning access surgery Monday.    Objective Vitals:   01/12/17 2023 01/13/17 0429 01/13/17 0912 01/13/17 1006  BP: 134/78 (!) 149/83 (!) 157/56   Pulse: 88 83 84   Resp: 16 16  17   Temp: 98.6 F (37 C) 98.2 F (36.8 C)  98.2 F (36.8 C)  TempSrc: Oral Oral  Oral  SpO2: 100% 100%  100%  Weight:      Height:        Intake/Output Summary (Last 24 hours) at 01/13/17 1009 Last data filed at 01/13/17 0915  Gross per 24 hour  Intake           1309.56 ml  Output             2000 ml  Net          -690.44 ml   Physical Exam General: NAD, pleasant female Heart:RRR, no m/r/g Lungs:CTA b/l Abdomen:soft, NT, no masses Extremities: trace non pitting edema in b/l ankles/feet Dialysis Access: TDC, LU AVF maturing, +thrill, +bruit   Additional Objective Labs: Basic Metabolic Panel:  Recent Labs Lab 01/10/17 0233 01/11/17 1330 01/12/17 0459  NA 135 131* 131*  K 4.2 5.1 4.2  CL 96* 95* 91*  CO2 26 20* 25  GLUCOSE 96 208* 163*  BUN 60* 41* 52*  CREATININE 8.58* 7.14* 8.27*  CALCIUM 8.7* 8.9 9.1  PHOS  --  7.2* 7.4*   Liver Function Tests:  Recent Labs Lab 01/09/17 1409 01/10/17 0233 01/11/17 1330 01/12/17 0459  AST 14* 12*  --   --   ALT 13* 10*  --   --  ALKPHOS 77 68  --   --   BILITOT 0.8 0.6  --   --   PROT 7.1 6.4*  --   --   ALBUMIN 3.5 3.1* 2.9* 3.6   No results for input(s): LIPASE, AMYLASE in the last 168 hours. CBC:  Recent Labs Lab 01/09/17 1409 01/10/17 0233 01/11/17 1834 01/12/17 0459 01/13/17 0620  WBC 7.1 5.9 5.6 6.7 5.9  NEUTROABS 4.8  --   --   --   --   HGB 10.6* 9.7* 10.5* 10.5* 9.6*  HCT 32.4* 29.7* 32.0* 31.7* 28.4*  MCV 92.8 92.5 91.4 90.8 90.7  PLT 261 234 259 268 211   CBG:  Recent Labs Lab 01/12/17 1606 01/12/17 2021 01/13/17 0013 01/13/17 0427 01/13/17 0802  GLUCAP 253* 259* 204* 122* 118*    Lab Results  Component Value Date   INR 1.03 01/12/2017   INR 1.03 01/09/2017   INR 0.98 09/07/2016   Studies/Results: No results found. Medications: . sodium chloride    . sodium chloride    . heparin 950 Units/hr (01/12/17 2039)  . sodium chloride     . calcitRIOL  0.25 mcg Oral Q M,W,F-HD  . calcium carbonate  1 tablet Oral TID WC  . carbamazepine  200 mg Oral QHS  . gabapentin  300 mg Oral QHS  . insulin aspart  0-7 Units Subcutaneous TID WC  . insulin aspart  3 Units Subcutaneous TID WC  . insulin glargine  10 Units Subcutaneous Daily  . labetalol   300 mg Oral BID  . metoCLOPramide  5 mg Oral TID AC  . ramelteon  8 mg Oral QHS  . sevelamer carbonate  800 mg Oral TID WC  . sodium chloride flush  3 mL Intravenous Q12H    I have seen and examined this patient and agree with plan and assessment in the above note with renal recommendations/intervention highlighted.  CT surgery has evaluated pt and recommended CT angio to r/o PE to determine length of coumadin therapy.  Would likely need anticoagulation for at least a few months before pulling the HD catheter since it appears to be attached.  Appreciate VVS assistance and plan for either 2nd stage BVT vs AVG placement on Monday.  Will also need input on duration of anticoagulation prior to pulling HD catheter. Broadus John A Eltha Tingley,MD 01/13/2017 1:00 PM

## 2017-01-13 NOTE — Progress Notes (Signed)
Spoke with Vanessa Volente, PA-C concerning PICC order. Will not place PICC. Recommendations are to ask IR to place PICC in IJ or or subclavian vein to preserve arm veins for AVF or AVG. RN notified and ask to contact MD ordering PICC. Will continue to monitor.

## 2017-01-13 NOTE — Progress Notes (Signed)
I called MD and MD stated that she talked to Moshe Cipro, MD who stated that it was ok for patient to have PICC line placed. MD notified of Vanessa Leonia, PA's note which stated that that IR could place a PICC in the IJ or subclavian vein to preserve arm. MD stated that she would discuss this with her team members. Will continue to monitor.

## 2017-01-13 NOTE — Progress Notes (Signed)
Patient is requesting that labs be drawn another method than by venipuncture D/T increased pain. Patient has poor vasculature and is a difficult stick. MD notified and made aware of this information. MD stated that she would look into other options. Will continue to monitor.

## 2017-01-13 NOTE — Progress Notes (Signed)
IMTS has been attempting to obtain CTA chest to r/o subclinical PE for 2 days per cardiology recommendations. However, IV team unable to obtain IV access for IV contrast and recommended placement of temporary PICC line. Spoke to nephrology on call today 9/29, Dr. Moshe Cipro, who agreed with temporary PICC line for CTA. RN notified and contacted IV team to inform them. IV team spoke to nephrology PA, Vanessa Roberta, PA-C, who recommended consult to IR for placement of PICC in IJ or subclavian. Dr. Tiburcio Pea called IV team and notified that nephrologist on call agreed with temporary PICC line. IV team stated will place PICC line tomorrow.

## 2017-01-13 NOTE — Progress Notes (Signed)
MD notified regarding approval from nephrology for PICC line placement. MD stated that nephrology approved placement of PICC line. IV team notified of this information. Will continue to monitor.

## 2017-01-13 NOTE — Progress Notes (Signed)
ANTICOAGULATION CONSULT NOTE - Follow Up Consult  Pharmacy Consult for Heparin  Indication: Rule out PE, right atrial mass with possible clot  Allergies  Allergen Reactions  . No Known Allergies     Patient Measurements: Height: 5\' 8"  (172.7 cm) Weight: 189 lb 13.1 oz (86.1 kg) IBW/kg (Calculated) : 63.9  Vital Signs: Temp: 98.6 F (37 C) (09/28 2023) Temp Source: Oral (09/28 2023) BP: 134/78 (09/28 2023) Pulse Rate: 88 (09/28 2023)  Labs:  Recent Labs  01/10/17 0233 01/11/17 1330 01/11/17 1834 01/12/17 0459 01/12/17 1429 01/12/17 2331  HGB 9.7*  --  10.5* 10.5*  --   --   HCT 29.7*  --  32.0* 31.7*  --   --   PLT 234  --  259 268  --   --   LABPROT  --   --   --   --  13.4  --   INR  --   --   --   --  1.03  --   HEPARINUNFRC  --   --   --  0.92* 0.98* 0.55  CREATININE 8.58* 7.14*  --  8.27*  --   --     Estimated Creatinine Clearance: 11 mL/min (A) (by C-G formula based on SCr of 8.27 mg/dL (H)).  Assessment: On heparin for rule out PE and right atrial mass with possible clot, heparin level is therapeutic x 1 this AM after rate decrease.  Goal of Therapy:  Heparin level 0.3-0.7 units/ml Monitor platelets by anticoagulation protocol: Yes   Plan:  Cont heparin at 950 units/hr HL with AM labs  Narda Bonds 01/13/2017,1:08 AM

## 2017-01-13 NOTE — Progress Notes (Signed)
   VASCULAR SURGERY ASSESSMENT & PLAN:   Duplex of left 1st stage basilic vein transposition is pending.   For 2nd stage basilic vein transposition or left AVG Monday.  Coumadin on hold.   SUBJECTIVE:   No complaints.   PHYSICAL EXAM:   Vitals:   01/12/17 2023 01/13/17 0429 01/13/17 0912 01/13/17 1006  BP: 134/78 (!) 149/83 (!) 157/56   Pulse: 88 83 84   Resp: 16 16  17   Temp: 98.6 F (37 C) 98.2 F (36.8 C)  98.2 F (36.8 C)  TempSrc: Oral Oral  Oral  SpO2: 100% 100%  100%  Weight:      Height:       Palpable thrill in left BVT  LABS:   Lab Results  Component Value Date   WBC 5.9 01/13/2017   HGB 9.6 (L) 01/13/2017   HCT 28.4 (L) 01/13/2017   MCV 90.7 01/13/2017   PLT 211 01/13/2017   Lab Results  Component Value Date   CREATININE 8.27 (H) 01/12/2017   Lab Results  Component Value Date   INR 1.03 01/12/2017   CBG (last 3)   Recent Labs  01/13/17 0013 01/13/17 0427 01/13/17 0802  GLUCAP 204* 122* 118*    PROBLEM LIST:    Principal Problem:   Right atrial mass   CURRENT MEDS:   . calcitRIOL  0.25 mcg Oral Q M,W,F-HD  . calcium carbonate  1 tablet Oral TID WC  . carbamazepine  200 mg Oral QHS  . gabapentin  300 mg Oral QHS  . insulin aspart  0-7 Units Subcutaneous TID WC  . insulin aspart  3 Units Subcutaneous TID WC  . insulin glargine  10 Units Subcutaneous Daily  . labetalol  300 mg Oral BID  . metoCLOPramide  5 mg Oral TID AC  . ramelteon  8 mg Oral QHS  . sevelamer carbonate  800 mg Oral TID WC  . sodium chloride flush  3 mL Intravenous Q12H    Gae Gallop Beeper: [phone] Office: [phone] 01/13/2017

## 2017-01-13 NOTE — Progress Notes (Signed)
Dr Cherlyn Cushing rounding note reviewed, no additional cardiology recs at this time. Call over the weekend with questions   Carlyle Dolly MD

## 2017-01-13 NOTE — Progress Notes (Signed)
ANTICOAGULATION CONSULT NOTE - Follow Up Consult  Pharmacy Consult for Heparin  Indication: Rule out PE, right atrial mass with possible clot  Allergies  Allergen Reactions  . No Known Allergies     Patient Measurements: Height: 5\' 8"  (172.7 cm) Weight: 189 lb 13.1 oz (86.1 kg) IBW/kg (Calculated) : 63.9  Vital Signs: Temp: 98.2 F (36.8 C) (09/29 1006) Temp Source: Oral (09/29 1006) BP: 157/56 (09/29 0912) Pulse Rate: 84 (09/29 0912)  Labs:  Recent Labs  01/11/17 1330  01/11/17 1834  01/12/17 0459 01/12/17 1429 01/12/17 2331 01/13/17 0620  HGB  --   < > 10.5*  --  10.5*  --   --  9.6*  HCT  --   --  32.0*  --  31.7*  --   --  28.4*  PLT  --   --  259  --  268  --   --  211  LABPROT  --   --   --   --   --  13.4  --   --   INR  --   --   --   --   --  1.03  --   --   HEPARINUNFRC  --   --   --   < > 0.92* 0.98* 0.55 0.50  CREATININE 7.14*  --   --   --  8.27*  --   --   --   < > = values in this interval not displayed.  Estimated Creatinine Clearance: 11 mL/min (A) (by C-G formula based on SCr of 8.27 mg/dL (H)).  Assessment: 38 YOF with ESRD on HD who was found to have right atrial mass with possible clot. CVTS recommends getting CTA to r/o subclinical PE, but this has not yet been done.  Heparin level now therapeutic x2 on heparin infusion at 950 units/hr. Slight decreased in hgb and plts, but no bleeding noted. Will continue to watch.  Holding off on starting warfarin d/t conversation this pharmacist had with Dr. Scot Dock yesterday. Patient is going for 2nd stage BVT vs AVG placement on Monday 10/1- long term anticoagulation can start after that per vascular's preference.  Goal of Therapy:  Heparin level 0.3-0.7 units/ml Monitor platelets by anticoagulation protocol: Yes   Plan:  Continue heparin at 950 units/hr Daily heparin level and CBC Follow long term anticoagulation plans post-op including what CTA shows to help determine length of therapy for  anticoagulation   Lilja Soland D. Majestic Brister, PharmD, BCPS Clinical Pharmacist Pager: [phone] Clinical Phone for 01/13/2017 until 3:30pm: [phone] If after 3:30pm, please call main pharmacy at [phone] 01/13/2017 10:33 AM

## 2017-01-13 NOTE — Progress Notes (Signed)
Subjective:  No acute events overnight. Patient doing well this morning with no complaints. She was unable to obtain CTA yesterday due to no IV access for contrast. IV team unable to place IV and recommended temporary PICC line. She remains on a heparin drip with plan for surgery on 10/1.  Objective:  Vital signs in last 24 hours: Vitals:   01/12/17 1231 01/12/17 1706 01/12/17 2023 01/13/17 0429  BP: 129/72 (!) 141/76 134/78 (!) 149/83  Pulse: 83 84 88 83  Resp: 16 17 16 16   Temp: 98.3 F (36.8 C) 98.3 F (36.8 C) 98.6 F (37 C) 98.2 F (36.8 C)  TempSrc: Oral Oral Oral Oral  SpO2: 100% 99% 100% 100%  Weight:      Height:        General: pleasant female, well-developed, well-nourished, sleeping in bed in no acute distress  Cardiac: regular rate and rhythm, nl S1/S2, no murmurs, rubs or gallops  Pulm: CTAB, no wheezes or crackles, no increased work of breathing  Abd: soft, non-tender, non-distended  Neuro: A&Ox3, able to move all 4 extremities, no focal deficits noted    Assessment/Plan:  Eber Hong. Blixt a 36 year old female with history of ESRD 2/2 DM and HTN on HD through HD cath, uncontrolled U2PN complicated by peripheral neuropathy and gastroparesis, HTN, and trigeminal neuralgia who presents for evaluation of right atrial mass found during workup for renal transplant.   # Right atrial mass: found during renal transplant workup. No known cardiac history. Remains hemodynamically stable. TEE showed large right atrial mass (presumed to be a thrombus) with associated mobile thrombus as well as HD catheter in right atrium. She was started on heparin gtt with plan to start warfarin for 3 months. Cards would like to obtain CTA to r/o subclinical PE in the setting of intracardiac thrombus. Patient has not been able to go CTA due to lack of IV access for contrast. Will discuss with IV team. Per nephrology, patient will also need AVG placement as soon as possible due to failure to  mature of AVF. She is scheduled for surgery 10/1.  - Continue heparin gtt  - Surgery 10/1 for AVF revision vs graft insertion  - CTA chest to r/o PE. Unable to obtain IV access. Will consult nephrology for temporary PICC line for IV contrast.  - Will need anticoagulation with warfarin for 3 months with follow-up TEE vs surgical extraction vs vacuum extraction   # ESRD: secondary to poorly controlled T1DM and HTN, on HD MWF. HD cath placed on 08/2016. Per patient, she was told HD cath can not be exchanged due to belief that thrombus is attached to catheter.  - Nephrology following  # T1DM: uncontrolled, c/b peripheral neuropathy and gastroparesis. CBGs improving. This morning her CBG was 163. Will continue current dose of Lantus. - SQ Lantus 10U qAM + Novolog 3U TID with meals   - SSI-S (customized)  - Continue home gabapentin 300 MG QHS - Continue home antiemetics: reglan, zofran, and phenergan   # HTN: Per patient, home labetalol recently decreased from TID to BID. She seems to be normotensive after HD sessions.  - Continue home labetalol 300 mg BID   # Trigeminal neuralgia:  - Continue home carbamazepine 200 mg QHS   F: none E: will continue to monitor  N: Renal + CM diet   VTE ppx: Heparin gtt   Code status: Full code, confirmed on admission   Dispo: Anticipated discharge in approximately 1-2 day(s) pending cardiac workup.  Welford Roche, MD  Internal Medicine PGY-1  P [phone]

## 2017-01-14 ENCOUNTER — Inpatient Hospital Stay (HOSPITAL_COMMUNITY): Payer: BLUE CROSS/BLUE SHIELD

## 2017-01-14 ENCOUNTER — Encounter (HOSPITAL_COMMUNITY): Payer: Self-pay

## 2017-01-14 DIAGNOSIS — N186 End stage renal disease: Secondary | ICD-10-CM

## 2017-01-14 LAB — GLUCOSE, CAPILLARY
Glucose-Capillary: 158 mg/dL — ABNORMAL HIGH (ref 65–99)
Glucose-Capillary: 169 mg/dL — ABNORMAL HIGH (ref 65–99)
Glucose-Capillary: 239 mg/dL — ABNORMAL HIGH (ref 65–99)
Glucose-Capillary: 273 mg/dL — ABNORMAL HIGH (ref 65–99)
Glucose-Capillary: 292 mg/dL — ABNORMAL HIGH (ref 65–99)

## 2017-01-14 LAB — HEPARIN LEVEL (UNFRACTIONATED): Heparin Unfractionated: 0.33 IU/mL (ref 0.30–0.70)

## 2017-01-14 LAB — CBC
HEMATOCRIT: 28.9 % — AB (ref 36.0–46.0)
HEMOGLOBIN: 9.4 g/dL — AB (ref 12.0–15.0)
MCH: 29.6 pg (ref 26.0–34.0)
MCHC: 32.5 g/dL (ref 30.0–36.0)
MCV: 90.9 fL (ref 78.0–100.0)
PLATELETS: 244 10*3/uL (ref 150–400)
RBC: 3.18 MIL/uL — AB (ref 3.87–5.11)
RDW: 15.7 % — ABNORMAL HIGH (ref 11.5–15.5)
WBC: 5.7 10*3/uL (ref 4.0–10.5)

## 2017-01-14 LAB — SURGICAL PCR SCREEN
MRSA, PCR: NEGATIVE
STAPHYLOCOCCUS AUREUS: NEGATIVE

## 2017-01-14 MED ORDER — IOPAMIDOL (ISOVUE-370) INJECTION 76%
INTRAVENOUS | Status: AC
  Administered 2017-01-14: 80 mL
  Filled 2017-01-14: qty 100

## 2017-01-14 MED ORDER — DICLOFENAC SODIUM 1 % TD GEL
2.0000 g | Freq: Four times a day (QID) | TRANSDERMAL | Status: DC | PRN
  Administered 2017-01-16 – 2017-01-21 (×12): 2 g via TOPICAL
  Filled 2017-01-14: qty 100

## 2017-01-14 MED ORDER — HEPARIN (PORCINE) IN NACL 100-0.45 UNIT/ML-% IJ SOLN
1000.0000 [IU]/h | INTRAMUSCULAR | Status: DC
  Administered 2017-01-14 (×2): 1000 [IU]/h via INTRAVENOUS
  Filled 2017-01-14: qty 250

## 2017-01-14 MED ORDER — CEFAZOLIN SODIUM-DEXTROSE 1-4 GM/50ML-% IV SOLN
1.0000 g | INTRAVENOUS | Status: AC
Start: 2017-01-15 — End: 2017-01-15
  Administered 2017-01-15: 2 g via INTRAVENOUS
  Filled 2017-01-14 (×2): qty 50

## 2017-01-14 MED ORDER — SODIUM CHLORIDE 0.9% FLUSH
10.0000 mL | INTRAVENOUS | Status: DC | PRN
  Administered 2017-01-15 – 2017-01-16 (×5): 10 mL
  Administered 2017-01-18: 20 mL
  Administered 2017-01-19 – 2017-01-23 (×4): 10 mL
  Filled 2017-01-14 (×10): qty 40

## 2017-01-14 NOTE — Progress Notes (Addendum)
   VASCULAR SURGERY ASSESSMENT & PLAN:   Duplex of left 1st stage basilic vein transposition is pending.  The patient is scheduled for 2nd stage basilic vein transposition vs left AVG tomorrow.  Coumadin can be resumed after the procedure (for right atrial mass which is likely clot)  Heparin to be held at 6 AM tomorrow in anticipation of surgery.   Please do not do dialysis in AM as patient is scheduled for surgery in AM. She can dialyze after surgery or Tuesday.    SUBJECTIVE:   Resting comfortably. I did not wake her up.   PHYSICAL EXAM:   Vitals:   01/13/17 0912 01/13/17 1006 01/13/17 2014 01/14/17 0527  BP: (!) 157/56  (!) 144/76 (!) 143/89  Pulse: 84  90 87  Resp:  17 16 16   Temp:  98.2 F (36.8 C) 99 F (37.2 C) 98.1 F (36.7 C)  TempSrc:  Oral Oral Oral  SpO2:  100% 100% 100%  Weight:      Height:       Palpable thrill in left upper arm AVF  LABS:   Lab Results  Component Value Date   WBC 5.7 01/14/2017   HGB 9.4 (L) 01/14/2017   HCT 28.9 (L) 01/14/2017   MCV 90.9 01/14/2017   PLT 244 01/14/2017   Lab Results  Component Value Date   CREATININE 8.27 (H) 01/12/2017   Lab Results  Component Value Date   INR 1.03 01/12/2017   CBG (last 3)   Recent Labs  01/13/17 1751 01/13/17 2013 01/14/17 0014  GLUCAP 179* 266* 292*    PROBLEM LIST:    Principal Problem:   Right atrial mass   CURRENT MEDS:   . calcitRIOL  0.25 mcg Oral Q M,W,F-HD  . calcium carbonate  1 tablet Oral TID WC  . carbamazepine  200 mg Oral QHS  . gabapentin  300 mg Oral QHS  . insulin aspart  0-7 Units Subcutaneous TID WC  . insulin aspart  3 Units Subcutaneous TID WC  . insulin glargine  10 Units Subcutaneous Daily  . labetalol  300 mg Oral BID  . metoCLOPramide  5 mg Oral TID AC  . ramelteon  8 mg Oral QHS  . sevelamer carbonate  800 mg Oral TID WC  . sodium chloride flush  3 mL Intravenous Q12H    Gae Gallop Beeper: [phone] Office:  [phone] 01/14/2017

## 2017-01-14 NOTE — Progress Notes (Signed)
VASCULAR LAB PRELIMINARY  PRELIMINARY  PRELIMINARY  PRELIMINARY   Basilic vein transposition ultrasound completed.     Cauy Melody, RVT 01/14/2017, 12:17 PM

## 2017-01-14 NOTE — Progress Notes (Signed)
ANTICOAGULATION CONSULT NOTE - Follow Up Consult  Pharmacy Consult for Heparin  Indication: Rule out PE, right atrial mass with possible clot  Allergies  Allergen Reactions  . No Known Allergies     Patient Measurements: Height: 5\' 8"  (172.7 cm) Weight: 189 lb 13.1 oz (86.1 kg) IBW/kg (Calculated) : 63.9  Vital Signs: Temp: 98.6 F (37 C) (09/30 0833) Temp Source: Oral (09/30 0833) BP: 159/89 (09/30 0833) Pulse Rate: 84 (09/30 0833)  Labs:  Recent Labs  01/11/17 1330  01/12/17 0459 01/12/17 1429 01/12/17 2331 01/13/17 0620 01/14/17 0237  HGB  --   < > 10.5*  --   --  9.6* 9.4*  HCT  --   < > 31.7*  --   --  28.4* 28.9*  PLT  --   < > 268  --   --  211 244  LABPROT  --   --   --  13.4  --   --   --   INR  --   --   --  1.03  --   --   --   HEPARINUNFRC  --   < > 0.92* 0.98* 0.55 0.50 0.33  CREATININE 7.14*  --  8.27*  --   --   --   --   < > = values in this interval not displayed.  Estimated Creatinine Clearance: 11 mL/min (A) (by C-G formula based on SCr of 8.27 mg/dL (H)).  Assessment: 65 YOF with ESRD on HD who was found to have right atrial mass with possible clot. CVTS recommends getting CTA to r/o subclinical PE, but this has not yet been done.  Heparin level now therapeutic x3 on heparin infusion at 950 units/hr, though level trended down to 0.33 units/mL this morning. Slight decreased in hgb and plts, but no bleeding noted. Will continue to watch.  Patient is going for 2nd stage BVT vs AVG placement on Monday 10/1, therefore warfarin has not yet been started.  Goal of Therapy:  Heparin level 0.3-0.7 units/ml Monitor platelets by anticoagulation protocol: Yes   Plan:  -Increase heparin to 1000 units/hr. Infusion to be stopped at 0600 on 10/1 in anticipation of vascular procedure -Daily heparin level and CBC -Follow restart of anticoagulation after prodedure -Follow long term anticoagulation plans post-op including what CTA shows to help determine  length of therapy for anticoagulation   Gurshaan Matsuoka D. Jamyla Ard, PharmD, BCPS Clinical Pharmacist Pager: [phone] Clinical Phone for 01/14/2017 until 3:30pm: [phone] If after 3:30pm, please call main pharmacy at [phone] 01/14/2017 11:21 AM

## 2017-01-14 NOTE — Progress Notes (Signed)
Subjective:  No acute events overnight. Patient doing well this morning and reports no acute complaints. She now has a midline cath (power glide catheter) in R upper arm for IV contrast for CTA today to r/o PE. Vascular surgery planned for 10/1. Explained that she will have to be in the hospital for a few more days after starting warfarin as it requires several days to became therapeutic. Patient voiced understanding and agreed.   Objective:  Vital signs in last 24 hours: Vitals:   01/13/17 1006 01/13/17 2014 01/14/17 0527 01/14/17 0833  BP:  (!) 144/76 (!) 143/89 (!) 159/89  Pulse:  90 87 84  Resp: 17 16 16 16   Temp: 98.2 F (36.8 C) 99 F (37.2 C) 98.1 F (36.7 C) 98.6 F (37 C)  TempSrc: Oral Oral Oral Oral  SpO2: 100% 100% 100% 100%  Weight:      Height:        General: pleasant female, well-developed, well-nourished, sleeping in bed in no acute distress  Cardiac: regular rate and rhythm, nl S1/S2, no murmurs, rubs or gallops  Abd: soft, non-tender, non-distended  Neuro: A&Ox3, able to move all 4 extremities, no focal deficits noted    Assessment/Plan:  Eber Hong. 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   # Right atrial mass: Found during renal transplant workup. No known cardiac history. Mostly asymptomatic, only reports intermittent palpitations that last seconds. Patient has been hemodynamically stable during this admission. TEE on 9/27 showed large atrial mass that is thought to be a thrombus attached to the tip of HD catheter. CTA chest to r/o PE pending. She was started on heparin gtt with plan to anticoagulate with warfarin for 3 months prior to removing HD cath. However, nephrology would like to confirm duration of anticoagulation  as they would like to remove HD cath as soon as possible (per Dr. Marval Regal who team spoke to during rounds this AM). She currently has a midline catheter for IV contrast load. Per IV team, this line might be able to be use to withdraw labs but very unreliable for withdrawals. She is having surgery tomorrow (10/1) for AVF revision vs new graft insertion (preferred as it would only take 4 weeks to mature).  - Continue heparin gtt with plan to start warfarin postoperatively. Will confirm duration of anticoagulation with CV surgery. She is to remain in the hospital while INR becomes therapeutic. Will need to contact PCP (Dr. Tamala Julian, info in chart) or outpatient dialysis center for INR monitoring. She will also need education (per pharmacy) for coumadin and vitamin K diet.  - Surgery 10/1 for AVF revision vs graft insertion  - CTA chest to r/o PE  - Per CV, will need anticoagulation with warfarin for 3 months with follow-up TEE in 2 weeks vs surgical extraction vs vacuum extraction. Will confirm as above.   # ESRD: secondary to poorly controlled T1DM and HTN, on HD MWF. HD cath placed on 08/2016. Plan for removal in the near future as above.   - Nephrology following  # T1DM: uncontrolled, c/b peripheral neuropathy and gastroparesis. CBGs stable 150s-200s during admission. Suspect patient noncompliant with insulin as home as she was unable to recall doses. She also had an episode of asymptomatic hypoglycemia (CBG 38) when given her home dose (  Lantus 20 qAM) on admission.  - SQ Lantus 10U qAM + Novolog 3U TID with meals   - SSI-S (customized)  - Continue home gabapentin 300 MG QHS - Continue home antiemetics: reglan, zofran, and phenergan   # HTN: Per patient, home labetalol recently decreased from TID to BID. She seems to be normotensive after HD sessions.  - Continue home labetalol 300 mg BID   # Trigeminal neuralgia:  - Continue home carbamazepine 200 mg QHS   F: none E: will continue to  monitor  N: Renal + CM diet --> NPO @ MN for surgery tomorrow   VTE ppx: Heparin gtt   Code status: Full code, confirmed on admission   Dispo: Anticipated discharge in approximately 3-1 day(s).   Welford Roche, MD  Internal Medicine PGY-1  P [phone]

## 2017-01-14 NOTE — Progress Notes (Signed)
[facility] Progress Note  Dialysis Orders: MWF -[facility]  4hrs BFR 400, DFR 800, EDW 84kg, 2K/2Ca TDC, L AVF maturing  Heparin 2500U IV bolus Calcitriol 0.82mcg PO qHD  Last labs: 9/19: hgb 11.1, 9/12: Ca 9.8, P 5.7, 8/22: TSAT 31%, Alb 2.8, PTH 282  Assessment/Plan: 1.  R atrial echodensity mass             -TEE on 9/28 mass consistent with thrombus likely related to [facility]. Heparin gtt started per pharmacy        -Cardiology/CT surg following, recommending CT angio to r/o PE and dertermine length of anticoagulation therapy    -Will eventually need to have TDC removed. Will need input from CT surg to determine when cath can be safely removes as appears to be attached. S/p 1st stage BVT 09/06/16. VVS planning 2nd stage BVT vs AVG Monday 2.  ESRD - MWF           - Continue on schedule, Next HD Monday. Plan after surgery  3.  Hypertension/volume               -BP stable, continue labetalol, hold pre HD.             -Continue to titrate down volume.  -Not to EDW yet. Post HD wt Friday 86.1kg net UF 2L  4.  Anemia  - Hgb trending down, 10.6>9.7, monitor. May need ESA if continues to follow trend.  5.  Metabolic bone disease -             -Ca, P and PTH with in goal.  Continue TUMS, and calcitriol 0.6mcg qHD.   6.  Nutrition - Nepro TID, Renavite, and Renal diet.  7. Type 1 DM - on insulin, managed by PCP 8. Gastroparesis  -N/V most of the day yesterday. Improved today.  -On Reglan, Phenergan and Zofran   Lynnda Child PA-C [facility] Pager [phone] 01/14/2017,10:31 AM    Subjective:    Seen in room. IV RN trying to gain access.  On heparin gtt for thrombus in R atrium. Dr. Scot Dock planning access surgery Monday.    Objective Vitals:   01/13/17 1006 01/13/17 2014 01/14/17 0527 01/14/17 0833  BP:  (!) 144/76 (!) 143/89 (!) 159/89  Pulse:  90 87 84  Resp: 17 16 16 16   Temp: 98.2 F (36.8 C) 99 F (37.2 C) 98.1 F (36.7 C) 98.6 F (37 C)   TempSrc: Oral Oral Oral Oral  SpO2: 100% 100% 100% 100%  Weight:      Height:        Intake/Output Summary (Last 24 hours) at 01/14/17 1031 Last data filed at 01/14/17 0839  Gross per 24 hour  Intake              700 ml  Output                0 ml  Net              700 ml   Physical Exam General: NAD, pleasant female Heart:RRR, no m/r/g Lungs:CTA b/l Abdomen:soft, NT, no masses Extremities: trace non pitting edema in b/l ankles/feet Dialysis Access: TDC, LU AVF maturing, +thrill, +bruit   Additional Objective Labs: Basic Metabolic Panel:  Recent Labs Lab 01/10/17 0233 01/11/17 1330 01/12/17 0459  NA 135 131* 131*  K 4.2 5.1 4.2  CL 96* 95* 91*  CO2 26 20* 25  GLUCOSE 96 208* 163*  BUN 60* 41*  52*  CREATININE 8.58* 7.14* 8.27*  CALCIUM 8.7* 8.9 9.1  PHOS  --  7.2* 7.4*   Liver Function Tests:  Recent Labs Lab 01/09/17 1409 01/10/17 0233 01/11/17 1330 01/12/17 0459  AST 14* 12*  --   --   ALT 13* 10*  --   --   ALKPHOS 77 68  --   --   BILITOT 0.8 0.6  --   --   PROT 7.1 6.4*  --   --   ALBUMIN 3.5 3.1* 2.9* 3.6   No results for input(s): LIPASE, AMYLASE in the last 168 hours. CBC:  Recent Labs Lab 01/09/17 1409 01/10/17 0233 01/11/17 1834 01/12/17 0459 01/13/17 0620 01/14/17 0237  WBC 7.1 5.9 5.6 6.7 5.9 5.7  NEUTROABS 4.8  --   --   --   --   --   HGB 10.6* 9.7* 10.5* 10.5* 9.6* 9.4*  HCT 32.4* 29.7* 32.0* 31.7* 28.4* 28.9*  MCV 92.8 92.5 91.4 90.8 90.7 90.9  PLT 261 234 259 268 211 244   CBG:  Recent Labs Lab 01/13/17 1238 01/13/17 1751 01/13/17 2013 01/14/17 0014 01/14/17 0812  GLUCAP 188* 179* 266* 292* 169*    Lab Results  Component Value Date   INR 1.03 01/12/2017   INR 1.03 01/09/2017   INR 0.98 09/07/2016   Studies/Results: No results found. Medications: . sodium chloride    . sodium chloride    . heparin 950 Units/hr (01/13/17 2249)  . sodium chloride     . calcitRIOL  0.25 mcg Oral Q M,W,F-HD  . calcium  carbonate  1 tablet Oral TID WC  . carbamazepine  200 mg Oral QHS  . gabapentin  300 mg Oral QHS  . insulin aspart  0-7 Units Subcutaneous TID WC  . insulin aspart  3 Units Subcutaneous TID WC  . insulin glargine  10 Units Subcutaneous Daily  . labetalol  300 mg Oral BID  . metoCLOPramide  5 mg Oral TID AC  . ramelteon  8 mg Oral QHS  . sevelamer carbonate  800 mg Oral TID WC  . sodium chloride flush  3 mL Intravenous Q12H    I have seen and examined this patient and agree with plan and assessment in the above note with renal recommendations/intervention highlighted.  Doing well and awaiting CT angio to determine duration of anticoagulation. Broadus John A Steffany Schoenfelder,MD 01/14/2017 11:06 AM

## 2017-01-14 NOTE — Progress Notes (Signed)
IV team called and stated they would do Midline placement today. Notified MD.

## 2017-01-14 NOTE — Progress Notes (Signed)
Spoke with RN re current plan for PICC placement.

## 2017-01-15 ENCOUNTER — Encounter (HOSPITAL_COMMUNITY)
Admission: EM | Disposition: A | Discharge status: Home / Self Care | Payer: Self-pay | Source: Home / Self Care | Attending: Internal Medicine

## 2017-01-15 ENCOUNTER — Inpatient Hospital Stay (HOSPITAL_COMMUNITY): Payer: BLUE CROSS/BLUE SHIELD | Admitting: Anesthesiology

## 2017-01-15 ENCOUNTER — Encounter (HOSPITAL_COMMUNITY): Payer: Self-pay | Admitting: *Deleted

## 2017-01-15 DIAGNOSIS — N185 Chronic kidney disease, stage 5: Secondary | ICD-10-CM

## 2017-01-15 HISTORY — PX: REVISON OF ARTERIOVENOUS FISTULA: SHX6074

## 2017-01-15 LAB — CBC
HEMATOCRIT: 25.4 % — AB (ref 36.0–46.0)
HEMOGLOBIN: 8.6 g/dL — AB (ref 12.0–15.0)
MCH: 30.5 pg (ref 26.0–34.0)
MCHC: 33.9 g/dL (ref 30.0–36.0)
MCV: 90.1 fL (ref 78.0–100.0)
Platelets: 237 10*3/uL (ref 150–400)
RBC: 2.82 MIL/uL — ABNORMAL LOW (ref 3.87–5.11)
RDW: 16 % — AB (ref 11.5–15.5)
WBC: 6.1 10*3/uL (ref 4.0–10.5)

## 2017-01-15 LAB — HCG, SERUM, QUALITATIVE: PREG SERUM: NEGATIVE

## 2017-01-15 LAB — GLUCOSE, CAPILLARY
GLUCOSE-CAPILLARY: 183 mg/dL — AB (ref 65–99)
Glucose-Capillary: 172 mg/dL — ABNORMAL HIGH (ref 65–99)
Glucose-Capillary: 324 mg/dL — ABNORMAL HIGH (ref 65–99)

## 2017-01-15 LAB — POCT I-STAT 4, (NA,K, GLUC, HGB,HCT)
Glucose, Bld: 187 mg/dL — ABNORMAL HIGH (ref 65–99)
HEMATOCRIT: 28 % — AB (ref 36.0–46.0)
Hemoglobin: 9.5 g/dL — ABNORMAL LOW (ref 12.0–15.0)
Potassium: 4.8 mmol/L (ref 3.5–5.1)
SODIUM: 129 mmol/L — AB (ref 135–145)

## 2017-01-15 LAB — HEPARIN LEVEL (UNFRACTIONATED): HEPARIN UNFRACTIONATED: 0.72 [IU]/mL — AB (ref 0.30–0.70)

## 2017-01-15 SURGERY — REVISON OF ARTERIOVENOUS FISTULA
Anesthesia: Monitor Anesthesia Care | Site: Arm Lower | Laterality: Left

## 2017-01-15 MED ORDER — SODIUM CHLORIDE 0.9 % IV SOLN
INTRAVENOUS | Status: DC | PRN
  Administered 2017-01-15: 11:00:00 500 mL

## 2017-01-15 MED ORDER — ONDANSETRON HCL 4 MG/2ML IJ SOLN
INTRAMUSCULAR | Status: DC | PRN
  Administered 2017-01-15: 4 mg via INTRAVENOUS

## 2017-01-15 MED ORDER — CALCIUM CARBONATE ANTACID 500 MG PO CHEW
400.0000 mg | CHEWABLE_TABLET | Freq: Three times a day (TID) | ORAL | Status: DC
  Administered 2017-01-15 – 2017-01-24 (×23): 400 mg via ORAL
  Filled 2017-01-15 (×23): qty 2

## 2017-01-15 MED ORDER — SODIUM CHLORIDE 0.9 % IV SOLN
INTRAVENOUS | Status: DC
  Administered 2017-01-15: 10:00:00 via INTRAVENOUS

## 2017-01-15 MED ORDER — PROPOFOL 10 MG/ML IV BOLUS
INTRAVENOUS | Status: DC | PRN
  Administered 2017-01-15: 200 mg via INTRAVENOUS

## 2017-01-15 MED ORDER — HYDROMORPHONE HCL 1 MG/ML IJ SOLN
INTRAMUSCULAR | Status: AC
  Filled 2017-01-15: qty 1

## 2017-01-15 MED ORDER — SODIUM CHLORIDE 0.9 % IV SOLN
INTRAVENOUS | Status: DC | PRN
  Administered 2017-01-15: 10:00:00 via INTRAVENOUS

## 2017-01-15 MED ORDER — OXYCODONE-ACETAMINOPHEN 5-325 MG PO TABS
1.0000 | ORAL_TABLET | ORAL | Status: DC | PRN
  Administered 2017-01-15 – 2017-01-16 (×3): 2 via ORAL
  Filled 2017-01-15 (×4): qty 2

## 2017-01-15 MED ORDER — PROMETHAZINE HCL 25 MG/ML IJ SOLN: 12.5000 mg | Freq: Three times a day (TID) | INTRAMUSCULAR | Status: DC | PRN

## 2017-01-15 MED ORDER — DARBEPOETIN ALFA 40 MCG/0.4ML IJ SOSY
40.0000 ug | PREFILLED_SYRINGE | INTRAMUSCULAR | Status: DC
  Administered 2017-01-16: 40 ug via INTRAVENOUS
  Filled 2017-01-15: qty 0.4

## 2017-01-15 MED ORDER — PHENYLEPHRINE HCL 10 MG/ML IJ SOLN
INTRAMUSCULAR | Status: DC | PRN
  Administered 2017-01-15: 80 ug via INTRAVENOUS

## 2017-01-15 MED ORDER — ONDANSETRON HCL 4 MG/2ML IJ SOLN
4.0000 mg | Freq: Once | INTRAMUSCULAR | Status: AC | PRN
  Administered 2017-01-15: 4 mg via INTRAVENOUS

## 2017-01-15 MED ORDER — HEPARIN (PORCINE) IN NACL 100-0.45 UNIT/ML-% IJ SOLN
1050.0000 [IU]/h | INTRAMUSCULAR | Status: DC
  Administered 2017-01-16: 950 [IU]/h via INTRAVENOUS
  Administered 2017-01-18 – 2017-01-23 (×3): 1050 [IU]/h via INTRAVENOUS
  Filled 2017-01-15 (×8): qty 250

## 2017-01-15 MED ORDER — PROMETHAZINE HCL 25 MG/ML IJ SOLN
12.5000 mg | Freq: Three times a day (TID) | INTRAMUSCULAR | Status: DC | PRN
  Administered 2017-01-15 – 2017-01-23 (×13): 12.5 mg via INTRAVENOUS
  Filled 2017-01-15 (×12): qty 1

## 2017-01-15 MED ORDER — PROTAMINE SULFATE 10 MG/ML IV SOLN
INTRAVENOUS | Status: DC | PRN
  Administered 2017-01-15 (×2): 20 mg via INTRAVENOUS
  Administered 2017-01-15: 10 mg via INTRAVENOUS

## 2017-01-15 MED ORDER — ONDANSETRON HCL 4 MG/2ML IJ SOLN
INTRAMUSCULAR | Status: AC
  Filled 2017-01-15: qty 2

## 2017-01-15 MED ORDER — LABETALOL HCL 5 MG/ML IV SOLN
INTRAVENOUS | Status: AC
  Filled 2017-01-15: qty 4

## 2017-01-15 MED ORDER — HEPARIN SODIUM (PORCINE) 1000 UNIT/ML IJ SOLN
INTRAMUSCULAR | Status: DC | PRN
  Administered 2017-01-15: 5000 [IU] via INTRAVENOUS

## 2017-01-15 MED ORDER — MIDAZOLAM HCL 2 MG/2ML IJ SOLN
INTRAMUSCULAR | Status: AC
  Filled 2017-01-15: qty 2

## 2017-01-15 MED ORDER — NEPRO/CARBSTEADY PO LIQD
237.0000 mL | Freq: Three times a day (TID) | ORAL | Status: DC
  Administered 2017-01-16 – 2017-01-22 (×5): 237 mL via ORAL
  Filled 2017-01-15 (×32): qty 237

## 2017-01-15 MED ORDER — LABETALOL HCL 5 MG/ML IV SOLN
5.0000 mg | Freq: Once | INTRAVENOUS | Status: AC
  Administered 2017-01-15: 5 mg via INTRAVENOUS

## 2017-01-15 MED ORDER — PHENYLEPHRINE HCL 10 MG/ML IJ SOLN
INTRAVENOUS | Status: DC | PRN
  Administered 2017-01-15: 25 ug/min via INTRAVENOUS

## 2017-01-15 MED ORDER — DEXAMETHASONE SODIUM PHOSPHATE 10 MG/ML IJ SOLN
INTRAMUSCULAR | Status: DC | PRN
  Administered 2017-01-15: 10 mg via INTRAVENOUS

## 2017-01-15 MED ORDER — MEPERIDINE HCL 25 MG/ML IJ SOLN: 6.2500 mg | INTRAMUSCULAR | Status: DC | PRN

## 2017-01-15 MED ORDER — HYDROMORPHONE HCL 1 MG/ML IJ SOLN
0.2500 mg | INTRAMUSCULAR | Status: DC | PRN
  Administered 2017-01-15: 0.5 mg via INTRAVENOUS

## 2017-01-15 MED ORDER — 0.9 % SODIUM CHLORIDE (POUR BTL) OPTIME
TOPICAL | Status: DC | PRN
Start: 2017-01-15 — End: 2017-01-15
  Administered 2017-01-15: 1000 mL

## 2017-01-15 MED ORDER — FENTANYL CITRATE (PF) 250 MCG/5ML IJ SOLN
INTRAMUSCULAR | Status: AC
  Filled 2017-01-15: qty 5

## 2017-01-15 MED ORDER — PROMETHAZINE HCL 25 MG/ML IJ SOLN
12.5000 mg | Freq: Once | INTRAMUSCULAR | Status: AC
  Administered 2017-01-15: 12.5 mg via INTRAVENOUS
  Filled 2017-01-15: qty 1

## 2017-01-15 MED ORDER — PROMETHAZINE HCL 25 MG PO TABS
12.5000 mg | ORAL_TABLET | Freq: Three times a day (TID) | ORAL | Status: DC | PRN
  Administered 2017-01-16 – 2017-01-24 (×3): 12.5 mg via ORAL
  Filled 2017-01-15 (×2): qty 1

## 2017-01-15 MED ORDER — INSULIN ASPART 100 UNIT/ML ~~LOC~~ SOLN
4.0000 [IU] | Freq: Once | SUBCUTANEOUS | Status: AC
Start: 2017-01-15 — End: 2017-01-15
  Administered 2017-01-15: 4 [IU] via SUBCUTANEOUS

## 2017-01-15 MED ORDER — MIDAZOLAM HCL 5 MG/5ML IJ SOLN
INTRAMUSCULAR | Status: DC | PRN
  Administered 2017-01-15: 2 mg via INTRAVENOUS

## 2017-01-15 MED ORDER — FENTANYL CITRATE (PF) 100 MCG/2ML IJ SOLN
INTRAMUSCULAR | Status: DC | PRN
  Administered 2017-01-15 (×2): 25 ug via INTRAVENOUS
  Administered 2017-01-15 (×3): 50 ug via INTRAVENOUS
  Administered 2017-01-15 (×2): 25 ug via INTRAVENOUS

## 2017-01-15 SURGICAL SUPPLY — 39 items
ADH SKN CLS APL DERMABOND .7 (GAUZE/BANDAGES/DRESSINGS) ×2
AGENT HMST SPONGE THK3/8 (HEMOSTASIS) ×1
ARMBAND PINK RESTRICT EXTREMIT (MISCELLANEOUS) ×2 IMPLANT
CANISTER SUCT 3000ML PPV (MISCELLANEOUS) ×2 IMPLANT
CANNULA VESSEL 3MM 2 BLNT TIP (CANNULA) ×2 IMPLANT
CLIP VESOCCLUDE MED 6/CT (CLIP) ×2 IMPLANT
CLIP VESOCCLUDE SM WIDE 6/CT (CLIP) ×2 IMPLANT
COVER PROBE W GEL 5X96 (DRAPES) IMPLANT
DECANTER SPIKE VIAL GLASS SM (MISCELLANEOUS) ×2 IMPLANT
DERMABOND ADVANCED (GAUZE/BANDAGES/DRESSINGS) ×2
DERMABOND ADVANCED .7 DNX12 (GAUZE/BANDAGES/DRESSINGS) ×1 IMPLANT
DRAIN PENROSE 1/4X12 LTX STRL (WOUND CARE) ×2 IMPLANT
ELECT REM PT RETURN 9FT ADLT (ELECTROSURGICAL) ×2
ELECTRODE REM PT RTRN 9FT ADLT (ELECTROSURGICAL) ×1 IMPLANT
GLOVE BIO SURGEON STRL SZ 6.5 (GLOVE) ×1 IMPLANT
GLOVE BIO SURGEON STRL SZ7.5 (GLOVE) ×2 IMPLANT
GLOVE BIOGEL PI IND STRL 6.5 (GLOVE) IMPLANT
GLOVE BIOGEL PI IND STRL 7.5 (GLOVE) IMPLANT
GLOVE BIOGEL PI INDICATOR 6.5 (GLOVE) ×1
GLOVE BIOGEL PI INDICATOR 7.5 (GLOVE) ×1
GLOVE ECLIPSE 7.0 STRL STRAW (GLOVE) ×1 IMPLANT
GOWN STRL REUS W/ TWL LRG LVL3 (GOWN DISPOSABLE) ×3 IMPLANT
GOWN STRL REUS W/TWL LRG LVL3 (GOWN DISPOSABLE) ×8
GRAFT GORETEX STRT 4-7X45 (Vascular Products) ×1 IMPLANT
HEMOSTAT SPONGE AVITENE ULTRA (HEMOSTASIS) ×1 IMPLANT
KIT BASIN OR (CUSTOM PROCEDURE TRAY) ×2 IMPLANT
KIT ROOM TURNOVER OR (KITS) ×2 IMPLANT
LOOP VESSEL MINI RED (MISCELLANEOUS) IMPLANT
NS IRRIG 1000ML POUR BTL (IV SOLUTION) ×2 IMPLANT
PACK CV ACCESS (CUSTOM PROCEDURE TRAY) ×2 IMPLANT
PAD ARMBOARD 7.5X6 YLW CONV (MISCELLANEOUS) ×4 IMPLANT
SLEEVE SURGEON STRL (DRAPES) ×1 IMPLANT
SUT PROLENE 6 0 CC (SUTURE) ×3 IMPLANT
SUT PROLENE 7 0 BV 1 (SUTURE) ×1 IMPLANT
SUT VIC AB 3-0 SH 27 (SUTURE) ×6
SUT VIC AB 3-0 SH 27X BRD (SUTURE) ×1 IMPLANT
SUT VICRYL 4-0 PS2 18IN ABS (SUTURE) ×4 IMPLANT
UNDERPAD 30X30 (UNDERPADS AND DIAPERS) ×2 IMPLANT
WATER STERILE IRR 1000ML POUR (IV SOLUTION) ×2 IMPLANT

## 2017-01-15 NOTE — Progress Notes (Addendum)
ANTICOAGULATION CONSULT NOTE - Follow Up Consult  Pharmacy Consult for Heparin  Indication: right atrial mass with possible clot  Allergies  Allergen Reactions  . No Known Allergies     Patient Measurements: Height: 5\' 8"  (172.7 cm) Weight: 200 lb 9.6 oz (91 kg) IBW/kg (Calculated) : 63.9  Vital Signs: Temp: 98.5 F (36.9 C) (10/01 1457) BP: 212/94 (10/01 1500) Pulse Rate: 85 (10/01 1500)  Labs:  Recent Labs  01/13/17 0620 01/14/17 0237 01/15/17 0420 01/15/17 0952  HGB 9.6* 9.4* 8.6* 9.5*  HCT 28.4* 28.9* 25.4* 28.0*  PLT 211 244 237  --   HEPARINUNFRC 0.50 0.33 0.72*  --     Estimated Creatinine Clearance: 11.3 mL/min (A) (by C-G formula based on SCr of 8.27 mg/dL (H)).  Assessment: 36 YOF with ESRD on HD who was found to have right atrial mass with possible clot on TEE. CT on 9/30 showed no evidence of PE.   Heparin was stopped this morning at 0552 prior to procedure for L forearm AV graft. Level had been 0.72 at 0420. CBC has been stable. No signs/symptoms of bleeding in the chart. Due to procedure today, warfarin has not yet been started. Pharmacy consulted to restart heparin infusion on 10/2 at 12 PM.  Goal of Therapy:  Heparin level 0.3-0.7 units/ml Monitor platelets by anticoagulation protocol: Yes   Plan:  -Restart heparin infusion on 10/2 at 1200 at rate of 950 units/hr -Daily heparin level and CBC while on heparin infusion -Follow-up plans to initiate warfarin post-procedure  Doylene Canard, PharmD Clinical Pharmacist  Phone: [phone] 01/15/2017 3:39 PM

## 2017-01-15 NOTE — Plan of Care (Signed)
Problem: Physical Regulation: Goal: Ability to maintain clinical measurements within normal limits will improve Outcome: Progressing Patient reports that nausea has been manageable with medication. Pt aware of need and purpose of heparin for possible thrombosis on HD cath.

## 2017-01-15 NOTE — Anesthesia Preprocedure Evaluation (Signed)
Anesthesia Evaluation  Patient identified by MRN, date of birth, ID band Patient awake    Reviewed: Allergy & Precautions, H&P , NPO status , Patient's Chart, lab work & pertinent test results  Airway Mallampati: II  TM Distance: >3 FB Neck ROM: Full    Dental no notable dental hx. (+) Teeth Intact, Dental Advisory Given   Pulmonary neg pulmonary ROS,    Pulmonary exam normal breath sounds clear to auscultation       Cardiovascular Exercise Tolerance: Good hypertension, Pt. on medications  Rhythm:Regular Rate:Normal     Neuro/Psych  Headaches, negative psych ROS   GI/Hepatic negative GI ROS, Neg liver ROS,   Endo/Other  diabetes, Insulin Dependent  Renal/GU ESRF and DialysisRenal disease  negative genitourinary   Musculoskeletal   Abdominal   Peds  Hematology negative hematology ROS (+)   Anesthesia Other Findings   Reproductive/Obstetrics negative OB ROS                             Anesthesia Physical  Anesthesia Plan  ASA: III  Anesthesia Plan: MAC   Post-op Pain Management:    Induction: Intravenous  PONV Risk Score and Plan: 1 and Ondansetron and Dexamethasone  Airway Management Planned: Simple Face Mask  Additional Equipment:   Intra-op Plan:   Post-operative Plan:   Informed Consent: I have reviewed the patients History and Physical, chart, labs and discussed the procedure including the risks, benefits and alternatives for the proposed anesthesia with the patient or authorized representative who has indicated his/her understanding and acceptance.   Dental advisory given  Plan Discussed with: CRNA  Anesthesia Plan Comments:         Anesthesia Quick Evaluation

## 2017-01-15 NOTE — Plan of Care (Signed)
Problem: Education: Goal: Knowledge of [facility] Education information/materials will improve Outcome: Progressing POC reviewed with pt.; pt. aware of surgery today for AV graft.

## 2017-01-15 NOTE — Transfer of Care (Signed)
Immediate Anesthesia Transfer of Care Note  Patient: Carrie [name]  Procedure(s) Performed: INSERTION OF  AV GORE-TEX GRAFT (Left Arm Lower)  Patient Location: PACU  Anesthesia Type:General  Level of Consciousness: awake, alert  and oriented  Airway & Oxygen Therapy: Patient Spontanous Breathing and Patient connected to face mask oxygen  Post-op Assessment: Report given to RN, Post -op Vital signs reviewed and stable and Patient moving all extremities X 4  Post vital signs: Reviewed and stable  Last Vitals:  Vitals:   01/14/17 2227 01/15/17 0338  BP: (!) 194/106 (!) 157/83  Pulse: 90 85  Resp: 16 16  Temp: 36.7 C 36.7 C  SpO2: 100% 100%    Last Pain:  Vitals:   01/15/17 0338  TempSrc: Oral  PainSc:       Patients Stated Pain Goal: 1 (26/37/85 8850)  Complications: No apparent anesthesia complications

## 2017-01-15 NOTE — Progress Notes (Signed)
[facility] Progress Note   Dialysis Orders: MWF -[facility]  4hrs BFR 400, DFR 800, EDW 84kg, 2K/2Ca TDC, L AVF maturing  Heparin 2500U IV bolus Calcitriol 0.54mcg PO qHD  Last labs: 9/19: hgb 11.1, 9/12: Ca 9.8, P 5.7, 8/22: TSAT 31%, Alb 2.8, PTH 282  Assessment/Plan: 1. R atrial echodensity mass -2nd stage BVT vs AVG this am. Per VVS. 1st stage BVT 09/06/16.  -TEE on 9/28 mass consistent with thrombus likely related to [facility]. Heparin gtt started per pharmacy          -CT angio showed no evidence PE. Anticoagulation for 40mon w/F/u TEE in 2wks, vs possible extraction. Cardiology/CT surg following.              -Will eventually need to have [address] removed whenever possible.  2. ESRD- MWF - HD planned this afternoon post surgery. Orders written.  3. Hypertension/volume -BP elevated, continue labetalol, hold pre HD. -Above EDW, continue to titrate down volume to reduce BP. 4. Anemia- Hgb trending down, 10.6>9.7>8.6, follow.   -Start 73mcg Aranesp IV qwk with HD today 62/8.  5. Metabolic bone disease- -Ca and PTH with in goal.  Continue TUMS, and calcitriol 0.56mcg qHD.   -Hyperphosphatemia noted on 9/28, P 7.4, ^Tums. 6. Nutrition- Nepro TID, Renavite, and Renal diet.  7. Type 1 DM- on insulin, managed by PCP 8. Gastroparesis             -On Reglan, Phenergan and Zofran   [name], PA-C [facility] 01/15/2017,9:07 AM  LOS: 3 days   Pt seen, examined and agree w A/P as above.  [name] MD [facility] pager [phone]   01/15/2017, 4:18 PM     Subjective:   Sitting upright in bed, waiting to go for surgery. Admits to continued nausea, causing decreased appetite. Denies V/D/C, chest pain, SOB, weakness and edema.   Objective Vitals:   01/14/17 0527 01/14/17 0833 01/14/17 2227 01/15/17 0338  BP: (!) 143/89 (!) 159/89 (!) 194/106 (!) 157/83   Pulse: 87 84 90 85  Resp: 16 16 16 16   Temp: 98.1 F (36.7 C) 98.6 F (37 C) 98 F (36.7 C) 98 F (36.7 C)  TempSrc: Oral Oral Oral Oral  SpO2: 100% 100% 100% 100%  Weight:   91 kg (200 lb 9.6 oz)   Height:   5\' 8"  (1.727 m)    Physical Exam General:NAD, WDWN Heart:RRR Lungs:CTA b/l Abdomen:soft, NT Extremities:no edema Dialysis Access: TDC, LU AVF +bruit   Filed Weights   01/12/17 0727 01/12/17 1130 01/14/17 2227  Weight: 88.1 kg (194 lb 3.6 oz) 86.1 kg (189 lb 13.1 oz) 91 kg (200 lb 9.6 oz)    Intake/Output Summary (Last 24 hours) at 01/15/17 0907 Last data filed at 01/15/17 0341  Gross per 24 hour  Intake            530.5 ml  Output                3 ml  Net            527.5 ml    Additional Objective Labs: Basic Metabolic Panel:  Recent Labs Lab 01/10/17 0233 01/11/17 1330 01/12/17 0459  NA 135 131* 131*  K 4.2 5.1 4.2  CL 96* 95* 91*  CO2 26 20* 25  GLUCOSE 96 208* 163*  BUN 60* 41* 52*  CREATININE 8.58* 7.14* 8.27*  CALCIUM 8.7* 8.9 9.1  PHOS  --  7.2* 7.4*  Liver Function Tests:  Recent Labs Lab 01/09/17 1409 01/10/17 0233 01/11/17 1330 01/12/17 0459  AST 14* 12*  --   --   ALT 13* 10*  --   --   ALKPHOS 77 68  --   --   BILITOT 0.8 0.6  --   --   PROT 7.1 6.4*  --   --   ALBUMIN 3.5 3.1* 2.9* 3.6   No results for input(s): LIPASE, AMYLASE in the last 168 hours. CBC:  Recent Labs Lab 01/09/17 1409  01/11/17 1834 01/12/17 0459 01/13/17 0620 01/14/17 0237 01/15/17 0420  WBC 7.1  < > 5.6 6.7 5.9 5.7 6.1  NEUTROABS 4.8  --   --   --   --   --   --   HGB 10.6*  < > 10.5* 10.5* 9.6* 9.4* 8.6*  HCT 32.4*  < > 32.0* 31.7* 28.4* 28.9* 25.4*  MCV 92.8  < > 91.4 90.8 90.7 90.9 90.1  PLT 261  < > 259 268 211 244 237  < > = values in this interval not displayed. Blood Culture    Component Value Date/Time   SDES NASAL SWAB 01/09/2017 2049   [place] NONE 01/09/2017 2049   CULT NO MRSA DETECTED 01/09/2017 2049   REPTSTATUS  01/11/2017 FINAL 01/09/2017 2049    CBG:  Recent Labs Lab 01/14/17 0812 01/14/17 1258 01/14/17 1702 01/14/17 2117 01/15/17 0811  GLUCAP 169* 158* 239* 273* 183*    Lab Results  Component Value Date   INR 1.03 01/12/2017   INR 1.03 01/09/2017   INR 0.98 09/07/2016   Studies/Results: Ct Angio Chest Pe W Or Wo Contrast  Result Date: 01/14/2017 CLINICAL DATA:  Possible pulmonary embolus. End-stage renal disease on dialysis. Right atrial mass on echocardiogram. EXAM: CT ANGIOGRAPHY CHEST WITH CONTRAST TECHNIQUE: Multidetector CT imaging of the chest was performed using the standard protocol during bolus administration of intravenous contrast. Multiplanar CT image reconstructions and MIPs were obtained to evaluate the vascular anatomy. CONTRAST:  80 mL Isovue 370 COMPARISON:  Chest radiograph 09/07/2016 FINDINGS: Cardiovascular: Pulmonary arterial opacification is adequate to the segmental level without evidence of emboli. A right jugular central venous catheter terminates near the tricuspid valve. There is suggestion of a filling defect in the posterior right atrium, however this is not not well evaluated due to motion artifact and contrast timing and the appearance may be secondary to inflow of non-opacified blood from the IVC. The heart is borderline enlarged. There is no pericardial effusion. The thoracic aorta is normal in caliber. Mediastinum/Nodes: No enlarged axillary, mediastinal, or hilar lymph nodes are identified. The thyroid and esophagus are grossly unremarkable. Lungs/Pleura: No pleural effusion or pneumothorax. Mild respiratory motion artifact without lung consolidation or mass. Upper Abdomen: Unremarkable. Musculoskeletal: No acute osseous abnormality or suspicious osseous lesion. Review of the MIP images confirms the above findings. IMPRESSION: 1. No evidence of pulmonary emboli. 2. Right atrial mass on echocardiogram not well evaluated on this nondedicated CTA. Electronically  Signed   By: Logan Bores M.D.   On: 01/14/2017 13:04    Medications: . sodium chloride    . sodium chloride    .  ceFAZolin (ANCEF) IV    . sodium chloride     . calcitRIOL  0.25 mcg Oral Q M,W,F-HD  . calcium carbonate  1 tablet Oral TID WC  . carbamazepine  200 mg Oral QHS  . gabapentin  300 mg Oral QHS  . insulin aspart  0-7 Units Subcutaneous TID  WC  . insulin aspart  3 Units Subcutaneous TID WC  . insulin glargine  10 Units Subcutaneous Daily  . labetalol  300 mg Oral BID  . metoCLOPramide  5 mg Oral TID AC  . ramelteon  8 mg Oral QHS  . sevelamer carbonate  800 mg Oral TID WC  . sodium chloride flush  3 mL Intravenous Q12H

## 2017-01-15 NOTE — Interval H&P Note (Signed)
History and Physical Interval Note:  01/15/2017 10:32 AM  Carrie [name]  has presented today for surgery, with the diagnosis of end stage renal disease  The various methods of treatment have been discussed with the patient and family. After consideration of risks, benefits and other options for treatment, the patient has consented to  Procedure(s): REVISION OF ARTERIOVENOUS FISTULA VERSUS GRAFT INSERTION (Left) as a surgical intervention .  The patient's history has been reviewed, patient examined, no change in status, stable for surgery.  I have reviewed the patient's chart and labs.  Questions were answered to the patient's satisfaction.     Ruta Hinds

## 2017-01-15 NOTE — Progress Notes (Signed)
Report given to Calli Bashor rn as caregiver 

## 2017-01-15 NOTE — H&P (View-Only) (Signed)
   VASCULAR SURGERY ASSESSMENT & PLAN:   Duplex of left 1st stage basilic vein transposition is pending.  The patient is scheduled for 2nd stage basilic vein transposition vs left AVG tomorrow.  Coumadin can be resumed after the procedure (for right atrial mass which is likely clot)  Heparin to be held at 6 AM tomorrow in anticipation of surgery.   Please do not do dialysis in AM as patient is scheduled for surgery in AM. She can dialyze after surgery or Tuesday.    SUBJECTIVE:   Resting comfortably. I did not wake her up.   PHYSICAL EXAM:   Vitals:   01/13/17 0912 01/13/17 1006 01/13/17 2014 01/14/17 0527  BP: (!) 157/56  (!) 144/76 (!) 143/89  Pulse: 84  90 87  Resp:  17 16 16   Temp:  98.2 F (36.8 C) 99 F (37.2 C) 98.1 F (36.7 C)  TempSrc:  Oral Oral Oral  SpO2:  100% 100% 100%  Weight:      Height:       Palpable thrill in left upper arm AVF  LABS:   Lab Results  Component Value Date   WBC 5.7 01/14/2017   HGB 9.4 (L) 01/14/2017   HCT 28.9 (L) 01/14/2017   MCV 90.9 01/14/2017   PLT 244 01/14/2017   Lab Results  Component Value Date   CREATININE 8.27 (H) 01/12/2017   Lab Results  Component Value Date   INR 1.03 01/12/2017   CBG (last 3)   Recent Labs  01/13/17 1751 01/13/17 2013 01/14/17 0014  GLUCAP 179* 266* 292*    PROBLEM LIST:    Principal Problem:   Right atrial mass   CURRENT MEDS:   . calcitRIOL  0.25 mcg Oral Q M,W,F-HD  . calcium carbonate  1 tablet Oral TID WC  . carbamazepine  200 mg Oral QHS  . gabapentin  300 mg Oral QHS  . insulin aspart  0-7 Units Subcutaneous TID WC  . insulin aspart  3 Units Subcutaneous TID WC  . insulin glargine  10 Units Subcutaneous Daily  . labetalol  300 mg Oral BID  . metoCLOPramide  5 mg Oral TID AC  . ramelteon  8 mg Oral QHS  . sevelamer carbonate  800 mg Oral TID WC  . sodium chloride flush  3 mL Intravenous Q12H    Gae Gallop Beeper: [phone] Office:  [phone] 01/14/2017

## 2017-01-15 NOTE — Progress Notes (Signed)
   Subjective: Patient seen and examined. Her only complaint today is nausea, which she attributes to her gastroparesis. She is requesting phenegran for her nausea, which works the best. She has no other complaints at this time. She denies CP, SOB, or palpitations.   Objective:  Vital signs in last 24 hours: Vitals:   01/14/17 0527 01/14/17 0833 01/14/17 2227 01/15/17 0338  BP: (!) 143/89 (!) 159/89 (!) 194/106 (!) 157/83  Pulse: 87 84 90 85  Resp: 16 16 16 16   Temp: 98.1 F (36.7 C) 98.6 F (37 C) 98 F (36.7 C) 98 F (36.7 C)  TempSrc: Oral Oral Oral Oral  SpO2: 100% 100% 100% 100%  Weight:   200 lb 9.6 oz (91 kg)   Height:   5\' 8"  (1.727 m)    General: Sitting up in bed comfortably, NAD HEENT: [place]/AT, EOMI, no scleral icterus, PERRL Cardiac: RRR, No R/M/G appreciated Pulm: normal effort, CTAB Abd: soft, non tender, non distended, BS normal Ext: extremities well perfused, no peripheral edema Neuro: alert and oriented X3, cranial nerves II-XII grossly intact   Assessment/Plan:  Principal Problem:   Right atrial mass  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 No known cardiac history. Patient has been hemodynamically stable during this admission. TEE on 9/27 showed large atrial mass that is thought to be a thrombus attached to the tip of HD catheter. CTA chest negative for PE, right atrial mass was not well evaluated because was non-dedicated CTA.  Started on heparin gtt with plan to anticoagulate with warfarin for 3 months prior to removing HD cath with follow up TEE in 2 weeks. -Continue heparin gtt with plan to start warfarin postoperatively.  -Will confirm duration of anticoagulation with CV surgery and appreciate recs -Will remain in the hospital while INR becomes therapeutic; education (per pharmacy) for coumadin and vitamin K diet.   ESRD 2/2 to poorly controlled T1DM and HTN, on HD MWF. HD cath placed on 08/2016.  - Surgery today for AVF revision vs  graft insertion , HD scheduled after sugery today - Removal of TDC in future - Nephrology following  T1DM Uncontrolled, complicated by peripheral neuropathy and gastroparesis. Most recent hemoglobin A1C 8.5 (11/2016).  - SQ Lantus 10U qAM + Novolog 3U TID with meals   - SSI-S  - Continue gabapentin 300 MG QHS - Continue antiemetics: reglan, zofran, and phenergan PRN for nausea  HTN -Typically normotensive after dialysis -Continue home labetalol 300 mg BID   Trigeminal neuralgia:  - Continue home carbamazepine 200 mg QHS   F: none E: will continue to monitor N: Renal + CMdiet --> NPO @ MN for surgery today  VTE ppx: Heparin gtt   Code status: Full code, confirmed on admission   Dispo: Anticipated discharge in approximately 3-4 day(s).   Melanee Spry, MD 01/15/2017, 9:30 AM Pager: [phone]

## 2017-01-15 NOTE — Op Note (Signed)
Procedure: Left Forearm AV graft  Preop: ESRD  Postop: ESRD  Anesthesia: General  Findings:4-7 mm PTFE end to end to basilic vein  Asst: Gerri Lins, PA-c   Procedure Details: The left upper extremity was prepped and draped in usual sterile fashion.  A transverse incision was then made near the antecubital crease the left arm.  A preexisting basilic vein fistula was dissected free circumferentially.  Preoperative duplex had suggested that fistula was 6 mm diameter but it was 4 mm at most.  Since this had not developed after 4 months, I did not feel doing a 2nd stage procedure was going to be a durable access.  I decided to place a forearm graft.   I carried the dissection down to the arterial anastomosis.  There was dense scar in this area so I decided to use a cuff of the old fistula as the proximal inflow for the graft just adjacent to the brachial artery.  The basilic vein was 3-4 mm diameter but to have enough length I made an additional longitudinal incision just above the antecubital crease and exposed the basilic vein in this location.  Next, a subcutaneous tunnel was created in a loop configuration in the forearm. A transverse incision was made in the distal forearm for assistance in tunneling.  A 4-7 mm PTFE graft was then brought through this subcutaneous tunnel. The patient was given 5000 units of intravenous heparin. After appropriate circulation time, a baby Gregory clamp was used to control the artery. The fistula was transected.  The 4 mm end of the graft was beveled and sewn end to side to the cuff of basilic vein on the artery using a 6 0 prolene.  At completion of the anastomosis the artery was forward bled, backbled and thoroughly flushed.  The anastomosis was secured, vessel loops were released and there was palpable pulse in the graft.  The graft was clamped just above the arterial anastomosis with a fistula clamp. The graft was then pulled taut to length.  The vein was  controlled with a fine bulldog clamp.  A longitudinal opening was made in the vein.   There was some thickened area of intimal hyperplasia and a valve which were debrided away.   The distal end of the graft was then beveled and sewn end to side to the vein using a running 6 0 prolene.  Just prior to completion of the anastomosis, everything was forward bled, back bled and thoroughly flushed.  The anastomosis was secured and the fistula clamp removed from the proximal graft.  A thrill was immediately palpable in the graft. The patient was given 50 mg of protamine to assist with hemostasis.  After hemostasis was obtained, the subcutaneous tissues were reapproximated using a running 3-0 Vicryl suture. The skin was then closed with a 4 0 Vicryl subcuticular stitch. Dermabond was applied to the skin incisions.  The patient tolerated the procedure well and there were no complications.  Instrument sponge and needle count was correct at the end of the case.  The patient was taken to the recovery room in stable condition. The patient had a palpable radial pulse at the end of the case.  Ruta Hinds, MD [facility] Office: [phone] Pager: [phone]

## 2017-01-15 NOTE — Anesthesia Procedure Notes (Signed)
Procedure Name: LMA Insertion Date/Time: 01/15/2017 10:39 AM Performed by: Neldon Newport Pre-anesthesia Checklist: Timeout performed, Patient being monitored, Suction available, Emergency Drugs available and Patient identified Patient Re-evaluated:Patient Re-evaluated prior to induction Oxygen Delivery Method: Circle system utilized Preoxygenation: Pre-oxygenation with 100% oxygen Induction Type: IV induction LMA: LMA inserted LMA Size: 4.0 Number of attempts: 1 Placement Confirmation: breath sounds checked- equal and bilateral and positive ETCO2 Tube secured with: Tape Dental Injury: Teeth and Oropharynx as per pre-operative assessment

## 2017-01-16 ENCOUNTER — Encounter (HOSPITAL_COMMUNITY): Payer: Self-pay | Admitting: Vascular Surgery

## 2017-01-16 DIAGNOSIS — I770 Arteriovenous fistula, acquired: Secondary | ICD-10-CM

## 2017-01-16 DIAGNOSIS — I513 Intracardiac thrombosis, not elsewhere classified: Principal | ICD-10-CM

## 2017-01-16 LAB — CBC
HCT: 25.9 % — ABNORMAL LOW (ref 36.0–46.0)
Hemoglobin: 8.5 g/dL — ABNORMAL LOW (ref 12.0–15.0)
MCH: 29.6 pg (ref 26.0–34.0)
MCHC: 32.8 g/dL (ref 30.0–36.0)
MCV: 90.2 fL (ref 78.0–100.0)
PLATELETS: 216 10*3/uL (ref 150–400)
RBC: 2.87 MIL/uL — ABNORMAL LOW (ref 3.87–5.11)
RDW: 16 % — AB (ref 11.5–15.5)
WBC: 5.1 10*3/uL (ref 4.0–10.5)

## 2017-01-16 LAB — PROTIME-INR
INR: 0.98
Prothrombin Time: 12.9 seconds (ref 11.4–15.2)

## 2017-01-16 LAB — GLUCOSE, CAPILLARY
Glucose-Capillary: 217 mg/dL — ABNORMAL HIGH (ref 65–99)
Glucose-Capillary: 159 mg/dL — ABNORMAL HIGH (ref 65–99)
Glucose-Capillary: 196 mg/dL — ABNORMAL HIGH (ref 65–99)
Glucose-Capillary: 247 mg/dL — ABNORMAL HIGH (ref 65–99)

## 2017-01-16 LAB — RENAL FUNCTION PANEL
Albumin: 3.1 g/dL — ABNORMAL LOW (ref 3.5–5.0)
Anion gap: 9 (ref 5–15)
BUN: 51 mg/dL — AB (ref 6–20)
CHLORIDE: 95 mmol/L — AB (ref 101–111)
CO2: 24 mmol/L (ref 22–32)
CREATININE: 8.08 mg/dL — AB (ref 0.44–1.00)
Calcium: 8.5 mg/dL — ABNORMAL LOW (ref 8.9–10.3)
GFR calc Af Amer: 7 mL/min — ABNORMAL LOW (ref >60)
GFR calc non Af Amer: 6 mL/min — ABNORMAL LOW (ref >60)
Glucose, Bld: 211 mg/dL — ABNORMAL HIGH (ref 65–99)
Phosphorus: 4.3 mg/dL (ref 2.5–4.6)
Potassium: 4.6 mmol/L (ref 3.5–5.1)
Sodium: 128 mmol/L — ABNORMAL LOW (ref 135–145)

## 2017-01-16 LAB — HEPARIN LEVEL (UNFRACTIONATED): Heparin Unfractionated: 0.17 IU/mL — ABNORMAL LOW (ref 0.30–0.70)

## 2017-01-16 MED ORDER — WARFARIN - PHARMACIST DOSING INPATIENT
Freq: Every day | Status: DC
  Administered 2017-01-17 – 2017-01-22 (×5)

## 2017-01-16 MED ORDER — OXYCODONE-ACETAMINOPHEN 5-325 MG PO TABS
1.0000 | ORAL_TABLET | ORAL | Status: DC | PRN
  Administered 2017-01-16 – 2017-01-18 (×9): 1 via ORAL
  Filled 2017-01-16 (×11): qty 1

## 2017-01-16 MED ORDER — OXYCODONE-ACETAMINOPHEN 5-325 MG PO TABS
1.0000 | ORAL_TABLET | ORAL | Status: DC | PRN
  Administered 2017-01-16: 2 via ORAL
  Filled 2017-01-16: qty 2

## 2017-01-16 MED ORDER — SODIUM CHLORIDE 0.9 % IV SOLN: 100.0000 mL | INTRAVENOUS | Status: DC | PRN

## 2017-01-16 MED ORDER — PROMETHAZINE HCL 25 MG/ML IJ SOLN
INTRAMUSCULAR | Status: AC
  Filled 2017-01-16: qty 1

## 2017-01-16 MED ORDER — WARFARIN SODIUM 7.5 MG PO TABS
7.5000 mg | ORAL_TABLET | Freq: Once | ORAL | Status: AC
  Administered 2017-01-16: 7.5 mg via ORAL
  Filled 2017-01-16: qty 1

## 2017-01-16 MED ORDER — DARBEPOETIN ALFA 40 MCG/0.4ML IJ SOSY
PREFILLED_SYRINGE | INTRAMUSCULAR | Status: AC
  Filled 2017-01-16: qty 0.4

## 2017-01-16 MED ORDER — CALCITRIOL 0.25 MCG PO CAPS
ORAL_CAPSULE | ORAL | Status: AC
  Filled 2017-01-16: qty 1

## 2017-01-16 NOTE — Progress Notes (Signed)
[facility]  Subjective  - left arm is sore   Objective 135/72 88 98.2 F (36.8 C) (Oral) 16 100%  Intake/Output Summary (Last 24 hours) at 01/16/17 0943 Last data filed at 01/16/17 7972  Gross per 24 hour  Intake          1033.84 ml  Output             2055 ml  Net         -1021.16 ml   + thrill in graft Some swelling no erythema No pulse at wrist Hand is warm incisions clean  Assessment/Planning: Elevate arm on several pillows to decrease swelling Graft should be ready to stick in 1 month Ok to restart heparin  Ruta Hinds 01/16/2017 9:43 AM --  Laboratory Lab Results:  Recent Labs  01/15/17 0420 01/15/17 0952 01/16/17 0536  WBC 6.1  --  5.1  HGB 8.6* 9.5* 8.5*  HCT 25.4* 28.0* 25.9*  PLT 237  --  216   BMET  Recent Labs  01/15/17 0952 01/15/17 2052  NA 129* 128*  K 4.8 4.6  CL  --  95*  CO2  --  24  GLUCOSE 187* 211*  BUN  --  51*  CREATININE  --  8.08*  CALCIUM  --  8.5*    COAG Lab Results  Component Value Date   INR 0.98 01/16/2017   INR 1.03 01/12/2017   INR 1.03 01/09/2017   No results found for: PTT

## 2017-01-16 NOTE — Progress Notes (Signed)
[facility] Progress Note   Dialysis Orders: MWF -[facility]  4hrs BFR 400, DFR 800, EDW 84kg, 2K/2Ca TDC, L AVF maturing  Heparin 2500 U IV bolus Calcitriol 0.57mcg PO qHD  Last labs: 9/19: hgb 11.1, 9/12: Ca 9.8, P 5.7, 8/22: TSAT 31%, Alb 2.8, PTH 282  Assessment/Plan: 1. R atrial echodensity mass             -TEE on 9/28 mass consistent with thrombus likely related to [facility]. Follow up TEE in 2weeks.  -Heparin gtt restarted post surgery. Started on warfarin. To stay as inpatient while INR becomes therapeutic - pharmacy following. 2. ESRD- MWF  -LU AVG placement by Dr. Oneida Alar on 10/1.  Should be ready to stick in 1 month.   -HD yesterday, tolerated well, net UF 2.03L, post weight 87.5kg.  Continue HD on regular schedule MWF, while admitted, orders written. 3. Hypertension/volume -BP better post HD. Cont labetalol.  -Not reaching EDW, does not appear volume overloaded, may need to increase at d/c. Cont to titrate down volume to determine new EDW. 4. Anemia- Hgb trending down, 10.6>9.7>8.6>8.5, follow. Hgb stable.             - Aranesp 40mcg IV qwk started on 09/6. 5. Metabolic bone disease- -Ca and PTH with in goal. Continue TUMS, and calcitriol 0.106mcg qHD.              -P at goal 4.3, cont Tums. 6. Nutrition- Nepro TID, Renavite, and Renal diet.  7. Type 1 DM- on insulin, managed by PCP 8. Gastroparesis -On Reglan, Phenergan and Zofran   [name], PA-C [facility] 01/16/2017,10:42 AM  LOS: 4 days   Pt seen, examined and agree w A/P as above. [name] MD [employer] pager [phone]   01/16/2017, 1:48 PM    Subjective:   Patient is laying in bed, some nausea, but no more than usual.  Complains of pain in arm from surgery. Denies CP, SOB, abdominal pain, v/d, edema, and numbness/tingling of fingers.   Objective Vitals:   01/16/17 0300  01/16/17 0310 01/16/17 0503 01/16/17 0900  BP: 134/60 140/73 (!) 148/83 135/72  Pulse: 80 81 87 88  Resp: 16 17 18 16   Temp:  97.8 F (36.6 C) 98.1 F (36.7 C) 98.2 F (36.8 C)  TempSrc:  Oral Oral Oral  SpO2:  95% 100% 100%  Weight:  87.5 kg (192 lb 14.4 oz)    Height:       Physical Exam General:NAD, WDWN Heart:RRR Lungs:CTA b/l, normal effort Abdomen:soft, NT, +BS Extremities:no LE edema. LUE:+swelling around LU AVG, no erythema, incision c/d/i, no drainage, 2+brachial pulses, digits warm  Dialysis Access: TDC, LU AVG +bruit  Filed Weights   01/15/17 2140 01/15/17 2328 01/16/17 0310  Weight: 91 kg (200 lb 11 oz) 89.5 kg (197 lb 5 oz) 87.5 kg (192 lb 14.4 oz)    Intake/Output Summary (Last 24 hours) at 01/16/17 1042 Last data filed at 01/16/17 0905  Gross per 24 hour  Intake          1033.84 ml  Output             2055 ml  Net         -1021.16 ml    Additional Objective Labs: Basic Metabolic Panel:  Recent Labs Lab 01/11/17 1330 01/12/17 0459 01/15/17 0952 01/15/17 2052  NA 131* 131* 129* 128*  K 5.1 4.2 4.8 4.6  CL 95* 91*  --  95*  CO2  20* 25  --  24  GLUCOSE 208* 163* 187* 211*  BUN 41* 52*  --  51*  CREATININE 7.14* 8.27*  --  8.08*  CALCIUM 8.9 9.1  --  8.5*  PHOS 7.2* 7.4*  --  4.3   Liver Function Tests:  Recent Labs Lab 01/09/17 1409 01/10/17 0233 01/11/17 1330 01/12/17 0459 01/15/17 2052  AST 14* 12*  --   --   --   ALT 13* 10*  --   --   --   ALKPHOS 77 68  --   --   --   BILITOT 0.8 0.6  --   --   --   PROT 7.1 6.4*  --   --   --   ALBUMIN 3.5 3.1* 2.9* 3.6 3.1*   No results for input(s): LIPASE, AMYLASE in the last 168 hours. CBC:  Recent Labs Lab 01/09/17 1409  01/12/17 0459 01/13/17 0620 01/14/17 0237 01/15/17 0420 01/15/17 0952 01/16/17 0536  WBC 7.1  < > 6.7 5.9 5.7 6.1  --  5.1  NEUTROABS 4.8  --   --   --   --   --   --   --   HGB 10.6*  < > 10.5* 9.6* 9.4* 8.6* 9.5* 8.5*  HCT 32.4*  < > 31.7* 28.4* 28.9* 25.4*  28.0* 25.9*  MCV 92.8  < > 90.8 90.7 90.9 90.1  --  90.2  PLT 261  < > 268 211 244 237  --  216  < > = values in this interval not displayed. Blood Culture    Component Value Date/Time   SDES NASAL SWAB 01/09/2017 2049   [place] NONE 01/09/2017 2049   CULT NO MRSA DETECTED 01/09/2017 2049   REPTSTATUS 01/11/2017 FINAL 01/09/2017 2049    CBG:  Recent Labs Lab 01/15/17 0811 01/15/17 1313 01/15/17 1652 01/15/17 2138 01/16/17 0752  GLUCAP 183* 172* 217* 324* 159*   Iron Studies: No results for input(s): IRON, TIBC, TRANSFERRIN, FERRITIN in the last 72 hours. Lab Results  Component Value Date   INR 0.98 01/16/2017   INR 1.03 01/12/2017   INR 1.03 01/09/2017   Studies/Results: Ct Angio Chest Pe W Or Wo Contrast  Result Date: 01/14/2017 CLINICAL DATA:  Possible pulmonary embolus. End-stage renal disease on dialysis. Right atrial mass on echocardiogram. EXAM: CT ANGIOGRAPHY CHEST WITH CONTRAST TECHNIQUE: Multidetector CT imaging of the chest was performed using the standard protocol during bolus administration of intravenous contrast. Multiplanar CT image reconstructions and MIPs were obtained to evaluate the vascular anatomy. CONTRAST:  80 mL Isovue 370 COMPARISON:  Chest radiograph 09/07/2016 FINDINGS: Cardiovascular: Pulmonary arterial opacification is adequate to the segmental level without evidence of emboli. A right jugular central venous catheter terminates near the tricuspid valve. There is suggestion of a filling defect in the posterior right atrium, however this is not not well evaluated due to motion artifact and contrast timing and the appearance may be secondary to inflow of non-opacified blood from the IVC. The heart is borderline enlarged. There is no pericardial effusion. The thoracic aorta is normal in caliber. Mediastinum/Nodes: No enlarged axillary, mediastinal, or hilar lymph nodes are identified. The thyroid and esophagus are grossly unremarkable. Lungs/Pleura: No  pleural effusion or pneumothorax. Mild respiratory motion artifact without lung consolidation or mass. Upper Abdomen: Unremarkable. Musculoskeletal: No acute osseous abnormality or suspicious osseous lesion. Review of the MIP images confirms the above findings. IMPRESSION: 1. No evidence of pulmonary emboli. 2. Right atrial mass on echocardiogram not  well evaluated on this nondedicated CTA. Electronically Signed   By: Logan Bores M.D.   On: 01/14/2017 13:04    Medications: . sodium chloride    . sodium chloride    . sodium chloride 10 mL/hr at 01/15/17 0938  . heparin    . sodium chloride     . calcitRIOL      . calcitRIOL  0.25 mcg Oral Q M,W,F-HD  . calcium carbonate  400 mg of elemental calcium Oral TID WC  . carbamazepine  200 mg Oral QHS  . darbepoetin (ARANESP) injection - DIALYSIS  40 mcg Intravenous Q Mon-HD  . feeding supplement (NEPRO CARB STEADY)  237 mL Oral TID AC  . gabapentin  300 mg Oral QHS  . insulin aspart  0-7 Units Subcutaneous TID WC  . insulin aspart  3 Units Subcutaneous TID WC  . insulin glargine  10 Units Subcutaneous Daily  . labetalol  300 mg Oral BID  . metoCLOPramide  5 mg Oral TID AC  . ramelteon  8 mg Oral QHS  . sevelamer carbonate  800 mg Oral TID WC  . sodium chloride flush  3 mL Intravenous Q12H

## 2017-01-16 NOTE — Anesthesia Postprocedure Evaluation (Signed)
Anesthesia Post Note  Patient: VAUDINE DUTAN  Procedure(s) Performed: INSERTION OF  AV GORE-TEX GRAFT (Left Arm Lower)     Patient location during evaluation: PACU Anesthesia Type: General Level of consciousness: awake and alert Pain management: pain level controlled Vital Signs Assessment: post-procedure vital signs reviewed and stable Respiratory status: spontaneous breathing, nonlabored ventilation, respiratory function stable and patient connected to nasal cannula oxygen Cardiovascular status: blood pressure returned to baseline and stable Postop Assessment: no apparent nausea or vomiting Anesthetic complications: no    Last Vitals:  Vitals:   01/16/17 0310 01/16/17 0503  BP: 140/73 (!) 148/83  Pulse: 81 87  Resp: 17 18  Temp: 36.6 C 36.7 C  SpO2: 95% 100%    Last Pain:  Vitals:   01/16/17 0510  TempSrc:   PainSc: 9                  Maan Zarcone EDWARD

## 2017-01-16 NOTE — Plan of Care (Signed)
Problem: Pain Managment: Goal: General experience of comfort will improve Outcome: Progressing Patient reports some pain relief with pain medication and nausea medication.

## 2017-01-16 NOTE — Progress Notes (Signed)
   Subjective: Patient seen and examined. Patient tolerated the AV graft insertion well and is having appropriate post operative pain. She had nausea after the procedure, which has since resolved. She has no other complaints at this time. She denies CP, SOB, or palpitations.   Objective:  Vital signs in last 24 hours: Vitals:   01/16/17 0300 01/16/17 0310 01/16/17 0503 01/16/17 0900  BP: 134/60 140/73 (!) 148/83 135/72  Pulse: 80 81 87 88  Resp: 16 17 18 16   Temp:  97.8 F (36.6 C) 98.1 F (36.7 C) 98.2 F (36.8 C)  TempSrc:  Oral Oral Oral  SpO2:  95% 100% 100%  Weight:  192 lb 14.4 oz (87.5 kg)    Height:       General: Laying in bed with left arm elevated, NAD HEENT: [place]/AT, EOMI, no scleral icterus Cardiac: RRR, No R/M/G appreciated Pulm: normal effort, CTAB Abd: soft, non tender, non distended, BS normal Ext: extremities well perfused, no peripheral edema; left AV graft insertion site clean and dry with mild swelling, no erythema  Neuro: alert and oriented X3, cranial nerves II-XII grossly intact   Assessment/Plan:  Principal Problem:   Right atrial mass  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 No known cardiac history. Patient has been hemodynamically stable during this admission. TEE on 9/27 showed large atrial mass that is thought to be a thrombus attached to the tip of HD catheter. CTA chest negative for PE, right atrial mass was not well evaluated because was non-dedicated CTA.  Started on heparin gtt with plan to anticoagulate with warfarin for 3 months prior to removing HD cath with follow up TEE in 2 weeks. -Continue heparin gtt -Start warfarin  -Will remain in the hospital while INR becomes therapeutic; education (per pharmacy) for coumadin and vitamin K diet.  -Will discuss with CT surgery for other recommendations now that CTA is negative for PE  ESRD 2/2 to poorly controlled T1DM and HTN, on HD MWF. HD cath placed on 08/2016.  - S/P POD #1 AV  graft insertion (Percocet q3 PRN for pain management), HD session completed after surgery - Removal of TDC in future following anticoagulation - Nephrology following  T1DM Uncontrolled, complicated by peripheral neuropathy and gastroparesis. Most recent hemoglobin A1C 8.5 (11/2016).  - SQ Lantus 10U qAM + Novolog 3U TID with meals   - SSI-S  - Continue gabapentin 300 MG QHS - Continue antiemetics: reglan, zofran, and phenergan PRN for nausea  HTN -Typically normotensive after dialysis -Continue home labetalol 300 mg BID   Trigeminal neuralgia:  - Continue home carbamazepine 200 mg QHS   F: none E: will continue to monitor N: Renal + CMdiet --> NPO @ MN for surgery today  VTE ppx: Heparin gtt   Code status: Full code, confirmed on admission   Dispo: Anticipated discharge in approximately 3-4 day(s).   Melanee Spry, MD 01/16/2017, 10:49 AM Pager: [phone]

## 2017-01-16 NOTE — Progress Notes (Addendum)
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  Allergies  Allergen Reactions  . No Known Allergies     Patient Measurements: Height: 5\' 8"  (172.7 cm) Weight: 195 lb (88.5 kg) IBW/kg (Calculated) : 63.9  Vital Signs: Temp: 98.8 F (37.1 C) (10/02 2013) Temp Source: Oral (10/02 1735) BP: 175/105 (10/02 2013) Pulse Rate: 91 (10/02 2013)  Labs:  Recent Labs  01/14/17 0237 01/15/17 0420 01/15/17 0952 01/15/17 2052 01/16/17 0536 01/16/17 2053  HGB 9.4* 8.6* 9.5*  --  8.5*  --   HCT 28.9* 25.4* 28.0*  --  25.9*  --   PLT 244 237  --   --  216  --   LABPROT  --   --   --   --  12.9  --   INR  --   --   --   --  0.98  --   HEPARINUNFRC 0.33 0.72*  --   --   --  0.17*  CREATININE  --   --   --  8.08*  --   --     Estimated Creatinine Clearance: 11.4 mL/min (A) (by C-G formula based on SCr of 8.08 mg/dL (H)).  Assessment: 70 YOF with ESRD on HD who was found to have right atrial mass . TEE on 9/27 showed large right atrial mass thought to be a thrombus attached to tip of HD catheter. CT on 9/30 showed no evidence of PE. Today POD #1 s/p  L forearm AV graft.  Heparin level tonight came back below therapeutic range at 0.17. CBC has been stable. No interruptions noted by nursing. MD notes patient will remain in the hospital while INR becomes therapeutic. Small oozing of bleed near AV graft so will be conservative with heparin dosing and monitor closely for any worsening.   Goal of Therapy:  Heparin level 0.3-0.7 units/ml Monitor platelets by anticoagulation protocol: Yes   Plan:  -Increase heparin rate to 1050 units/hr -Daily heparin level , PT/INR, CBC while on heparin infusion  Doylene Canard, PharmD Clinical Pharmacist  Phone: [phone] 01/16/2017 9:40 PM

## 2017-01-16 NOTE — Progress Notes (Signed)
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  Allergies  Allergen Reactions  . No Known Allergies     Patient Measurements: Height: 5\' 8"  (172.7 cm) Weight: 192 lb 14.4 oz (87.5 kg) IBW/kg (Calculated) : 63.9  Vital Signs: Temp: 98.2 F (36.8 C) (10/02 0900) Temp Source: Oral (10/02 0900) BP: 135/72 (10/02 0900) Pulse Rate: 88 (10/02 0900)  Labs:  Recent Labs  01/14/17 0237 01/15/17 0420 01/15/17 0952 01/15/17 2052 01/16/17 0536  HGB 9.4* 8.6* 9.5*  --  8.5*  HCT 28.9* 25.4* 28.0*  --  25.9*  PLT 244 237  --   --  216  LABPROT  --   --   --   --  12.9  INR  --   --   --   --  0.98  HEPARINUNFRC 0.33 0.72*  --   --   --   CREATININE  --   --   --  8.08*  --     Estimated Creatinine Clearance: 11.4 mL/min (A) (by C-G formula based on SCr of 8.08 mg/dL (H)).  Assessment: 61 YOF with ESRD on HD who was found to have right atrial mass . TEE on 9/27 showed large right atrial mass thought to be a thrombus attached to tip of HD catheter. CT on 9/30 showed no evidence of PE.  Today POD #1 s/p  L forearm AV graft CBC has been stable with low stable H/H and pltc wnl.  No signs/symptoms of bleeding in the chart. Pharmacy consulted to restart heparin infusion today 10/2 at 12 PM (24 hours post L AV graft placement) and also consulted to start Coumadin today 10/2.  MD notes patient will remain in the hospital while INR becomes therapeutic.   Goal of Therapy:  Heparin level 0.3-0.7 units/ml Monitor platelets by anticoagulation protocol: Yes   Plan:  -Restart heparin infusion on 10/2 at 1200 at rate of 950 units/hr 8hour heparin level tonight Coumadin 7.5 mg po today x1 -Daily heparin level , PT/INR CBC while on heparin infusion   Nicole Cella, RPh Clinical Pharmacist Pager: [phone] Phone: [phone] 8a-330p 3:30p-1030p [id number] or [id number] or call main  pharmacy [phone] 01/16/2017 11:19 AM

## 2017-01-16 NOTE — Progress Notes (Addendum)
Inpatient Diabetes Program Recommendations  AACE/ADA: New Consensus Statement on Inpatient Glycemic Control (2015)  Target Ranges:  Prepandial:   less than 140 mg/dL      Peak postprandial:   less than 180 mg/dL (1-2 hours)      Critically ill patients:  140 - 180 mg/dL   Lab Results  Component Value Date   GLUCAP 196 (H) 01/16/2017   HGBA1C 8.5 12/12/2016    Review of Glycemic Control  Results for ARYN, SAFRAN (MRN [id number]) as of 01/16/2017 13:28  Ref. Range 01/15/2017 13:13 01/15/2017 16:52 01/15/2017 21:38 01/16/2017 07:52 01/16/2017 11:39  Glucose-Capillary Latest Ref Range: 65 - 99 mg/dL 172 (H) 217 (H) 324 (H) 159 (H) 196 (H)    Diabetes history: Type 2 Outpatient Diabetes medications: Novolog 5 units tid, Lantus 10 units qhs  Current orders for Inpatient glycemic control: Lantus 10 units qhs, Novolog 3 units tid, Novolog 0-7 units tid  Inpatient Diabetes Program Recommendations: Consider increasing Lantus to 12 units qhs (fasting blood sugars elevated)and Novolog correction to sensitive correction scale 0-9 units tid  *please ensure insulin is given within 1 hour of the blood sugar being taken and the insulin is given 4 hours apart  Gentry Fitz, RN, [place], [place], CDE Diabetes Coordinator Inpatient Diabetes Program  [phone] (Team Pager) [phone] ([place]) 01/16/2017 1:40 PM

## 2017-01-17 ENCOUNTER — Encounter (HOSPITAL_COMMUNITY): Payer: BLUE CROSS/BLUE SHIELD

## 2017-01-17 DIAGNOSIS — K5900 Constipation, unspecified: Secondary | ICD-10-CM

## 2017-01-17 LAB — CBC
HEMATOCRIT: 26.8 % — AB (ref 36.0–46.0)
Hemoglobin: 8.7 g/dL — ABNORMAL LOW (ref 12.0–15.0)
MCH: 29.5 pg (ref 26.0–34.0)
MCHC: 32.5 g/dL (ref 30.0–36.0)
MCV: 90.8 fL (ref 78.0–100.0)
PLATELETS: 218 10*3/uL (ref 150–400)
RBC: 2.95 MIL/uL — ABNORMAL LOW (ref 3.87–5.11)
RDW: 15.9 % — AB (ref 11.5–15.5)
WBC: 8.1 10*3/uL (ref 4.0–10.5)

## 2017-01-17 LAB — RENAL FUNCTION PANEL
Albumin: 2.9 g/dL — ABNORMAL LOW (ref 3.5–5.0)
Anion gap: 11 (ref 5–15)
BUN: 45 mg/dL — AB (ref 6–20)
CHLORIDE: 91 mmol/L — AB (ref 101–111)
CO2: 25 mmol/L (ref 22–32)
CREATININE: 8.72 mg/dL — AB (ref 0.44–1.00)
Calcium: 8.7 mg/dL — ABNORMAL LOW (ref 8.9–10.3)
GFR calc Af Amer: 6 mL/min — ABNORMAL LOW (ref >60)
GFR calc non Af Amer: 5 mL/min — ABNORMAL LOW (ref >60)
Glucose, Bld: 234 mg/dL — ABNORMAL HIGH (ref 65–99)
Phosphorus: 5.8 mg/dL — ABNORMAL HIGH (ref 2.5–4.6)
Potassium: 4.1 mmol/L (ref 3.5–5.1)
Sodium: 127 mmol/L — ABNORMAL LOW (ref 135–145)

## 2017-01-17 LAB — GLUCOSE, CAPILLARY
Glucose-Capillary: 222 mg/dL — ABNORMAL HIGH (ref 65–99)
Glucose-Capillary: 276 mg/dL — ABNORMAL HIGH (ref 65–99)
Glucose-Capillary: 145 mg/dL — ABNORMAL HIGH (ref 65–99)
Glucose-Capillary: 259 mg/dL — ABNORMAL HIGH (ref 65–99)
Glucose-Capillary: 199 mg/dL — ABNORMAL HIGH (ref 65–99)

## 2017-01-17 LAB — PROTIME-INR
INR: 1.05
Prothrombin Time: 13.6 seconds (ref 11.4–15.2)

## 2017-01-17 LAB — HEPARIN LEVEL (UNFRACTIONATED)
HEPARIN UNFRACTIONATED: 0.39 [IU]/mL (ref 0.30–0.70)
Heparin Unfractionated: 0.42 IU/mL (ref 0.30–0.70)

## 2017-01-17 MED ORDER — ONDANSETRON HCL 4 MG/2ML IJ SOLN
4.0000 mg | Freq: Four times a day (QID) | INTRAMUSCULAR | Status: DC | PRN
  Administered 2017-01-17 – 2017-01-24 (×9): 4 mg via INTRAVENOUS
  Filled 2017-01-17 (×10): qty 2

## 2017-01-17 MED ORDER — PENTAFLUOROPROP-TETRAFLUOROETH EX AERO: 1.0000 "application " | INHALATION_SPRAY | CUTANEOUS | Status: DC | PRN

## 2017-01-17 MED ORDER — PROMETHAZINE HCL 25 MG/ML IJ SOLN
12.5000 mg | Freq: Once | INTRAMUSCULAR | Status: DC
  Filled 2017-01-17: qty 1

## 2017-01-17 MED ORDER — LIDOCAINE-PRILOCAINE 2.5-2.5 % EX CREA: 1.0000 "application " | TOPICAL_CREAM | CUTANEOUS | Status: DC | PRN

## 2017-01-17 MED ORDER — LIDOCAINE HCL (PF) 1 % IJ SOLN: 5.0000 mL | INTRAMUSCULAR | Status: DC | PRN

## 2017-01-17 MED ORDER — RESOURCE THICKENUP CLEAR PO POWD
ORAL | Status: DC | PRN
  Filled 2017-01-17: qty 125

## 2017-01-17 MED ORDER — SENNOSIDES-DOCUSATE SODIUM 8.6-50 MG PO TABS
1.0000 | ORAL_TABLET | Freq: Every day | ORAL | Status: DC
  Administered 2017-01-17 – 2017-01-18 (×2): 1 via ORAL
  Filled 2017-01-17 (×3): qty 1

## 2017-01-17 MED ORDER — CALCITRIOL 0.25 MCG PO CAPS
ORAL_CAPSULE | ORAL | Status: AC
  Filled 2017-01-17: qty 1

## 2017-01-17 MED ORDER — WARFARIN VIDEO
Freq: Once | Status: AC
  Administered 2017-01-18: 21:00:00

## 2017-01-17 MED ORDER — HEPARIN SODIUM (PORCINE) 1000 UNIT/ML DIALYSIS: 1000.0000 [IU] | INTRAMUSCULAR | Status: DC | PRN

## 2017-01-17 MED ORDER — ALTEPLASE 2 MG IJ SOLR: 2.0000 mg | Freq: Once | INTRAMUSCULAR | Status: DC | PRN

## 2017-01-17 MED ORDER — SODIUM CHLORIDE 0.9 % IV SOLN: 100.0000 mL | INTRAVENOUS | Status: DC | PRN

## 2017-01-17 MED ORDER — WARFARIN SODIUM 7.5 MG PO TABS
7.5000 mg | ORAL_TABLET | Freq: Once | ORAL | Status: AC
  Administered 2017-01-17: 7.5 mg via ORAL
  Filled 2017-01-17: qty 1

## 2017-01-17 MED ORDER — ONDANSETRON HCL 4 MG/2ML IJ SOLN
INTRAMUSCULAR | Status: AC
  Administered 2017-01-17: 4 mg
  Filled 2017-01-17: qty 2

## 2017-01-17 MED ORDER — PROMETHAZINE HCL 25 MG/ML IJ SOLN
INTRAMUSCULAR | Status: AC
  Administered 2017-01-17: 12.5 mg
  Filled 2017-01-17: qty 1

## 2017-01-17 NOTE — Discharge Instructions (Signed)

## 2017-01-17 NOTE — Progress Notes (Addendum)
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  Allergies  Allergen Reactions  . No Known Allergies     Patient Measurements: Height: 5\' 8"  (172.7 cm) Weight: 195 lb (88.5 kg) (Notified Alvin on weight gain over the past 2weeks) IBW/kg (Calculated) : 63.9  Vital Signs: Temp: 98.7 F (37.1 C) (10/03 0552) BP: 152/84 (10/03 0552) Pulse Rate: 85 (10/03 0552)  Labs:  Recent Labs  01/15/17 0420 01/15/17 9675 01/15/17 2052 01/16/17 0536 01/16/17 2053 01/17/17 0451  HGB 8.6* 9.5*  --  8.5*  --  8.7*  HCT 25.4* 28.0*  --  25.9*  --  26.8*  PLT 237  --   --  216  --  218  LABPROT  --   --   --  12.9  --  13.6  INR  --   --   --  0.98  --  1.05  HEPARINUNFRC 0.72*  --   --   --  0.17* 0.42  CREATININE  --   --  8.08*  --   --   --     Estimated Creatinine Clearance: 11.4 mL/min (A) (by C-G formula based on SCr of 8.08 mg/dL (H)).  Assessment: 69 YOF with ESRD on HD who was found to have right atrial mass . TEE on 9/27 showed large right atrial mass thought to be a thrombus attached to tip of HD catheter. CT on 9/30 showed no evidence of PE.   POD #2 s/p  L forearm AV graft.  Heparin level = 0.42 therapeutic range on heparin drip 1050 units/hr.   Hgb low/stable and pltc wnl.  INR = 1.05. Started on Coumadin last night.  Small oozing of blood near AV graft reported last night. No new bleeding reported.    Goal of Therapy:  Heparin level 0.3-0.7 units/ml Monitor platelets by anticoagulation protocol: Yes   Plan:  Continue heparin rate at 1050 units/hr 6 hour heparin level to confirm remains therapeutic. Coumadin 7.5 mg po today x1 -Daily heparin level , PT/INR, CBC while on heparin infusion  Nicole Cella, RPh Clinical Pharmacist Pager: [phone] 8a-330p [phone] 330p-1030p phone [phone] or [phone] Main pharmacy [phone]  01/17/2017 8:39 AM

## 2017-01-17 NOTE — Progress Notes (Signed)
[facility] Progress Note   Dialysis Orders:  MWF -[facility]   4hrs BFR 400, DFR 800, EDW 84kg, 2K/2Ca  TDC, L AVF maturing   Heparin 2500 U IV bolus  Calcitriol 0.42mcg PO qHD   Last labs: 9/19: hgb 11.1, 9/12: Ca 9.8, P 5.7, 8/22: TSAT 31%, Alb 2.8, PTH 282  Assessment/Plan: 1. R atrial echodensity mass -TEE on 9/28 mass consistent with thrombus likely related to [facility]. For anticoagulation then repeat TEE later (3 months) to see if clot has resolved before removing the catheter.  2. ESRD- MWF             -LU AVG placement by Dr. Oneida Alar on 10/1.  Should be ready to stick in 1 month. VVS following.  HD today. Orders written.   Note that the [facility] should not be pulled when the AVG is successfully used in this case, given clot on cath tip and risk of embolization , see plan above.  3. Hypertension/volume -BP elevated. Cont labetalol.  -Not meeting EDW, titrate down volume as BP will tolerate. Will need new EDW at d/c. 4. Anemia- Hgb trending down since admission. Currently stable, 8.5>8.7. Monitor.  - Aranesp 57mcg IV qwk started on 10/1 (Mon). 5. Metabolic bone disease- -Ca, P and PTH with in goal. Continue TUMS, and calcitriol 0.34mcg qHD. Labs pre HD today.  6. Nutrition- Nepro TID, Renavite, and Renal diet.  7. Type 1 DM- on insulin, managed by PCP 8. Gastroparesis -On Reglan, Phenergan and Zofran   [name], PA-C [facility] 01/17/2017,10:00 AM  LOS: 5 days   Pt seen, examined, agree w assess/plan as above with additions as indicated.  [name] MD [facility] pager [phone]    cell [phone] 01/17/2017, 10:34 AM     Subjective:   Patient reports vomiting this am, starting to feel better now, trying to eat breakfast. Continues to have pain in left arm, states "it feels more swollen and tight today".  Denies loss of sensation or cold  digits, SOB, CP, chills and diarrhea.   Objective Vitals:   01/16/17 1735 01/16/17 2013 01/16/17 2108 01/17/17 0552  BP: (!) 144/83 (!) 175/105  (!) 152/84  Pulse: 89 91  85  Resp: 18 17  18   Temp: 99.4 F (37.4 C) 98.8 F (37.1 C)  98.7 F (37.1 C)  TempSrc: Oral     SpO2: 100% 100%  99%  Weight:   88.5 kg (195 lb)   Height:       Physical Exam General:NAD, WDWN, sitting on side of bed Heart: RRR Lungs:CTA b/l Abdomen:soft, NT Extremities:no edema in LE, LUE: +swelling, no erythema, incisions c/d/i, absent RP, hand/digits warm Dialysis Access: LU AVG +bruit   Filed Weights   01/15/17 2328 01/16/17 0310 01/16/17 2108  Weight: 89.5 kg (197 lb 5 oz) 87.5 kg (192 lb 14.4 oz) 88.5 kg (195 lb)    Intake/Output Summary (Last 24 hours) at 01/17/17 1000 Last data filed at 01/17/17 0600  Gross per 24 hour  Intake           413.78 ml  Output              100 ml  Net           313.78 ml    Additional Objective Labs: Basic Metabolic Panel:  Recent Labs Lab 01/11/17 1330 01/12/17 0459 01/15/17 0952 01/15/17 2052  NA 131* 131* 129* 128*  K 5.1 4.2 4.8 4.6  CL 95* 91*  --  95*  CO2 20* 25  --  24  GLUCOSE 208* 163* 187* 211*  BUN 41* 52*  --  51*  CREATININE 7.14* 8.27*  --  8.08*  CALCIUM 8.9 9.1  --  8.5*  PHOS 7.2* 7.4*  --  4.3   Liver Function Tests:  Recent Labs Lab 01/11/17 1330 01/12/17 0459 01/15/17 2052  ALBUMIN 2.9* 3.6 3.1*   CBC:  Recent Labs Lab 01/13/17 0620 01/14/17 0237 01/15/17 0420 01/15/17 0952 01/16/17 0536 01/17/17 0451  WBC 5.9 5.7 6.1  --  5.1 8.1  HGB 9.6* 9.4* 8.6* 9.5* 8.5* 8.7*  HCT 28.4* 28.9* 25.4* 28.0* 25.9* 26.8*  MCV 90.7 90.9 90.1  --  90.2 90.8  PLT 211 244 237  --  216 218   Blood Culture    Component Value Date/Time   SDES NASAL SWAB 01/09/2017 2049   [place] NONE 01/09/2017 2049   CULT NO MRSA DETECTED 01/09/2017 2049   REPTSTATUS 01/11/2017 FINAL 01/09/2017 2049    CBG:  Recent Labs Lab  01/16/17 0752 01/16/17 1139 01/16/17 1644 01/16/17 2106 01/17/17 0734  GLUCAP 159* 196* 247* 222* 276*   Lab Results  Component Value Date   INR 1.05 01/17/2017   INR 0.98 01/16/2017   INR 1.03 01/12/2017    Medications: . sodium chloride    . sodium chloride    . sodium chloride 10 mL/hr at 01/15/17 0938  . heparin 1,050 Units/hr (01/16/17 2209)  . sodium chloride     . calcitRIOL  0.25 mcg Oral Q M,W,F-HD  . calcium carbonate  400 mg of elemental calcium Oral TID WC  . carbamazepine  200 mg Oral QHS  . darbepoetin (ARANESP) injection - DIALYSIS  40 mcg Intravenous Q Mon-HD  . feeding supplement (NEPRO CARB STEADY)  237 mL Oral TID AC  . gabapentin  300 mg Oral QHS  . insulin aspart  0-7 Units Subcutaneous TID WC  . insulin aspart  3 Units Subcutaneous TID WC  . insulin glargine  10 Units Subcutaneous Daily  . labetalol  300 mg Oral BID  . metoCLOPramide  5 mg Oral TID AC  . ramelteon  8 mg Oral QHS  . senna-docusate  1 tablet Oral Daily  . sevelamer carbonate  800 mg Oral TID WC  . sodium chloride flush  3 mL Intravenous Q12H  . Warfarin - Pharmacist Dosing Inpatient   Does not apply [phone]

## 2017-01-17 NOTE — Progress Notes (Signed)
   Subjective: Patient seen and examined. Patient complaining of post surgical left arm pain, but states it is tolerable when given pain medication and with use of topical voltaren gel.  She has nausea, but no more than usual. Did have one episode of vomiting this morning. She has not had a bowel movement and states she feels constipated. She denies CP, SOB, or palpitations. She also denies vaginal bleeding, hematuria, or epistaxis.   Objective:  Vital signs in last 24 hours: Vitals:   01/16/17 1735 01/16/17 2013 01/16/17 2108 01/17/17 0552  BP: (!) 144/83 (!) 175/105  (!) 152/84  Pulse: 89 91  85  Resp: 18 17  18   Temp: 99.4 F (37.4 C) 98.8 F (37.1 C)  98.7 F (37.1 C)  TempSrc: Oral     SpO2: 100% 100%  99%  Weight:   195 lb (88.5 kg)   Height:       General: Laying in bed with left arm elevated, NAD HEENT: [place]/AT, EOMI, no scleral icterus Cardiac: RRR, No R/M/G appreciated Pulm: normal effort, CTAB Abd: soft, non tender, non distended, BS normal Ext: extremities well perfused, no peripheral edema; left AV graft insertion site clean and dry with minimal oozing, +left lower arm swelling, no erythema, radial pulse intact  Neuro: alert and oriented X3, cranial nerves II-XII grossly intact   Assessment/Plan:  Principal Problem:   Right atrial mass  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 No known cardiac history. Patient has been hemodynamically stable during this admission. TEE on 9/27 showed large atrial mass that is thought to be a thrombus attached to the tip of HD catheter. CTA chest negative for PE, right atrial mass was not well evaluated because was non-dedicated CTA.  Currently on heparin gtt and warfarin with plan to anticoagulate with warfarin for 3 months. Follow up TEE in 3 months. Plan for [facility] removal at that time if clot resolved, if not refer to Dr. Ricard Dillon for options regarding removal. PT 13.6, INR 1.05.  -Continue heparin gtt -Warfarin dosing per  pharmacy -Will remain in the hospital while INR becomes therapeutic -Will confirm with patient that her PCP is Dr. Glendon Axe and that she will require close follow up after d/c  Constipation Patient feeling constipated/nauseas most likely due to opioids and underlying gastroparesis -Start scheduled Senokot 1 tablet daily, will add to regimen if needed   ESRD 2/2 to poorly controlled T1DM and HTN, on HD MWF. HD cath placed on 08/2016.  - S/P POD #2 AV graft insertion >>Percocet q3 PRN for pain management, Topical Voltaren gel  -On exam pt had significant swelling spoke with Dr. Oneida Alar who states this is very common with gore-tex AV  Graft insertions - Removal of TDC in 3 months following anticoagulation if clot resolved  - Nephrology following  T1DM Uncontrolled, complicated by peripheral neuropathy and gastroparesis. Most recent hemoglobin A1C 8.5 (11/2016).  - SQ Lantus 10U qAM + Novolog 3U TID with meals   - SSI-S  - Continue gabapentin 300 MG QHS - Continue antiemetics: reglan, zofran, and phenergan PRN for nausea  HTN -Typically normotensive after dialysis -Continue home labetalol 300 mg BID    F: none E: will continue to monitor N: Renal + CMdiet   VTE ppx: Heparin gtt   Code status: Full code, confirmed on admission   Dispo: Anticipated discharge in approximately 2-3 days.  Carrie Spry, MD 01/17/2017, 7:43 AM Pager: [phone]

## 2017-01-17 NOTE — Progress Notes (Signed)
[facility]  Subjective  - left arm more swollen some numbness in forearm minimal numbness in fingers   Objective (!) 152/84 85 98.7 F (37.1 C) 18 99%  Intake/Output Summary (Last 24 hours) at 01/17/17 1044 Last data filed at 01/17/17 0900  Gross per 24 hour  Intake           413.78 ml  Output              225 ml  Net           188.78 ml   Left forearm incisions intact.  Edema primarily in forearm over graft.  No erythema over graft Minimal edema in hand some edema in left upper arm but not as much as forearm. Hand is warm and pink+-  Assessment/Planning: Most likely her swelling is goretex reaction which should improve over the next week.  She may have a central vein stenosis but would give her at least a month before considering further evaluation of this.  Low likelihood this represents DVT.  Pt currently on heparin.  regardless.  Ruta Hinds 01/17/2017 10:44 AM --  Laboratory Lab Results:  Recent Labs  01/16/17 0536 01/17/17 0451  WBC 5.1 8.1  HGB 8.5* 8.7*  HCT 25.9* 26.8*  PLT 216 218   BMET  Recent Labs  01/15/17 0952 01/15/17 2052  NA 129* 128*  K 4.8 4.6  CL  --  95*  CO2  --  24  GLUCOSE 187* 211*  BUN  --  51*  CREATININE  --  8.08*  CALCIUM  --  8.5*    COAG Lab Results  Component Value Date   INR 1.05 01/17/2017   INR 0.98 01/16/2017   INR 1.03 01/12/2017   No results found for: PTT

## 2017-01-17 NOTE — Progress Notes (Signed)
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  Allergies  Allergen Reactions  . No Known Allergies     Patient Measurements: Height: 5\' 8"  (172.7 cm) Weight: 197 lb 5 oz (89.5 kg) IBW/kg (Calculated) : 63.9  Vital Signs: Temp: 98 F (36.7 C) (10/03 1020) Temp Source: Oral (10/03 1020) BP: 129/82 (10/03 1130) Pulse Rate: 85 (10/03 1130)  Labs:  Recent Labs  01/15/17 0420 01/15/17 7494 01/15/17 2052 01/16/17 0536 01/16/17 2053 01/17/17 0451 01/17/17 1053  HGB 8.6* 9.5*  --  8.5*  --  8.7*  --   HCT 25.4* 28.0*  --  25.9*  --  26.8*  --   PLT 237  --   --  216  --  218  --   LABPROT  --   --   --  12.9  --  13.6  --   INR  --   --   --  0.98  --  1.05  --   HEPARINUNFRC 0.72*  --   --   --  0.17* 0.42 0.39  CREATININE  --   --  8.08*  --   --   --  8.72*    Estimated Creatinine Clearance: 10.6 mL/min (A) (by C-G formula based on SCr of 8.72 mg/dL (H)).  Assessment: 6 YOF with ESRD on HD who was found to have right atrial mass . TEE on 9/27 showed large right atrial mass thought to be a thrombus attached to tip of HD catheter. CT on 9/30 showed no evidence of PE.   POD #2 s/p  L forearm AV graft placement.  Confirmatory 6h heparin level = 0.39, remains therapeuticon heparin drip 1050 units/hr.  Prior Heparin level this AM was 0.42.   No bleeding reported.   Vascular surgery noted today, left arm more swollen some numbness in forearm minimal numbness in fingers, Dr. Oneida Alar evaluated and noted that low likelihood this represents DVT.   Goal of Therapy:  Heparin level 0.3-0.7 units/ml Monitor platelets by anticoagulation protocol: Yes   Plan:  Continue heparin rate at 1050 units/hr -Daily heparin level , PT/INR, CBC while on heparin infusion Continue with coumadin as previously ordered.   Nicole Cella, [place] Clinical Pharmacist Pager:  [phone] 8a-330p [phone] 330p-1030p phone [phone] or [phone] Main pharmacy [phone]  01/17/2017 12:05 PM

## 2017-01-17 NOTE — Progress Notes (Signed)
Patient having some numbness near the incision of her new fistula on left arm. MD aware. Patient able to move fingers and feels me touching her. MD to see patient while in Hemodialysis.  Sheliah Plane RN

## 2017-01-18 ENCOUNTER — Ambulatory Visit: Payer: BLUE CROSS/BLUE SHIELD | Admitting: Endocrinology

## 2017-01-18 ENCOUNTER — Telehealth: Payer: Self-pay | Admitting: Vascular Surgery

## 2017-01-18 LAB — HEPARIN LEVEL (UNFRACTIONATED): Heparin Unfractionated: 0.37 IU/mL (ref 0.30–0.70)

## 2017-01-18 LAB — CBC
HCT: 26.7 % — ABNORMAL LOW (ref 36.0–46.0)
Hemoglobin: 8.8 g/dL — ABNORMAL LOW (ref 12.0–15.0)
MCH: 30.3 pg (ref 26.0–34.0)
MCHC: 33 g/dL (ref 30.0–36.0)
MCV: 92.1 fL (ref 78.0–100.0)
Platelets: 233 K/uL (ref 150–400)
RBC: 2.9 MIL/uL — ABNORMAL LOW (ref 3.87–5.11)
RDW: 16.1 % — ABNORMAL HIGH (ref 11.5–15.5)
WBC: 6.2 K/uL (ref 4.0–10.5)

## 2017-01-18 LAB — PROTIME-INR
INR: 1.45
PROTHROMBIN TIME: 17.5 s — AB (ref 11.4–15.2)

## 2017-01-18 LAB — GLUCOSE, CAPILLARY
Glucose-Capillary: 158 mg/dL — ABNORMAL HIGH (ref 65–99)
Glucose-Capillary: 164 mg/dL — ABNORMAL HIGH (ref 65–99)
Glucose-Capillary: 145 mg/dL — ABNORMAL HIGH (ref 65–99)
Glucose-Capillary: 174 mg/dL — ABNORMAL HIGH (ref 65–99)

## 2017-01-18 MED ORDER — DIPHENHYDRAMINE HCL 25 MG PO CAPS
25.0000 mg | ORAL_CAPSULE | Freq: Four times a day (QID) | ORAL | Status: DC | PRN
  Administered 2017-01-18 – 2017-01-22 (×5): 25 mg via ORAL
  Filled 2017-01-18 (×6): qty 1

## 2017-01-18 MED ORDER — WARFARIN SODIUM 7.5 MG PO TABS
7.5000 mg | ORAL_TABLET | Freq: Once | ORAL | Status: AC
  Administered 2017-01-18: 7.5 mg via ORAL
  Filled 2017-01-18: qty 1

## 2017-01-18 MED ORDER — POLYETHYLENE GLYCOL 3350 17 G PO PACK
17.0000 g | PACK | Freq: Every day | ORAL | Status: DC
  Administered 2017-01-18: 17 g via ORAL
  Filled 2017-01-18: qty 1

## 2017-01-18 NOTE — Progress Notes (Signed)
[facility]  Subjective  - swollen hand   Objective 131/78 85 98.9 F (37.2 C) (Oral) 18 100%  Intake/Output Summary (Last 24 hours) at 01/18/17 0809 Last data filed at 01/18/17 6720  Gross per 24 hour  Intake            896.5 ml  Output             1625 ml  Net           -728.5 ml   + bruit in graft 2+ left radial pulse Edema left upper arm forearm and hand but softer than yesterday   Assessment/Planning: Most likely swelling is goretex reaction.  Could have central vein stenosis but will give this some time to resolve prior to any additional studies.  Continue to elevate arm as much as possible  Ok for d/c from my standpoint  We will schedule outpt appt with me next Thursday  Ruta Hinds 01/18/2017 8:09 AM --  Laboratory Lab Results:  Recent Labs  01/16/17 0536 01/17/17 0451  WBC 5.1 8.1  HGB 8.5* 8.7*  HCT 25.9* 26.8*  PLT 216 218   BMET  Recent Labs  01/15/17 2052 01/17/17 1053  NA 128* 127*  K 4.6 4.1  CL 95* 91*  CO2 24 25  GLUCOSE 211* 234*  BUN 51* 45*  CREATININE 8.08* 8.72*  CALCIUM 8.5* 8.7*    COAG Lab Results  Component Value Date   INR 1.45 01/18/2017   INR 1.05 01/17/2017   INR 0.98 01/16/2017   No results found for: PTT

## 2017-01-18 NOTE — Progress Notes (Signed)
Inpatient Diabetes Program Recommendations  AACE/ADA: New Consensus Statement on Inpatient Glycemic Control (2015)  Target Ranges:  Prepandial:   less than 140 mg/dL      Peak postprandial:   less than 180 mg/dL (1-2 hours)      Critically ill patients:  140 - 180 mg/dL   Lab Results  Component Value Date   GLUCAP 164 (H) 01/18/2017   HGBA1C 8.5 12/12/2016    Review of Glycemic Control Results for Carrie [name], Carrie [name] (MRN [id number]) as of 01/18/2017 12:08  Ref. Range 01/17/2017 14:24 01/17/2017 16:39 01/17/2017 20:21 01/18/2017 08:03 01/18/2017 12:00  Glucose-Capillary Latest Ref Range: 65 - 99 mg/dL 145 (H) 259 (H) 199 (H) 158 (H) 164 (H)    Inpatient Diabetes Program Recommendations:   Spoke with patient @ bedside regarding insulin regimen. Patient states she used to take Lantus 10-30 units daily (if didn't eat, took 10 units). States she starting having low CBGs and Dr. Loanne Drilling changed her regimen to Novolog NPH 20 units/day. Patient states she would prefer going back on Lantus + Novolog for meals.  Recommend: -Continue Lantus 10 units daily and adjust as needed -Increase Novolog to 4 units tid meal coverage if eats 50%  Thank you, Bethena Roys E. Rylan Kaufmann, RN, MSN, CDE  Diabetes Coordinator Inpatient Glycemic Control Team Team Pager [phone] (8am-5pm) 01/18/2017 12:13 PM

## 2017-01-18 NOTE — Progress Notes (Signed)
ANTICOAGULATION CONSULT NOTE -     Pharmacy Consult for Heparin /Coumadin  Indication: R-atrial mass thought to be TDC thrombus  Allergies  Allergen Reactions  . No Known Allergies     Patient Measurements: Height: 5\' 8"  (172.7 cm) Weight: 196 lb (88.9 kg) IBW/kg (Calculated) : 63.9  Vital Signs: Temp: 98.8 F (37.1 C) (10/04 0900) Temp Source: Oral (10/04 0900) BP: 115/52 (10/04 0900) Pulse Rate: 85 (10/04 0900)  Labs:  Recent Labs  01/15/17 2052  01/16/17 0536  01/17/17 0451 01/17/17 1053 01/18/17 0410 01/18/17 0812  HGB  --   < > 8.5*  --  8.7*  --   --  8.8*  HCT  --   --  25.9*  --  26.8*  --   --  26.7*  PLT  --   --  216  --  218  --   --  233  LABPROT  --   --  12.9  --  13.6  --  17.5*  --   INR  --   --  0.98  --  1.05  --  1.45  --   HEPARINUNFRC  --   --   --   < > 0.42 0.39 0.37  --   CREATININE 8.08*  --   --   --   --  8.72*  --   --   < > = values in this interval not displayed.  Estimated Creatinine Clearance: 10.6 mL/min (A) (by C-G formula based on SCr of 8.72 mg/dL (H)).  Assessment: 87 YOF with ESRD on HD who was found to have right atrial mass . TEE on 9/27 showed large right atrial mass thought to be a thrombus attached to tip of HD catheter. CT on 9/30 showed no evidence of PE. Pharmacy is on board for heparin bridge to a therapeutic INR with warfarin.   The patient's heparin level today remains therapeutic (HL 0.37 << 0.39, goal of 0.3-0.7), INR today is SUBtherapeutic though trending up (INR 1.45 << 1.05, goal of 2-3). CBC stable.  Goal of Therapy:  Heparin level 0.3-0.7 units/ml Monitor platelets by anticoagulation protocol: Yes   Plan:  1. Continue Heparin at 1050 units/hr (10.5 ml/hr) 2. Warfarin 7.5 mg x 1 dose at 1800 today 3. Will continue to monitor for any signs/symptoms of bleeding and will follow up with heparin level and PT/INR in the a.m.   Thank you for allowing pharmacy to be a part of this patient's care.  Alycia Rossetti, PharmD, BCPS Clinical Pharmacist Pager: [phone] Clinical phone for 01/18/2017 from 7a-3:30p: [phone] If after 3:30p, please call main pharmacy at: [phone] 01/18/2017 12:13 PM

## 2017-01-18 NOTE — Telephone Encounter (Signed)
-----   Message from Mena Goes, RN sent at 01/18/2017  9:33 AM EDT ----- Regarding: appt next thursday   ----- Message ----- From: Elam Dutch, MD Sent: 01/18/2017   9:04 AM To: Vvs Charge Pool  Pt needs appt with me next Thursday to check left arm swelling  Ruta Hinds

## 2017-01-18 NOTE — Progress Notes (Signed)
Subjective: Patient seen and examined. Patient still having post surgical left arm pain and swelling and states the swelling is now up to her shoulder. The wrist numbness she started experiencing is still there but has not progressed. She states it is tolerable with pain medication and with use of topical voltaren gel. She has not had a bowel movement and states she still feels constipated, but is passing gas. She denies CP, SOB, or palpitations. She also denies vaginal bleeding, hematuria, or epistaxis.   Objective:  Vital signs in last 24 hours: Vitals:   01/17/17 1310 01/17/17 2050 01/18/17 0619 01/18/17 0900  BP: (!) 145/82 (!) 167/91 131/78 (!) 115/52  Pulse: 88 90 85 85  Resp: 18 18 18 18   Temp: 98 F (36.7 C) 99.3 F (37.4 C) 98.9 F (37.2 C) 98.8 F (37.1 C)  TempSrc: Oral Oral Oral Oral  SpO2: 100% 100% 100% 100%  Weight: 194 lb 0.1 oz (88 kg) 196 lb (88.9 kg)    Height:       General: Laying in bed with left arm elevated, NAD HEENT: [place]/AT, EOMI, no scleral icterus Cardiac: RRR, No R/M/G appreciated Pulm: normal effort, CTAB Abd: soft, non tender, non distended, BS normal Ext: extremities well perfused, no peripheral edema; left AV graft insertion site clean and dry with minimal oozing, +left forearm swelling now with swelling in upper arm, no erythema, radial pulse intact, hand is warm, grip strength 5/5 in left hand Neuro: alert and oriented X3, cranial nerves II-XII grossly intact   Assessment/Plan:  Principal Problem:   Right atrial mass  Right atrial mass TEE on 9/27 showed large atrial mass that is most likely a thrombus attached to the tip of HD catheter. CTA chest negative for PE, right atrial mass was not well evaluated because was non-dedicated CTA.  Currently on heparin gtt and warfarin with plan to anticoagulate with warfarin for 3 months. Follow up TEE in 3 months. Plan for [facility] removal at that time if clot resolved, if not refer to CT surgery for options  regarding removal. PT 17.5, INR 1.45.  -Continue heparin gtt -Warfarin dosing per pharmacy -Will remain in the hospital while INR becomes therapeutic -Will speak to patient's PCP regarding follow up and if coumadin can be managed by them  Constipation Patient feeling constipated/nauseas most likely due to opioids and underlying gastroparesis -Start scheduled Senokot 1 tablet daily -Will add Miralax daily    ESRD 2/2 to poorly controlled T1DM and HTN, on HD MWF. HD cath placed on 08/2016.  - S/P POD #3 AV graft insertion >>Percocet q3 PRN for pain management, Topical Voltaren gel -On exam pt continues to have swelling of forearm and now upper extremity, will continue to monitor  - Removal of TDC in 3 months following anticoagulation if clot resolved  - Nephrology following>>will contact if INR can be monitored through dialysis  T1DM Uncontrolled, complicated by peripheral neuropathy and gastroparesis. Most recent hemoglobin A1C 8.5 (11/2016).  - SQ Lantus 10U qAM + Novolog 3U TID with meals   - SSI-S  - Continue gabapentin 300 MG QHS - Continue antiemetics: reglan, zofran, and phenergan PRN for nausea -  Diabetes educator consulted to clarify patient's home regimen  HTN -Typically normotensive after dialysis -Continue home labetalol 300 mg BID    F: none E: will continue to monitor N: Renal + CMdiet   VTE ppx: Heparin gtt   Code status: Full code, confirmed on admission   Dispo: Anticipated discharge in approximately  2-3 days.  Melanee Spry, MD 01/18/2017, 11:11 AM Pager: [phone]

## 2017-01-18 NOTE — Progress Notes (Signed)
[name] Progress Note   Dialysis Orders:    MWF -[facility]              4hrs BFR 400, DFR 800, EDW 84kg, 2K/2Ca             TDC, L AVF maturing              Heparin 2500 U IV bolus             Calcitriol 0.10mcg PO qHD              Last labs: 9/19: hgb 11.1, 9/12: Ca 9.8, P 5.7, 8/22: TSAT 31%, Alb 2.8, PTH 282  Assessment/Plan: 1. R atrial echodensity mass -TEE on 9/28 mass consistent with thrombus likely related to [facility]. For anticoagulation then repeat TEE later (3 months) to see if clot has resolved before removing the catheter.  2. ESRD- MWF -LU AVG placement by Dr. Oneida Alar on 10/1. Should be ready to stick in 1 month. [facility] following.  Note that the [facility] should not be pulled when the AVG is successfully used in this case, given clot on cath tip and risk of embolization , see plan above.   -HD yesterday, signed off early due to nausea/lightheadedness, 12.5 IV phenergan given. Net UF 1.5L, post weight 88kg.  Continue HD on regular schedule MWF, while admitted, orders written.  3. Hypertension/volume -BP better this am. Cont labetalol.  -Continues to not meet EDW, titrate down volume. Will need to reassess EDW at d/c. 4. Anemia- Hgb 8.8, trending back up slowly. Follow. - Aranesp 18mcg IV qwk started on 10/1 (Mon). 5. Metabolic bone disease- -Ca, P and PTH close to goal. Continue TUMS, and calcitriol 0.48mcg qHD. Labs pre HD today.  6. Nutrition- Nepro TID, Renavite, and Renal diet.  7. Type 1 DM- on insulin, managed by PCP 8. Gastroparesis -On Reglan, Phenergan and Zofran  9.   Left arm swelling/ edema - after L AVG insertion, per [facility]VS likely having allergic reaction to the Goretex.  Supportive care.  She has f/u appt with [facility] in their clinic next week on Thursday.    Carrie Mow, PA-C [name] 01/18/2017,9:35 AM  LOS: 6 days   Pt seen,  examined, agree w assess/plan as above with additions as indicated.  Carrie Splinter MD [name] pager [phone]    cell [phone] 01/18/2017, 1:09 PM     Subjective:   Patient states she feels better today.  C/o left arm pain/swelling, now up to shoulder, but does not feel as tight. Also has had some drainage from sutures. Denies SOB, edema, and weakness.  Objective Vitals:   01/17/17 1300 01/17/17 1310 01/17/17 2050 01/18/17 0619  BP: (!) 93/52 (!) 145/82 (!) 167/91 131/78  Pulse: 80 88 90 85  Resp:  18 18 18   Temp:  98 F (36.7 C) 99.3 F (37.4 C) 98.9 F (37.2 C)  TempSrc:  Oral Oral Oral  SpO2:  100% 100% 100%  Weight:  88 kg (194 lb 0.1 oz) 88.9 kg (196 lb)   Height:       Physical Exam General:NAD, WDWN Heart:RRR Lungs:CTA b/l Abdomen:soft, NT Extremities:LE: non pitting edema b/l. LUE: +edema from hand to shoulder, soft, no erythema, minimal serosanguinous drainage from sutures, +RP, +sensation in all digits, hand warm Dialysis Access: TDC, LU AVG, +bruit   Filed Weights   01/17/17 1020 01/17/17 1310 01/17/17 2050  Weight: 89.5 kg (197 lb 5 oz) 88 kg (194 lb 0.1  oz) 88.9 kg (196 lb)    Intake/Output Summary (Last 24 hours) at 01/18/17 0935 Last data filed at 01/18/17 7371  Gross per 24 hour  Intake            896.5 ml  Output             1500 ml  Net           -603.5 ml    Additional Objective Labs: Basic Metabolic Panel:  Recent Labs Lab 01/12/17 0459 01/15/17 0952 01/15/17 2052 01/17/17 1053  NA 131* 129* 128* 127*  K 4.2 4.8 4.6 4.1  CL 91*  --  95* 91*  CO2 25  --  24 25  GLUCOSE 163* 187* 211* 234*  BUN 52*  --  51* 45*  CREATININE 8.27*  --  8.08* 8.72*  CALCIUM 9.1  --  8.5* 8.7*  PHOS 7.4*  --  4.3 5.8*   Liver Function Tests:  Recent Labs Lab 01/12/17 0459 01/15/17 2052 01/17/17 1053  ALBUMIN 3.6 3.1* 2.9*   CBC:  Recent Labs Lab 01/14/17 0237 01/15/17 0420  01/16/17 0536 01/17/17 0451 01/18/17 0812   WBC 5.7 6.1  --  5.1 8.1 6.2  HGB 9.4* 8.6*  < > 8.5* 8.7* 8.8*  HCT 28.9* 25.4*  < > 25.9* 26.8* 26.7*  MCV 90.9 90.1  --  90.2 90.8 92.1  PLT 244 237  --  216 218 233  < > = values in this interval not displayed. Blood Culture    Component Value Date/Time   SDES NASAL SWAB 01/09/2017 2049   [place] NONE 01/09/2017 2049   CULT NO MRSA DETECTED 01/09/2017 2049   REPTSTATUS 01/11/2017 FINAL 01/09/2017 2049    CBG:  Recent Labs Lab 01/17/17 0734 01/17/17 1424 01/17/17 1639 01/17/17 2021 01/18/17 0803  GLUCAP 276* 145* 259* 199* 158*    Lab Results  Component Value Date   INR 1.45 01/18/2017   INR 1.05 01/17/2017   INR 0.98 01/16/2017   Studies/Results:  Medications: . sodium chloride    . sodium chloride    . sodium chloride 10 mL/hr at 01/15/17 0938  . heparin 1,050 Units/hr (01/17/17 1252)  . sodium chloride     . calcitRIOL  0.25 mcg Oral Q M,W,F-HD  . calcium carbonate  400 mg of elemental calcium Oral TID WC  . carbamazepine  200 mg Oral QHS  . darbepoetin (ARANESP) injection - DIALYSIS  40 mcg Intravenous Q Mon-HD  . feeding supplement (NEPRO CARB STEADY)  237 mL Oral TID AC  . gabapentin  300 mg Oral QHS  . insulin aspart  0-7 Units Subcutaneous TID WC  . insulin aspart  3 Units Subcutaneous TID WC  . insulin glargine  10 Units Subcutaneous Daily  . labetalol  300 mg Oral BID  . metoCLOPramide  5 mg Oral TID AC  . promethazine  12.5 mg Intravenous Once in dialysis  . ramelteon  8 mg Oral QHS  . senna-docusate  1 tablet Oral Daily  . sevelamer carbonate  800 mg Oral TID WC  . sodium chloride flush  3 mL Intravenous Q12H  . warfarin   Does not apply Once  . Warfarin - Pharmacist Dosing Inpatient   Does not apply [phone]

## 2017-01-18 NOTE — Telephone Encounter (Signed)
Sched appt 01/25/17 at 10:00. Spoke to pt and mailed appt letter through regular mail as per pt's request.

## 2017-01-19 DIAGNOSIS — E871 Hypo-osmolality and hyponatremia: Secondary | ICD-10-CM

## 2017-01-19 LAB — BASIC METABOLIC PANEL
ANION GAP: 7 (ref 5–15)
BUN: 30 mg/dL — ABNORMAL HIGH (ref 6–20)
CO2: 27 mmol/L (ref 22–32)
Calcium: 8.7 mg/dL — ABNORMAL LOW (ref 8.9–10.3)
Chloride: 91 mmol/L — ABNORMAL LOW (ref 101–111)
Creatinine, Ser: 8.43 mg/dL — ABNORMAL HIGH (ref 0.44–1.00)
GFR calc Af Amer: 6 mL/min — ABNORMAL LOW (ref >60)
GFR, EST NON AFRICAN AMERICAN: 6 mL/min — AB (ref >60)
Glucose, Bld: 215 mg/dL — ABNORMAL HIGH (ref 65–99)
POTASSIUM: 4 mmol/L (ref 3.5–5.1)
Sodium: 125 mmol/L — ABNORMAL LOW (ref 135–145)

## 2017-01-19 LAB — PROTIME-INR
INR: 1.58
Prothrombin Time: 18.8 seconds — ABNORMAL HIGH (ref 11.4–15.2)

## 2017-01-19 LAB — GLUCOSE, CAPILLARY
Glucose-Capillary: 213 mg/dL — ABNORMAL HIGH (ref 65–99)
Glucose-Capillary: 258 mg/dL — ABNORMAL HIGH (ref 65–99)

## 2017-01-19 LAB — HEPARIN LEVEL (UNFRACTIONATED): Heparin Unfractionated: 0.51 IU/mL (ref 0.30–0.70)

## 2017-01-19 MED ORDER — POLYETHYLENE GLYCOL 3350 17 G PO PACK
17.0000 g | PACK | Freq: Two times a day (BID) | ORAL | Status: DC
  Administered 2017-01-19 – 2017-01-23 (×7): 17 g via ORAL
  Filled 2017-01-19 (×9): qty 1

## 2017-01-19 MED ORDER — CALCITRIOL 0.25 MCG PO CAPS
ORAL_CAPSULE | ORAL | Status: AC
  Filled 2017-01-19: qty 1

## 2017-01-19 MED ORDER — OXYCODONE-ACETAMINOPHEN 5-325 MG PO TABS
1.0000 | ORAL_TABLET | ORAL | Status: DC | PRN
  Administered 2017-01-19 – 2017-01-21 (×9): 1 via ORAL
  Filled 2017-01-19 (×8): qty 1

## 2017-01-19 MED ORDER — WARFARIN SODIUM 10 MG PO TABS
10.0000 mg | ORAL_TABLET | Freq: Once | ORAL | Status: AC
  Administered 2017-01-19: 10 mg via ORAL
  Filled 2017-01-19: qty 1

## 2017-01-19 MED ORDER — SENNOSIDES-DOCUSATE SODIUM 8.6-50 MG PO TABS
2.0000 | ORAL_TABLET | Freq: Every day | ORAL | Status: DC
  Administered 2017-01-20 – 2017-01-24 (×5): 2 via ORAL
  Filled 2017-01-19 (×5): qty 2

## 2017-01-19 NOTE — Progress Notes (Signed)
Subjective: Patient seen and examined. Patient states her left arm pain and swelling improved with benadryl. She has not had a bowel movement and states she still feels constipated, but is passing gas. She denies CP, SOB, or palpitations. She declined dialysis this morning due to nausea.   Objective:  Vital signs in last 24 hours: Vitals:   01/18/17 0900 01/18/17 1829 01/18/17 2247 01/19/17 0443  BP: (!) 115/52 (!) 151/89 (!) 143/90 (!) 163/82  Pulse: 85 88 91 86  Resp: 18 18 16 16   Temp: 98.8 F (37.1 C) 99 F (37.2 C) 98.4 F (36.9 C) 98.9 F (37.2 C)  TempSrc: Oral Oral Oral Oral  SpO2: 100% 100% 100% 99%  Weight:   198 lb 6.4 oz (90 kg)   Height:       General: Sitting up in bed with left arm elevated, NAD HEENT: [place]/AT, EOMI, no scleral icterus Cardiac: RRR, No R/M/G appreciated Pulm: normal effort, CTAB Abd: soft, non tender, non distended, BS normal Ext: extremities well perfused, no peripheral edema; left AV graft insertion site clean and dry, +left forearm swelling now with swelling in upper arm but improving, no erythema, radial pulse intact, hand is warm, grip strength 5/5 in left hand Neuro: alert and oriented X3, cranial nerves II-XII grossly intact   Assessment/Plan:  Principal Problem:   Right atrial mass  Right atrial mass TEE on 9/27 showed large atrial mass that is most likely a thrombus attached to the tip of HD catheter. CTA chest negative for PE, right atrial mass was not well evaluated because was non-dedicated CTA.  Currently on heparin gtt and warfarin with plan to anticoagulate with warfarin for 3 months. Follow up TEE in 3 months. Plan for [facility] removal at that time if clot resolved, if not refer to CT surgery for options regarding removal. PT 18.8, INR 1.58. Spoke directly to patient's PCP Dr. Tamala Julian and updated her about the patient's hospital course. Dr. Tamala Julian agreed to manage her Coumadin as an outpatient with goal of INR between 2-3.  -Continue  heparin gtt -Warfarin dosing per pharmacy -Will remain in the hospital while INR becomes therapeutic -PT/INR can be drawn during dialysis, but receive Warfarin management through the PCP confirmed    Constipation Patient feeling constipated/nauseas most likely due to opioids and underlying gastroparesis -Senokot 2 tableta daily -Miralax BID   Hyponatremia Sodium 125 today, has been steadily declining the past several days. Patient asymptomatic. Will continue to monitor no intervention at this time.  -Repeat BMET in AM  ESRD 2/2 to poorly controlled T1DM and HTN, on HD MWF. HD cath placed on 08/2016.  - S/P POD #4 AV graft insertion >>Percocet q4 PRN for pain management, Topical Voltaren gel, Benadryl q6 hours PRN  -On exam pt continues to have swelling but is less tense than yesterday - Removal of TDC in 3 months following anticoagulation if clot resolved  - Nephrology following  T1DM Uncontrolled, complicated by peripheral neuropathy and gastroparesis. Most recent hemoglobin A1C 8.5 (11/2016).  Patient seen by diabetes educator will appreciate recommendations upon discharge.  - SQ Lantus 10U qAM + Novolog 3U TID with meals   - SSI-S  - Continue gabapentin 300 MG QHS - Continue antiemetics: reglan, zofran, and phenergan PRN for nausea  HTN -Typically normotensive after dialysis -Continue home labetalol 300 mg BID    F: none E: will continue to monitor N: Renal + CMdiet   VTE ppx: Heparin gtt   Code status: Full code, confirmed  on admission   Dispo: Anticipated discharge in approximately 2-3 days.  Melanee Spry, MD 01/19/2017, 8:26 AM Pager: [phone]

## 2017-01-19 NOTE — Progress Notes (Signed)
[facility] Progress Note   Dialysis Orders:    MWF -[facility]              4hrs BFR 400, DFR 800, EDW 84kg, 2K/2Ca             TDC, L AVF maturing              Heparin 2500 U IV bolus             Calcitriol 0.70mcg PO qHD              Last labs: 9/19: hgb 11.1, 9/12: Ca 9.8, P 5.7, 8/22: TSAT 31%, Alb 2.8, PTH 282  Assessment: 1. R atrial clot -TEE on 9/28 mass consistent with thrombus likely related to [facility]. For anticoagulation then repeat TEE later (3 months) to see if clot has resolved before removing the catheter.  2. ESRD- MWF -LU AVG placement by Dr. Oneida Alar on 10/1. Should be ready to stick in 1 month. [facility] following.  Note that the [facility] should not be pulled when the AVG is successfully used in this case, given clot on cath tip and risk of embolization , see plan above.   3. Hypertension/volume -Cont labetalol.  -Vol overloaded, up 6- 10 kg, w low serum Na. Max UF w HD today, get stand wt post HD 4.   Anemia - Hgb 8.8, trending back up slowly. Follow. - Aranesp 58mcg IV qwk started on 10/1 (Mon). 5. Metabolic bone disease- -Ca, P and PTH close to goal. Continue TUMS, and calcitriol 0.33mcg qHD. Labs pre HD today.  6. Nutrition- Nepro TID, Renavite, and Renal diet.  7. Type 1 DM- on insulin, managed by PCP 8. Gastroparesis -On Reglan, Phenergan and Zofran  9.   Left arm swelling/ edema - after L AVG insertion, per [facility] suspect allergic reaction to the Goretex.  Supportive care.  She has f/u appt with [facility] in their clinic next week on Thursday.     Plan - as above  [name] MD [employer] pgr [phone]   01/19/2017, 11:34 AM         Subjective:   Feeling at little better, L arm less itchy.    Objective Vitals:   01/19/17 0443 01/19/17 1000 01/19/17 1021 01/19/17 1030  BP: (!) 163/82 120/68 132/79 128/75  Pulse: 86 87 88 85  Resp:  16 18 17 18   Temp: 98.9 F (37.2 C) 98 F (36.7 C) 98 F (36.7 C)   TempSrc: Oral Oral    SpO2: 99% 99% 98%   Weight:  95.6 kg (210 lb 12.2 oz)    Height:       Physical Exam General:NAD, WDWN Heart:RRR Lungs:CTA b/l Abdomen:soft, NT Extremities:LE: non pitting edema b/l. LUE: +edema from hand to shoulder, soft, no erythema, minimal serosanguinous drainage from sutures, +RP, +sensation in all digits, hand warm Dialysis Access: TDC, LU AVG, +bruit   Filed Weights   01/17/17 2050 01/18/17 2247 01/19/17 1000  Weight: 88.9 kg (196 lb) 90 kg (198 lb 6.4 oz) 95.6 kg (210 lb 12.2 oz)    Intake/Output Summary (Last 24 hours) at 01/19/17 1132 Last data filed at 01/19/17 1000  Gross per 24 hour  Intake              758 ml  Output                0 ml  Net  758 ml    Additional Objective Labs: Basic Metabolic Panel:  Recent Labs Lab 01/15/17 2052 01/17/17 1053 01/19/17 0434  NA 128* 127* 125*  K 4.6 4.1 4.0  CL 95* 91* 91*  CO2 24 25 27   GLUCOSE 211* 234* 215*  BUN 51* 45* 30*  CREATININE 8.08* 8.72* 8.43*  CALCIUM 8.5* 8.7* 8.7*  PHOS 4.3 5.8*  --    Liver Function Tests:  Recent Labs Lab 01/15/17 2052 01/17/17 1053  ALBUMIN 3.1* 2.9*   CBC:  Recent Labs Lab 01/14/17 0237 01/15/17 0420  01/16/17 0536 01/17/17 0451 01/18/17 0812  WBC 5.7 6.1  --  5.1 8.1 6.2  HGB 9.4* 8.6*  < > 8.5* 8.7* 8.8*  HCT 28.9* 25.4*  < > 25.9* 26.8* 26.7*  MCV 90.9 90.1  --  90.2 90.8 92.1  PLT 244 237  --  216 218 233  < > = values in this interval not displayed. Blood Culture    Component Value Date/Time   SDES NASAL SWAB 01/09/2017 2049   [place] NONE 01/09/2017 2049   CULT NO MRSA DETECTED 01/09/2017 2049   REPTSTATUS 01/11/2017 FINAL 01/09/2017 2049    CBG:  Recent Labs Lab 01/18/17 0803 01/18/17 1200 01/18/17 1745 01/18/17 2242 01/19/17 0815  GLUCAP 158* 164* 145* 174* 213*    Lab Results  Component Value Date   INR 1.58 01/19/2017    INR 1.45 01/18/2017   INR 1.05 01/17/2017   Studies/Results:  Medications: . sodium chloride    . sodium chloride    . sodium chloride 10 mL/hr at 01/15/17 0938  . heparin 1,050 Units/hr (01/18/17 2200)  . sodium chloride     . calcitRIOL  0.25 mcg Oral Q M,W,F-HD  . calcium carbonate  400 mg of elemental calcium Oral TID WC  . carbamazepine  200 mg Oral QHS  . darbepoetin (ARANESP) injection - DIALYSIS  40 mcg Intravenous Q Mon-HD  . feeding supplement (NEPRO CARB STEADY)  237 mL Oral TID AC  . gabapentin  300 mg Oral QHS  . insulin aspart  0-7 Units Subcutaneous TID WC  . insulin aspart  3 Units Subcutaneous TID WC  . insulin glargine  10 Units Subcutaneous Daily  . labetalol  300 mg Oral BID  . metoCLOPramide  5 mg Oral TID AC  . polyethylene glycol  17 g Oral BID  . promethazine  12.5 mg Intravenous Once in dialysis  . ramelteon  8 mg Oral QHS  . senna-docusate  2 tablet Oral Daily  . sevelamer carbonate  800 mg Oral TID WC  . sodium chloride flush  3 mL Intravenous Q12H  . Warfarin - Pharmacist Dosing Inpatient   Does not apply [phone]

## 2017-01-19 NOTE — Progress Notes (Signed)
ANTICOAGULATION CONSULT NOTE -     Pharmacy Consult for Heparin /Coumadin  Indication: R-atrial mass thought to be TDC thrombus  Allergies  Allergen Reactions  . No Known Allergies     Patient Measurements: Height: 5\' 8"  (172.7 cm) Weight: 210 lb 12.2 oz (95.6 kg) IBW/kg (Calculated) : 63.9  Vital Signs: Temp: 98 F (36.7 C) (10/05 1021) Temp Source: Oral (10/05 1000) BP: 118/56 (10/05 1330) Pulse Rate: 89 (10/05 1330)  Labs:  Recent Labs  01/17/17 0451 01/17/17 1053 01/18/17 0410 01/18/17 0812 01/19/17 0434  HGB 8.7*  --   --  8.8*  --   HCT 26.8*  --   --  26.7*  --   PLT 218  --   --  233  --   LABPROT 13.6  --  17.5*  --  18.8*  INR 1.05  --  1.45  --  1.58  HEPARINUNFRC 0.42 0.39 0.37  --  0.51  CREATININE  --  8.72*  --   --  8.43*    Estimated Creatinine Clearance: 11.4 mL/min (A) (by C-G formula based on SCr of 8.43 mg/dL (H)).  Assessment: 10 YOF with ESRD on HD who was found to have right atrial mass . TEE on 9/27 showed large right atrial mass thought to be a thrombus attached to tip of HD catheter. CT on 9/30 showed no evidence of PE. Pharmacy is on board for heparin bridge to a therapeutic INR with warfarin.   The patient's heparin level today remains therapeutic (HL 0.51 << 0.37, goal of 0.3-0.7), INR today is SUBtherapeutic though trending up (INR 1.58 << 1.45, goal of 2-3). CBC stable.  Goal of Therapy:  Heparin level 0.3-0.7 units/ml Monitor platelets by anticoagulation protocol: Yes   Plan:  1. Continue Heparin at 1050 units/hr (10.5 ml/hr) 2. Warfarin 10 mg x 1 dose at 1800 today 3. Will continue to monitor for any signs/symptoms of bleeding and will follow up with heparin level and PT/INR in the a.m.   Thank you for allowing pharmacy to be a part of this patient's care.  Alycia Rossetti, PharmD, BCPS Clinical Pharmacist Pager: [phone] Clinical phone for 01/19/2017 from 7a-3:30p: [phone] If after 3:30p, please call main pharmacy at:  [phone] 01/19/2017 2:56 PM

## 2017-01-19 NOTE — Discharge Summary (Signed)
Name: Carrie [name] MRN: [id number] DOB: 1980-11-15 36 y.o. PCP: Glendon Axe, MD  Date of Admission: 01/09/2017 12:59 PM Date of Discharge: 01/24/2017 Attending Physician: Annia Belt, MD  Discharge Diagnosis: 1. 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   carbamazepine 400 MG 12 hr tablet Commonly known as:  TEGRETOL XR Take 200 mg by mouth once.   diclofenac sodium 1 % Gel Commonly known as:  VOLTAREN Apply 2 g topically 4 (four) times daily.   diphenhydrAMINE 25 MG tablet Commonly known as:  BENADRYL Take 50 mg by mouth at bedtime as needed for sleep.   diphenhydramine-acetaminophen 25-500 MG Tabs tablet Commonly known as:  TYLENOL PM Take 2 tablets by mouth at bedtime.   gabapentin 300 MG capsule Commonly known as:  NEURONTIN Take 1 capsule (300 mg total) by mouth at bedtime. What changed:  when to take this   insulin aspart 100 UNIT/ML FlexPen Commonly known as:  NOVOLOG FLEXPEN Inject 5 Units into the skin 3 (three) times daily with meals. And pen needles 4/day What changed:  how much to take  when to take this  additional instructions   insulin aspart 100 UNIT/ML injection Commonly known as:  novoLOG Inject 3 Units into the skin 3 (three) times daily with meals. What changed:  You were already taking a medication with the same name, and this prescription was added. Make sure you understand how and when to take each.   Insulin Glargine  100 UNIT/ML Solostar Pen Commonly known as:  LANTUS SOLOSTAR Inject 10 Units into the skin daily at 10 pm. What changed:  Another medication with the same name was added. Make sure you understand how and when to take each.   insulin glargine 100 UNIT/ML injection Commonly known as:  LANTUS Inject 0.12 mLs (12 Units total) into the skin daily. What changed:  You were already taking a medication with the same name, and this prescription was added. Make sure you understand how and when to take each.   labetalol 300 MG tablet Commonly known as:  NORMODYNE Take 300 mg by mouth See admin instructions. Take 300mg  tablet at bedtime on diaylsis days M, W, F. Take 300mg  tablet twice daily on all other days   ondansetron 4 MG tablet Commonly known as:  ZOFRAN Take 1 tablet (4 mg total) by mouth every 8 (eight) hours as needed for nausea or vomiting.   oxyCODONE-acetaminophen 5-325 MG tablet Commonly known as:  PERCOCET/ROXICET Take 1-2 tablets by mouth every 6 (six) hours as needed for severe pain.   promethazine 25 MG tablet Commonly known as:  PHENERGAN Take 1 tablet (25 mg total) by mouth every 8 (eight) hours as needed for nausea or vomiting.   REGLAN 5 MG tablet Generic drug:  metoCLOPramide Take 5 mg by mouth every 6 (six) hours as needed (nausea).   sevelamer carbonate 800 MG  tablet Commonly known as:  RENVELA Take 800 mg by mouth 3 (three) times daily with meals.       Disposition and follow-up:   Carrie [name] was discharged from [facility] in Good condition.  At the hospital follow up visit please address:  1.  Warfarin: Discharged on 7.5 mg, will require 3 months of anticoagulation with INR between 2-3, Vascular surgery follow up   2.  Labs / imaging needed at time of follow-up: PT/INR   3.  Pending labs/ test needing follow-up: None  Follow-up Appointments: Follow-up Information    Elam Dutch, MD Follow up.   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 Right atrial mass Carrie [name] was admitted to [facility] and the Internal Medicine Teaching service for right atrial mass found on echocardiogram. The patient was being evaluated at [facility] for renal transplant and was found to have a right atrial mass during echocardigoram. She declined admission at [facility] and chose to be evaluated at [facility]. She was hemodynamically stable upon arrival and remained stable throughout hospitalization. Cardiology was consulted and recommended TEE for further evaluation, which revealed a large 2.8 cm (L) x 1.7 cm (W), spherical, fixed mass, with a mobile thrombus assosciated distally with mass. Cardiothoracic surgery was consulted and recommended a heparin drip and CTA of the chest to rule out PE. CTA was negative for pulmonary emboli. The patient was maintained on a heparin drip until warfarin became therapeutic. She had no bleeding events. Carrie [name] will require anticoagulation for three months with a follow up TEE at that time to evaluate if surgical extraction is required.   The patient's PCP Dr. Winifred Olive will be managing her warfarin. She was discharge with an INR of 2.45 on 7.5 mg of Warfarin daily.   2. End Stage Renal Disease The patient was maintained on a Monday, Wednesday, Friday dialysis schedule that was managed by Nephrology. She had a left AV gore-tex graft insertion. The patient tolerated the procedure well, but had significant post operative swelling in her left arm and hand, which is common for gore-tex graft insertions. There were no signs of infection. She was followed closely by Vascular  Surgery.  She has a follow up appointment with Vascular surgery on 01/25/2017. Note that the patient's [facility] should not be pulled when the AVG is successfully used in this case, given the clot on TDC tip and risk of embolization, please see plan above.  3. Type 1 Diabetes Mellitus Uncontrolled type 1 diabetes complicated by peripheral neuropathy and gastroparesis. Most recent Hemoglobin A1C 8.5. CBGs were stable through admission ranging between 150-250s. The patient had one episode of hypoglycemia (CBG of 38) when given her home dose of Lantus 20 units. She was maintained on a regimen of SQ Lantus 10 units in the AM and Novolog 3 units TID with meals. She was seen by the hospitals diabetes educator. She was discharged on 12 units of Lantus daily and 3 units of Novolog three times daily with meals.   4. Hypertension The patient's blood pressures were well controlled with dialysis MWF  and Labetalol 300 mg daily.   Discharge Vitals:   BP 113/65 (BP Location: Right Arm)   Pulse 87   Temp 98.7 F (37.1 C) (Oral)   Resp 18   Ht 5\' 8"  (1.727 m)   Wt 195 lb 1.7 oz (88.5 kg) Comment: standing  LMP 07/17/2016 (Within Weeks) Comment: Has not had a period since starting dialysis in May  SpO2 96%   BMI 29.67 kg/m   Pertinent Labs, Studies, and Procedures:   Transesophageal Echocardiogram Study Conclusions - Left ventricle: The cavity size was normal. There was severe concentric hypertrophy. Systolic function was vigorous. The   estimated ejection fraction was in the range of 65% to 70%. - Aortic valve: No evidence of vegetation. - Mitral valve: No evidence of vegetation. - Left atrium: No evidence of thrombus in the appendage. - Right atrium: There was a large, 2.8 cm (L) x 1.7 cm (W), spherical, fixed mass; based on its appearance, myxoma cannot be   excluded. It is just below the insertion of the IVC (IVC is clear/patent). There is mobile thrombus assosciated distally with mass. - Tricuspid  valve: No evidence of vegetation. - Pulmonic valve: No evidence of vegetation. - Superior vena cava: The study excluded a thrombus.  CT Angio Chest PE W or WO Contrast FINDINGS: Cardiovascular: Pulmonary arterial opacification is adequate to the segmental level without evidence of emboli. A right jugular centralvenous catheter terminates near the tricuspid valve. There is suggestion of a filling defect in the posterior right atrium, however this is not not well evaluated due to motion artifact and contrast timing and the appearance may be secondary to inflow of non-opacified blood from the IVC. The heart is borderline enlarged. There is no pericardial effusion. The thoracic aorta is normal incaliber. Mediastinum/Nodes: No enlarged axillary, mediastinal, or hilar lymph nodes are identified. The thyroid and esophagus are grossly unremarkable. Lungs/Pleura: No pleural effusion or pneumothorax. Mild respiratory motion artifact without lung consolidation or mass. Upper Abdomen: Unremarkable. Musculoskeletal: No acute osseous abnormality or suspicious osseous lesion. IMPRESSION: 1. No evidence of pulmonary emboli. 2. Right atrial mass on echocardiogram not well evaluated on this nondedicated CTA.  Discharge Instructions: Discharge Instructions    Diet - low sodium heart healthy    Complete by:  As directed    Discharge instructions    Complete by:  As directed    Please give last dose of Warfarin before the patient is discharged home.   Ms. Hasley,   You were found to have a clot in the right atrium of your heart. To prevent complications occurring from this clot you had to be anticoagulated (thin your blood), to prevent future clot formation. You were put on IV heparin and then transitioned to oral Warfarin. Take Warfarin 7.5 mg daily (1.5 tablets) starting tomorrow 01/25/2017. It is important that this is closely monitored in Hemodialysis and by your primary care physician so that the  Warfarin dose can be adjusted accordingly.  Your diabetes medicine has been changed. Take 12 units of Lantus daily. Take 3 units of Novolog three times daily with meals.  Please follow up with you PCP Dr. Winifred Olive on Thursday October 18 @ 11:15 AM. This hospital follow up is very important for your diabetes, as well as Warfarin management.   Increase activity slowly    Complete by:  As directed       Signed: Melanee Spry, MD 01/24/2017, 1:35 PM   Pager: [phone]

## 2017-01-20 DIAGNOSIS — R791 Abnormal coagulation profile: Secondary | ICD-10-CM

## 2017-01-20 LAB — BASIC METABOLIC PANEL
ANION GAP: 11 (ref 5–15)
BUN: 21 mg/dL — ABNORMAL HIGH (ref 6–20)
CHLORIDE: 95 mmol/L — AB (ref 101–111)
CO2: 26 mmol/L (ref 22–32)
CREATININE: 5.75 mg/dL — AB (ref 0.44–1.00)
Calcium: 8.7 mg/dL — ABNORMAL LOW (ref 8.9–10.3)
GFR calc non Af Amer: 9 mL/min — ABNORMAL LOW (ref >60)
GFR, EST AFRICAN AMERICAN: 10 mL/min — AB (ref >60)
Glucose, Bld: 196 mg/dL — ABNORMAL HIGH (ref 65–99)
POTASSIUM: 4 mmol/L (ref 3.5–5.1)
Sodium: 132 mmol/L — ABNORMAL LOW (ref 135–145)

## 2017-01-20 LAB — HEPARIN LEVEL (UNFRACTIONATED): HEPARIN UNFRACTIONATED: 0.5 [IU]/mL (ref 0.30–0.70)

## 2017-01-20 LAB — PROTIME-INR
INR: 1.52
Prothrombin Time: 18.1 seconds — ABNORMAL HIGH (ref 11.4–15.2)

## 2017-01-20 LAB — GLUCOSE, CAPILLARY
GLUCOSE-CAPILLARY: 221 mg/dL — AB (ref 65–99)
GLUCOSE-CAPILLARY: 231 mg/dL — AB (ref 65–99)
GLUCOSE-CAPILLARY: 171 mg/dL — AB (ref 65–99)

## 2017-01-20 MED ORDER — PRO-STAT SUGAR FREE PO LIQD
30.0000 mL | Freq: Two times a day (BID) | ORAL | Status: DC
  Administered 2017-01-20 – 2017-01-23 (×4): 30 mL via ORAL
  Filled 2017-01-20 (×7): qty 30

## 2017-01-20 MED ORDER — WARFARIN SODIUM 10 MG PO TABS
10.0000 mg | ORAL_TABLET | Freq: Once | ORAL | Status: AC
  Administered 2017-01-20: 10 mg via ORAL
  Filled 2017-01-20: qty 1

## 2017-01-20 MED ORDER — DARBEPOETIN ALFA 60 MCG/0.3ML IJ SOSY
60.0000 ug | PREFILLED_SYRINGE | INTRAMUSCULAR | Status: DC
  Filled 2017-01-20: qty 0.3

## 2017-01-20 NOTE — Progress Notes (Addendum)
[facility] Progress Note   Subjective: Patient in bed, no new C/Os. Says she dropped SBP to 70s however this is not charted. Says she has not been getting to EDW in HD center D/T low BP and cramping-says she signs off early because she can't stand the cramping. Continues on heparin drip. LUE still swollen. Still nauseated, having to take reglan, phenergan and zofran to eat.   Objective Vitals:   01/19/17 1433 01/19/17 2258 01/20/17 0359 01/20/17 0900  BP: 132/64 137/72 (!) 157/89 121/67  Pulse: 89 91 87 86  Resp: 18 17 18 18   Temp: (!) 97 F (36.1 C) 98.4 F (36.9 C) 98.8 F (37.1 C) 98.6 F (37 C)  TempSrc: Oral Oral Oral Oral  SpO2: 98% 100% 100% 99%  Weight: 87.2 kg (192 lb 3.9 oz) 89 kg (196 lb 1.6 oz)    Height:       Physical Exam General: WNWD NAD Heart: S1,S2 No M/G/R Lungs: CTAB A/P Abdomen: Active BS Extremities: No LE edema Dialysis Access: LUA AVG 1+ non-pitting edema from hand to elbow. Edema from elbow to shoulder improved, + bruit. RIJ TDC drsg CDI.     Additional Objective Labs: Basic Metabolic Panel:  Recent Labs Lab 01/15/17 2052 01/17/17 1053 01/19/17 0434 01/20/17 0648  NA 128* 127* 125* 132*  K 4.6 4.1 4.0 4.0  CL 95* 91* 91* 95*  CO2 24 25 27 26   GLUCOSE 211* 234* 215* 196*  BUN 51* 45* 30* 21*  CREATININE 8.08* 8.72* 8.43* 5.75*  CALCIUM 8.5* 8.7* 8.7* 8.7*  PHOS 4.3 5.8*  --   --    Liver Function Tests:  Recent Labs Lab 01/15/17 2052 01/17/17 1053  ALBUMIN 3.1* 2.9*   No results for input(s): LIPASE, AMYLASE in the last 168 hours. CBC:  Recent Labs Lab 01/14/17 0237 01/15/17 0420  01/16/17 0536 01/17/17 0451 01/18/17 0812  WBC 5.7 6.1  --  5.1 8.1 6.2  HGB 9.4* 8.6*  < > 8.5* 8.7* 8.8*  HCT 28.9* 25.4*  < > 25.9* 26.8* 26.7*  MCV 90.9 90.1  --  90.2 90.8 92.1  PLT 244 237  --  216 218 233  < > = values in this interval not displayed. Blood Culture    Component Value Date/Time   SDES NASAL SWAB  01/09/2017 2049   [place] NONE 01/09/2017 2049   CULT NO MRSA DETECTED 01/09/2017 2049   REPTSTATUS 01/11/2017 FINAL 01/09/2017 2049    Cardiac Enzymes: No results for input(s): CKTOTAL, CKMB, CKMBINDEX, TROPONINI in the last 168 hours. CBG:  Recent Labs Lab 01/18/17 1745 01/18/17 2242 01/19/17 0815 01/19/17 1702 01/20/17 0752  GLUCAP 145* 174* 213* 258* 221*   Iron Studies: No results for input(s): IRON, TIBC, TRANSFERRIN, FERRITIN in the last 72 hours. @lablastinr3 @ Studies/Results: No results found. Medications: . sodium chloride    . sodium chloride    . sodium chloride 10 mL/hr at 01/15/17 0938  . heparin 1,050 Units/hr (01/19/17 1533)  . sodium chloride     . calcitRIOL  0.25 mcg Oral Q M,W,F-HD  . calcium carbonate  400 mg of elemental calcium Oral TID WC  . carbamazepine  200 mg Oral QHS  . darbepoetin (ARANESP) injection - DIALYSIS  40 mcg Intravenous Q Mon-HD  . feeding supplement (NEPRO CARB STEADY)  237 mL Oral TID AC  . gabapentin  300 mg Oral QHS  . insulin aspart  0-7 Units Subcutaneous TID WC  . insulin aspart  3 Units  Subcutaneous TID WC  . insulin glargine  10 Units Subcutaneous Daily  . labetalol  300 mg Oral BID  . metoCLOPramide  5 mg Oral TID AC  . polyethylene glycol  17 g Oral BID  . promethazine  12.5 mg Intravenous Once in dialysis  . ramelteon  8 mg Oral QHS  . senna-docusate  2 tablet Oral Daily  . sevelamer carbonate  800 mg Oral TID WC  . sodium chloride flush  3 mL Intravenous Q12H  . Warfarin - Pharmacist Dosing Inpatient   Does not apply q1800   Dialysis Orders: [facility] MWF 4 hrs 180 NRe 84 kgs 400/800 2.0 K/2.0 Ca  Heparin 2500 units IV TIW Calcitriol 0.25 mcg PO TIW   Assessment/Plan: 1. R Atrial Clot: TEE on 09/28 showed mass (2.8 cm X 1.7 cm) consistent with thrombus. On anticoagulation then repeat TEE in 3 months to check for resolution of clot prior to removal of TDC. PCP Dr. Clayborne Dana, [facility] will  manage coumadin.  Currently on heparin bridging to Coumadin per pharmacy.  2. ESRD -MWF. Next HD 01/22/17 3. Anemia - HGB 8.8. Increase Aranesp to 60 mcg IV weekly.  4. Secondary hyperparathyroidism -repeat labs with HD Monday. Cont binders, VDRA 5. HTN/volume - HD yesterday Pre wt 95.6 kg Net UF 3.2 liters Post wt 88.95 kg. Not getting to goal. Patient said she had not been getting to EDW in HD center D/T cramping and low BPs. No LE edema. Raise EDW to 85.5 kg. Pt's fluid intake is way too high.  Needs encouragement to limit fluid intake.  Still 5kg up after 3kg off yest.  6. Nutrition - Last Albumin 2.9 Issues with appetite d/t N & V. Renal Carb mod diet. Add prostat.  7. AV Access/L Arm Swelling: LUA AVG placed per Dr. Oneida Alar 01/15/17. Still edematous + bruit. Possible allergic reaction to Gortex. To F/U with [facility] as OP- Hopefully ready for use in 1 month.  8. DMT1: per primary. BS 200s 9. Gastroparesis/constipation: On Reglan per primary, prn laxatives for constipation.   Rita H. Brown NP-C 01/20/2017, 11:48 AM  [facility] [phone]  Pt seen, examined, agree w assess/plan as above with additions as indicated.  [name] MD [employer] pager [phone]    cell [phone] 01/20/2017, 1:07 PM

## 2017-01-20 NOTE — Progress Notes (Signed)
Patient ID: Carrie [name], female   DOB: Apr 22, 1980, 36 y.o.   MRN: [id number] Moderate swelling in her left arm. Incisions all without evidence of infection and mild erythema over the forearm loop. Elevation when possible. No evidence of infection. No reason for her to have central venous occlusion. Continue to monitor

## 2017-01-20 NOTE — Progress Notes (Signed)
Subjective:  Patient evaluated at bedside; no acute overnight events. She states that her only complaint is the continued arm swelling and discomfort which is slowly improving; she reports some continued relief with use of benadryl. She denies chest pain, shortness of breath and has not had any bleeding.  Objective:  Vital signs in last 24 hours: Vitals:   01/19/17 1433 01/19/17 2258 01/20/17 0359 01/20/17 0900  BP: 132/64 137/72 (!) 157/89 121/67  Pulse: 89 91 87 86  Resp: 18 17 18 18   Temp: (!) 97 F (36.1 C) 98.4 F (36.9 C) 98.8 F (37.1 C) 98.6 F (37 C)  TempSrc: Oral Oral Oral Oral  SpO2: 98% 100% 100% 99%  Weight: 192 lb 3.9 oz (87.2 kg) 196 lb 1.6 oz (89 kg)    Height:       General: Sitting at side of bed with breakfast HEENT: [place]/AT, EOMI, no scleral icterus Cardiac: RRR, No R/M/G appreciated Pulm: normal effort, CTAB Abd: soft, non tender, non distended, BS normal Ext: extremities well perfused, no peripheral edema; left AV graft insertion site clean and dry, +left forearm swelling now with swelling in upper arm but continues improving, no erythema, radial pulse intact, hand is warm, grip strength 5/5 in left hand Neuro: alert and oriented X3, cranial nerves II-XII grossly intact  Assessment/Plan:  Principal Problem:   Right atrial mass Active Problems:   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 CTA chest negative for PE, right atrial mass was not well evaluated because was non-dedicated CTA.  Currently on heparin gtt and warfarin with plan to anticoagulate with warfarin for 3 months. Follow up TEE in 3 months. Plan for [facility] removal at that time if clot resolved, if not refer to CT surgery for options regarding removal. PT 18.8, INR 1.58. Spoke directly to patient's PCP Dr. Tamala Julian and updated her about  the patient's hospital course. Dr. Tamala Julian agreed to manage her Coumadin as an outpatient with goal of INR between 2-3.  -Continue heparin gtt -Warfarin dosing per pharmacy -Will remain in the hospital while INR becomes therapeutic -PT/INR can be drawn during dialysis, but receive Warfarin management through the PCP confirmed    Constipation Patient feeling constipated/nauseas most likely due to opioids and underlying gastroparesis -Senokot 2 tablets daily -Miralax BID   Hyponatremia Improved with dialysis, likely related solely to her renal dysfunction.  - f/u AM renal panel  ESRD 2/2 to poorly controlled T1DM and HTN, on HD MWF. HD cath placed on 08/2016. S/P POD #4 AV graft insertion; LUE continues to be swollen, however is improving daily. There are no signs of DVT or arterial clot (anyway on heparin), and no signs of compartment syndrome.  - Percocet q4 PRN for pain management, Topical Voltaren gel, Benadryl q6 hours PRN - Removal of TDC in 3 months following anticoagulation if clot resolved  - Nephrology following  T1DM Uncontrolled, complicated by peripheral neuropathy and gastroparesis. Most recent hemoglobin A1C 8.5 (11/2016).  Patient seen by diabetes educator will appreciate recommendations upon discharge.  - SQ Lantus 10U qAM + Novolog 3U TID with meals   - SSI-S  - Continue gabapentin 300 MG QHS - Continue antiemetics: reglan, zofran, and phenergan PRN for nausea  HTN -Typically normotensive after dialysis -Continue home labetalol 300 mg BID  F: none E: will continue to monitor N: Renal + CMdiet   VTE ppx: Heparin gtt   Code status: Full code, confirmed on admission   Dispo: Anticipated discharge in approximately 2-3 days.  Alphonzo Grieve, MD 01/20/2017, 9:48 AM

## 2017-01-20 NOTE — Plan of Care (Signed)
Problem: Physical Regulation: Goal: Ability to maintain clinical measurements within normal limits will improve Outcome: Not Progressing Redness noted beneath skin on left arm that follows the AVG. Vascular surgery notified. Will continue to monitor. Bartholomew Crews, RN

## 2017-01-20 NOTE — Progress Notes (Signed)
ANTICOAGULATION CONSULT NOTE -     Pharmacy Consult for Heparin /Coumadin  Indication: R-atrial mass thought to be TDC thrombus  Allergies  Allergen Reactions  . No Known Allergies     Patient Measurements: Height: 5\' 8"  (172.7 cm) Weight: 196 lb 1.6 oz (89 kg) IBW/kg (Calculated) : 63.9  Vital Signs: Temp: 98.6 F (37 C) (10/06 0900) Temp Source: Oral (10/06 0900) BP: 121/67 (10/06 0900) Pulse Rate: 86 (10/06 0900)  Labs:  Recent Labs  01/18/17 0410 01/18/17 0812 01/19/17 0434 01/20/17 0648  HGB  --  8.8*  --   --   HCT  --  26.7*  --   --   PLT  --  233  --   --   LABPROT 17.5*  --  18.8* 18.1*  INR 1.45  --  1.58 1.52  HEPARINUNFRC 0.37  --  0.51 0.50  CREATININE  --   --  8.43* 5.75*    Estimated Creatinine Clearance: 16.1 mL/min (A) (by C-G formula based on SCr of 5.75 mg/dL (H)).  Assessment: 65 YOF with ESRD on HD who was found to have right atrial mass . TEE on 9/27 showed large right atrial mass thought to be a thrombus attached to tip of HD catheter. CT on 9/30 showed no evidence of PE. Pharmacy is on board for heparin bridge to a therapeutic INR with warfarin.   The patient's heparin level today remains therapeutic (HL 0.50<0.51 << 0.37, goal of 0.3-0.7) on current heparin drip rate of 1050 units/hr. INR today remains SUBtherapeutic at 1.52, slightly down from yesterday's  INR 1.58, goal of 2-3. CBC stable. No bleeding reported.   We increased the warfarin dose to 10mg  yesterday and I will order 10mg  today.   Goal of Therapy:  Heparin level 0.3-0.7 units/ml Monitor platelets by anticoagulation protocol: Yes   Plan:  1. Continue Heparin at 1050 units/hr (10.5 ml/hr) 2. Warfarin 10 mg x 1 dose at 1800 today 3. Will continue to monitor for any signs/symptoms of bleeding and will follow up with heparin level and PT/INR in the a.m.   Thank you for allowing pharmacy to be a part of this patient's care.  Nicole Cella, RPh Clinical Pharmacist Pager:  [phone] Clinical phone for 01/20/2017 from 8a-3:30p: [phone] If after 3:30p, please call main pharmacy at: [phone] 01/20/2017 1:28 PM

## 2017-01-21 LAB — RENAL FUNCTION PANEL
ALBUMIN: 3 g/dL — AB (ref 3.5–5.0)
ANION GAP: 11 (ref 5–15)
BUN: 30 mg/dL — ABNORMAL HIGH (ref 6–20)
CALCIUM: 9.4 mg/dL (ref 8.9–10.3)
CO2: 27 mmol/L (ref 22–32)
CREATININE: 7.19 mg/dL — AB (ref 0.44–1.00)
Chloride: 92 mmol/L — ABNORMAL LOW (ref 101–111)
GFR calc Af Amer: 8 mL/min — ABNORMAL LOW (ref >60)
GFR calc non Af Amer: 7 mL/min — ABNORMAL LOW (ref >60)
Glucose, Bld: 223 mg/dL — ABNORMAL HIGH (ref 65–99)
Phosphorus: 3.5 mg/dL (ref 2.5–4.6)
Potassium: 3.8 mmol/L (ref 3.5–5.1)
SODIUM: 130 mmol/L — AB (ref 135–145)

## 2017-01-21 LAB — PROTIME-INR
INR: 1.81
Prothrombin Time: 20.8 seconds — ABNORMAL HIGH (ref 11.4–15.2)

## 2017-01-21 LAB — GLUCOSE, CAPILLARY
GLUCOSE-CAPILLARY: 195 mg/dL — AB (ref 65–99)
Glucose-Capillary: 244 mg/dL — ABNORMAL HIGH (ref 65–99)
Glucose-Capillary: 239 mg/dL — ABNORMAL HIGH (ref 65–99)
Glucose-Capillary: 261 mg/dL — ABNORMAL HIGH (ref 65–99)

## 2017-01-21 LAB — CBC
HCT: 27.4 % — ABNORMAL LOW (ref 36.0–46.0)
HEMOGLOBIN: 8.8 g/dL — AB (ref 12.0–15.0)
MCH: 30 pg (ref 26.0–34.0)
MCHC: 32.1 g/dL (ref 30.0–36.0)
MCV: 93.5 fL (ref 78.0–100.0)
PLATELETS: 298 10*3/uL (ref 150–400)
RBC: 2.93 MIL/uL — AB (ref 3.87–5.11)
RDW: 16.1 % — ABNORMAL HIGH (ref 11.5–15.5)
WBC: 7.4 10*3/uL (ref 4.0–10.5)

## 2017-01-21 LAB — HEPARIN LEVEL (UNFRACTIONATED): Heparin Unfractionated: 0.37 IU/mL (ref 0.30–0.70)

## 2017-01-21 MED ORDER — OXYCODONE-ACETAMINOPHEN 5-325 MG PO TABS
1.0000 | ORAL_TABLET | ORAL | Status: DC | PRN
  Administered 2017-01-21: 1 via ORAL
  Administered 2017-01-22 – 2017-01-23 (×6): 2 via ORAL
  Filled 2017-01-21 (×5): qty 2
  Filled 2017-01-21: qty 1
  Filled 2017-01-21: qty 2

## 2017-01-21 MED ORDER — BISACODYL 10 MG RE SUPP
10.0000 mg | Freq: Once | RECTAL | Status: AC
  Administered 2017-01-21: 10 mg via RECTAL
  Filled 2017-01-21: qty 1

## 2017-01-21 MED ORDER — WARFARIN SODIUM 10 MG PO TABS
10.0000 mg | ORAL_TABLET | Freq: Once | ORAL | Status: AC
  Administered 2017-01-21: 10 mg via ORAL
  Filled 2017-01-21: qty 1

## 2017-01-21 NOTE — Progress Notes (Signed)
Medicine attending: Clinical status and database reviewed with resident physician Dr. Williemae Area and I concur with her evaluation and management plan which we discussed together.  No acute change in the patient's condition.  Slow but steady improvement in the pain and edema of her left arm.  She has now had 4 doses of warfarin.  INR starting to come up and was 1.8 today.  Continue parenteral heparin until therapeutic Coumadin times 48 hours.

## 2017-01-21 NOTE — Progress Notes (Signed)
Subjective: Patient seen and examined. No acute overnight events.  Her only complaint is the continued arm swelling and pain, but this is improving. She denies chest pain, shortness of breath and has not had any bleeding. Despite the bowel regimen, she has not had a bowel movement   Objective:  Vital signs in last 24 hours: Vitals:   01/20/17 0900 01/20/17 1814 01/20/17 2214 01/21/17 0514  BP: 121/67 (!) 157/83 (!) 152/92 (!) 146/76  Pulse: 86 96 95 86  Resp: 18 18 18 18   Temp: 98.6 F (37 C) 98.7 F (37.1 C) 99.4 F (37.4 C) 98.9 F (37.2 C)  TempSrc: Oral Oral Oral Oral  SpO2: 99% 100% 98% 99%  Weight:   199 lb 1.6 oz (90.3 kg)   Height:       General: Sleeping comfortably in bed, NAD HEENT: [place]/AT, EOMI, no scleral icterus Cardiac: RRR, No R/M/G appreciated Pulm: normal effort, CTAB Abd: soft, non tender, non distended, BS normal Ext: extremities well perfused, no peripheral edema; left AV graft insertion site clean and dry, +left forearm swelling now with swelling in upper arm but continues improving, no erythema, radial pulse intact, hand is warm, grip strength 5/5 in left hand Neuro: alert and oriented X3, cranial nerves II-XII grossly intact  Assessment/Plan:  Principal Problem:   Right atrial mass Active Problems:   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 CTA chest negative for PE, right atrial mass was not well evaluated because was non-dedicated CTA.  Currently on heparin gtt and warfarin with plan to anticoagulate with warfarin for 3 months. Follow up TEE in 3 months. Plan for [facility] removal at that time if clot resolved, if not refer to CT surgery for options regarding removal. PT 20.8, INR 1.81. Spoke directly to patient's PCP Dr. Tamala Julian and updated her about the patient's hospital course. Dr. Tamala Julian  agreed to manage her Coumadin as an outpatient with goal of INR between 2-3.  -Continue heparin gtt -Warfarin dosing per pharmacy -PT/INR can be drawn during dialysis, will receive Warfarin management through PCP    Constipation Yet to have bowel movement. Most likely due to opioids and underlying gastroparesis. -Senokot 2 tablets daily -Miralax BID  -dulcolax suppository today  Hyponatremia Na+ level 130 today. Improved with dialysis, likely related solely to her renal dysfunction.  - BMET in AM  ESRD 2/2 to poorly controlled T1DM and HTN, on HD MWF. HD cath placed on 08/2016. S/P POD #4 AV graft insertion; LUE continues to be swollen, however is improving daily. There are no signs of DVT or arterial clot (anyway on heparin), and no signs of compartment syndrome.  - Percocet q4 PRN for pain management, Topical Voltaren gel, Benadryl q6 hours PRN - Removal of TDC in 3 months following anticoagulation if clot resolved  - Nephrology following  T1DM Uncontrolled, complicated by peripheral neuropathy and gastroparesis. Most recent hemoglobin A1C 8.5 (11/2016).  Patient seen by diabetes educator will appreciate recommendations upon discharge.  - SQ Lantus 10U qAM + Novolog 3U TID with meals   - SSI-S  - Continue gabapentin 300 MG QHS - Continue antiemetics: reglan, zofran, and phenergan PRN for nausea  HTN -Typically normotensive after dialysis -Continue home labetalol 300 mg BID   F: none E: will continue to monitor N: Renal +  CMdiet   VTE ppx: Heparin gtt   Code status: Full code, confirmed on admission   Dispo: Anticipated discharge in approximately 1-2 days.  Arvil Chaco, MD Internal Medicine PGY1 Pager # [phone]

## 2017-01-21 NOTE — Progress Notes (Signed)
[facility] Progress Note   Subjective: L arm still swollen and sore.  No new c/o.    Objective Vitals:   01/20/17 1814 01/20/17 2214 01/21/17 0514 01/21/17 1000  BP: (!) 157/83 (!) 152/92 (!) 146/76 (!) 148/89  Pulse: 96 95 86 90  Resp: 18 18 18 16   Temp: 98.7 F (37.1 C) 99.4 F (37.4 C) 98.9 F (37.2 C) 98.7 F (37.1 C)  TempSrc: Oral Oral Oral Oral  SpO2: 100% 98% 99% 100%  Weight:  90.3 kg (199 lb 1.6 oz)    Height:       Physical Exam General: WNWD NAD Heart: S1,S2 No M/G/R Lungs: CTAB A/P Abdomen: Active BS Extremities: No LE edema Dialysis Access: LUA AVG 1+ non-pitting edema from hand to elbow. Edema from elbow to shoulder improved, + bruit. RIJ TDC drsg CDI.    Dialysis: [facility] MWF 4h  84kg   2/2  Hep 2500  R IJ/ L arm AVG - Calcitriol 0.25 mcg PO TIW   Assessment/Plan: 1. R Atrial Clot: TEE on 09/28 showed mass (2.8 cm X 1.7 cm) consistent with thrombus. On anticoagulation then repeat TEE in 3 months (through 04/13/17) to check for resolution of clot prior to removal of TDC. If clot not resolved, will be referred for consideration of surgical approach. PCP Dr. Clayborne Dana, [facility] will manage coumadin.  [facility] will draw INR's and send to Dr Tamala Julian. Do not remove TDC in 4 wks if AVG used successfully. Keep in til f/u TEE done. Currently on heparin bridging to Coumadin per pharmacy.  2. ESRD -MWF. HD tomorrow.  3. Anemia - HGB 8.8. Increase Aranesp to 60 mcg IV weekly.  4. Secondary hyperparathyroidism -repeat labs with HD Monday. Cont binders, VDRA 5. HTN/volume - 6kg up today, talked about importance of limited fluid intake. On labetalol po.  6. Nutrition - Last Albumin 2.9 Issues with appetite d/t N & V. Renal Carb mod diet. Add prostat.  7. SP new L FA AVG 10/1 (due to poorly maturing AVF) - probable allergic reaction to Gortex. VVS following.' 8. DMT1: per primary. BS 200s 9. Gastroparesis/constipation: On Reglan per primary, prn  laxatives for constipation.   [name] MD [employer] pgr [phone]   01/21/2017, 11:47 AM    Additional Objective Labs: Basic Metabolic Panel:  Recent Labs Lab 01/15/17 2052 01/17/17 1053 01/19/17 0434 01/20/17 0648 01/21/17 0301  NA 128* 127* 125* 132* 130*  K 4.6 4.1 4.0 4.0 3.8  CL 95* 91* 91* 95* 92*  CO2 24 25 27 26 27   GLUCOSE 211* 234* 215* 196* 223*  BUN 51* 45* 30* 21* 30*  CREATININE 8.08* 8.72* 8.43* 5.75* 7.19*  CALCIUM 8.5* 8.7* 8.7* 8.7* 9.4  PHOS 4.3 5.8*  --   --  3.5   Liver Function Tests:  Recent Labs Lab 01/15/17 2052 01/17/17 1053 01/21/17 0301  ALBUMIN 3.1* 2.9* 3.0*   No results for input(s): LIPASE, AMYLASE in the last 168 hours. CBC:  Recent Labs Lab 01/15/17 0420  01/16/17 0536 01/17/17 0451 01/18/17 0812 01/21/17 0301  WBC 6.1  --  5.1 8.1 6.2 7.4  HGB 8.6*  < > 8.5* 8.7* 8.8* 8.8*  HCT 25.4*  < > 25.9* 26.8* 26.7* 27.4*  MCV 90.1  --  90.2 90.8 92.1 93.5  PLT 237  --  216 218 233 298  < > = values in this interval not displayed. Blood Culture    Component Value Date/Time  SDES NASAL SWAB 01/09/2017 2049   [place] NONE 01/09/2017 2049   CULT NO MRSA DETECTED 01/09/2017 2049   REPTSTATUS 01/11/2017 FINAL 01/09/2017 2049    Cardiac Enzymes: No results for input(s): CKTOTAL, CKMB, CKMBINDEX, TROPONINI in the last 168 hours. CBG:  Recent Labs Lab 01/19/17 1702 01/20/17 0752 01/20/17 1154 01/20/17 1613 01/21/17 0718  GLUCAP 258* 221* 231* 171* 195*   Iron Studies: No results for input(s): IRON, TIBC, TRANSFERRIN, FERRITIN in the last 72 hours. @lablastinr3 @ Studies/Results: No results found. Medications: . sodium chloride    . sodium chloride    . sodium chloride 10 mL/hr at 01/15/17 0938  . heparin 1,050 Units/hr (01/19/17 1533)  . sodium chloride     . calcitRIOL  0.25 mcg Oral Q M,W,F-HD  . calcium carbonate  400 mg of elemental calcium Oral TID WC  . carbamazepine  200 mg  Oral QHS  . [START ON 01/22/2017] darbepoetin (ARANESP) injection - DIALYSIS  60 mcg Intravenous Q Mon-HD  . feeding supplement (NEPRO CARB STEADY)  237 mL Oral TID AC  . feeding supplement (PRO-STAT SUGAR FREE 64)  30 mL Oral BID  . gabapentin  300 mg Oral QHS  . insulin aspart  0-7 Units Subcutaneous TID WC  . insulin aspart  3 Units Subcutaneous TID WC  . insulin glargine  10 Units Subcutaneous Daily  . labetalol  300 mg Oral BID  . metoCLOPramide  5 mg Oral TID AC  . polyethylene glycol  17 g Oral BID  . promethazine  12.5 mg Intravenous Once in dialysis  . ramelteon  8 mg Oral QHS  . senna-docusate  2 tablet Oral Daily  . sevelamer carbonate  800 mg Oral TID WC  . sodium chloride flush  3 mL Intravenous Q12H  . Warfarin - Pharmacist Dosing Inpatient   Does not apply [phone]

## 2017-01-21 NOTE — Progress Notes (Signed)
Paged that patient was experiencing increased pain of LUE at surgical site. Went to bedside to evaluate the patient. States that the Oxy helped prior but for the last 2 doses it does not seem to help the pain. Feels like a stabbing/burning pain at the surgical site.   BP: 160/95 HR: 90 RR: 17 General: Resting comfortably in no acute distress  LUE: Nonpitting edema, radial pulse palpable, skin is not tight, grip strength and sensation intact, palpable thrill and auditory bruit  Plan: - No signs of compartment syndrome or thrombosis of the graft. Will increase oxy 5 mg from 1 tablet to 1-2 tablets

## 2017-01-21 NOTE — Progress Notes (Signed)
ANTICOAGULATION CONSULT NOTE -     Pharmacy Consult for Heparin /Coumadin  Indication: R-atrial mass thought to be TDC thrombus  Allergies  Allergen Reactions  . No Known Allergies     Patient Measurements: Height: 5\' 8"  (172.7 cm) Weight: 199 lb 1.6 oz (90.3 kg) IBW/kg (Calculated) : 63.9  Vital Signs: Temp: 98.7 F (37.1 C) (10/07 1000) Temp Source: Oral (10/07 1000) BP: 148/89 (10/07 1000) Pulse Rate: 90 (10/07 1000)  Labs:  Recent Labs  01/19/17 0434 01/20/17 0648 01/21/17 0301  HGB  --   --  8.8*  HCT  --   --  27.4*  PLT  --   --  298  LABPROT 18.8* 18.1* 20.8*  INR 1.58 1.52 1.81  HEPARINUNFRC 0.51 0.50 0.37  CREATININE 8.43* 5.75* 7.19*    Estimated Creatinine Clearance: 13 mL/min (A) (by C-G formula based on SCr of 7.19 mg/dL (H)).  Assessment: 43 YOF with ESRD on HD who was found to have right atrial mass . TEE on 9/27 showed large right atrial mass thought to be a thrombus attached to tip of HD catheter. CT on 9/30 showed no evidence of PE. Pharmacy is on board for heparin bridge to a therapeutic INR with warfarin.   Heparin level today = 0.37, remains therapeutic on current heparin drip rate of 1050 units/hr. INR today = 1.81, trending upward toward goal 2-3. Continues on PTA carbamazepine (Tegrotol XR) which can decrease effect of warfarin. Will continue coumadin dose adjusting based on INR.  CBC stable with Hgb 8.8 low/stable and pltc wnl. No bleeding reported.   Goal of Therapy:  Heparin level 0.3-0.7 units/ml Monitor platelets by anticoagulation protocol: Yes   Plan:  1. Continue Heparin at 1050 units/hr (10.5 ml/hr) 2. Warfarin 10 mg x 1 dose at 1800 today 3. Will continue to monitor for any signs/symptoms of bleeding and will follow up with heparin level and PT/INR in the a.m.   Thank you for allowing pharmacy to be a part of this patient's care.  Nicole Cella, RPh Clinical Pharmacist Pager: [phone] Clinical phone for 01/21/2017 from  8a-3:30p: [phone] If after 3:30p, please call main pharmacy at: [phone] 01/21/2017 11:34 AM

## 2017-01-22 LAB — BASIC METABOLIC PANEL
ANION GAP: 10 (ref 5–15)
BUN: 36 mg/dL — ABNORMAL HIGH (ref 6–20)
CHLORIDE: 92 mmol/L — AB (ref 101–111)
CO2: 27 mmol/L (ref 22–32)
Calcium: 9 mg/dL (ref 8.9–10.3)
Creatinine, Ser: 8.77 mg/dL — ABNORMAL HIGH (ref 0.44–1.00)
GFR calc Af Amer: 6 mL/min — ABNORMAL LOW (ref >60)
GFR calc non Af Amer: 5 mL/min — ABNORMAL LOW (ref >60)
Glucose, Bld: 207 mg/dL — ABNORMAL HIGH (ref 65–99)
Potassium: 4.3 mmol/L (ref 3.5–5.1)
SODIUM: 129 mmol/L — AB (ref 135–145)

## 2017-01-22 LAB — CBC
HCT: 25.2 % — ABNORMAL LOW (ref 36.0–46.0)
HEMOGLOBIN: 8 g/dL — AB (ref 12.0–15.0)
MCH: 29.6 pg (ref 26.0–34.0)
MCHC: 31.7 g/dL (ref 30.0–36.0)
MCV: 93.3 fL (ref 78.0–100.0)
PLATELETS: 301 10*3/uL (ref 150–400)
RBC: 2.7 MIL/uL — ABNORMAL LOW (ref 3.87–5.11)
RDW: 16.3 % — ABNORMAL HIGH (ref 11.5–15.5)
WBC: 8 10*3/uL (ref 4.0–10.5)

## 2017-01-22 LAB — PROTIME-INR
INR: 2.09
PROTHROMBIN TIME: 23.3 s — AB (ref 11.4–15.2)

## 2017-01-22 LAB — GLUCOSE, CAPILLARY
GLUCOSE-CAPILLARY: 278 mg/dL — AB (ref 65–99)
Glucose-Capillary: 193 mg/dL — ABNORMAL HIGH (ref 65–99)

## 2017-01-22 LAB — HEPARIN LEVEL (UNFRACTIONATED): Heparin Unfractionated: 0.38 IU/mL (ref 0.30–0.70)

## 2017-01-22 MED ORDER — BISACODYL 10 MG RE SUPP
10.0000 mg | Freq: Every day | RECTAL | Status: DC
  Administered 2017-01-22 – 2017-01-24 (×3): 10 mg via RECTAL
  Filled 2017-01-22 (×3): qty 1

## 2017-01-22 MED ORDER — WARFARIN SODIUM 10 MG PO TABS
10.0000 mg | ORAL_TABLET | Freq: Once | ORAL | Status: AC
  Administered 2017-01-22: 10 mg via ORAL
  Filled 2017-01-22: qty 1

## 2017-01-22 MED ORDER — CALCITRIOL 0.25 MCG PO CAPS
ORAL_CAPSULE | ORAL | Status: AC
  Administered 2017-01-22: 0.25 ug via ORAL
  Filled 2017-01-22: qty 1

## 2017-01-22 NOTE — Progress Notes (Signed)
ANTICOAGULATION CONSULT NOTE - Follow Up Consult  Pharmacy Consult for Heparin and Coumadin Indication: R-atrial mass thought to be TDC thrombus  Allergies  Allergen Reactions  . No Known Allergies     Patient Measurements: Height: 5\' 8"  (172.7 cm) Weight: 196 lb 13.9 oz (89.3 kg) (standing weight) IBW/kg (Calculated) : 63.9 Heparin Dosing Weight: 89 kg  Vital Signs: Temp: 98.9 F (37.2 C) (10/08 1436) Temp Source: Oral (10/08 1436) BP: 166/94 (10/08 1436) Pulse Rate: 85 (10/08 1436)  Labs:  Recent Labs  01/20/17 0648 01/21/17 0301 01/22/17 0340  HGB  --  8.8* 8.0*  HCT  --  27.4* 25.2*  PLT  --  298 301  LABPROT 18.1* 20.8* 23.3*  INR 1.52 1.81 2.09  HEPARINUNFRC 0.50 0.37 0.38  CREATININE 5.75* 7.19* 8.77*   ESRD  Assessment:  76 YOF with ESRD on HD who was found to have right atrial mass . TEE on 9/27 showed large right atrial mass thought to be a thrombus attached to tip of HD catheter. CT on 9/30 showed no evidence of PE. Pharmacy is assisting with heparin bridge to a therapeutic INR with warfarin.     Heparin level remains therapeutic (0.38) on 1050 units/hr.   INR up to 2.09 today. First day therapeutic.  Heparin to continue til INR >2 for 2 days.   Has had Coumadin 7.5 mg x 3 days then 10 mg x 3 days.  Continues on Tegretol XR which can decrease Coumadin effect/increase requirement.  Goal of Therapy:  INR 2-3 Heparin level 0.3-0.7 units/ml Monitor platelets by anticoagulation protocol: Yes   Plan:   Continue heparin drip at 1050 units/hr.  Coumadin 10 mg x 1 more day.  Daily heparin level, PT/INR and CBC.  Expecting discharge on 10/9 if INR remains at goal.  May need 5 - 7.5 mg daily as starting discharge dose of Coumadin.  Suggest prescribing 5 mg tablets, for more flexibility if dose need to change over time.  Arty Baumgartner, [place] Pager: [phone]; or internal phone [phone] 01/22/2017,3:55 PM

## 2017-01-22 NOTE — Progress Notes (Signed)
Inpatient Diabetes Program Recommendations  AACE/ADA: New Consensus Statement on Inpatient Glycemic Control (2015)  Target Ranges:  Prepandial:   less than 140 mg/dL      Peak postprandial:   less than 180 mg/dL (1-2 hours)      Critically ill patients:  140 - 180 mg/dL   Results for Carrie [name], Carrie [name] (MRN [id number]) as of 01/22/2017 08:43  Ref. Range 01/21/2017 07:18 01/21/2017 12:54 01/21/2017 16:34 01/21/2017 20:34 01/22/2017 07:40  Glucose-Capillary Latest Ref Range: 65 - 99 mg/dL 195 (H) 244 (H) 239 (H) 261 (H) 193 (H)   Review of Glycemic Control  Current orders for Inpatient glycemic control: Lantus 10 units daily, Novolog 3 units TID with meals, Novolog 0-9 units TID with meals  Inpatient Diabetes Program Recommendations: Insulin - Basal: Please consider increasing Lantus to 12 units daily. Insulin - Meal Coverage: Please consider increasing meal coverage to Novolog 5 units TID with meals if patient eats at least 50% of meals. Outpatient DM medications: Per Diabetes Coordinator note on 01/18/17, patient would like to go back on Lantus and Novolog for DM control as an outpatient. Therefore, please discontinue NPH at discharge and prescribe Lantus and Novolog (similar regimen to being used as an inpatient if DM is fairly well controlled on that regimen).  Thanks, Barnie Alderman, RN, MSN, CDE Diabetes Coordinator Inpatient Diabetes Program [phone] (Team Pager from 8am to 5pm)

## 2017-01-22 NOTE — Progress Notes (Signed)
Subjective: Patient seen and examined. Had episode of increased pain and throbbing in her left forearm. She was evaluated by the night team and provided with additional pain medications.  She states her hand is much more swollen today, with numbness of her middle three fingers that is unchanged. She is having trouble gripping because of the hand swelling. She had an episode of palpitations last night while at rest. She denies chest pain, shortness of breath and has not had any bleeding. Was unable to use suppository because she fell asleep and the suppository was gone when she woke up. No BMs yet.    Objective:  Vital signs in last 24 hours: Vitals:   01/22/17 0929 01/22/17 1023 01/22/17 1027 01/22/17 1032  BP: 121/74 132/66 135/80 (!) 148/80  Pulse: 88 91 87 85  Resp: 18     Temp: 99.2 F (37.3 C) 98.8 F (37.1 C)    TempSrc: Oral Oral    SpO2: 100% 100%    Weight:  205 lb 4 oz (93.1 kg)    Height:       General: Sitting comfortably on the side of bed, NAD HEENT: [place]/AT, EOMI, no scleral icterus Cardiac: RRR, No R/M/G appreciated Pulm: normal effort, CTAB Abd: soft, non tender, non distended, BS normal Ext: extremities well perfused, no peripheral edema; left AV graft insertion site clean and dry, +left forearm swelling with significant left hand swelling greater than yesterday, no erythema, radial pulse intact, hand is warm, grip strength 4/5 in left hand, sensation grossly intact Neuro: alert and oriented X3, cranial nerves II-XII grossly intact  Assessment/Plan:  Principal Problem:   Right atrial mass Active Problems:   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 CTA chest negative for PE.  Currently on heparin gtt and warfarin with plan to anticoagulate with warfarin for 3 months. Follow up TEE in 3 months. Plan  for [facility] removal at that time if clot resolved, if not refer to CT surgery for options regarding removal.  PT 20.8, INR 2.08.  -Needs to be therapeutic on Warfarin x 2 days -Continue heparin gtt until therapeutic x 2 days -Warfarin dosing per pharmacy -PT/INR can be drawn during dialysis, will receive Warfarin management through PCP    Constipation Yet to have bowel movement. Most likely due to opioids and underlying gastroparesis. -Senokot 2 tablets daily -Miralax BID  -dulcolax suppository today  Hyponatremia Na+ level 129 today. Improves with dialysis, likely related solely to her renal dysfunction.  - Dialysis today   ESRD 2/2 to poorly controlled T1DM and HTN, on HD MWF. HD cath placed on 08/2016. LUE continues to be swollen, however is improving daily. There are no signs of DVT or arterial clot, and no signs of compartment syndrome.  - Percocet q4 PRN for pain management, Topical Voltaren gel, Benadryl q6 hours PRN - Removal of TDC in 3 months following anticoagulation if clot resolved  - Nephrology following  T1DM Uncontrolled, complicated by peripheral neuropathy and gastroparesis. Most recent hemoglobin A1C 8.5 (11/2016).  Patient seen by diabetes educator will appreciate recommendations upon discharge.  - SQ Lantus 10U qAM + Novolog 3U TID with meals   - SSI-S  - Continue gabapentin 300 MG QHS - Continue antiemetics: reglan, zofran, and phenergan PRN for nausea  HTN -Typically normotensive after  dialysis -Continue home labetalol 300 mg BID   F: none E: will continue to monitor N: Renal + CMdiet   VTE ppx: Heparin gtt   Code status: Full code, confirmed on admission   Dispo: Anticipated discharge in 1 day.   Arvil Chaco, MD Internal Medicine PGY1 Pager # [phone]

## 2017-01-22 NOTE — Progress Notes (Signed)
[facility] Progress Note   Subjective:   Patient is sitting on bedside, finishing breakfast. She c/os of palpitations lasting 30-45sec last night while laying in bed. Has occured previously but only after HD.  Denies SOB, CP, and N/V/D. No other new complaints.   Objective Vitals:   01/21/17 1631 01/21/17 2113 01/22/17 0602 01/22/17 0929  BP: (!) 163/98 (!) 160/95 (!) 149/80 121/74  Pulse: 88 90 89 88  Resp: 16 17 18 18   Temp: 98.4 F (36.9 C) 98.9 F (37.2 C) 98.8 F (37.1 C) 99.2 F (37.3 C)  TempSrc: Oral Oral Oral Oral  SpO2: 100% 100% 96% 100%  Weight:  90.3 kg (199 lb 1.6 oz)    Height:       Physical Exam General: NAD, WDWN Heart:RRR, no murmur, rub or gallop Lungs:CTA b/l Abdomen:soft, NT Extremities:LE: no edema, LUE: nonpitting edema from hand to elbow, +RP Dialysis Access: LU AVG - +bruit, R IJ [facility]  Filed Weights   01/19/17 2258 01/20/17 2214 01/21/17 2113  Weight: 89 kg (196 lb 1.6 oz) 90.3 kg (199 lb 1.6 oz) 90.3 kg (199 lb 1.6 oz)    Intake/Output Summary (Last 24 hours) at 01/22/17 0940 Last data filed at 01/22/17 0600  Gross per 24 hour  Intake            470.5 ml  Output                0 ml  Net            470.5 ml    Additional Objective Labs: Basic Metabolic Panel:  Recent Labs Lab 01/15/17 2052 01/17/17 1053  01/20/17 0648 01/21/17 0301 01/22/17 0340  NA 128* 127*  < > 132* 130* 129*  K 4.6 4.1  < > 4.0 3.8 4.3  CL 95* 91*  < > 95* 92* 92*  CO2 24 25  < > 26 27 27   GLUCOSE 211* 234*  < > 196* 223* 207*  BUN 51* 45*  < > 21* 30* 36*  CREATININE 8.08* 8.72*  < > 5.75* 7.19* 8.77*  CALCIUM 8.5* 8.7*  < > 8.7* 9.4 9.0  PHOS 4.3 5.8*  --   --  3.5  --   < > = values in this interval not displayed. Liver Function Tests:  Recent Labs Lab 01/15/17 2052 01/17/17 1053 01/21/17 0301  ALBUMIN 3.1* 2.9* 3.0*   No results for input(s): LIPASE, AMYLASE in the last 168 hours. CBC:  Recent Labs Lab 01/16/17 0536  01/17/17 0451 01/18/17 0812 01/21/17 0301 01/22/17 0340  WBC 5.1 8.1 6.2 7.4 8.0  HGB 8.5* 8.7* 8.8* 8.8* 8.0*  HCT 25.9* 26.8* 26.7* 27.4* 25.2*  MCV 90.2 90.8 92.1 93.5 93.3  PLT 216 218 233 298 301   Blood Culture    Component Value Date/Time   SDES NASAL SWAB 01/09/2017 2049   [place] NONE 01/09/2017 2049   CULT NO MRSA DETECTED 01/09/2017 2049   REPTSTATUS 01/11/2017 FINAL 01/09/2017 2049    CBG:  Recent Labs Lab 01/21/17 0718 01/21/17 1254 01/21/17 1634 01/21/17 2034 01/22/17 0740  GLUCAP 195* 244* 239* 261* 193*   Lab Results  Component Value Date   INR 2.09 01/22/2017   INR 1.81 01/21/2017   INR 1.52 01/20/2017   Studies/Results: No results found.  Medications: . sodium chloride 10 mL/hr at 01/15/17 0938  . heparin 1,050 Units/hr (01/19/17 1533)  . sodium chloride     . bisacodyl  10 mg Rectal  Daily  . calcitRIOL  0.25 mcg Oral Q M,W,F-HD  . calcium carbonate  400 mg of elemental calcium Oral TID WC  . carbamazepine  200 mg Oral QHS  . darbepoetin (ARANESP) injection - DIALYSIS  60 mcg Intravenous Q Mon-HD  . feeding supplement (NEPRO CARB STEADY)  237 mL Oral TID AC  . feeding supplement (PRO-STAT SUGAR FREE 64)  30 mL Oral BID  . gabapentin  300 mg Oral QHS  . insulin aspart  0-7 Units Subcutaneous TID WC  . insulin aspart  3 Units Subcutaneous TID WC  . insulin glargine  10 Units Subcutaneous Daily  . labetalol  300 mg Oral BID  . metoCLOPramide  5 mg Oral TID AC  . polyethylene glycol  17 g Oral BID  . promethazine  12.5 mg Intravenous Once in dialysis  . ramelteon  8 mg Oral QHS  . senna-docusate  2 tablet Oral Daily  . sevelamer carbonate  800 mg Oral TID WC  . sodium chloride flush  3 mL Intravenous Q12H  . Warfarin - Pharmacist Dosing Inpatient   Does not apply q1800   Dialysis:  [facility] MWF 4h  84kg   2/2  Hep 2500  R IJ/ L arm AVG - Calcitriol 0.25 mcg PO TIW  Assessment/Plan: 1. R Atrial Clot: TEE on 01/12/17 ID mass  (2.8cmx1.7cm) consistent with thrombus. On anticoagulation, repeat TEE in 3 months (through 04/13/17) to check for resolution of clot PRIOR TO REMOVAL OF TDC. If unresolved, will be referred to possible surgical removal.  PCP Dr. Clayborne [place], [place] To manage coumadin. INR's to be drawn at HD and sent to Dr. Tamala Julian. Do not remove TDC in 4 weeks if AVG used successfully. Keep in til f/u TEE completed and further management determined. Currently on heparin bridging to coumadin per pharmacy. 2. ESRD - MWF, HD today, orders written.  3. Anemia - Hgb drop 8.6>8.0. Aranesp increased to 60mcg IV week, to receive  today. Follow. 4. Secondary Hyperparathyroidism - Ca and P within goal. Cont calcitriol and binders. 5. Vol/HTN - BP elevated. Cont labetalol. Titrate down volume with HD. Not to EDW since admission, will need to re-evaluate for discharge.  6. Nutrition - last albumin 3.0. Nepro TIC, renavite and renal diet.  7. s/p new L AVG 10/1 - (due to poorly maturing AVF) - possible allergic reaction to Gortex. VVS following. 8. DMT1 - per primary.  9. Gastroparesis/constipation - On reglan per primary, prn laxatives for constipation.    [name], PA-C [facility] 01/22/2017,9:40 AM  LOS: 10 days

## 2017-01-23 LAB — HEPARIN LEVEL (UNFRACTIONATED): Heparin Unfractionated: 0.47 IU/mL (ref 0.30–0.70)

## 2017-01-23 LAB — GLUCOSE, CAPILLARY
GLUCOSE-CAPILLARY: 213 mg/dL — AB (ref 65–99)
GLUCOSE-CAPILLARY: 194 mg/dL — AB (ref 65–99)
GLUCOSE-CAPILLARY: 154 mg/dL — AB (ref 65–99)

## 2017-01-23 LAB — PROTIME-INR
INR: 2.03
Prothrombin Time: 22.8 seconds — ABNORMAL HIGH (ref 11.4–15.2)

## 2017-01-23 MED ORDER — WARFARIN SODIUM 10 MG PO TABS
10.0000 mg | ORAL_TABLET | Freq: Once | ORAL | Status: AC
  Administered 2017-01-23: 10 mg via ORAL
  Filled 2017-01-23: qty 1

## 2017-01-23 MED ORDER — INSULIN GLARGINE 100 UNIT/ML ~~LOC~~ SOLN
12.0000 [IU] | Freq: Every day | SUBCUTANEOUS | Status: DC
  Administered 2017-01-24 (×2): 12 [IU] via SUBCUTANEOUS
  Filled 2017-01-23: qty 0.12

## 2017-01-23 NOTE — Progress Notes (Signed)
[facility] Progress Note   Subjective:   Patient is feeling well, arm pain decreased, ready to go home. No new complaints.   Objective Vitals:   01/22/17 1436 01/22/17 1654 01/22/17 2037 01/23/17 0556  BP: (!) 166/94 (!) 145/86 (!) 147/91 135/78  Pulse: 85 95 87 84  Resp: 18 18 18 18   Temp: 98.9 F (37.2 C) 99.3 F (37.4 C) 99.7 F (37.6 C) 98.6 F (37 C)  TempSrc: Oral Oral Oral Oral  SpO2: 100% 100% 100% 98%  Weight: 89.3 kg (196 lb 13.9 oz)  89.6 kg (197 lb 9.6 oz)   Height:       Physical Exam General:NAD, WDWN Heart:RRR, no murmur, rub or gallop Lungs:CTA b/l, no increased effort Abdomen:soft, NT Extremities:LE: no edema b/l, LUE: +non pitting edema from hand to elbow, +RP, warm digits Dialysis Access: [facility], RU AVG maturing, +bruit   Filed Weights   01/22/17 1023 01/22/17 1436 01/22/17 2037  Weight: 93.1 kg (205 lb 4 oz) 89.3 kg (196 lb 13.9 oz) 89.6 kg (197 lb 9.6 oz)    Intake/Output Summary (Last 24 hours) at 01/23/17 0913 Last data filed at 01/23/17 0438  Gross per 24 hour  Intake              310 ml  Output             4000 ml  Net            -3690 ml    Additional Objective Labs: Basic Metabolic Panel:  Recent Labs Lab 01/17/17 1053  01/20/17 0648 01/21/17 0301 01/22/17 0340  NA 127*  < > 132* 130* 129*  K 4.1  < > 4.0 3.8 4.3  CL 91*  < > 95* 92* 92*  CO2 25  < > 26 27 27   GLUCOSE 234*  < > 196* 223* 207*  BUN 45*  < > 21* 30* 36*  CREATININE 8.72*  < > 5.75* 7.19* 8.77*  CALCIUM 8.7*  < > 8.7* 9.4 9.0  PHOS 5.8*  --   --  3.5  --   < > = values in this interval not displayed. Liver Function Tests:  Recent Labs Lab 01/17/17 1053 01/21/17 0301  ALBUMIN 2.9* 3.0*  CBC:  Recent Labs Lab 01/17/17 0451 01/18/17 0812 01/21/17 0301 01/22/17 0340  WBC 8.1 6.2 7.4 8.0  HGB 8.7* 8.8* 8.8* 8.0*  HCT 26.8* 26.7* 27.4* 25.2*  MCV 90.8 92.1 93.5 93.3  PLT 218 233 298 301   Blood Culture    Component Value Date/Time   SDES NASAL SWAB 01/09/2017 2049   [place] NONE 01/09/2017 2049   CULT NO MRSA DETECTED 01/09/2017 2049   REPTSTATUS 01/11/2017 FINAL 01/09/2017 2049    CBG:  Recent Labs Lab 01/21/17 1634 01/21/17 2034 01/22/17 0740 01/22/17 1651 01/23/17 0813  GLUCAP 239* 261* 193* 278* 213*   Lab Results  Component Value Date   INR 2.03 01/23/2017   INR 2.09 01/22/2017   INR 1.81 01/21/2017    Medications: . sodium chloride 10 mL/hr at 01/15/17 0938  . heparin 1,050 Units/hr (01/19/17 1533)  . sodium chloride     . bisacodyl  10 mg Rectal Daily  . calcitRIOL  0.25 mcg Oral Q M,W,F-HD  . calcium carbonate  400 mg of elemental calcium Oral TID WC  . carbamazepine  200 mg Oral QHS  . darbepoetin (ARANESP) injection - DIALYSIS  60 mcg Intravenous Q Mon-HD  . feeding supplement (NEPRO CARB STEADY)  237 mL Oral TID AC  . feeding supplement (PRO-STAT SUGAR FREE 64)  30 mL Oral BID  . gabapentin  300 mg Oral QHS  . insulin aspart  0-7 Units Subcutaneous TID WC  . insulin aspart  3 Units Subcutaneous TID WC  . insulin glargine  10 Units Subcutaneous Daily  . labetalol  300 mg Oral BID  . metoCLOPramide  5 mg Oral TID AC  . polyethylene glycol  17 g Oral BID  . promethazine  12.5 mg Intravenous Once in dialysis  . ramelteon  8 mg Oral QHS  . senna-docusate  2 tablet Oral Daily  . sevelamer carbonate  800 mg Oral TID WC  . sodium chloride flush  3 mL Intravenous Q12H  . Warfarin - Pharmacist Dosing Inpatient   Does not apply q1800   Dialysis Orders: [facility] MWF 4h 84kg 2/2 Hep 2500 R IJ/ L arm AVG - Calcitriol 0.25 mcg PO TIW  Assessment/Plan: 1. R Atrial Clot: TEE on 01/12/17 ID mass (2.8cmx1.7cm) consistent with thrombus. On anticoagulation, repeat TEE in 3 months (through 04/13/17) to check for resolution of clot PRIOR TO REMOVAL OF TDC. If unresolved, will be referred to possible surgical removal.  PCP Dr. Clayborne Dana, [place]. To manage coumadin. INR's to be  drawn at HD and sent to Dr. Tamala Julian. Do not remove TDC in 4 weeks if AVG used successfully. Keep in til f/u TEE completed and further management determined. Currently on heparin bridging to coumadin per pharmacy. 2. ESRD - MWF, HD yesterday, tolerated well, net UF 4L, post weight 89.3kg.  To be d/c today, if remains as inpatient orders written for tomorrow. 3. Anemia - Hgb trending down, 8.0 on 10/8. Last Aranesp (10mcg) on 10/2. To receive ^ dose ESA tomorrow at HD. 4. Secondary hyperparathyroidism - Ca and P in goal. Continue binder and calcitriol.  5. HTN/volume - Not to EDW during admission. Needs new EDW at d/c. BP improved post HD, continue labetalol.  6. Nutrition - Albumin decreased, pushed protein. Nepro TID, renavite and renal/carb modified diet.  7.  s/p new L AVG 10/1 Dr. Oneida Alar - previous AVF poorly maturing - possible allergic reaction to Gortex. FU with Dr. Oneida Alar as outpatient this Thurs per [facility] note.  8. DMT1- assessed by DM coordinator, recommendations made for insulin at d/c 9. Gastroparesis/constipation - On Reglan, Zofran and phenergan prn per primary, prn laxatives  10. Lane to d/c from renal standpoint. Continue HD on regular schedule as outpatient.    [name], PA-C [facility] 01/23/2017,9:13 AM  LOS: 11 days

## 2017-01-23 NOTE — Progress Notes (Signed)
Inpatient Diabetes Program Recommendations  AACE/ADA: New Consensus Statement on Inpatient Glycemic Control (2015)  Target Ranges:  Prepandial:   less than 140 mg/dL      Peak postprandial:   less than 180 mg/dL (1-2 hours)      Critically ill patients:  140 - 180 mg/dL   Results for Carrie [name], Carrie [name] (MRN [id number]) as of 01/23/2017 07:42  Ref. Range 01/21/2017 12:54 01/21/2017 16:34 01/21/2017 20:34 01/22/2017 07:40 01/22/2017 16:51  Glucose-Capillary Latest Ref Range: 65 - 99 mg/dL 244 (H) 239 (H) 261 (H) 193 (H) 278 (H)   Review of Glycemic Control Current orders for Inpatient glycemic control: Lantus 10 units daily, Novolog 3 units TID with meals, Novolog 0-9 units TID with meals  Inpatient Diabetes Program Recommendations: Insulin - Basal: Please consider increasing Lantus to 12 units daily. Insulin - Meal Coverage: Please consider increasing meal coverage to Novolog 5 units TID with meals if patient eats at least 50% of meals. Outpatient DM medications: Per Diabetes Coordinator note on 01/18/17, patient would like to go back on Lantus and Novolog for DM control as an outpatient. Therefore, please discontinue NPH at discharge and prescribe Lantus and Novolog (similar regimen to being used as an inpatient if DM is fairly well controlled on that regimen).  Thanks, Barnie Alderman, RN, MSN, CDE Diabetes Coordinator Inpatient Diabetes Program [phone] (Team Pager from 8am to 5pm)

## 2017-01-23 NOTE — Progress Notes (Addendum)
Subjective: Patient seen and examined. No acute events overnight. She denies shortness of breath, palpitations or chest pain. She was able to have a BM yesterday. No signs of bleeding. She does express concern about the swelling in her left arm and is concerned it is infected. Patient was reassured that she has no signs and/or symptoms of infection and we have been closely monitoring her arm, vital signs, and CBC.  Objective:  Vital signs in last 24 hours: Vitals:   01/22/17 1436 01/22/17 1654 01/22/17 2037 01/23/17 0556  BP: (!) 166/94 (!) 145/86 (!) 147/91 135/78  Pulse: 85 95 87 84  Resp: 18 18 18 18   Temp: 98.9 F (37.2 C) 99.3 F (37.4 C) 99.7 F (37.6 C) 98.6 F (37 C)  TempSrc: Oral Oral Oral Oral  SpO2: 100% 100% 100% 98%  Weight: 196 lb 13.9 oz (89.3 kg)  197 lb 9.6 oz (89.6 kg)   Height:       General: Sitting comfortably on the side of bed, NAD HEENT: [place]/AT, EOMI, no scleral icterus Cardiac: RRR, No R/M/G appreciated Pulm: normal effort, CTAB Abd: soft, non tender, non distended, BS normal Ext: extremities well perfused, no peripheral edema; left AV graft insertion site clean and dry, +left forearm swelling with left hand swelling stable, no erythema, radial pulse intact, hand is warm, grip strength 4/5 in left hand, sensation grossly intact, stable numbness in middle fingers Neuro: alert and oriented X3, cranial nerves II-XII grossly intact  Assessment/Plan:  Principal Problem:   Right atrial mass Active Problems:   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 CTA chest negative for PE.  Currently on heparin gtt and warfarin with plan to anticoagulate with warfarin for 3 months. Follow up TEE in 3 months. Plan for [facility] removal at that time if clot resolved, if not refer to CT surgery for options regarding  removal.  PT 22.8, INR 2.03.  -Low therapeutic range, will continue heparin gtt today and recheck PT, INR tomorrow   -Has received three days of Warfarin 10 mg daily, continue warfarin dosing per pharmacy  Constipation Resolved. Will continue bowel regimen.  -Senokot 2 tablets daily -Miralax BID    ESRD 2/2 to poorly controlled T1DM and HTN, on HD MWF. HD cath placed on 08/2016. Left UE continues to be swollen, however is slowly improving daily. There are no signs of DVT or arterial clot, no signs of infection, and no signs of compartment syndrome.  - Percocet q4 PRN for pain management, Topical Voltaren gel, Benadryl q6 hours PRN; follow up appointment scheduled with vascular surgery on 10/11 - Removal of TDC in 3 months following anticoagulation if clot resolved  - Nephrology following   T1DM Uncontrolled, complicated by peripheral neuropathy and gastroparesis. Most recent hemoglobin A1C 8.5 (11/2016).  Patient seen by diabetes educator will appreciate recommendations upon discharge.  - SQ Lantus 10U qAM + Novolog 3U TID with meals   - SSI-S  - Continue gabapentin 300 MG QHS - Continue antiemetics: reglan, zofran, and phenergan PRN for nausea  HTN -Typically normotensive after dialysis -Continue home labetalol 300 mg BID   F: none E: will continue to monitor N: Renal + CMdiet   VTE ppx: Heparin gtt   Code status: Full code, confirmed on admission   Dispo: Anticipated discharge  in 1 day.   Arvil Chaco, MD Internal Medicine PGY1 Pager # [phone]

## 2017-01-23 NOTE — Progress Notes (Signed)
[facility] [place]  Subjective  - arm still swollen and sore   Objective 134/73 86 98.2 F (36.8 C) (Oral) 18 99%  Intake/Output Summary (Last 24 hours) at 01/23/17 1039 Last data filed at 01/23/17 0900  Gross per 24 hour  Intake              370 ml  Output             4000 ml  Net            -3630 ml   + bruit in graft Swelling in forearm and hand  Assessment/Planning: Still with left arm swelling Continue to elevate Instructed pt to put arm through full range of motion so she does not get elbow contracture  Ruta Hinds 01/23/2017 10:39 AM --  Laboratory Lab Results:  Recent Labs  01/21/17 0301 01/22/17 0340  WBC 7.4 8.0  HGB 8.8* 8.0*  HCT 27.4* 25.2*  PLT 298 301   BMET  Recent Labs  01/21/17 0301 01/22/17 0340  NA 130* 129*  K 3.8 4.3  CL 92* 92*  CO2 27 27  GLUCOSE 223* 207*  BUN 30* 36*  CREATININE 7.19* 8.77*  CALCIUM 9.4 9.0    COAG Lab Results  Component Value Date   INR 2.03 01/23/2017   INR 2.09 01/22/2017   INR 1.81 01/21/2017   No results found for: PTT

## 2017-01-23 NOTE — Progress Notes (Signed)
ANTICOAGULATION CONSULT NOTE - Follow Up Consult  Pharmacy Consult for Heparin and Coumadin Indication: R-atrial mass thought to be [facility] thrombus  Patient Measurements: Height: 5\' 8"  (172.7 cm) Weight: 197 lb 9.6 oz (89.6 kg) IBW/kg (Calculated) : 63.9 Heparin Dosing Weight: 89 kg  Vital Signs: Temp: 98.6 F (37 C) (10/09 0556) Temp Source: Oral (10/09 0556) BP: 135/78 (10/09 0556) Pulse Rate: 84 (10/09 0556)  Labs:  Recent Labs  01/21/17 0301 01/22/17 0340 01/23/17 0426  HGB 8.8* 8.0*  --   HCT 27.4* 25.2*  --   PLT 298 301  --   LABPROT 20.8* 23.3* 22.8*  INR 1.81 2.09 2.03  HEPARINUNFRC 0.37 0.38 0.47  CREATININE 7.19* 8.77*  --    ESRD  Assessment:  61 YOF with ESRD on HD who was found to have right atrial mass . TEE on 9/27 showed large right atrial mass thought to be a thrombus attached to tip of HD catheter. CT on 9/30 showed no evidence of PE. Pharmacy is assisting with heparin bridge to a therapeutic INR with warfarin. Heparin level remains therapeutic this morning at 0.47. INR is low-end of therapeutic at 2.03. No issues with bleeding or heparin drip overnight noted. Continues on Tegretol XR which can decrease warfarin effect/increase requirement.    Goal of Therapy:  INR 2-3 Heparin level 0.3-0.7 units/ml Monitor platelets by anticoagulation protocol: Yes   Plan:  Continue heparin infusion 1050 units/hr  Warfarin 10mg  PO x 1 this evening  Daily heparin level, PT/INR and CBC. Despite INR being therapeutic x 2 days now, the INR is on the low end of the range and plan is to continue heparin bridge for one more day to ensure the INR remains therapeutic tomorrow. If INR still >2 on 10/10 will likely discharge home.  May need 7.5-10 mg daily as starting discharge dose of Coumadin.  Suggest prescribing 5 mg tablets, for more flexibility if dose need to change over time.  Jalene Mullet, Pharm.D. PGY1 Pharmacy Resident 01/23/2017 9:52 AM Main Pharmacy:  [phone]

## 2017-01-24 LAB — BASIC METABOLIC PANEL
ANION GAP: 12 (ref 5–15)
BUN: 35 mg/dL — AB (ref 6–20)
CALCIUM: 8.9 mg/dL (ref 8.9–10.3)
CO2: 26 mmol/L (ref 22–32)
Chloride: 90 mmol/L — ABNORMAL LOW (ref 101–111)
Creatinine, Ser: 8.04 mg/dL — ABNORMAL HIGH (ref 0.44–1.00)
GFR calc Af Amer: 7 mL/min — ABNORMAL LOW (ref >60)
GFR calc non Af Amer: 6 mL/min — ABNORMAL LOW (ref >60)
GLUCOSE: 179 mg/dL — AB (ref 65–99)
Potassium: 4 mmol/L (ref 3.5–5.1)
Sodium: 128 mmol/L — ABNORMAL LOW (ref 135–145)

## 2017-01-24 LAB — CBC
HEMATOCRIT: 24.2 % — AB (ref 36.0–46.0)
Hemoglobin: 7.7 g/dL — ABNORMAL LOW (ref 12.0–15.0)
MCH: 29.8 pg (ref 26.0–34.0)
MCHC: 31.8 g/dL (ref 30.0–36.0)
MCV: 93.8 fL (ref 78.0–100.0)
PLATELETS: 313 10*3/uL (ref 150–400)
RBC: 2.58 MIL/uL — ABNORMAL LOW (ref 3.87–5.11)
RDW: 16.3 % — AB (ref 11.5–15.5)
WBC: 6.9 10*3/uL (ref 4.0–10.5)

## 2017-01-24 LAB — PROTIME-INR
INR: 2.45
Prothrombin Time: 26.4 seconds — ABNORMAL HIGH (ref 11.4–15.2)

## 2017-01-24 LAB — GLUCOSE, CAPILLARY: Glucose-Capillary: 180 mg/dL — ABNORMAL HIGH (ref 65–99)

## 2017-01-24 LAB — HEPARIN LEVEL (UNFRACTIONATED): HEPARIN UNFRACTIONATED: 0.42 [IU]/mL (ref 0.30–0.70)

## 2017-01-24 MED ORDER — INSULIN ASPART 100 UNIT/ML ~~LOC~~ SOLN: 3.0000 [IU] | Freq: Three times a day (TID) | SUBCUTANEOUS | 11 refills | Status: DC

## 2017-01-24 MED ORDER — PROMETHAZINE HCL 25 MG PO TABS
ORAL_TABLET | ORAL | Status: AC
  Administered 2017-01-24: 12.5 mg via ORAL
  Filled 2017-01-24: qty 1

## 2017-01-24 MED ORDER — WARFARIN SODIUM 7.5 MG PO TABS
7.5000 mg | ORAL_TABLET | Freq: Once | ORAL | Status: AC
  Administered 2017-01-24: 7.5 mg via ORAL
  Filled 2017-01-24 (×2): qty 1

## 2017-01-24 MED ORDER — DARBEPOETIN ALFA 100 MCG/0.5ML IJ SOSY
PREFILLED_SYRINGE | INTRAMUSCULAR | Status: AC
  Administered 2017-01-24: 100 ug via INTRAVENOUS
  Filled 2017-01-24: qty 0.5

## 2017-01-24 MED ORDER — DICLOFENAC SODIUM 1 % TD GEL: 2.0000 g | Freq: Four times a day (QID) | TRANSDERMAL | 0 refills | Status: DC

## 2017-01-24 MED ORDER — CALCITRIOL 0.25 MCG PO CAPS
ORAL_CAPSULE | ORAL | Status: AC
Start: 2017-01-24 — End: 2017-01-24
  Administered 2017-01-24: 0.25 ug via ORAL
  Filled 2017-01-24: qty 1

## 2017-01-24 MED ORDER — DARBEPOETIN ALFA 100 MCG/0.5ML IJ SOSY
100.0000 ug | PREFILLED_SYRINGE | INTRAMUSCULAR | Status: DC
  Administered 2017-01-24: 100 ug via INTRAVENOUS

## 2017-01-24 MED ORDER — INSULIN GLARGINE 100 UNIT/ML ~~LOC~~ SOLN: 12.0000 [IU] | Freq: Every day | SUBCUTANEOUS | 11 refills | Status: DC

## 2017-01-24 MED ORDER — ONDANSETRON HCL 4 MG/2ML IJ SOLN
INTRAMUSCULAR | Status: AC
  Administered 2017-01-24: 4 mg via INTRAVENOUS
  Filled 2017-01-24: qty 2

## 2017-01-24 MED ORDER — OXYCODONE-ACETAMINOPHEN 5-325 MG PO TABS: 1.0000 | ORAL_TABLET | Freq: Four times a day (QID) | ORAL | 0 refills | Status: DC | PRN

## 2017-01-24 NOTE — Procedures (Signed)
I was present at this dialysis session. I have reviewed the session itself and made appropriate changes.   INR now 2.45.  Hb trending down 7.7 today.  Missed ESA 10/8, will give today at higher dose 165mcg. Denies overt losses.  2K bath.  Check Hb Friday at [facility].     Filed Weights   01/22/17 1436 01/22/17 2037 01/23/17 2127  Weight: 89.3 kg (196 lb 13.9 oz) 89.6 kg (197 lb 9.6 oz) 89.4 kg (197 lb 1.5 oz)     Recent Labs Lab 01/21/17 0301  01/24/17 0428  NA 130*  < > 128*  K 3.8  < > 4.0  CL 92*  < > 90*  CO2 27  < > 26  GLUCOSE 223*  < > 179*  BUN 30*  < > 35*  CREATININE 7.19*  < > 8.04*  CALCIUM 9.4  < > 8.9  PHOS 3.5  --   --   < > = values in this interval not displayed.   Recent Labs Lab 01/21/17 0301 01/22/17 0340 01/24/17 0428  WBC 7.4 8.0 6.9  HGB 8.8* 8.0* 7.7*  HCT 27.4* 25.2* 24.2*  MCV 93.5 93.3 93.8  PLT 298 301 313    Scheduled Meds: . bisacodyl  10 mg Rectal Daily  . calcitRIOL  0.25 mcg Oral Q M,W,F-HD  . calcium carbonate  400 mg of elemental calcium Oral TID WC  . carbamazepine  200 mg Oral QHS  . darbepoetin (ARANESP) injection - DIALYSIS  100 mcg Intravenous Q Wed-HD  . feeding supplement (NEPRO CARB STEADY)  237 mL Oral TID AC  . feeding supplement (PRO-STAT SUGAR FREE 64)  30 mL Oral BID  . gabapentin  300 mg Oral QHS  . insulin aspart  0-7 Units Subcutaneous TID WC  . insulin aspart  3 Units Subcutaneous TID WC  . insulin glargine  12 Units Subcutaneous Daily  . labetalol  300 mg Oral BID  . metoCLOPramide  5 mg Oral TID AC  . polyethylene glycol  17 g Oral BID  . promethazine  12.5 mg Intravenous Once in dialysis  . ramelteon  8 mg Oral QHS  . senna-docusate  2 tablet Oral Daily  . sevelamer carbonate  800 mg Oral TID WC  . sodium chloride flush  3 mL Intravenous Q12H  . Warfarin - Pharmacist Dosing Inpatient   Does not apply q1800   Continuous Infusions: . sodium chloride 10 mL/hr at 01/15/17 0938  . heparin 1,050 Units/hr  (01/23/17 2200)  . sodium chloride     PRN Meds:.acetaminophen **OR** acetaminophen, diclofenac sodium, diphenhydrAMINE, ondansetron (ZOFRAN) IV, ondansetron, oxyCODONE-acetaminophen, promethazine **OR** promethazine, RESOURCE THICKENUP CLEAR, sodium chloride flush   Pearson Grippe  MD 01/24/2017, 8:28 AM

## 2017-01-24 NOTE — Progress Notes (Signed)
   Subjective: Patient seen and examined. No acute events overnight. Continues to have left arm swelling and associated pain. She is amenable to discharge today.  Objective:  Vital signs in last 24 hours: Vitals:   01/24/17 0830 01/24/17 0900 01/24/17 0930 01/24/17 1010  BP: (!) 159/90 (!) 142/81 132/82 (!) 95/56  Pulse: 88 87 87 86  Resp:      Temp:      TempSrc:      SpO2:      Weight:      Height:       General: Laying in bed comfortably in HD, NAD HEENT: [place]/AT, EOMI, no scleral icterus Cardiac: RRR, No R/M/G appreciated Pulm: normal effort, CTAB Abd: soft, non tender, non distended, BS normal Ext: extremities well perfused, no lower extremity edema; left AV graft insertion site clean and dry, stable left forearm and hand swelling, no erythema, radial pulse intact, hand is warm, grip strength 4/5 in left hand, sensation grossly intact, stable numbness in middle fingers Neuro: alert and oriented X3, cranial nerves II-XII grossly intact  Assessment/Plan:  Principal Problem:   Right atrial mass Active Problems:   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 CTA chest negative for PE.  Currently on heparin gtt and warfarin with plan to anticoagulate with warfarin for 3 months. Follow up TEE in 3 months. Plan for [facility] removal at that time if clot resolved, if not refer to CT surgery for options regarding removal.  PT 26.4, INR 2.45.  -Heparin gtt stopped with plans for discharge today on Warfarin  -Has received 4 days of Warfarin 10 mg daily, per pharmacy recommends 7.5 mg daily upon discharge -INR will be checked during HD MWF, PCP Dr. Winifred Olive, Family Medicine at [facility] will be managing Warfarin dosing for the patient   Ph: [phone]   Fax: [phone]   ESRD 2/2 to poorly controlled T1DM  and HTN, on HD MWF. HD cath placed on 08/2016. Left UE continues to be swollen, however is slowly improving daily. There are no signs of DVT or arterial clot, no signs of infection, and no signs of compartment syndrome.  - Percocet q4 PRN for pain management, Topical Voltaren gel, Benadryl q6 hours PRN; follow up appointment scheduled with vascular surgery on 10/11 - Removal of TDC in 3 months following anticoagulation if clot resolved  - Nephrology following   T1DM Uncontrolled, complicated by peripheral neuropathy and gastroparesis. Most recent hemoglobin A1C 8.5 (11/2016).  Patient seen by diabetes educator will appreciate recommendations upon discharge.  - SQ Lantus 12 qAM + Novolog 3U TID with meals   - SSI-S  - Continue gabapentin 300 MG QHS - Continue antiemetics: reglan, zofran, and phenergan PRN for nausea  HTN -Typically normotensive after dialysis -Continue home labetalol 300 mg BID   F: none E: will continue to monitor N: Renal + CMdiet   VTE ppx: Heparin gtt   Code status: Full code, confirmed on admission   Dispo: Anticipated discharge today.   Carrie Chaco, MD Internal Medicine PGY1 Pager # [phone]

## 2017-01-24 NOTE — Progress Notes (Signed)
[facility] [place]  Subjective  - still swollen   Objective (!) 119/58 87 97.7 F (36.5 C) (Oral) 20 97%  Intake/Output Summary (Last 24 hours) at 01/24/17 0829 Last data filed at 01/24/17 8563  Gross per 24 hour  Intake              900 ml  Output                0 ml  Net              900 ml   Left arm edema about the same as yesterday Able to fully extend elbow  Assessment/Planning: Slow improvement Has outpt follow up with me on Thursday  Ruta Hinds 01/24/2017 8:29 AM --  Laboratory Lab Results:  Recent Labs  01/22/17 0340 01/24/17 0428  WBC 8.0 6.9  HGB 8.0* 7.7*  HCT 25.2* 24.2*  PLT 301 313   BMET  Recent Labs  01/22/17 0340 01/24/17 0428  NA 129* 128*  K 4.3 4.0  CL 92* 90*  CO2 27 26  GLUCOSE 207* 179*  BUN 36* 35*  CREATININE 8.77* 8.04*  CALCIUM 9.0 8.9    COAG Lab Results  Component Value Date   INR 2.45 01/24/2017   INR 2.03 01/23/2017   INR 2.09 01/22/2017   No results found for: PTT

## 2017-01-24 NOTE — Progress Notes (Signed)
ANTICOAGULATION CONSULT NOTE - Follow Up Consult  Pharmacy Consult for Warfarin  Indication: R-atrial mass thought to be [facility] thrombus  Patient Measurements: Height: 5\' 8"  (172.7 cm) Weight: 201 lb 8 oz (91.4 kg) IBW/kg (Calculated) : 63.9 Heparin Dosing Weight: 89 kg  Vital Signs: Temp: 97.8 F (36.6 C) (10/10 0740) Temp Source: Oral (10/10 0740) BP: 95/56 (10/10 1010) Pulse Rate: 86 (10/10 1010)  Labs:  Recent Labs  01/22/17 0340 01/23/17 0426 01/24/17 0428  HGB 8.0*  --  7.7*  HCT 25.2*  --  24.2*  PLT 301  --  313  LABPROT 23.3* 22.8* 26.4*  INR 2.09 2.03 2.45  HEPARINUNFRC 0.38 0.47 0.42  CREATININE 8.77*  --  8.04*   ESRD  Assessment:  35 YOF with ESRD on HD who was found to have right atrial mass . TEE on 9/27 showed large right atrial mass thought to be a thrombus attached to tip of HD catheter. CT on 9/30 showed no evidence of PE. Pharmacy is assisting with heparin bridge to a therapeutic INR with warfarin. Heparin level was therapeutic this morning at 0.42, and has been discontinued with therapeutic INR x 3 days. INR this morning in goal range at 2.45. Continues on Tegretol XR which can decrease warfarin effect/increase requirement.    Goal of Therapy:  INR 2-3 Heparin level 0.3-0.7 units/ml Monitor platelets by anticoagulation protocol: Yes   Plan:  Discontinued heparin drip  Warfarin 7.5mg  PO x 1 this evening before discharge  Daily INR  May need 7.5-10 mg daily as starting discharge dose of warfain.  Suggest prescribing 5 mg tablets, for more flexibility if dose need to change over time.  Jalene Mullet, Pharm.D. PGY1 Pharmacy Resident 01/24/2017 10:29 AM Main Pharmacy: [phone]

## 2017-01-24 NOTE — Progress Notes (Signed)
Patient discharged to home, IV removed, AVS reviewed, Prescriptions provided, coumadin education reinforced. Patient confirmed she had all belongings.

## 2017-01-25 ENCOUNTER — Ambulatory Visit (INDEPENDENT_AMBULATORY_CARE_PROVIDER_SITE_OTHER): Payer: BLUE CROSS/BLUE SHIELD | Admitting: Vascular Surgery

## 2017-01-25 ENCOUNTER — Other Ambulatory Visit: Payer: Self-pay | Admitting: Internal Medicine

## 2017-01-25 ENCOUNTER — Encounter: Payer: Self-pay | Admitting: Vascular Surgery

## 2017-01-25 VITALS — BP 131/85 | HR 88 | Temp 99.4°F | Resp 16 | Ht 68.0 in | Wt 197.5 lb

## 2017-01-25 DIAGNOSIS — Z992 Dependence on renal dialysis: Secondary | ICD-10-CM

## 2017-01-25 DIAGNOSIS — N186 End stage renal disease: Secondary | ICD-10-CM

## 2017-01-25 MED ORDER — WARFARIN SODIUM 5 MG PO TABS: 7.5000 mg | ORAL_TABLET | Freq: Once | ORAL | 0 refills | Status: DC

## 2017-01-25 MED ORDER — OXYCODONE-ACETAMINOPHEN 5-325 MG PO TABS: 1.0000 | ORAL_TABLET | Freq: Three times a day (TID) | ORAL | 0 refills | Status: DC | PRN

## 2017-01-25 NOTE — Progress Notes (Signed)
Patient is a 36 year old female who returns for follow-up today. She had placement of a left forearm AV graft on 01/15/2017. She still has significant swelling in the forearm and hand with pain around the graft. She denies any numbness or tingling in her hand at this point. Overall her symptoms have improved over the last few days but she is still very uncomfortable. She is unable to do minimal activities with her left arm due to soreness. She has been working on stretching exercises to prevent her from getting elbow contracture. She currently is dialyzing via a right-sided catheter.  She is on Coumadin due to accumulated thrombus on her catheter.  Physical exam:  Vitals:   01/25/17 1004  BP: 131/85  Pulse: 88  Resp: 16  Temp: 99.4 F (37.4 C)  TempSrc: Oral  SpO2: 96%  Weight: 197 lb 8 oz (89.6 kg)  Height: 5\' 8"  (1.727 m)   Left upper extremity: Healing antecubital and upper arm and distal forearm incisions. Mild erythema over the tunneled graft site. Audible bruit and graft palpable left radial pulse hand pink and warm  Assessment: Slowly improving arm swelling left upper extremity most likely Gore-Tex reaction  Plan: The patient will follow-up with me in 2 weeks to see if her symptoms have continued to improve. Possible central vein stenosis but less likely since she did not have any central vein symptoms prior to her graft placement with an existing fistula  I told her we would hold on cannulating the graft until her arm pain symptoms have improved significantly as well as the swelling.  Percocet #[id number] dispensed today no further refills  Ruta Hinds, MD [facility] [place] Office: [phone] Pager: [phone]

## 2017-01-28 ENCOUNTER — Emergency Department (HOSPITAL_COMMUNITY)
Admission: EM | Admit: 2017-01-28 | Discharge: 2017-01-28 | Disposition: A | Discharge status: Home / Self Care | Payer: BLUE CROSS/BLUE SHIELD | Attending: Emergency Medicine | Admitting: Emergency Medicine

## 2017-01-28 ENCOUNTER — Encounter (HOSPITAL_COMMUNITY): Payer: Self-pay | Admitting: Emergency Medicine

## 2017-01-28 DIAGNOSIS — T829XXD Unspecified complication of cardiac and vascular prosthetic device, implant and graft, subsequent encounter: Secondary | ICD-10-CM

## 2017-01-28 DIAGNOSIS — Z794 Long term (current) use of insulin: Secondary | ICD-10-CM | POA: Insufficient documentation

## 2017-01-28 DIAGNOSIS — Z7901 Long term (current) use of anticoagulants: Secondary | ICD-10-CM | POA: Diagnosis not present

## 2017-01-28 DIAGNOSIS — E1122 Type 2 diabetes mellitus with diabetic chronic kidney disease: Secondary | ICD-10-CM | POA: Diagnosis not present

## 2017-01-28 DIAGNOSIS — Z79899 Other long term (current) drug therapy: Secondary | ICD-10-CM | POA: Diagnosis not present

## 2017-01-28 DIAGNOSIS — T8249XD Other complication of vascular dialysis catheter, subsequent encounter: Secondary | ICD-10-CM | POA: Diagnosis not present

## 2017-01-28 DIAGNOSIS — N186 End stage renal disease: Secondary | ICD-10-CM | POA: Insufficient documentation

## 2017-01-28 DIAGNOSIS — Y828 Other medical devices associated with adverse incidents: Secondary | ICD-10-CM | POA: Insufficient documentation

## 2017-01-28 NOTE — ED Triage Notes (Signed)
Pt. Stated, I had a graft surgery on Oct. 1 and I had a scab. At the site , last dialysis on Friday used subclavian. Started bleeding at the surgical site this morning and unable to get it stopped.

## 2017-01-28 NOTE — Discharge Instructions (Signed)
Change the dressing on the wound of the left arm as needed.  Try to keep some pressure directly on the wounds to help minimize the amount of bleeding.  The vascular surgery office will call you to schedule a follow-up appointment to be seen this week.  Call them if you do not hear from them by 10 AM tomorrow.  Return here, if needed, for problems.

## 2017-01-28 NOTE — ED Notes (Addendum)
EDP at bedside. Pt dressing unwrapped, bleeding minimal. Dermabond applied, pt bandage left unwrapped in order to continue assessing site. Small amount of blood oozing from site, no purulent drianage or abnormal swelling noted to the site.

## 2017-01-28 NOTE — ED Provider Notes (Signed)
[facility] DEPT Provider Note   CSN: [id number] Arrival date & time: 01/28/17  1043     History   Chief Complaint Chief Complaint  Patient presents with  . Vascular Access Problem    bleeding    HPI Carrie [name] is a 36 y.o. female.   He is here today, concerned for bleeding from a vascular access revision site, left AV graft (01/15/17).  She was seen by her vascular surgeon, 3 days ago at that time she was noted to have swelling around the graft insertion site.  He thought that she was having a Gore-Tex graft reaction.  She denies numbness or coldness of her left hand.  She denies shortness of breath, cough, chest pain, weakness or dizziness.  She feels like the bleeding is worse if she coughs, passes gas, or moves the left arm.  She is continuing to dialyze using a right sided chest catheter.  She is anticoagulated, with Coumadin.  There are no other known modifying factors.  HPI  Past Medical History:  Diagnosis Date  . Diabetes mellitus with complication ([place])   . Diabetic neuropathy ([place])   . Gastroparesis   . HA (headache)   . Hemodialysis access site with arteriovenous graft ([place])   . Hypertension   . Neuropathy   . Renal disorder   . Trigeminal neuralgia     Patient Active Problem List   Diagnosis Date Noted  . 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 Pregnancy test positive 12/22/2016  . Amenorrhea 12/12/2016  . Diabetes ([place]) 09/16/2016  . ESRD on hemodialysis ([place])   . Gastroparesis   . Intractable vomiting 09/04/2016  . 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 Intractable vomiting with nausea 04/04/2015  . AKI (acute kidney injury) ([place]) 04/04/2015  . DKA (diabetic ketoacidoses) ([place]) 04/03/2015  . Trigeminal neuralgia 12/12/2013  . HA (headache)   . DKA, type 2 ([place]) 10/24/2012  . Contact dermatitis 10/24/2012  . SORE THROAT 03/22/2010  . 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 FATIGUE 05/25/2009  . VAGINAL DISCHARGE 01/28/2009  .  Carbuncle and furuncle of unspecified site 12/29/2008  . HYPERCHOLESTEROLEMIA 06/12/2008  . MICROALBUMINURIA 06/12/2008  . MONILIAL VAGINITIS 11/14/2007  . INGUINAL PAIN, BILATERAL 11/14/2007  . DYSURIA 10/17/2007  . CYSTITIS 09/05/2007  . DELAYED MENSES 09/05/2007  . RHINOCONJUNCTIVITIS, ALLERGIC 07/19/2007  . DENTAL PAIN 03/28/2007  . HYPERLIPIDEMIA 09/01/2006  . PELVIC INFLAMMATORY DISEASE 09/01/2006  . ABORTION, SPONTANEOUS 09/01/2006  . ABSCESS 09/01/2006  . HX, PERSONAL, PAST NONCOMPLIANCE 09/01/2006    Past Surgical History:  Procedure Laterality Date  . AV FISTULA PLACEMENT Left 09/07/2016   Procedure: Left arm 1st Stage Basilic AV Fistula;  Surgeon: [name], MD;  Location: [facility] OR;  Service: Vascular;  Laterality: Left;  . EYE SURGERY    . INSERTION OF DIALYSIS CATHETER Right 09/07/2016   Procedure: INSERTION OF DIALYSIS CATHETER;  Surgeon: [name], MD;  Location: [place];  Service: Vascular;  Laterality: Right;  . None    . REVISON OF ARTERIOVENOUS FISTULA Left 01/15/2017   Procedure: INSERTION OF  AV GORE-TEX GRAFT;  Surgeon: Elam Dutch, MD;  Location: [facility];  Service: Vascular;  Laterality: Left;  . TEE WITHOUT CARDIOVERSION N/A 01/11/2017   Procedure: TRANSESOPHAGEAL ECHOCARDIOGRAM (TEE);  Surgeon: Jerline Pain, MD;  Location[facility] ENDOSCOPY;  Service: Cardiovascular;  Laterality: N/A;    OB History    Gravida Para Term Preterm AB Living   7 1  1 5 1    SAB TAB Ectopic Multiple Live Births   2 3             Home Medications    Prior to Admission medications   Medication Sig Start Date End Date Taking? Authorizing Provider  acetaminophen (TYLENOL) 325 MG tablet Take 650 mg by mouth every 6 (six) hours as needed for mild pain.    [provider]  carbamazepine (TEGRETOL XR) 400 MG 12 hr tablet Take 200 mg by mouth once.    [provider]  diclofenac sodium (VOLTAREN) 1 % GEL Apply 2 g topically 4 (four) times daily. 01/24/17    Melanee Spry, MD  diphenhydrAMINE (BENADRYL) 25 MG tablet Take 50 mg by mouth at bedtime as needed for sleep.    [provider]  diphenhydramine-acetaminophen (TYLENOL PM) 25-500 MG TABS tablet Take 2 tablets by mouth at bedtime.    [provider]  gabapentin (NEURONTIN) 300 MG capsule Take 1 capsule (300 mg total) by mouth at bedtime. Patient taking differently: Take 300 mg by mouth 3 (three) times daily.  09/12/16   Rosita Fire, MD  insulin aspart (NOVOLOG FLEXPEN) 100 UNIT/ML FlexPen Inject 5 Units into the skin 3 (three) times daily with meals. And pen needles 4/day Patient taking differently: Inject 20 Units into the skin daily. And pen needles 4/day 12/22/16   Renato Shin, MD  insulin aspart (NOVOLOG) 100 UNIT/ML injection Inject 3 Units into the skin 3 (three) times daily with meals. Patient not taking: Reported on 01/25/2017 01/24/17   Melanee Spry, MD  Insulin Glargine (LANTUS SOLOSTAR) 100 UNIT/ML Solostar Pen Inject 10 Units into the skin daily at 10 pm. 12/22/16   Renato Shin, MD  insulin glargine (LANTUS) 100 UNIT/ML injection Inject 0.12 mLs (12 Units total) into the skin daily. Patient not taking: Reported on 01/25/2017 01/24/17   Melanee Spry, MD  labetalol (NORMODYNE) 300 MG tablet Take 300 mg by mouth See admin instructions. Take 300mg  tablet at bedtime on diaylsis days M, W, F. Take 300mg  tablet twice daily on all other days 08/25/16   [provider]  metoCLOPramide (REGLAN) 5 MG tablet Take 5 mg by mouth every 6 (six) hours as needed (nausea).     [provider]  ondansetron (ZOFRAN) 4 MG tablet Take 1 tablet (4 mg total) by mouth every 8 (eight) hours as needed for nausea or vomiting. 09/12/16   Rosita Fire, MD  oxyCODONE-acetaminophen (PERCOCET/ROXICET) 5-325 MG tablet Take 1-2 tablets by mouth every 6 (six) hours as needed for severe pain. 01/24/17   Melanee Spry, MD  oxyCODONE-acetaminophen  (PERCOCET/ROXICET) 5-325 MG tablet Take 1 tablet by mouth every 8 (eight) hours as needed for severe pain. 01/25/17   Elam Dutch, MD  promethazine (PHENERGAN) 25 MG tablet Take 1 tablet (25 mg total) by mouth every 8 (eight) hours as needed for nausea or vomiting. 09/12/16   Rosita Fire, MD  sevelamer carbonate (RENVELA) 800 MG tablet Take 800 mg by mouth 3 (three) times daily with meals.    [provider]  warfarin (COUMADIN) 5 MG tablet Take 1.5 tablets (7.5 mg total) by mouth one time only at 6 PM. 01/25/17 04/27/17  Lacroce, Hulen Shouts, MD    Family History Family History  Problem Relation Age of Onset  . Hyperlipidemia Mother   . Asthma Daughter   . Diabetes Maternal Grandmother   . Stroke Maternal Grandmother   . Kidney disease  Maternal Grandfather   . Kidney disease Paternal Grandmother     Social History Social History  Substance Use Topics  . Smoking status: Never Smoker  . Smokeless tobacco: Never Used  . Alcohol use No     Comment: former use     Allergies   No known allergies   Review of Systems Review of Systems  All other systems reviewed and are negative.    Physical Exam Updated Vital Signs BP (!) 164/99   Pulse 91   Temp 98.9 F (37.2 C) (Oral)   Resp 17   Ht 5\' 8"  (1.727 m)   Wt 89.4 kg (197 lb)   LMP 07/17/2016 (Within Weeks) Comment: Has not had a period since starting dialysis in May  SpO2 100%   BMI 29.95 kg/m   Physical Exam  Constitutional: She is oriented to person, place, and time. She appears well-developed and well-nourished. No distress.  HENT:  Head: Normocephalic and atraumatic.  Eyes: Pupils are equal, round, and reactive to light. Conjunctivae and EOM are normal.  Neck: Normal range of motion and phonation normal. Neck supple.  Cardiovascular: Normal rate.   Pulmonary/Chest: Effort normal.  Musculoskeletal: Normal range of motion.  Neurological: She is alert and oriented to person, place, and time.  She exhibits normal muscle tone.  Skin: Skin is warm and dry.  Psychiatric: She has a normal mood and affect. Her behavior is normal. Judgment and thought content normal.  Nursing note and vitals reviewed.    ED Treatments / Results  Labs (all labs ordered are listed, but only abnormal results are displayed) Labs Reviewed - No data to display  EKG  EKG Interpretation None       Radiology No results found.  Procedures Wound repair Date/Time: 01/28/2017 4:55 PM Performed by: Daleen Bo Authorized by: Daleen Bo  Consent: Verbal consent obtained. Consent given by: patient Patient understanding: patient states understanding of the procedure being performed Patient identity confirmed: verbally with patient Local anesthesia used: no  Anesthesia: Local anesthesia used: no  Sedation: Patient sedated: no Patient tolerance: Patient tolerated the procedure well with no immediate complications Comments: 3 areas of blood oozing from wound edges of recent vascular procedure, were closed with Dermabond.    (including critical care time)  Medications Ordered in ED Medications - No data to display   Initial Impression / Assessment and Plan / ED Course  I have reviewed the triage vital signs and the nursing notes.  Pertinent labs & imaging results that were available during my care of the patient were reviewed by me and considered in my medical decision making (see chart for details).  Clinical Course as of Jan 28 1654  Sun Jan 28, 2017  1654 Case was discussed with Dr. Donzetta Matters, on-call for vascular surgery; he believes that the patient has a subcutaneous hematoma, which will gradually resolve with time.  He will set the patient up to be seen in the office for reevaluation, this week.  [EW]    Clinical Course User Index [EW] Daleen Bo, MD     Patient Vitals for the past 24 hrs:  BP Temp Temp src Pulse Resp SpO2 Height Weight  01/28/17 1510 (!) 164/99 98.9 F  (37.2 C) Oral 91 17 100 % - -  01/28/17 1105 - - - - - - 5\' 8"  (1.727 m) 89.4 kg (197 lb)  01/28/17 1103 (!) 142/75 99.8 F (37.7 C) Oral (!) 103 16 98 % - -  5:34 PM Reevaluation with update and discussion. After initial assessment and treatment, an updated evaluation reveals very minimal bleeding, from wound, post application of Dermabond.  Light pressure dressing applied by nursing staff.Daleen Bo L    Final Clinical Impressions(s) / ED Diagnoses   Final diagnoses:  Complication of vascular access for dialysis, subsequent encounter  ESRD (end stage renal disease) ([place])    Postoperative wound bleeding, likely related to subcutaneous hematoma.  Doubt associated infection, arterial or venous clot.  Nursing Notes Reviewed/ Care Coordinated Applicable Imaging Reviewed Interpretation of Laboratory Data incorporated into ED treatment  The patient appears reasonably screened and/or stabilized for discharge and I doubt any other medical condition or other [facility] requiring further screening, evaluation, or treatment in the ED at this time prior to discharge.  Plan: Home Medications-continue usual medications; Home Treatments-rest, fluids; return here if the recommended treatment, does not improve the symptoms; Recommended follow up-vascular surgery follow-up within several days, return here if needed.   New Prescriptions New Prescriptions   No medications on file     Daleen Bo, MD 01/28/17 1735

## 2017-01-28 NOTE — ED Notes (Signed)
Per Dr. Eulis Foster, pt fistula wrapped with sterile 4x4 gauze and coband. This RN diligent not to wrap coband too tight around fistula site.

## 2017-01-28 NOTE — ED Notes (Signed)
Dermabond, suture cart, and supplies set up at bedside.

## 2017-01-28 NOTE — ED Triage Notes (Signed)
Wrapped surgical site at triage.

## 2017-01-28 NOTE — ED Notes (Signed)
Pt and family verbalized d/c instructions and follow up. All questions answered by this RN. Pt told to follow up with vascular. Pt ambulatory to lobby with steady gait.

## 2017-01-29 ENCOUNTER — Telehealth: Payer: Self-pay | Admitting: Vascular Surgery

## 2017-01-29 NOTE — Telephone Encounter (Signed)
Spoke to pt, she said that she already went to the ED and wants to wait to see CEF on 11/1.

## 2017-01-29 NOTE — Telephone Encounter (Signed)
-----   Message from Mena Goes, RN sent at 01/29/2017  8:48 AM EDT ----- Regarding: move up appt   ----- Message ----- From: Waynetta Sandy, MD Sent: 01/28/2017   8:48 PM To: Vvs Charge Pool  Ms. Jou is supposed to see Dr. Oneida Alar in 2 weeks but is having persistent pain that the graft site. If he or Vinnie Level can see her this week would be good to avoid another ED trip for her.   brandon

## 2017-01-30 ENCOUNTER — Telehealth: Payer: Self-pay | Admitting: Vascular Surgery

## 2017-01-30 NOTE — Telephone Encounter (Signed)
-----   Message from Mena Goes, RN sent at 01/30/2017 10:30 AM EDT ----- Regarding: Kelli Hope wants to come in after all I saved her a spot on NP schedule for Thursday since CEF will be here also. She is aware, let's see if she is compliant and comes in  :)    ----- Message ----- From: Waynetta Sandy, MD Sent: 01/28/2017   8:48 PM To: Vvs Charge Pool  Ms. Pierron is supposed to see Dr. Oneida Alar in 2 weeks but is having persistent pain that the graft site. If he or Vinnie Level can see her this week would be good to avoid another ED trip for her.   brandon

## 2017-01-31 ENCOUNTER — Encounter: Payer: Self-pay | Admitting: Endocrinology

## 2017-01-31 ENCOUNTER — Ambulatory Visit (INDEPENDENT_AMBULATORY_CARE_PROVIDER_SITE_OTHER): Payer: BLUE CROSS/BLUE SHIELD | Admitting: Endocrinology

## 2017-01-31 ENCOUNTER — Encounter: Payer: Self-pay | Admitting: Family

## 2017-01-31 VITALS — BP 180/110 | HR 98 | Wt 196.0 lb

## 2017-01-31 DIAGNOSIS — Z992 Dependence on renal dialysis: Secondary | ICD-10-CM | POA: Diagnosis not present

## 2017-01-31 DIAGNOSIS — N186 End stage renal disease: Secondary | ICD-10-CM | POA: Diagnosis not present

## 2017-01-31 DIAGNOSIS — E1122 Type 2 diabetes mellitus with diabetic chronic kidney disease: Secondary | ICD-10-CM

## 2017-01-31 DIAGNOSIS — Z794 Long term (current) use of insulin: Secondary | ICD-10-CM | POA: Diagnosis not present

## 2017-01-31 LAB — POCT GLYCOSYLATED HEMOGLOBIN (HGB A1C): HEMOGLOBIN A1C: 8.2

## 2017-01-31 MED ORDER — INSULIN ISOPHANE HUMAN 100 UNIT/ML KWIKPEN: 30.0000 [IU] | PEN_INJECTOR | SUBCUTANEOUS | 11 refills | Status: DC

## 2017-01-31 NOTE — Progress Notes (Signed)
Subjective:    Patient ID: Carrie [name], female    DOB: 1980/04/22, 36 y.o.   MRN: [id number]  HPI Pt returns for f/u of diabetes mellitus: DM type: Insulin-requiring type 2 Dx'ed: 9030 Complications: polyneuropathy, renal insufficiency, gastroparesis, and retinopathy Therapy: insulin since 2003 pregnancy.  GDM: never DKA: never Severe hypoglycemia: last episode was early 2018.  Pancreatitis: never.  Pancreatic imaging:  Other: she takes multiple daily injections.  Interval history:  No cbg record, but she says cbg varies from 101-291.  It is in general higher as the day goes on.  She was recently in the hospital.  Pt says she wants to go back to a qd insulin schedule.  As of now, she takes lantus 25/d, but no humalog.   Past Medical History:  Diagnosis Date  . Anemia   . Diabetic neuropathy ([place])   . Diabetic retinopathy ([place])   . ESRD (end stage renal disease) on dialysis [facility])    "MWF; [place]" (02/02/2017)  . Gastroparesis   . HA (headache)    "qd if I didn't take my RX" (02/02/2017)  . Hemodialysis access site with arteriovenous graft ([place])   . Hypertension   . Neuropathy   . Trigeminal neuralgia   . Type I diabetes mellitus ([place])     Past Surgical History:  Procedure Laterality Date  . AV FISTULA PLACEMENT Left 09/07/2016   Procedure: Left arm 1st Stage Basilic AV Fistula;  Surgeon: [name], MD;  Location[facility] OR;  Service: Vascular;  Laterality: Left;  . DILATION AND CURETTAGE OF UTERUS    . EXCISIONAL HEMORRHOIDECTOMY    . EYE SURGERY    . INSERTION OF DIALYSIS CATHETER Right 09/07/2016   Procedure: INSERTION OF DIALYSIS CATHETER;  Surgeon: [name], MD;  Location: [facility] OR;  Service: Vascular;  Laterality: Right;  . RETINAL LASER PROCEDURE Bilateral    "related to diabetes"  . REVISON OF ARTERIOVENOUS FISTULA Left 01/15/2017   Procedure: INSERTION OF  AV GORE-TEX GRAFT;  Surgeon: Elam Dutch, MD;  Location: [place];  Service: Vascular;   Laterality: Left;  . TEE WITHOUT CARDIOVERSION N/A 01/11/2017   Procedure: TRANSESOPHAGEAL ECHOCARDIOGRAM (TEE);  Surgeon: Jerline Pain, MD;  Location: [facility] ENDOSCOPY;  Service: Cardiovascular;  Laterality: N/A;    Social History   Social History  . Marital status: Single    Spouse name: N/A  . Number of children: 1  . Years of education: 20   Occupational History  . [employer]   Social History Main Topics  . Smoking status: Never Smoker  . Smokeless tobacco: Never Used  . Alcohol use 2.0 oz/week    4 Standard drinks or equivalent per week     Comment: 02/02/2017 "nothing in the last 6 months; never had problem w/it"  . Drug use: No  . Sexual activity: Yes   Other Topics Concern  . Not on file   Social History Narrative   Patient lives at home with her father and daughter.    Education some college.   Right handed   Caffeine Five sodas daily.             No current facility-administered medications on file prior to visit.    Current Outpatient Prescriptions on File Prior to Visit  Medication Sig Dispense Refill  . acetaminophen (TYLENOL) 325 MG tablet Take 650 mg by mouth every 6 (six) hours as needed for mild pain.    . carbamazepine (TEGRETOL XR)  400 MG 12 hr tablet Take 200 mg by mouth once.    . diclofenac sodium (VOLTAREN) 1 % GEL Apply 2 g topically 4 (four) times daily. 1 Tube 0  . diphenhydrAMINE (BENADRYL) 25 MG tablet Take 50 mg by mouth at bedtime as needed for sleep.    . diphenhydramine-acetaminophen (TYLENOL PM) 25-500 MG TABS tablet Take 2 tablets by mouth at bedtime.    . gabapentin (NEURONTIN) 300 MG capsule Take 1 capsule (300 mg total) by mouth at bedtime. (Patient taking differently: Take 300 mg by mouth 3 (three) times daily. )    . labetalol (NORMODYNE) 300 MG tablet Take 300 mg by mouth See admin instructions. Take 300mg  tablet at bedtime on diaylsis days M, W, F. Take 300mg  tablet twice daily on all other days  0  .  metoCLOPramide (REGLAN) 5 MG tablet Take 5 mg by mouth every 6 (six) hours as needed (nausea).     . ondansetron (ZOFRAN) 4 MG tablet Take 1 tablet (4 mg total) by mouth every 8 (eight) hours as needed for nausea or vomiting. 20 tablet 0  . oxyCODONE-acetaminophen (PERCOCET/ROXICET) 5-325 MG tablet Take 1-2 tablets by mouth every 6 (six) hours as needed for severe pain. 8 tablet 0  . oxyCODONE-acetaminophen (PERCOCET/ROXICET) 5-325 MG tablet Take 1 tablet by mouth every 8 (eight) hours as needed for severe pain. (Patient not taking: Reported on 02/01/2017) 25 tablet 0  . promethazine (PHENERGAN) 25 MG tablet Take 1 tablet (25 mg total) by mouth every 8 (eight) hours as needed for nausea or vomiting. 10 tablet 0  . sevelamer carbonate (RENVELA) 800 MG tablet Take 800 mg by mouth 3 (three) times daily with meals.    . warfarin (COUMADIN) 5 MG tablet Take 1.5 tablets (7.5 mg total) by mouth one time only at 6 PM. 90 tablet 0    Allergies  Allergen Reactions  . No Known Allergies     Family History  Problem Relation Age of Onset  . Hyperlipidemia Mother   . Asthma Daughter   . Diabetes Maternal Grandmother   . Stroke Maternal Grandmother   . Kidney disease Maternal Grandfather   . Kidney disease Paternal Grandmother     BP (!) 180/110   Pulse 98   Wt 196 lb (88.9 kg)   LMP 07/17/2016 (Within Weeks) Comment: Has not had a period since starting dialysis in May  SpO2 98%   BMI 29.80 kg/m    Review of Systems She denies hypoglycemia    Objective:   Physical Exam VITAL SIGNS:  See vs page GENERAL: no distress Pulses: foot pulses are intact bilaterally.   MSK: no deformity of the feet or ankles.  CV: no edema of the legs or ankles Skin:  no ulcer on the feet or ankles.  normal color and temp on the feet and ankles Neuro: sensation is intact to touch on the feet and ankles.     Lab Results  Component Value Date   HGBA1C 8.5 12/12/2016   Lab Results  Component Value Date    CREATININE 8.04 (H) 01/24/2017   BUN 35 (H) 01/24/2017   NA 128 (L) 01/24/2017   K 4.0 01/24/2017   CL 90 (L) 01/24/2017   CO2 26 01/24/2017      Assessment & Plan:  Insulin-requiring type 2 DM: The pattern of his cbg's indicates she needs a faster-acting qd insulin Renal failure: this is the likely reason for the cbg pattern.  Noncompliance with cbg recording: I  agree with her plan for a simpler regimen.    Patient Instructions  Here is a prescription, to change lantus to "NPH," 30 units each morning.  check your blood sugar 5 times a day: before the 3 meals, and at bedtime.  also check after meals, on a rotating basis.  if you have symptoms of your blood sugar being too high or too low.  please keep a record of the readings and bring it to your next appointment here (or you can bring the meter itself).  You can write it on any piece of paper.  please call us sooner if your blood sugar goes below 70, or if you have a lot of readings over 200.  Please come back for a follow-up appointment in 1 month.

## 2017-01-31 NOTE — Patient Instructions (Addendum)
Here is a prescription, to change lantus to "NPH," 30 units each morning.  check your blood sugar 5 times a day: before the 3 meals, and at bedtime.  also check after meals, on a rotating basis.  if you have symptoms of your blood sugar being too high or too low.  please keep a record of the readings and bring it to your next appointment here (or you can bring the meter itself).  You can write it on any piece of paper.  please call us sooner if your blood sugar goes below 70, or if you have a lot of readings over 200.  Please come back for a follow-up appointment in 1 month.

## 2017-02-01 ENCOUNTER — Ambulatory Visit (INDEPENDENT_AMBULATORY_CARE_PROVIDER_SITE_OTHER): Payer: Self-pay | Admitting: Family

## 2017-02-01 ENCOUNTER — Encounter: Payer: Self-pay | Admitting: Family

## 2017-02-01 ENCOUNTER — Inpatient Hospital Stay (HOSPITAL_COMMUNITY)
Admission: AD | Admit: 2017-02-01 | Discharge: 2017-02-05 | DRG: 314 | Disposition: A | Discharge status: Home / Self Care | Payer: BLUE CROSS/BLUE SHIELD | Source: Ambulatory Visit | Attending: Vascular Surgery | Admitting: Vascular Surgery

## 2017-02-01 VITALS — BP 140/79 | HR 94 | Temp 98.9°F | Resp 18 | Ht 68.0 in | Wt 191.0 lb

## 2017-02-01 DIAGNOSIS — T82838A Hemorrhage of vascular prosthetic devices, implants and grafts, initial encounter: Secondary | ICD-10-CM

## 2017-02-01 DIAGNOSIS — N186 End stage renal disease: Secondary | ICD-10-CM | POA: Diagnosis present

## 2017-02-01 DIAGNOSIS — E1043 Type 1 diabetes mellitus with diabetic autonomic (poly)neuropathy: Secondary | ICD-10-CM | POA: Diagnosis present

## 2017-02-01 DIAGNOSIS — Z7901 Long term (current) use of anticoagulants: Secondary | ICD-10-CM

## 2017-02-01 DIAGNOSIS — E1040 Type 1 diabetes mellitus with diabetic neuropathy, unspecified: Secondary | ICD-10-CM | POA: Diagnosis present

## 2017-02-01 DIAGNOSIS — D631 Anemia in chronic kidney disease: Secondary | ICD-10-CM | POA: Diagnosis present

## 2017-02-01 DIAGNOSIS — N2581 Secondary hyperparathyroidism of renal origin: Secondary | ICD-10-CM | POA: Diagnosis present

## 2017-02-01 DIAGNOSIS — E10319 Type 1 diabetes mellitus with unspecified diabetic retinopathy without macular edema: Secondary | ICD-10-CM | POA: Diagnosis present

## 2017-02-01 DIAGNOSIS — Y841 Kidney dialysis as the cause of abnormal reaction of the patient, or of later complication, without mention of misadventure at the time of the procedure: Secondary | ICD-10-CM | POA: Diagnosis present

## 2017-02-01 DIAGNOSIS — Z86718 Personal history of other venous thrombosis and embolism: Secondary | ICD-10-CM | POA: Diagnosis not present

## 2017-02-01 DIAGNOSIS — Z833 Family history of diabetes mellitus: Secondary | ICD-10-CM

## 2017-02-01 DIAGNOSIS — Z9889 Other specified postprocedural states: Secondary | ICD-10-CM

## 2017-02-01 DIAGNOSIS — I120 Hypertensive chronic kidney disease with stage 5 chronic kidney disease or end stage renal disease: Secondary | ICD-10-CM | POA: Diagnosis present

## 2017-02-01 DIAGNOSIS — E8889 Other specified metabolic disorders: Secondary | ICD-10-CM | POA: Diagnosis present

## 2017-02-01 DIAGNOSIS — Z992 Dependence on renal dialysis: Secondary | ICD-10-CM

## 2017-02-01 DIAGNOSIS — E1022 Type 1 diabetes mellitus with diabetic chronic kidney disease: Secondary | ICD-10-CM | POA: Diagnosis present

## 2017-02-01 DIAGNOSIS — K3184 Gastroparesis: Secondary | ICD-10-CM | POA: Diagnosis present

## 2017-02-01 DIAGNOSIS — Z79899 Other long term (current) drug therapy: Secondary | ICD-10-CM

## 2017-02-01 DIAGNOSIS — G500 Trigeminal neuralgia: Secondary | ICD-10-CM | POA: Diagnosis present

## 2017-02-01 DIAGNOSIS — Z794 Long term (current) use of insulin: Secondary | ICD-10-CM

## 2017-02-01 DIAGNOSIS — Z95828 Presence of other vascular implants and grafts: Secondary | ICD-10-CM

## 2017-02-01 HISTORY — DX: Type 2 diabetes mellitus with unspecified diabetic retinopathy without macular edema: E11.319

## 2017-02-01 HISTORY — DX: Dependence on renal dialysis: Z99.2

## 2017-02-01 HISTORY — DX: Anemia, unspecified: D64.9

## 2017-02-01 HISTORY — DX: Type 1 diabetes mellitus without complications: E10.9

## 2017-02-01 HISTORY — DX: End stage renal disease: N18.6

## 2017-02-01 LAB — GLUCOSE, CAPILLARY
GLUCOSE-CAPILLARY: 68 mg/dL (ref 65–99)
GLUCOSE-CAPILLARY: 95 mg/dL (ref 65–99)
Glucose-Capillary: 253 mg/dL — ABNORMAL HIGH (ref 65–99)

## 2017-02-01 MED ORDER — VANCOMYCIN HCL 10 G IV SOLR
2000.0000 mg | Freq: Once | INTRAVENOUS | Status: DC
  Filled 2017-02-01: qty 2000

## 2017-02-01 MED ORDER — HEPARIN SODIUM (PORCINE) 5000 UNIT/ML IJ SOLN
5000.0000 [IU] | Freq: Three times a day (TID) | INTRAMUSCULAR | Status: DC
  Administered 2017-02-01 – 2017-02-04 (×7): 5000 [IU] via SUBCUTANEOUS
  Filled 2017-02-01 (×8): qty 1

## 2017-02-01 MED ORDER — POLYETHYLENE GLYCOL 3350 17 G PO PACK: 17.0000 g | PACK | Freq: Every day | ORAL | Status: DC | PRN

## 2017-02-01 MED ORDER — PHENOL 1.4 % MT LIQD: 1.0000 | OROMUCOSAL | Status: DC | PRN

## 2017-02-01 MED ORDER — LIDOCAINE-PRILOCAINE 2.5-2.5 % EX CREA: 1.0000 "application " | TOPICAL_CREAM | CUTANEOUS | Status: DC | PRN

## 2017-02-01 MED ORDER — ALTEPLASE 2 MG IJ SOLR: 2.0000 mg | Freq: Once | INTRAMUSCULAR | Status: DC | PRN

## 2017-02-01 MED ORDER — DEXTROSE 5 % IV SOLN: 2.0000 g | INTRAVENOUS | Status: DC

## 2017-02-01 MED ORDER — POTASSIUM CHLORIDE CRYS ER 20 MEQ PO TBCR: 20.0000 meq | EXTENDED_RELEASE_TABLET | Freq: Once | ORAL | Status: DC

## 2017-02-01 MED ORDER — SODIUM CHLORIDE 0.9 % IV SOLN: 100.0000 mL | INTRAVENOUS | Status: DC | PRN

## 2017-02-01 MED ORDER — HYDRALAZINE HCL 20 MG/ML IJ SOLN: 5.0000 mg | INTRAMUSCULAR | Status: DC | PRN

## 2017-02-01 MED ORDER — SODIUM CHLORIDE 0.9 % IV SOLN: 250.0000 mL | INTRAVENOUS | Status: DC | PRN

## 2017-02-01 MED ORDER — DEXTROSE 5 % IV SOLN
1.0000 g | Freq: Once | INTRAVENOUS | Status: AC
  Administered 2017-02-01: 1 g via INTRAVENOUS
  Filled 2017-02-01: qty 1

## 2017-02-01 MED ORDER — MORPHINE SULFATE (PF) 2 MG/ML IV SOLN: 2.0000 mg | INTRAVENOUS | Status: DC | PRN

## 2017-02-01 MED ORDER — LIDOCAINE HCL (PF) 1 % IJ SOLN: 5.0000 mL | INTRAMUSCULAR | Status: DC | PRN

## 2017-02-01 MED ORDER — ACETAMINOPHEN 650 MG RE SUPP: 325.0000 mg | Freq: Four times a day (QID) | RECTAL | Status: DC | PRN

## 2017-02-01 MED ORDER — HEPARIN SODIUM (PORCINE) 1000 UNIT/ML DIALYSIS: 1000.0000 [IU] | INTRAMUSCULAR | Status: DC | PRN

## 2017-02-01 MED ORDER — OXYCODONE HCL 5 MG PO TABS
5.0000 mg | ORAL_TABLET | Freq: Four times a day (QID) | ORAL | Status: DC | PRN
  Administered 2017-02-01: 5 mg via ORAL
  Filled 2017-02-01: qty 1

## 2017-02-01 MED ORDER — ONDANSETRON HCL 4 MG PO TABS
4.0000 mg | ORAL_TABLET | Freq: Four times a day (QID) | ORAL | Status: DC | PRN
  Administered 2017-02-01 – 2017-02-05 (×5): 4 mg via ORAL
  Filled 2017-02-01 (×5): qty 1

## 2017-02-01 MED ORDER — VANCOMYCIN HCL IN DEXTROSE 1-5 GM/200ML-% IV SOLN
1000.0000 mg | INTRAVENOUS | Status: DC
  Filled 2017-02-01: qty 200

## 2017-02-01 MED ORDER — PANTOPRAZOLE SODIUM 40 MG PO TBEC
40.0000 mg | DELAYED_RELEASE_TABLET | Freq: Every day | ORAL | Status: DC
  Administered 2017-02-01 – 2017-02-04 (×4): 40 mg via ORAL
  Filled 2017-02-01 (×4): qty 1

## 2017-02-01 MED ORDER — PENTAFLUOROPROP-TETRAFLUOROETH EX AERO: 1.0000 "application " | INHALATION_SPRAY | CUTANEOUS | Status: DC | PRN

## 2017-02-01 MED ORDER — BISACODYL 10 MG RE SUPP: 10.0000 mg | Freq: Every day | RECTAL | Status: DC | PRN

## 2017-02-01 MED ORDER — DEXTROSE 5 % IV SOLN
1.0000 g | INTRAVENOUS | Status: DC
  Filled 2017-02-01: qty 1

## 2017-02-01 MED ORDER — LABETALOL HCL 5 MG/ML IV SOLN: 10.0000 mg | INTRAVENOUS | Status: DC | PRN

## 2017-02-01 MED ORDER — ACETAMINOPHEN 325 MG PO TABS
325.0000 mg | ORAL_TABLET | Freq: Four times a day (QID) | ORAL | Status: DC | PRN
  Administered 2017-02-01: 325 mg via ORAL
  Filled 2017-02-01: qty 1

## 2017-02-01 MED ORDER — DIPHENHYDRAMINE HCL 25 MG PO CAPS
25.0000 mg | ORAL_CAPSULE | Freq: Once | ORAL | Status: AC
  Administered 2017-02-01: 25 mg via ORAL
  Filled 2017-02-01: qty 1

## 2017-02-01 MED ORDER — HEPARIN SODIUM (PORCINE) 1000 UNIT/ML DIALYSIS: 20.0000 [IU]/kg | INTRAMUSCULAR | Status: DC | PRN

## 2017-02-01 MED ORDER — PIPERACILLIN-TAZOBACTAM 3.375 G IVPB
3.3750 g | Freq: Two times a day (BID) | INTRAVENOUS | Status: DC
  Filled 2017-02-01 (×2): qty 50

## 2017-02-01 MED ORDER — INSULIN ASPART 100 UNIT/ML ~~LOC~~ SOLN
0.0000 [IU] | Freq: Three times a day (TID) | SUBCUTANEOUS | Status: DC
  Administered 2017-02-01: 8 [IU] via SUBCUTANEOUS
  Administered 2017-02-02 (×2): 3 [IU] via SUBCUTANEOUS
  Administered 2017-02-02: 2 [IU] via SUBCUTANEOUS
  Administered 2017-02-03 – 2017-02-05 (×5): 8 [IU] via SUBCUTANEOUS

## 2017-02-01 MED ORDER — GUAIFENESIN-DM 100-10 MG/5ML PO SYRP: 15.0000 mL | ORAL_SOLUTION | ORAL | Status: DC | PRN

## 2017-02-01 MED ORDER — SODIUM CHLORIDE 0.9% FLUSH
3.0000 mL | INTRAVENOUS | Status: DC | PRN
  Administered 2017-02-04: 3 mL via INTRAVENOUS
  Filled 2017-02-01: qty 3

## 2017-02-01 MED ORDER — ONDANSETRON HCL 4 MG/2ML IJ SOLN
4.0000 mg | Freq: Four times a day (QID) | INTRAMUSCULAR | Status: DC | PRN
  Administered 2017-02-02 – 2017-02-03 (×2): 4 mg via INTRAVENOUS
  Filled 2017-02-01 (×2): qty 2

## 2017-02-01 MED ORDER — METOPROLOL TARTRATE 5 MG/5ML IV SOLN: 2.0000 mg | INTRAVENOUS | Status: DC | PRN

## 2017-02-01 MED ORDER — SODIUM CHLORIDE 0.9% FLUSH
3.0000 mL | Freq: Two times a day (BID) | INTRAVENOUS | Status: DC
  Administered 2017-02-02 – 2017-02-04 (×3): 3 mL via INTRAVENOUS

## 2017-02-01 MED ORDER — GABAPENTIN 300 MG PO CAPS
300.0000 mg | ORAL_CAPSULE | Freq: Three times a day (TID) | ORAL | Status: DC
  Administered 2017-02-01 – 2017-02-04 (×10): 300 mg via ORAL
  Filled 2017-02-01 (×10): qty 1

## 2017-02-01 MED ORDER — GABAPENTIN 600 MG PO TABS: 300.0000 mg | ORAL_TABLET | Freq: Three times a day (TID) | ORAL | Status: DC

## 2017-02-01 NOTE — Progress Notes (Signed)
Dr Joelyn Oms paged RE inability of IV Team to gain vascular access.

## 2017-02-01 NOTE — Progress Notes (Addendum)
Pharmacy Antibiotic Note  LYNDSI ALTIC is a 36 y.o. female admitted on 02/01/2017 with wound infection associated with new graft placement.  Pharmacy has been consulted for Vancomycin / Ceftazidime Dosing.  ESRD - MWF HD Recent access revision - left AV graft (01/15/17), now admitted with persistent pain at the graft site. 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    Allergies  Allergen Reactions  . No Known Allergies      Thank you for allowing pharmacy to be a part of this patient's care. Anette Guarneri, PharmD [phone]  02/01/2017 3:57 PM

## 2017-02-01 NOTE — Progress Notes (Signed)
Called by floor RN.  Unable to obtain PIV. Pt can not rec PICC.   I know pt well from outpt HD Admitted for ABX for possible LUE AVG infection; has been Rx Vanc/Zosyn. Plan for access [facility] tonight with HD nurse to give Vanc.  Discussed with Dr. Bridgett Larsson and will change Zosyn to ceftazidime with pharmacy consult.  This will facilitate giving ABX with HD.  Formal consult note to come tomorrow.

## 2017-02-01 NOTE — Progress Notes (Signed)
    Postoperative Access Visit   History of Present Illness  Carrie [name] is a 36 y.o. year old female who is s/p Left Forearm AV graft placement on 01-15-17 by Dr. Oneida Alar.   She was evaluated by Dr. Oneida Alar on 01-25-17. At that time she still had significant swelling in the forearm and hand with pain around the graft. She denied any numbness or tingling in her hand at that point. Overall her symptoms had improved over the previous few days but she was still very uncomfortable. She was unable to do minimal activities with her left arm due to soreness. She had been working on stretching exercises to prevent her from getting elbow contracture. She currently is dialyzing via a right-sided catheter.  She is on Coumadin due to accumulated thrombus on her catheter. Healing antecubital and upper arm and distal forearm incisions. Mild erythema over the tunneled graft site. Audible bruit and graft palpable left radial pulse hand pink and warm Slowly improving arm swelling left upper extremity most likely Gore-Tex reaction. The patient was to follow-up with Dr. Oneida Alar in 2 weeks to see if her symptoms had continued to improve. Possible central vein stenosis but less likely since she did not have any central vein symptoms prior to her graft placement with an existing fistula. We would hold on cannulating the graft until her arm pain symptoms have improved significantly as well as the swelling. Percocet #25 dispensed that day no further refills.   She then presented to [facility] ED on 01-28-17 for bleeding from AV graft, derma bond applied, Dr. Donzetta Matters consulted by the ED provider, suggested office follow up.   She returns today for follow up of bleeding during the night from AV graft.   She dialyzes M-W-F via right IJ catheter at [facility].  She is right hand dominant.   The patient's wounds are healed.  The patient notes no steal symptoms.  The patient is able to complete their activities of daily  living.     For VQI Use Only  PRE-ADM LIVING: Home  AMB STATUS: Ambulatory  Physical Examination Vitals:   02/01/17 1242 02/01/17 1244  BP: (!) 141/78 140/79  Pulse: 94   Resp: 18   Temp: 98.9 F (37.2 C)   TempSrc: Oral   SpO2: 97%   Weight: 191 lb (86.6 kg)   Height: 5\' 8"  (1.727 m)    Body mass index is 29.04 kg/m.   Bilateral radial pulses are 2+ palpable. Left antecubital incision with minimal swelling, mild dark discoloration, left forearm with 1+ non pitting edema.  Left arm feels warmer than right arm, mild erythema.  Bilateral hand grip is 5/5, sensation in digits is intact, palpable thrill, bruit can be auscultated.  Medical Decision Making  Carrie [name] is a 36 y.o. year old female who presents s/p Left Forearm AV graft placement on 01-15-17.  Her left antecubital area and forearm at and adjacent to the graft are mildly erythematous and with moderate amount of swelling, no active bleeding.  Dr. Oneida Alar spoke with and examined pt, he probed antecubital incision with sterile instruments, no bleeding.  Admit to [facility] today, to Dr. Oneida Alar, with IV antibx, Vancomycin and Zosyn, managed by pharmacy. Dx: bleeding from left arm AV graft, pt on coumadin.   NICKEL, Sharmon Leyden, RN, MSN, FNP-C [facility] [place] Office: [phone]  02/01/2017, 12:52 PM  Clinic MD: Oneida Alar

## 2017-02-01 NOTE — Progress Notes (Signed)
Gabe RN made aware that 2 IV PICC nurses assessed and was unsuccessful with starting a PIV. Please consider other access. Catalina Pizza

## 2017-02-02 ENCOUNTER — Encounter (HOSPITAL_COMMUNITY): Payer: Self-pay | Admitting: *Deleted

## 2017-02-02 LAB — GLUCOSE, CAPILLARY
GLUCOSE-CAPILLARY: 193 mg/dL — AB (ref 65–99)
Glucose-Capillary: 128 mg/dL — ABNORMAL HIGH (ref 65–99)
Glucose-Capillary: 173 mg/dL — ABNORMAL HIGH (ref 65–99)
Glucose-Capillary: 188 mg/dL — ABNORMAL HIGH (ref 65–99)

## 2017-02-02 LAB — RENAL FUNCTION PANEL
ALBUMIN: 2.9 g/dL — AB (ref 3.5–5.0)
Anion gap: 13 (ref 5–15)
BUN: 38 mg/dL — ABNORMAL HIGH (ref 6–20)
CO2: 29 mmol/L (ref 22–32)
CREATININE: 8.95 mg/dL — AB (ref 0.44–1.00)
Calcium: 8.7 mg/dL — ABNORMAL LOW (ref 8.9–10.3)
Chloride: 90 mmol/L — ABNORMAL LOW (ref 101–111)
GFR, EST AFRICAN AMERICAN: 6 mL/min — AB (ref >60)
GFR, EST NON AFRICAN AMERICAN: 5 mL/min — AB (ref >60)
Glucose, Bld: 208 mg/dL — ABNORMAL HIGH (ref 65–99)
PHOSPHORUS: 5.2 mg/dL — AB (ref 2.5–4.6)
Potassium: 3.6 mmol/L (ref 3.5–5.1)
Sodium: 132 mmol/L — ABNORMAL LOW (ref 135–145)

## 2017-02-02 LAB — PROTIME-INR
INR: 1.23
Prothrombin Time: 15.4 seconds — ABNORMAL HIGH (ref 11.4–15.2)

## 2017-02-02 LAB — CBC
HCT: 29.6 % — ABNORMAL LOW (ref 36.0–46.0)
HEMOGLOBIN: 9.4 g/dL — AB (ref 12.0–15.0)
MCH: 30.4 pg (ref 26.0–34.0)
MCHC: 31.8 g/dL (ref 30.0–36.0)
MCV: 95.8 fL (ref 78.0–100.0)
PLATELETS: 315 10*3/uL (ref 150–400)
RBC: 3.09 MIL/uL — AB (ref 3.87–5.11)
RDW: 17.7 % — ABNORMAL HIGH (ref 11.5–15.5)
WBC: 6.5 10*3/uL (ref 4.0–10.5)

## 2017-02-02 MED ORDER — GABAPENTIN 300 MG PO CAPS: 300.0000 mg | ORAL_CAPSULE | Freq: Three times a day (TID) | ORAL | Status: DC

## 2017-02-02 MED ORDER — SODIUM CHLORIDE 0.9 % IV SOLN: 100.0000 mL | INTRAVENOUS | Status: DC | PRN

## 2017-02-02 MED ORDER — INSULIN GLARGINE 100 UNIT/ML ~~LOC~~ SOLN
30.0000 [IU] | Freq: Every day | SUBCUTANEOUS | Status: DC
  Administered 2017-02-03 – 2017-02-05 (×3): 30 [IU] via SUBCUTANEOUS
  Filled 2017-02-02 (×3): qty 0.3

## 2017-02-02 MED ORDER — HEPARIN SODIUM (PORCINE) 1000 UNIT/ML DIALYSIS: 1000.0000 [IU] | INTRAMUSCULAR | Status: DC | PRN

## 2017-02-02 MED ORDER — ONDANSETRON HCL 4 MG PO TABS: 4.0000 mg | ORAL_TABLET | Freq: Three times a day (TID) | ORAL | Status: DC | PRN

## 2017-02-02 MED ORDER — ALTEPLASE 2 MG IJ SOLR: 2.0000 mg | Freq: Once | INTRAMUSCULAR | Status: DC | PRN

## 2017-02-02 MED ORDER — LIDOCAINE-PRILOCAINE 2.5-2.5 % EX CREA: 1.0000 "application " | TOPICAL_CREAM | CUTANEOUS | Status: DC | PRN

## 2017-02-02 MED ORDER — PROMETHAZINE HCL 25 MG PO TABS
25.0000 mg | ORAL_TABLET | Freq: Three times a day (TID) | ORAL | Status: DC | PRN
  Administered 2017-02-02 – 2017-02-04 (×5): 25 mg via ORAL
  Filled 2017-02-02 (×6): qty 1

## 2017-02-02 MED ORDER — PENTAFLUOROPROP-TETRAFLUOROETH EX AERO: 1.0000 "application " | INHALATION_SPRAY | CUTANEOUS | Status: DC | PRN

## 2017-02-02 MED ORDER — DIPHENHYDRAMINE-APAP (SLEEP) 25-500 MG PO TABS: 2.0000 | ORAL_TABLET | Freq: Every day | ORAL | Status: DC

## 2017-02-02 MED ORDER — DIPHENHYDRAMINE HCL 25 MG PO CAPS
50.0000 mg | ORAL_CAPSULE | Freq: Every evening | ORAL | Status: DC | PRN
  Administered 2017-02-02 – 2017-02-04 (×3): 50 mg via ORAL
  Filled 2017-02-02 (×3): qty 2

## 2017-02-02 MED ORDER — OXYCODONE-ACETAMINOPHEN 5-325 MG PO TABS
1.0000 | ORAL_TABLET | Freq: Four times a day (QID) | ORAL | Status: DC | PRN
  Administered 2017-02-02 – 2017-02-04 (×3): 1 via ORAL
  Filled 2017-02-02: qty 1
  Filled 2017-02-02: qty 2
  Filled 2017-02-02: qty 1

## 2017-02-02 MED ORDER — VANCOMYCIN HCL IN DEXTROSE 1-5 GM/200ML-% IV SOLN
1000.0000 mg | INTRAVENOUS | Status: DC
  Filled 2017-02-02: qty 200

## 2017-02-02 MED ORDER — WARFARIN SODIUM 7.5 MG PO TABS: 7.5000 mg | ORAL_TABLET | Freq: Once | ORAL | Status: DC

## 2017-02-02 MED ORDER — VANCOMYCIN HCL 1000 MG IV SOLR
2000.0000 mg | Freq: Once | INTRAVENOUS | Status: AC
  Administered 2017-02-02: 2000 mg via INTRAVENOUS
  Filled 2017-02-02 (×2): qty 2000

## 2017-02-02 MED ORDER — ACETAMINOPHEN 325 MG PO TABS: 650.0000 mg | ORAL_TABLET | Freq: Four times a day (QID) | ORAL | Status: DC | PRN

## 2017-02-02 MED ORDER — WARFARIN - PHARMACIST DOSING INPATIENT: Freq: Every day | Status: DC

## 2017-02-02 MED ORDER — LABETALOL HCL 300 MG PO TABS
300.0000 mg | ORAL_TABLET | ORAL | Status: DC
  Administered 2017-02-02: 300 mg via ORAL
  Filled 2017-02-02 (×3): qty 1

## 2017-02-02 MED ORDER — WARFARIN - PHYSICIAN DOSING INPATIENT: Freq: Every day | Status: DC

## 2017-02-02 MED ORDER — DEXTROSE 5 % IV SOLN
2.0000 g | INTRAVENOUS | Status: DC
  Administered 2017-02-02: 2 g via INTRAVENOUS
  Filled 2017-02-02 (×2): qty 2

## 2017-02-02 MED ORDER — SEVELAMER CARBONATE 800 MG PO TABS
800.0000 mg | ORAL_TABLET | Freq: Three times a day (TID) | ORAL | Status: DC
  Administered 2017-02-02 – 2017-02-05 (×8): 800 mg via ORAL
  Filled 2017-02-02 (×8): qty 1

## 2017-02-02 MED ORDER — LIDOCAINE HCL (PF) 1 % IJ SOLN: 5.0000 mL | INTRAMUSCULAR | Status: DC | PRN

## 2017-02-02 MED ORDER — CARBAMAZEPINE ER 200 MG PO TB12
200.0000 mg | ORAL_TABLET | Freq: Every day | ORAL | Status: DC
  Administered 2017-02-02 – 2017-02-04 (×3): 200 mg via ORAL
  Filled 2017-02-02 (×4): qty 1

## 2017-02-02 MED ORDER — INSULIN GLARGINE 100 UNIT/ML SOLOSTAR PEN: 30.0000 [IU] | PEN_INJECTOR | Freq: Every day | SUBCUTANEOUS | Status: DC

## 2017-02-02 MED ORDER — METOCLOPRAMIDE HCL 5 MG PO TABS
5.0000 mg | ORAL_TABLET | Freq: Four times a day (QID) | ORAL | Status: DC | PRN
Start: 2017-02-02 — End: 2017-02-05
  Administered 2017-02-02 – 2017-02-05 (×5): 5 mg via ORAL
  Filled 2017-02-02 (×6): qty 1

## 2017-02-02 MED ORDER — LABETALOL HCL 300 MG PO TABS
300.0000 mg | ORAL_TABLET | ORAL | Status: DC
  Administered 2017-02-03 – 2017-02-04 (×4): 300 mg via ORAL
  Filled 2017-02-02 (×2): qty 1

## 2017-02-02 NOTE — Progress Notes (Addendum)
[facility] [place]  Subjective  - doing OK this am.   Objective (!) 148/90 82 98 F (36.7 C) 17 100%  Intake/Output Summary (Last 24 hours) at 02/02/17 0941 Last data filed at 02/02/17 0641  Gross per 24 hour  Intake              420 ml  Output                0 ml  Net              420 ml    Left fore arm edema, with incisional bleeding, now stopped.  Dry dressing applied to anti cubital incision Palpable radial pulse  Assessment/Planning: Left Forearm AV graft 01/15/2017  Elevation and hand range of motion encouraged Continue antibiotics over the weekend    Laurence Slate [facility] 02/02/2017 9:41 AM -- Agree with above.  Will continue IV antibiotics over the weekend with intent to treat for full 7 days.  If no further bleeding and no signs of infection will most likely d/c home Monday.  She is on coumadin for catheter thrombus.  Will continue this for now.  Ruta Hinds, MD [facility] [place] Office: [phone] Pager: [phone]  Laboratory Lab Results: No results for input(s): WBC, HGB, HCT, PLT in the last 72 hours. BMET No results for input(s): NA, K, CL, CO2, GLUCOSE, BUN, CREATININE, CALCIUM in the last 72 hours.  COAG Lab Results  Component Value Date   INR 2.45 01/24/2017   INR 2.03 01/23/2017   INR 2.09 01/22/2017   No results found for: PTT

## 2017-02-02 NOTE — Progress Notes (Signed)
ANTICOAGULATION CONSULT NOTE - Initial Consult  Pharmacy Consult for warfarin  Indication:  catheter thrombus.  Allergies  Allergen Reactions  . No Known Allergies     Patient Measurements: Height: 5\' 8"  (172.7 cm) Weight: 189 lb (85.7 kg) IBW/kg (Calculated) : 63.9  Vital Signs: Temp: 98.9 F (37.2 C) (10/19 1035) Temp Source: Oral (10/19 1035) BP: 125/75 (10/19 1035) Pulse Rate: 93 (10/19 1035)  Labs: No results for input(s): HGB, HCT, PLT, APTT, LABPROT, INR, HEPARINUNFRC, HEPRLOWMOCWT, CREATININE, CKTOTAL, CKMB, TROPONINI in the last 72 hours.  Estimated Creatinine Clearance: 11.3 mL/min (A) (by C-G formula based on SCr of 8.04 mg/dL (H)).   Medical History: Past Medical History:  Diagnosis Date  . Anemia   . Diabetic neuropathy ([place])   . Diabetic retinopathy ([place])   . ESRD (end stage renal disease) on dialysis [facility])    "MWF; [place]" (02/02/2017)  . Gastroparesis   . HA (headache)    "qd if I didn't take my RX" (02/02/2017)  . Hemodialysis access site with arteriovenous graft ([place])   . Hypertension   . Neuropathy   . Trigeminal neuralgia   . Type I diabetes mellitus (HCC)     Medications:  Prescriptions Prior to Admission  Medication Sig Dispense Refill Last Dose  . acetaminophen (TYLENOL) 325 MG tablet Take 650 mg by mouth every 6 (six) hours as needed for mild pain.   Past Month at Unknown time  . carbamazepine (TEGRETOL) 200 MG tablet Take 200 tablets by mouth daily.  1 01/31/2017 at Unknown time  . diclofenac sodium (VOLTAREN) 1 % GEL Apply 2 g topically 4 (four) times daily. 1 Tube 0 Past Week at Unknown time  . diphenhydramine-acetaminophen (TYLENOL PM) 25-500 MG TABS tablet Take 2 tablets by mouth at bedtime.   Past Week at Unknown time  . gabapentin (NEURONTIN) 300 MG capsule Take 1 capsule (300 mg total) by mouth at bedtime. (Patient taking differently: Take 300 mg by mouth 3 (three) times daily. )   01/31/2017 at Unknown time  . labetalol (NORMODYNE)  300 MG tablet Take 300 mg by mouth See admin instructions. Take 300mg  tablet at bedtime on diaylsis days M, W, F. Take 300mg  tablet twice daily on all other days  0 01/31/2017 at Unknown time  . LANTUS SOLOSTAR 100 UNIT/ML Solostar Pen Inject 30 Units into the skin daily.  99 02/01/2017 at Unknown time  . metoCLOPramide (REGLAN) 5 MG tablet Take 5 mg by mouth every 6 (six) hours as needed (nausea).    02/01/2017 at Unknown time  . ondansetron (ZOFRAN) 4 MG tablet Take 1 tablet (4 mg total) by mouth every 8 (eight) hours as needed for nausea or vomiting. 20 tablet 0 02/01/2017 at Unknown time  . oxyCODONE-acetaminophen (PERCOCET/ROXICET) 5-325 MG tablet Take 1-2 tablets by mouth every 6 (six) hours as needed for severe pain. 8 tablet 0 01/31/2017 at Unknown time  . promethazine (PHENERGAN) 25 MG tablet Take 1 tablet (25 mg total) by mouth every 8 (eight) hours as needed for nausea or vomiting. 10 tablet 0 01/31/2017 at Unknown time  . sevelamer carbonate (RENVELA) 800 MG tablet Take 800 mg by mouth 3 (three) times daily with meals.   02/01/2017 at Unknown time  . warfarin (COUMADIN) 5 MG tablet Take 1.5 tablets (7.5 mg total) by mouth one time only at 6 PM. 90 tablet 0 01/31/2017 at 2000  . carbamazepine (TEGRETOL XR) 400 MG 12 hr tablet Take 200 mg by mouth once.   Not  Taking at Unknown time  . diphenhydrAMINE (BENADRYL) 25 MG tablet Take 50 mg by mouth at bedtime as needed for sleep.   01/30/2017 at prn  . Insulin NPH, Human,, Isophane, (HUMULIN N KWIKPEN) 100 UNIT/ML Kiwkpen Inject 30 Units into the skin every morning. And pen needles 1/day (Patient not taking: Reported on 02/01/2017) 15 mL 11 Not Taking at Unknown time  . oxyCODONE-acetaminophen (PERCOCET/ROXICET) 5-325 MG tablet Take 1 tablet by mouth every 8 (eight) hours as needed for severe pain. (Patient not taking: Reported on 02/01/2017) 25 tablet 0 Not Taking at Unknown time   Scheduled:  . carbamazepine  200 mg Oral Daily  . gabapentin   300 mg Oral TID  . heparin  5,000 Units Subcutaneous Q8H  . insulin aspart  0-15 Units Subcutaneous TID WC  . [START ON 02/03/2017] insulin glargine  30 Units Subcutaneous Q breakfast  . labetalol  300 mg Oral Once per day on Mon Wed Fri  . [START ON 02/03/2017] labetalol  300 mg Oral 2 times per day on Sun Tue Thu Sat  . pantoprazole  40 mg Oral Daily  . potassium chloride  20-40 mEq Oral Once  . sevelamer carbonate  800 mg Oral TID WC  . sodium chloride flush  3 mL Intravenous Q12H  . warfarin  7.5 mg Oral ONCE-1800    Assessment: 36 y.o female on warfarin PTA for catheter thrombus (atrial mass thrombus, TEE 01/11/17 =  No left atrial clot.).  Initially warfarin began on 01/16/17 previous admission (01/09/17-01/24/17). INR on discharge date 01/24/17 was 2.45.  She is  s/p Left Forearm AV graft placement on 01-15-17 by Dr. Oneida Alar.  This admission 02/01/17 Warfarin was held due to bleeding from dialysis shunt.  She also presented to [facility] ED on 01-28-17 for bleeding from AV graft.   Today, VVS notes Left fore arm edema, with incisional bleeding now stopped.  Pharmacy consulted today 02/02/17  to resume warfarin for catheter thrombus.  VVS notes that if no further bleeding and no signs of infection will most likely d/c home Monday.  Awaiting INR to be drawn. Patient is a difficult stick.  No IV access. Unable to get IV access for IV antibiotics.   PTA warfarin dose : 7.5 mg daily, last taken pta 01/31/17 @ 8pm  Goal of Therapy:  INR 2-3 Monitor platelets by anticoagulation protocol: Yes   Plan:  Draw INR in Hemodialysis today.  RN reports pt to go to HD late today/tonight. Will f/u INR result today/tonight then dose warfarin.  Daily INR  Nicole Cella, RPh Clinical Pharmacist Pager: [phone] 8a-330p [phone] 330p-1030p phone [phone] or [phone] Main pharmacy [phone] 02/02/2017,3:22 PM

## 2017-02-02 NOTE — Progress Notes (Signed)
ANTICOAGULATION CONSULT NOTE - Follow Up Consult  Pharmacy Consult for warfarin Indication: catheter thrombus  Allergies  Allergen Reactions  . No Known Allergies     Patient Measurements: Height: 5\' 8"  (172.7 cm) Weight: 193 lb 12.6 oz (87.9 kg) IBW/kg (Calculated) : 63.9  Vital Signs: Temp: 98.4 F (36.9 C) (10/19 1827) Temp Source: Oral (10/19 1827) BP: 137/82 (10/19 2000) Pulse Rate: 93 (10/19 2000)  Labs:  Recent Labs  02/02/17 1854 02/02/17 1938  HGB 9.4*  --   HCT 29.6*  --   PLT 315  --   LABPROT  --  15.4*  INR  --  1.23  CREATININE 8.95*  --     Estimated Creatinine Clearance: 10.3 mL/min (A) (by C-G formula based on SCr of 8.95 mg/dL (H)).  Assessment: 36 year old female on warfarin PTA to resume inpatient per pharmacy consult.   INR obtained prior to HD = 1.23. OK to restart warfarin.  Last dose 01/31/17 at 2000PM. PTA regimen 7.5mg  daily.   Goal of Therapy:  INR 2-3 Monitor platelets by anticoagulation protocol: Yes   Plan:  Warfarin 7.5mg  po x1 tonight. Daily PT/INR  Sloan Leiter, PharmD, BCPS Clinical Pharmacist Clinical phone 02/02/2017 until 11PM [phone] After hours, please call #[phone] 02/02/2017,8:27 PM

## 2017-02-02 NOTE — Consult Note (Signed)
[facility] Renal Consultation Note  Indication for Consultation:  Management of ESRD/hemodialysis; anemia, hypertension/volume and secondary hyperparathyroidism  HPI: Carrie [name] is a 36 y.o. female with  ESRD 2/2  DM, HD  [facility] MWF, Hd start 08/2016 with PMHx  type 1 DM, complicated by neuropathy and severe gastroparesis, and trigeminal neuralgia.  Patient has been mostly compliant with  OP HD/  noted recent  placement of a left forearm AV graft on 01/15/2017 Dr Oneida Alar  and  R Atrial  Thrombus found on TEE on 01/12/17  mass (2.8cmx1.7cm) started on Coumadin . Seen Yesterday  Dr. Leonides Grills  For Postop 01/15/17 L FA AVG  Placement and noted swelling and  antecubital incision some bleeding and he admitted  Her with Dx: bleeding from left arm AV graft, pt on coumadin. With plans for IV antibx, Vancomycin and Zosyn. Noted peripheral iv site not obtained so Tressie Ellis will be given on hd (Zosyn dc). Last INR 2.67 om 01/29/17 .Currently no complaints ,Has been complaint with op HD using Perm cath last HD 10/17 seen by Dr. Joelyn Oms noted AVF swollen and VVS apt 02/01/17.     Past Medical History:  Diagnosis Date  . Diabetes mellitus with complication ([place])   . Diabetic neuropathy ([place])   . Gastroparesis   . HA (headache)   . Hemodialysis access site with arteriovenous graft ([place])   . Hypertension   . Neuropathy   . Renal disorder   . Trigeminal neuralgia     Past Surgical History:  Procedure Laterality Date  . AV FISTULA PLACEMENT Left 09/07/2016   Procedure: Left arm 1st Stage Basilic AV Fistula;  Surgeon: [name], MD;  Location: [facility] OR;  Service: Vascular;  Laterality: Left;  . EYE SURGERY    . INSERTION OF DIALYSIS CATHETER Right 09/07/2016   Procedure: INSERTION OF DIALYSIS CATHETER;  Surgeon: [name], MD;  Location: [place];  Service: Vascular;  Laterality: Right;  . None    . REVISON OF ARTERIOVENOUS FISTULA Left 01/15/2017   Procedure: INSERTION OF  AV  GORE-TEX GRAFT;  Surgeon: Elam Dutch, MD;  Location: [place];  Service: Vascular;  Laterality: Left;  . TEE WITHOUT CARDIOVERSION N/A 01/11/2017   Procedure: TRANSESOPHAGEAL ECHOCARDIOGRAM (TEE);  Surgeon: Jerline Pain, MD;  Location: [facility] ENDOSCOPY;  Service: Cardiovascular;  Laterality: N/A;      Family History  Problem Relation Age of Onset  . Hyperlipidemia Mother   . Asthma Daughter   . Diabetes Maternal Grandmother   . Stroke Maternal Grandmother   . Kidney disease Maternal Grandfather   . Kidney disease Paternal Grandmother       reports that she has never smoked. She has never used smokeless tobacco. She reports that she does not drink alcohol or use drugs.   Allergies  Allergen Reactions  . No Known Allergies     Prior to Admission medications   Medication Sig Start Date End Date Taking? Authorizing Provider  acetaminophen (TYLENOL) 325 MG tablet Take 650 mg by mouth every 6 (six) hours as needed for mild pain.   Yes [provider]  carbamazepine (TEGRETOL) 200 MG tablet Take 200 tablets by mouth daily. 01/03/17  Yes [provider]  diclofenac sodium (VOLTAREN) 1 % GEL Apply 2 g topically 4 (four) times daily. 01/24/17  Yes Lacroce, Hulen Shouts, MD  diphenhydramine-acetaminophen (TYLENOL PM) 25-500 MG TABS tablet Take 2 tablets by mouth at bedtime.   Yes [provider]  gabapentin (NEURONTIN) 300 MG capsule Take 1 capsule (300 mg total) by mouth at bedtime. Patient taking differently: Take 300 mg by mouth 3 (three) times daily.  09/12/16  Yes Rosita Fire, MD  labetalol (NORMODYNE) 300 MG tablet Take 300 mg by mouth See admin instructions. Take 300mg  tablet at bedtime on diaylsis days M, W, F. Take 300mg  tablet twice daily on all other days 08/25/16  Yes [provider]  LANTUS SOLOSTAR 100 UNIT/ML Solostar Pen Inject 30 Units into the skin daily. 11/17/16  Yes [provider]  metoCLOPramide (REGLAN) 5 MG tablet Take 5  mg by mouth every 6 (six) hours as needed (nausea).    Yes [provider]  ondansetron (ZOFRAN) 4 MG tablet Take 1 tablet (4 mg total) by mouth every 8 (eight) hours as needed for nausea or vomiting. 09/12/16  Yes Rosita Fire, MD  oxyCODONE-acetaminophen (PERCOCET/ROXICET) 5-325 MG tablet Take 1-2 tablets by mouth every 6 (six) hours as needed for severe pain. 01/24/17  Yes Lacroce, Hulen Shouts, MD  promethazine (PHENERGAN) 25 MG tablet Take 1 tablet (25 mg total) by mouth every 8 (eight) hours as needed for nausea or vomiting. 09/12/16  Yes Rosita Fire, MD  sevelamer carbonate (RENVELA) 800 MG tablet Take 800 mg by mouth 3 (three) times daily with meals.   Yes [provider]  warfarin (COUMADIN) 5 MG tablet Take 1.5 tablets (7.5 mg total) by mouth one time only at 6 PM. 01/25/17 04/27/17 Yes Lacroce, Hulen Shouts, MD  carbamazepine (TEGRETOL XR) 400 MG 12 hr tablet Take 200 mg by mouth once.    [provider]  diphenhydrAMINE (BENADRYL) 25 MG tablet Take 50 mg by mouth at bedtime as needed for sleep.    [provider]  Insulin NPH, Human,, Isophane, (HUMULIN N KWIKPEN) 100 UNIT/ML Kiwkpen Inject 30 Units into the skin every morning. And pen needles 1/day Patient not taking: Reported on 02/01/2017 01/31/17   Renato Shin, MD  oxyCODONE-acetaminophen (PERCOCET/ROXICET) 5-325 MG tablet Take 1 tablet by mouth every 8 (eight) hours as needed for severe pain. Patient not taking: Reported on 02/01/2017 01/25/17   Elam Dutch, MD     Anti-infectives    Start     Dose/Rate Route Frequency Ordered Stop   02/05/17 1200  vancomycin (VANCOCIN) IVPB 1000 mg/200 mL premix     1,000 mg 200 mL/hr over 60 Minutes Intravenous Every M-W-F (Hemodialysis) 02/02/17 1043     02/02/17 2000  cefTAZidime (FORTAZ) 2 g in dextrose 5 % 50 mL IVPB  Status:  Discontinued     2 g 100 mL/hr over 30 Minutes Intravenous Every M-W-F (2000) 02/01/17 1939 02/02/17 1043    02/02/17 1400  vancomycin (VANCOCIN) 2,000 mg in sodium chloride 0.9 % 400 mL IVPB     2,000 mg 400 mL/hr over 60 Minutes Intravenous  Once 02/02/17 1049     02/02/17 1200  vancomycin (VANCOCIN) IVPB 1000 mg/200 mL premix  Status:  Discontinued     1,000 mg 200 mL/hr over 60 Minutes Intravenous Every M-W-F (Hemodialysis) 02/01/17 1610 02/02/17 1043   02/02/17 1200  cefTAZidime (FORTAZ) 2 g in dextrose 5 % 50 mL IVPB    Comments:  Tube to HEMODIALYSIS   2 g 100 mL/hr over 30 Minutes Intravenous Every M-W-F (Hemodialysis) 02/02/17 1043     02/01/17 2000  cefTAZidime (FORTAZ) 1 g in dextrose 5 % 50 mL IVPB  Status:  Discontinued     1 g 100  mL/hr over 30 Minutes Intravenous Every 24 hours 02/01/17 1937 02/01/17 1938   02/01/17 2000  cefTAZidime (FORTAZ) 1 g in dextrose 5 % 50 mL IVPB     1 g 100 mL/hr over 30 Minutes Intravenous  Once 02/01/17 1938 02/01/17 2355   02/01/17 1800  piperacillin-tazobactam (ZOSYN) IVPB 3.375 g  Status:  Discontinued     3.375 g 12.5 mL/hr over 240 Minutes Intravenous Every 12 hours 02/01/17 1610 02/01/17 1923   02/01/17 1615  vancomycin (VANCOCIN) 2,000 mg in sodium chloride 0.9 % 500 mL IVPB  Status:  Discontinued     2,000 mg 250 mL/hr over 120 Minutes Intravenous  Once 02/01/17 1610 02/02/17 1043     Results for orders placed or performed during the hospital encounter of 02/01/17 (from the past 48 hour(s))  Glucose, capillary     Status: Abnormal   Collection Time: 02/01/17  5:18 PM  Result Value Ref Range   Glucose-Capillary 253 (H) 65 - 99 mg/dL  Glucose, capillary     Status: None   Collection Time: 02/01/17  9:47 PM  Result Value Ref Range   Glucose-Capillary 68 65 - 99 mg/dL  Glucose, capillary     Status: None   Collection Time: 02/01/17 10:23 PM  Result Value Ref Range   Glucose-Capillary 95 65 - 99 mg/dL  Glucose, capillary     Status: Abnormal   Collection Time: 02/02/17  8:05 AM  Result Value Ref Range   Glucose-Capillary 128 (H) 65 - 99  mg/dL     ROS: only as listed in HPI   Physical Exam: Vitals:   02/01/17 2017 02/02/17 0438  BP: (!) 156/88 (!) 148/90  Pulse: 92 82  Resp: 15 17  Temp: 99.4 F (37.4 C) 98 F (36.7 C)  SpO2: 100% 100%     General: alert AAF , NAD , Up Ambulating in room ,Appropriate HEENT: [place] , MMM, Anicteric  Neck: supple, No jvd Heart: RRR, No mur, rub, gallop appreciated Lungs: CTA  Unlabored breathing  Abdomen: bs pos , soft, nt,nd Extremities: No pedal edema, L arm swelling  To fingers noted ,Left Antecubtal bandage dry/clean   Skin: warm dry , no overt rashi Neuro: alert moves all extrem  Dialysis Access: R IJ Perm cath / LUA AVF pos bruit  With  Antecubtal bandage dry/clean    Dialysis Orders: [facility] MWF 4h 84kg 2ca, 2k bath  Hep 2500 R IJ/ Perm cath  L arm AVG - Calcitriol 0.25 mcg PO TIW No  Heparin / Mircera 252mcg q 2wks (last given on 01/31/17).      Assessment/Plan 1. L  Forearm AVGG  (01/15/17 Insert)  Bleeding /swelling - Dr. Oneida Alar Rx  With 7 days Antibiotics on HD = Vanc / Tressie Ellis / Elevation arm  rx  Per Dr. Oneida Alar / 2. ESRD -  HD MWF/ Volume ok , Use no Heparin on HD / Next HD tomor Am sec. Numerous  more emergent caes today  3. Hypertension/volume  - Volume and bp ok  4. Anemia  -  ESA given Last HD 01/31/17  Fu hgb trend  5. Metabolic bone disease -   no Vit d on hd / Renvela 1 ac  6. DM type 2 -  Per admit    Ernest Haber, PA-C [place] [phone] 02/02/2017, 9:24 AM   Pt seen, examined and agree w A/P as above.  ESRD pt with recent AVG placement L forearm, complicated by probable allergic reaction to  Goretex w/ significant inflammation, now admitted with some wound bleeding and drainage in the antecub area.  Left forearm looks much better and does not appear to be inflamed anymore.  Per VVS plan is for 7 days abx for prob local wound infxn. Plan HD tomorrow off schedule .   [name] MD [employer] pager  [phone]   02/02/2017, 2:35 PM

## 2017-02-02 NOTE — Progress Notes (Signed)
Hypoglycemic Event  CBG: 68  Treatment: 15 GM carbohydrate snack  Symptoms: Shaky  Follow-up CBG: Time:2223 CBG Result:95  Possible Reasons for Event: Unknown  Comments/MD notified:per hypoglycemia protocol    Hyman Crossan Joselita

## 2017-02-02 NOTE — Progress Notes (Signed)
Inpatient Diabetes Program Recommendations  AACE/ADA: New Consensus Statement on Inpatient Glycemic Control (2015)  Target Ranges:  Prepandial:   less than 140 mg/dL      Peak postprandial:   less than 180 mg/dL (1-2 hours)      Critically ill patients:  140 - 180 mg/dL   Results for Carrie [name], Carrie [name] (MRN [id number]) as of 02/02/2017 09:00  Ref. Range 02/01/2017 17:18 02/01/2017 21:47 02/01/2017 22:23 02/02/2017 08:05  Glucose-Capillary Latest Ref Range: 65 - 99 mg/dL 253 (H)  Novolog 8 units 68 95 128 (H)   Review of Glycemic Control  Diabetes history: DM1 (makes no insulin; will require basal, meal coverage, and correction insulin) Outpatient Diabetes medications: Lantus 30 units daily Current orders for Inpatient glycemic control: Novolog 0-15 units TID with meals  Inpatient Diabetes Program Recommendations: Insulin - Basal: Patient reports that she took Lantus 30 units on 02/01/17 prior to coming to the hospital.  Please order Lantus 10 units daily. Correction (SSI): Please decrease Novolog correction to Sensitive scale (0-9 units TID with meals) and add Novolog 0-5 units QHS for bedtime correction. Insulin - Meal Coverage: Please consider ordering Novolog 4 units TID with meals for meal coverage if patient eats at least 50% of meals.  Patient was recently inpatient and was discharged on 01/24/17. Patient was seen by a Diabetes Coordinator on 01/18/17 and had been on NPH insulin and wanted to transition back to Lantus and Novolog to use as an outpatient for DM control. Patient has DM1 and is sensitive to insulin. Noted when patient was discharged on 01/24/17 she was prescribed Lantus and Novolog as she had requested. Spoke with patient over the phone and she reports that after she was discharged on 01/24/17 she went back to Dr. Loanne Drilling and he instructed her to only take Lantus 30 units daily. Patient admits that she does not want to take 4 shots a day for DM control. Discussed basic glucose  metabolism and explained danger of using basal insulin only for all insulin needs. Patient reports that when she takes Lantus 30 units daily she has frequent hypoglycemia when she is not able to eat. Discussed basal, meal coverage, and correction insulin and how they are intended to work to help with glycemic control.  Patient states that she was on 70/30 in the past and she only had to give herself 2 shots per day. Encouraged patient to discuss possibility of using 70/30 insulin with her doctor since she does not want to do 4 shots per day (with Lantus and Novolog). Patient verbalized understanding of information discussed and reports that she has a doctor in the room at this time so asked that we end conversation.  Thanks, Barnie Alderman, RN, MSN, CDE Diabetes Coordinator Inpatient Diabetes Program [phone] (Team Pager from 8am to 5pm)

## 2017-02-03 LAB — GLUCOSE, CAPILLARY
GLUCOSE-CAPILLARY: 277 mg/dL — AB (ref 65–99)
GLUCOSE-CAPILLARY: 51 mg/dL — AB (ref 65–99)
GLUCOSE-CAPILLARY: 111 mg/dL — AB (ref 65–99)
GLUCOSE-CAPILLARY: 233 mg/dL — AB (ref 65–99)
Glucose-Capillary: 272 mg/dL — ABNORMAL HIGH (ref 65–99)

## 2017-02-03 LAB — PROTIME-INR
INR: 1.12
Prothrombin Time: 14.3 seconds (ref 11.4–15.2)

## 2017-02-03 MED ORDER — WARFARIN SODIUM 7.5 MG PO TABS
7.5000 mg | ORAL_TABLET | ORAL | Status: AC
  Administered 2017-02-03: 7.5 mg via ORAL
  Filled 2017-02-03: qty 1

## 2017-02-03 MED ORDER — WARFARIN SODIUM 7.5 MG PO TABS
7.5000 mg | ORAL_TABLET | Freq: Once | ORAL | Status: AC
  Administered 2017-02-03: 7.5 mg via ORAL
  Filled 2017-02-03: qty 1

## 2017-02-03 NOTE — Progress Notes (Signed)
ANTICOAGULATION CONSULT NOTE - Follow Up Consult  Pharmacy Consult for warfarin Indication: catheter thrombus  Allergies  Allergen Reactions  . No Known Allergies     Patient Measurements: Height: 5\' 8"  (172.7 cm) Weight: 191 lb 1.6 oz (86.7 kg) IBW/kg (Calculated) : 63.9  Vital Signs: Temp: 98.3 F (36.8 C) (10/20 0855) Temp Source: Oral (10/20 0542) BP: 132/85 (10/20 0855) Pulse Rate: 87 (10/20 0855)  Labs:  Recent Labs  02/02/17 1854 02/02/17 1938 02/03/17 0550  HGB 9.4*  --   --   HCT 29.6*  --   --   PLT 315  --   --   LABPROT  --  15.4* 14.3  INR  --  1.23 1.12  CREATININE 8.95*  --   --     Estimated Creatinine Clearance: 10.2 mL/min (A) (by C-G formula based on SCr of 8.95 mg/dL (H)).  Assessment: 36 year old female on warfarin PTA to resume inpatient per pharmacy consult.  Last dose 01/31/17 at 2000PM. PTA regimen 7.5mg  daily.   INR today = 1.12  Goal of Therapy:  INR 2-3 Monitor platelets by anticoagulation protocol: Yes   Plan:  Repeat Warfarin 7.5mg  po x1 tonight. Daily PT/INR  Thank you Anette Guarneri, PharmD [phone] 02/03/2017,11:20 AM

## 2017-02-03 NOTE — Progress Notes (Signed)
Subjective  -   No new complaints   Physical Exam:  Left arm is swollen.  No significant erythema around the graft which is patent.       Assessment/Plan:    Continue with arm elevation Continue with IV antibiotics Anticipate discharge home on Monday INR was 1.1 today  [place], [place] 02/03/2017 2:56 PM --  Vitals:   02/03/17 0542 02/03/17 0855  BP: (!) 168/93 132/85  Pulse: 89 87  Resp: 18 16  Temp: 98.3 F (36.8 C) 98.3 F (36.8 C)  SpO2: 100% 100%    Intake/Output Summary (Last 24 hours) at 02/03/17 1456 Last data filed at 02/03/17 0900  Gross per 24 hour  Intake              590 ml  Output              900 ml  Net             -310 ml     Laboratory CBC    Component Value Date/Time   WBC 6.5 02/02/2017 1854   HGB 9.4 (L) 02/02/2017 1854   HCT 29.6 (L) 02/02/2017 1854   PLT 315 02/02/2017 1854    BMET    Component Value Date/Time   NA 132 (L) 02/02/2017 1854   K 3.6 02/02/2017 1854   CL 90 (L) 02/02/2017 1854   CO2 29 02/02/2017 1854   GLUCOSE 208 (H) 02/02/2017 1854   BUN 38 (H) 02/02/2017 1854   CREATININE 8.95 (H) 02/02/2017 1854   CREATININE 0.69 10/30/2012 1906   CALCIUM 8.7 (L) 02/02/2017 1854   GFRNONAA 5 (L) 02/02/2017 1854   GFRAA 6 (L) 02/02/2017 1854    COAG Lab Results  Component Value Date   INR 1.12 02/03/2017   INR 1.23 02/02/2017   INR 2.45 01/24/2017   No results found for: PTT  Antibiotics Anti-infectives    Start     Dose/Rate Route Frequency Ordered Stop   02/05/17 1200  vancomycin (VANCOCIN) IVPB 1000 mg/200 mL premix     1,000 mg 200 mL/hr over 60 Minutes Intravenous Every M-W-F (Hemodialysis) 02/02/17 1043     02/02/17 2000  cefTAZidime (FORTAZ) 2 g in dextrose 5 % 50 mL IVPB  Status:  Discontinued     2 g 100 mL/hr over 30 Minutes Intravenous Every M-W-F (2000) 02/01/17 1939 02/02/17 1043   02/02/17 1400  vancomycin (VANCOCIN) 2,000 mg in sodium chloride 0.9 % 400 mL IVPB     2,000 mg 400 mL/hr  over 60 Minutes Intravenous  Once 02/02/17 1049 02/02/17 2158   02/02/17 1200  vancomycin (VANCOCIN) IVPB 1000 mg/200 mL premix  Status:  Discontinued     1,000 mg 200 mL/hr over 60 Minutes Intravenous Every M-W-F (Hemodialysis) 02/01/17 1610 02/02/17 1043   02/02/17 1200  cefTAZidime (FORTAZ) 2 g in dextrose 5 % 50 mL IVPB    Comments:  Tube to HEMODIALYSIS   2 g 100 mL/hr over 30 Minutes Intravenous Every M-W-F (Hemodialysis) 02/02/17 1043     02/01/17 2000  cefTAZidime (FORTAZ) 1 g in dextrose 5 % 50 mL IVPB  Status:  Discontinued     1 g 100 mL/hr over 30 Minutes Intravenous Every 24 hours 02/01/17 1937 02/01/17 1938   02/01/17 2000  cefTAZidime (FORTAZ) 1 g in dextrose 5 % 50 mL IVPB     1 g 100 mL/hr over 30 Minutes Intravenous  Once 02/01/17 1938 02/01/17 2355   02/01/17 1800  piperacillin-tazobactam (ZOSYN) IVPB 3.375 g  Status:  Discontinued     3.375 g 12.5 mL/hr over 240 Minutes Intravenous Every 12 hours 02/01/17 1610 02/01/17 1923   02/01/17 1615  vancomycin (VANCOCIN) 2,000 mg in sodium chloride 0.9 % 500 mL IVPB  Status:  Discontinued     2,000 mg 250 mL/hr over 120 Minutes Intravenous  Once 02/01/17 1610 02/02/17 1043       V. Leia Alf, M.D. [facility] [place] Office: [phone] Pager:  [phone]

## 2017-02-04 LAB — GLUCOSE, CAPILLARY
GLUCOSE-CAPILLARY: 294 mg/dL — AB (ref 65–99)
GLUCOSE-CAPILLARY: 374 mg/dL — AB (ref 65–99)
Glucose-Capillary: 290 mg/dL — ABNORMAL HIGH (ref 65–99)

## 2017-02-04 LAB — PROTIME-INR
INR: 1.13
PROTHROMBIN TIME: 14.4 s (ref 11.4–15.2)

## 2017-02-04 MED ORDER — WARFARIN SODIUM 2.5 MG PO TABS: 12.5000 mg | ORAL_TABLET | Freq: Once | ORAL | Status: DC

## 2017-02-04 NOTE — Progress Notes (Signed)
Subjective  -   Wants to go home   Physical Exam:  Dressing changed.  Incision is clean without drainage.  No erythema       Assessment/Plan:    Continue current care.  Anticipate d/c tomorrrow  Annamarie Major 02/04/2017 10:59 AM --  Vitals:   02/04/17 0555 02/04/17 0815  BP: (!) 168/87 (!) 161/79  Pulse: 98 88  Resp: 19   Temp: 98.5 F (36.9 C) 98.5 F (36.9 C)  SpO2: 100% 100%    Intake/Output Summary (Last 24 hours) at 02/04/17 1059 Last data filed at 02/04/17 0556  Gross per 24 hour  Intake              240 ml  Output                0 ml  Net              240 ml     Laboratory CBC    Component Value Date/Time   WBC 6.5 02/02/2017 1854   HGB 9.4 (L) 02/02/2017 1854   HCT 29.6 (L) 02/02/2017 1854   PLT 315 02/02/2017 1854    BMET    Component Value Date/Time   NA 132 (L) 02/02/2017 1854   K 3.6 02/02/2017 1854   CL 90 (L) 02/02/2017 1854   CO2 29 02/02/2017 1854   GLUCOSE 208 (H) 02/02/2017 1854   BUN 38 (H) 02/02/2017 1854   CREATININE 8.95 (H) 02/02/2017 1854   CREATININE 0.69 10/30/2012 1906   CALCIUM 8.7 (L) 02/02/2017 1854   GFRNONAA 5 (L) 02/02/2017 1854   GFRAA 6 (L) 02/02/2017 1854    COAG Lab Results  Component Value Date   INR 1.13 02/04/2017   INR 1.12 02/03/2017   INR 1.23 02/02/2017   No results found for: PTT  Antibiotics Anti-infectives    Start     Dose/Rate Route Frequency Ordered Stop   02/05/17 1200  vancomycin (VANCOCIN) IVPB 1000 mg/200 mL premix     1,000 mg 200 mL/hr over 60 Minutes Intravenous Every M-W-F (Hemodialysis) 02/02/17 1043     02/02/17 2000  cefTAZidime (FORTAZ) 2 g in dextrose 5 % 50 mL IVPB  Status:  Discontinued     2 g 100 mL/hr over 30 Minutes Intravenous Every M-W-F (2000) 02/01/17 1939 02/02/17 1043   02/02/17 1400  vancomycin (VANCOCIN) 2,000 mg in sodium chloride 0.9 % 400 mL IVPB     2,000 mg 400 mL/hr over 60 Minutes Intravenous  Once 02/02/17 1049 02/02/17 2158   02/02/17  1200  vancomycin (VANCOCIN) IVPB 1000 mg/200 mL premix  Status:  Discontinued     1,000 mg 200 mL/hr over 60 Minutes Intravenous Every M-W-F (Hemodialysis) 02/01/17 1610 02/02/17 1043   02/02/17 1200  cefTAZidime (FORTAZ) 2 g in dextrose 5 % 50 mL IVPB    Comments:  Tube to HEMODIALYSIS   2 g 100 mL/hr over 30 Minutes Intravenous Every M-W-F (Hemodialysis) 02/02/17 1043     02/01/17 2000  cefTAZidime (FORTAZ) 1 g in dextrose 5 % 50 mL IVPB  Status:  Discontinued     1 g 100 mL/hr over 30 Minutes Intravenous Every 24 hours 02/01/17 1937 02/01/17 1938   02/01/17 2000  cefTAZidime (FORTAZ) 1 g in dextrose 5 % 50 mL IVPB     1 g 100 mL/hr over 30 Minutes Intravenous  Once 02/01/17 1938 02/01/17 2355   02/01/17 1800  piperacillin-tazobactam (ZOSYN) IVPB 3.375 g  Status:  Discontinued     3.375 g 12.5 mL/hr over 240 Minutes Intravenous Every 12 hours 02/01/17 1610 02/01/17 1923   02/01/17 1615  vancomycin (VANCOCIN) 2,000 mg in sodium chloride 0.9 % 500 mL IVPB  Status:  Discontinued     2,000 mg 250 mL/hr over 120 Minutes Intravenous  Once 02/01/17 1610 02/02/17 1043       V. Leia Alf, M.D. [facility] [place] Office: [phone] Pager:  [phone]

## 2017-02-04 NOTE — Progress Notes (Signed)
ANTICOAGULATION CONSULT NOTE - Follow Up Consult  Pharmacy Consult for warfarin Indication: catheter thrombus  Allergies  Allergen Reactions  . No Known Allergies     Patient Measurements: Height: 5\' 8"  (172.7 cm) Weight: 190 lb 11.2 oz (86.5 kg) IBW/kg (Calculated) : 63.9  Vital Signs: Temp: 98.5 F (36.9 C) (10/21 0815) Temp Source: Oral (10/21 0815) BP: 142/74 (10/21 1159) Pulse Rate: 87 (10/21 1159)  Labs:  Recent Labs  02/02/17 1854 02/02/17 1938 02/03/17 0550 02/04/17 0418  HGB 9.4*  --   --   --   HCT 29.6*  --   --   --   PLT 315  --   --   --   LABPROT  --  15.4* 14.3 14.4  INR  --  1.23 1.12 1.13  CREATININE 8.95*  --   --   --     Estimated Creatinine Clearance: 10.2 mL/min (A) (by C-G formula based on SCr of 8.95 mg/dL (H)).  Assessment: 36 year old female on warfarin PTA to resume inpatient per pharmacy consult.  Last dose 01/31/17 at 2000PM. PTA regimen 7.5mg  daily.   INR today = 1.13  Goal of Therapy:  INR 2-3 Monitor platelets by anticoagulation protocol: Yes   Plan:  Warfarin 12.5 mg po x 1 at 1800 pm Daily PT/INR  Thank you Anette Guarneri, PharmD [phone] 02/04/2017,12:06 PM

## 2017-02-05 LAB — PROTIME-INR
INR: 1.17
Prothrombin Time: 14.8 seconds (ref 11.4–15.2)

## 2017-02-05 LAB — GLUCOSE, CAPILLARY: Glucose-Capillary: 266 mg/dL — ABNORMAL HIGH (ref 65–99)

## 2017-02-05 NOTE — Progress Notes (Signed)
No further bleeding. Still some swelling but improved Wants to go home  Will d/c home today Total 1 weeks of Vanc Needs follow up with me in 2 weeks  Ruta Hinds, MD [facility] [place]: [phone] Pager: [phone]

## 2017-02-05 NOTE — Progress Notes (Signed)
Spoke with Dr. Lorrene Reid for pt to receive Vancomycin x 1 week at her dialysis center.  Leontine Locket, [facility] 02/05/2017 8:18 AM

## 2017-02-05 NOTE — Discharge Summary (Signed)
[facility] Discharge Summary   Patient ID:  Carrie [name] MRN: [id number] DOB/AGE: [birth year] 36 y.o.  Admit date: 02/01/2017 Discharge date: 02/05/2017 Date of Surgery:  Surgeon: * Surgery not found *  Admission Diagnosis: blleding for lt arm graft  Discharge Diagnoses:  blleding for lt arm graft  Secondary Diagnoses: Past Medical History:  Diagnosis Date  . Anemia   . Diabetic neuropathy ([place])   . Diabetic retinopathy ([place])   . ESRD (end stage renal disease) on dialysis [facility])    "MWF; [place]" (02/02/2017)  . Gastroparesis   . HA (headache)    "qd if I didn't take my RX" (02/02/2017)  . Hemodialysis access site with arteriovenous graft ([place])   . Hypertension   . Neuropathy   . Trigeminal neuralgia   . Type I diabetes mellitus ([place])       Discharged Condition: good  HPI: Carrie [name] is a 36 y.o. year old female who is s/p Left Forearm AV graft placement on 01-15-17 by Dr. Oneida Alar.   she still had significant swelling in the forearm and hand with pain around the graft. She denied any numbness or tingling in her hand at that point.   She currently is dialyzing via a right-sided catheter.   Admit to [facility] today, to Dr. Oneida Alar, with IV antibx, Vancomycin and Zosyn, managed by pharmacy. Dx: bleeding from left arm AV graft, pt on coumadin.     Hospital Course:  Carrie [name] is a 36 y.o. female is S/P Left Forearm AV graft placement on 01-15-17.  Admitted for IV antibiotics and to watch for bleeding reoccurrence.    Will continue IV antibiotics over the weekend with intent to treat for full 7 days.  If no further bleeding and no signs of infection will most likely d/c home Monday.  She is on coumadin for catheter thrombus.  Will continue this for now. F/U with Dr. Oneida Alar in 2 weeks.  Consults:  Treatment Team:  Roney Jaffe, MD  Significant Diagnostic Studies: CBC Lab Results  Component Value Date   WBC 6.5 02/02/2017   HGB 9.4 (L) 02/02/2017    HCT 29.6 (L) 02/02/2017   MCV 95.8 02/02/2017   PLT 315 02/02/2017    BMET    Component Value Date/Time   NA 132 (L) 02/02/2017 1854   K 3.6 02/02/2017 1854   CL 90 (L) 02/02/2017 1854   CO2 29 02/02/2017 1854   GLUCOSE 208 (H) 02/02/2017 1854   BUN 38 (H) 02/02/2017 1854   CREATININE 8.95 (H) 02/02/2017 1854   CREATININE 0.69 10/30/2012 1906   CALCIUM 8.7 (L) 02/02/2017 1854   GFRNONAA 5 (L) 02/02/2017 1854   GFRAA 6 (L) 02/02/2017 1854   COAG Lab Results  Component Value Date   INR 1.17 02/05/2017   INR 1.13 02/04/2017   INR 1.12 02/03/2017     Disposition:  Discharge to :Home Discharge Instructions    Call MD for:  redness, tenderness, or signs of infection (pain, swelling, bleeding, redness, odor or green/yellow discharge around incision site)    Complete by:  As directed    Call MD for:  severe or increased pain, loss or decreased feeling  in affected limb(s)    Complete by:  As directed    Call MD for:  temperature >100.5    Complete by:  As directed    Discharge instructions    Complete by:  As directed    Vanco HD for 1 week  Resume previous diet    Complete by:  As directed      Allergies as of 02/05/2017      Reactions   No Known Allergies       Medication List    TAKE these medications   acetaminophen 325 MG tablet Commonly known as:  TYLENOL Take 650 mg by mouth every 6 (six) hours as needed for mild pain.   carbamazepine 400 MG 12 hr tablet Commonly known as:  TEGRETOL XR Take 200 mg by mouth once.   carbamazepine 200 MG tablet Commonly known as:  TEGRETOL Take 200 tablets by mouth daily.   diclofenac sodium 1 % Gel Commonly known as:  VOLTAREN Apply 2 g topically 4 (four) times daily.   diphenhydrAMINE 25 MG tablet Commonly known as:  BENADRYL Take 50 mg by mouth at bedtime as needed for sleep.   diphenhydramine-acetaminophen 25-500 MG Tabs tablet Commonly known as:  TYLENOL PM Take 2 tablets by mouth at bedtime.    gabapentin 300 MG capsule Commonly known as:  NEURONTIN Take 1 capsule (300 mg total) by mouth at bedtime. What changed:  when to take this   Insulin NPH (Human) (Isophane) 100 UNIT/ML Kiwkpen Commonly known as:  HUMULIN N KWIKPEN Inject 30 Units into the skin every morning. And pen needles 1/day   labetalol 300 MG tablet Commonly known as:  NORMODYNE Take 300 mg by mouth See admin instructions. Take 300mg  tablet at bedtime on diaylsis days M, W, F. Take 300mg  tablet twice daily on all other days   LANTUS SOLOSTAR 100 UNIT/ML Solostar Pen Generic drug:  Insulin Glargine Inject 30 Units into the skin daily.   ondansetron 4 MG tablet Commonly known as:  ZOFRAN Take 1 tablet (4 mg total) by mouth every 8 (eight) hours as needed for nausea or vomiting.   oxyCODONE-acetaminophen 5-325 MG tablet Commonly known as:  PERCOCET/ROXICET Take 1-2 tablets by mouth every 6 (six) hours as needed for severe pain.   oxyCODONE-acetaminophen 5-325 MG tablet Commonly known as:  PERCOCET/ROXICET Take 1 tablet by mouth every 8 (eight) hours as needed for severe pain.   promethazine 25 MG tablet Commonly known as:  PHENERGAN Take 1 tablet (25 mg total) by mouth every 8 (eight) hours as needed for nausea or vomiting.   REGLAN 5 MG tablet Generic drug:  metoCLOPramide Take 5 mg by mouth every 6 (six) hours as needed (nausea).   sevelamer carbonate 800 MG tablet Commonly known as:  RENVELA Take 800 mg by mouth 3 (three) times daily with meals.   warfarin 5 MG tablet Commonly known as:  COUMADIN Take 1.5 tablets (7.5 mg total) by mouth one time only at 6 PM.      Verbal and written Discharge instructions given to the patient. Wound care per Discharge AVS   Signed: Laurence Slate [facility] 02/05/2017, 9:56 AM

## 2017-02-05 NOTE — Progress Notes (Signed)
Discharge instructions, medications and follow up appointments discussed and reviewed with pt, verbalized understanding. Iv dc'ed, site clean and dry. Telemetry dc'ed, CCMD notified. Pt was escorted out if the unit in wheelchair, took all belongings with her.

## 2017-02-13 ENCOUNTER — Ambulatory Visit: Payer: BLUE CROSS/BLUE SHIELD | Admitting: Endocrinology

## 2017-02-15 ENCOUNTER — Encounter: Payer: Self-pay | Admitting: Vascular Surgery

## 2017-02-15 ENCOUNTER — Ambulatory Visit (INDEPENDENT_AMBULATORY_CARE_PROVIDER_SITE_OTHER): Payer: Self-pay | Admitting: Vascular Surgery

## 2017-02-15 VITALS — BP 140/87 | HR 93 | Temp 100.7°F | Resp 16 | Ht 68.0 in | Wt 194.0 lb

## 2017-02-15 DIAGNOSIS — N186 End stage renal disease: Secondary | ICD-10-CM

## 2017-02-15 DIAGNOSIS — Z992 Dependence on renal dialysis: Secondary | ICD-10-CM

## 2017-02-15 NOTE — Progress Notes (Signed)
Patient is a 36 year old female returns for follow-up today. She had a left forearm AV graft placed on 01/15/2017. This was complicated by Gore-Tex reaction. She actually required readmission to the hospital a few days of antibiotics better. She currently is dialyzing via a right-sided catheter. The left forearm is significantly less swollen. She has less pain.  Physical exam:  Vitals:   02/15/17 1131  BP: 140/87  Pulse: 93  Resp: 16  Temp: (!) 100.7 F (38.2 C)  TempSrc: Oral  SpO2: 99%  Weight: 194 lb (88 kg)  Height: 5\' 8"  (1.727 m)    Healing incisions left upper extremity still slightly swollen but probably 75% improvement from postop no incisional drainage or erythema, easily audible bruit and graft  Assessment: Slowly improving from left forearm AV graft placement.  Plan: Since she still has some edema and pain around the graft I would prefer to wait for 6 more weeks before cannulating the graft. She will continue to use her catheter in the meanwhile. She will follow-up with [place] on as-needed basis.  Ruta Hinds, MD [facility] [place] Office: [phone] Pager: [phone]

## 2017-03-01 ENCOUNTER — Encounter: Payer: Self-pay | Admitting: Endocrinology

## 2017-03-01 ENCOUNTER — Ambulatory Visit: Payer: BLUE CROSS/BLUE SHIELD | Admitting: Endocrinology

## 2017-03-01 VITALS — BP 138/92 | HR 96 | Wt 196.4 lb

## 2017-03-01 DIAGNOSIS — Z992 Dependence on renal dialysis: Secondary | ICD-10-CM | POA: Diagnosis not present

## 2017-03-01 DIAGNOSIS — E1122 Type 2 diabetes mellitus with diabetic chronic kidney disease: Secondary | ICD-10-CM | POA: Diagnosis not present

## 2017-03-01 DIAGNOSIS — Z794 Long term (current) use of insulin: Secondary | ICD-10-CM

## 2017-03-01 DIAGNOSIS — N186 End stage renal disease: Secondary | ICD-10-CM

## 2017-03-01 MED ORDER — INSULIN NPH (HUMAN) (ISOPHANE) 100 UNIT/ML ~~LOC~~ SUSP: 25.0000 [IU] | SUBCUTANEOUS | 11 refills | Status: DC

## 2017-03-01 NOTE — Patient Instructions (Addendum)
I have sent a prescription to your pharmacy, to change both current insulins to "NPH," 30 units each morning.  check your blood sugar 5 times a day: before the 3 meals, and at bedtime.  also check after meals, on a rotating basis.  if you have symptoms of your blood sugar being too high or too low.  please keep a record of the readings and bring it to your next appointment here (or you can bring the meter itself).  You can write it on any piece of paper.  please call us sooner if your blood sugar goes below 70, or if you have a lot of readings over 200.  Please come back for a follow-up appointment in 2 months.

## 2017-03-01 NOTE — Progress Notes (Signed)
Subjective:    Patient ID: Carrie [name], female    DOB: [birth year], 36 y.o.   MRN: [id number]  HPI Pt returns for f/u of diabetes mellitus: DM type: Insulin-requiring type 2 Dx'ed: 9470 Complications: polyneuropathy, renal insufficiency, gastroparesis, and retinopathy Therapy: insulin since 2003 pregnancy.  GDM: never DKA: never Severe hypoglycemia: last episode was early 2018.  Pancreatitis: never.  Pancreatic imaging:  Other: she declines multiple daily injections.  Interval history:  No cbg record, but she says cbg varies from 40-100's.  It is in general higher as the day goes on. She did not change to the NPH insulin, as rx'ed.   Past Medical History:  Diagnosis Date  . Anemia   . Diabetic neuropathy ([place])   . Diabetic retinopathy ([place])   . ESRD (end stage renal disease) on dialysis [facility])    "MWF; [place]" (02/02/2017)  . Gastroparesis   . HA (headache)    "qd if I didn't take my RX" (02/02/2017)  . Hemodialysis access site with arteriovenous graft ([place])   . Hypertension   . Neuropathy   . Trigeminal neuralgia   . Type I diabetes mellitus ([place])     Past Surgical History:  Procedure Laterality Date  . CANCELLED PROCEDURE  01/10/2017   Performed by Skeet Latch, MD at [place]  . DILATION AND CURETTAGE OF UTERUS    . EXCISIONAL HEMORRHOIDECTOMY    . EYE SURGERY    . INSERTION OF  AV GORE-TEX GRAFT Left 01/15/2017   Performed by Elam Dutch, MD at [facility]  . INSERTION OF DIALYSIS CATHETER Right 09/07/2016   Performed by [name], MD at [facility]  . Left arm 1st Stage Basilic AV Fistula Left 9/62/8366   Performed by [name], MD at [place] Bilateral    "related to diabetes"  . TRANSESOPHAGEAL ECHOCARDIOGRAM (TEE) N/A 01/11/2017   Performed by Jerline Pain, MD at [place] History   Socioeconomic History  . Marital status: Single    Spouse name: Not on file  . Number of children: 1  . Years of  education: 84  . Highest education level: Not on file  Social Needs  . Financial resource strain: Not on file  . Food insecurity - worry: Not on file  . Food insecurity - inability: Not on file  . Transportation needs - medical: Not on file  . Transportation needs - non-medical: Not on file  Occupational History  . Occupation: Call Center    Employer: [employer]  Tobacco Use  . Smoking status: Never Smoker  . Smokeless tobacco: Never Used  Substance and Sexual Activity  . Alcohol use: Yes    Alcohol/week: 2.0 oz    Types: 4 Standard drinks or equivalent per week    Comment: 02/02/2017 "nothing in the last 6 months; never had problem w/it"  . Drug use: No  . Sexual activity: Yes  Other Topics Concern  . Not on file  Social History Narrative   Patient lives at home with her father and daughter.    Education some college.   Right handed   Caffeine Five sodas daily.          Current Outpatient Medications on File Prior to Visit  Medication Sig Dispense Refill  . carbamazepine (TEGRETOL) 200 MG tablet Take 200 tablets by mouth daily.  1  . diphenhydramine-acetaminophen (TYLENOL PM) 25-500 MG TABS tablet Take 2 tablets by  mouth at bedtime.    . gabapentin (NEURONTIN) 300 MG capsule Take 1 capsule (300 mg total) by mouth at bedtime. (Patient taking differently: Take 300 mg by mouth 3 (three) times daily. )    . labetalol (NORMODYNE) 300 MG tablet Take 300 mg by mouth See admin instructions. Take 300mg  tablet at bedtime on diaylsis days M, W, F. Take 300mg  tablet twice daily on all other days  0  . metoCLOPramide (REGLAN) 5 MG tablet Take 5 mg by mouth every 6 (six) hours as needed (nausea).     . ondansetron (ZOFRAN) 4 MG tablet Take 1 tablet (4 mg total) by mouth every 8 (eight) hours as needed for nausea or vomiting. 20 tablet 0  . promethazine (PHENERGAN) 25 MG tablet Take 1 tablet (25 mg total) by mouth every 8 (eight) hours as needed for nausea or vomiting. 10 tablet 0    . sevelamer carbonate (RENVELA) 800 MG tablet Take 800 mg by mouth 3 (three) times daily with meals.    . warfarin (COUMADIN) 5 MG tablet Take 1.5 tablets (7.5 mg total) by mouth one time only at 6 PM. (Patient taking differently: Take 7.5 mg by mouth one time only at 6 PM. 7.5mg  and 10mg  QOD) 90 tablet 0  . diclofenac sodium (VOLTAREN) 1 % GEL Apply 2 g topically 4 (four) times daily. (Patient not taking: Reported on 03/01/2017) 1 Tube 0   No current facility-administered medications on file prior to visit.     Allergies  Allergen Reactions  . No Known Allergies     Family History  Problem Relation Age of Onset  . Hyperlipidemia Mother   . Asthma Daughter   . Diabetes Maternal Grandmother   . Stroke Maternal Grandmother   . Kidney disease Maternal Grandfather   . Kidney disease Paternal Grandmother     BP (!) 138/92 (BP Location: Right Arm, Patient Position: Sitting, Cuff Size: Normal)   Pulse 96   Wt 196 lb 6.4 oz (89.1 kg)   LMP 07/17/2016 (Within Weeks) Comment: Has not had a period since starting dialysis in May  SpO2 98%   BMI 29.86 kg/m    Review of Systems Denies LOC    Objective:   Physical Exam VITAL SIGNS:  See vs page GENERAL: no distress.  Pulses: foot pulses are intact bilaterally.   MSK: no deformity of the feet or ankles.  CV: no edema of the legs or ankles Skin:  no ulcer on the feet or ankles.  normal color and temp on the feet and ankles Neuro: sensation is intact to touch on the feet and ankles, but decreased from normal Ext: There is bilateral onychomycosis of the toenails.  Left great toenail is bandaged   Lab Results  Component Value Date   HGBA1C 8.2 01/31/2017      Assessment & Plan:  Type 1 DM, with DR. She needs increased rx Renal insuff: she need a faster-acting QD insulin.  Noncompliance with cbg recording and insulin.  We discussed.    Patient Instructions  I have sent a prescription to your pharmacy, to change both current  insulins to "NPH," 30 units each morning.  check your blood sugar 5 times a day: before the 3 meals, and at bedtime.  also check after meals, on a rotating basis.  if you have symptoms of your blood sugar being too high or too low.  please keep a record of the readings and bring it to your next appointment here (or you  can bring the meter itself).  You can write it on any piece of paper.  please call us sooner if your blood sugar goes below 70, or if you have a lot of readings over 200.  Please come back for a follow-up appointment in 2 months.

## 2017-03-28 ENCOUNTER — Telehealth: Payer: Self-pay | Admitting: Endocrinology

## 2017-03-28 NOTE — Telephone Encounter (Signed)
Di Kindle at [facility] called for visit dates for 2018 for processing claim and requesting medical records.

## 2017-05-01 ENCOUNTER — Ambulatory Visit: Payer: BLUE CROSS/BLUE SHIELD | Admitting: Endocrinology

## 2017-05-04 ENCOUNTER — Inpatient Hospital Stay (HOSPITAL_COMMUNITY)
Admission: EM | Admit: 2017-05-04 | Discharge: 2017-05-07 | DRG: 073 | Disposition: A | Discharge status: Home / Self Care | Payer: BLUE CROSS/BLUE SHIELD | Attending: Internal Medicine | Admitting: Internal Medicine

## 2017-05-04 ENCOUNTER — Encounter (HOSPITAL_COMMUNITY): Payer: Self-pay | Admitting: *Deleted

## 2017-05-04 ENCOUNTER — Other Ambulatory Visit: Payer: Self-pay

## 2017-05-04 ENCOUNTER — Emergency Department (HOSPITAL_COMMUNITY): Payer: BLUE CROSS/BLUE SHIELD

## 2017-05-04 DIAGNOSIS — T82868A Thrombosis of vascular prosthetic devices, implants and grafts, initial encounter: Secondary | ICD-10-CM | POA: Diagnosis not present

## 2017-05-04 DIAGNOSIS — E1065 Type 1 diabetes mellitus with hyperglycemia: Secondary | ICD-10-CM | POA: Diagnosis not present

## 2017-05-04 DIAGNOSIS — E1042 Type 1 diabetes mellitus with diabetic polyneuropathy: Secondary | ICD-10-CM | POA: Diagnosis present

## 2017-05-04 DIAGNOSIS — I120 Hypertensive chronic kidney disease with stage 5 chronic kidney disease or end stage renal disease: Secondary | ICD-10-CM | POA: Diagnosis present

## 2017-05-04 DIAGNOSIS — R0789 Other chest pain: Secondary | ICD-10-CM | POA: Diagnosis present

## 2017-05-04 DIAGNOSIS — Z992 Dependence on renal dialysis: Secondary | ICD-10-CM

## 2017-05-04 DIAGNOSIS — Z794 Long term (current) use of insulin: Secondary | ICD-10-CM | POA: Diagnosis not present

## 2017-05-04 DIAGNOSIS — E1022 Type 1 diabetes mellitus with diabetic chronic kidney disease: Secondary | ICD-10-CM | POA: Diagnosis present

## 2017-05-04 DIAGNOSIS — E1043 Type 1 diabetes mellitus with diabetic autonomic (poly)neuropathy: Secondary | ICD-10-CM | POA: Diagnosis present

## 2017-05-04 DIAGNOSIS — N2581 Secondary hyperparathyroidism of renal origin: Secondary | ICD-10-CM | POA: Diagnosis present

## 2017-05-04 DIAGNOSIS — I208 Other forms of angina pectoris: Secondary | ICD-10-CM | POA: Diagnosis not present

## 2017-05-04 DIAGNOSIS — R739 Hyperglycemia, unspecified: Secondary | ICD-10-CM

## 2017-05-04 DIAGNOSIS — Z86718 Personal history of other venous thrombosis and embolism: Secondary | ICD-10-CM | POA: Diagnosis not present

## 2017-05-04 DIAGNOSIS — D631 Anemia in chronic kidney disease: Secondary | ICD-10-CM | POA: Diagnosis present

## 2017-05-04 DIAGNOSIS — K3184 Gastroparesis: Secondary | ICD-10-CM | POA: Diagnosis present

## 2017-05-04 DIAGNOSIS — Z7901 Long term (current) use of anticoagulants: Secondary | ICD-10-CM

## 2017-05-04 DIAGNOSIS — R079 Chest pain, unspecified: Secondary | ICD-10-CM

## 2017-05-04 DIAGNOSIS — N186 End stage renal disease: Secondary | ICD-10-CM | POA: Diagnosis present

## 2017-05-04 DIAGNOSIS — R002 Palpitations: Secondary | ICD-10-CM | POA: Diagnosis present

## 2017-05-04 DIAGNOSIS — E1143 Type 2 diabetes mellitus with diabetic autonomic (poly)neuropathy: Secondary | ICD-10-CM | POA: Diagnosis not present

## 2017-05-04 DIAGNOSIS — E8889 Other specified metabolic disorders: Secondary | ICD-10-CM | POA: Diagnosis present

## 2017-05-04 DIAGNOSIS — Z79899 Other long term (current) drug therapy: Secondary | ICD-10-CM | POA: Diagnosis not present

## 2017-05-04 DIAGNOSIS — E1049 Type 1 diabetes mellitus with other diabetic neurological complication: Secondary | ICD-10-CM | POA: Diagnosis not present

## 2017-05-04 HISTORY — DX: Disorder of kidney and ureter, unspecified: N28.9

## 2017-05-04 LAB — I-STAT VENOUS BLOOD GAS, ED
Acid-Base Excess: 1 mmol/L (ref 0.0–2.0)
BICARBONATE: 27.4 mmol/L (ref 20.0–28.0)
O2 SAT: 65 %
PCO2 VEN: 50.8 mmHg (ref 44.0–60.0)
PO2 VEN: 36 mmHg (ref 32.0–45.0)
TCO2: 29 mmol/L (ref 22–32)
pH, Ven: 7.339 (ref 7.250–7.430)

## 2017-05-04 LAB — CBC
HEMATOCRIT: 33.7 % — AB (ref 36.0–46.0)
HEMOGLOBIN: 11.1 g/dL — AB (ref 12.0–15.0)
MCH: 30 pg (ref 26.0–34.0)
MCHC: 32.9 g/dL (ref 30.0–36.0)
MCV: 91.1 fL (ref 78.0–100.0)
Platelets: 214 10*3/uL (ref 150–400)
RBC: 3.7 MIL/uL — AB (ref 3.87–5.11)
RDW: 17 % — ABNORMAL HIGH (ref 11.5–15.5)
WBC: 6.4 10*3/uL (ref 4.0–10.5)

## 2017-05-04 LAB — BASIC METABOLIC PANEL
Anion gap: 15 (ref 5–15)
BUN: 37 mg/dL — ABNORMAL HIGH (ref 6–20)
CHLORIDE: 87 mmol/L — AB (ref 101–111)
CO2: 23 mmol/L (ref 22–32)
CREATININE: 9.02 mg/dL — AB (ref 0.44–1.00)
Calcium: 8.5 mg/dL — ABNORMAL LOW (ref 8.9–10.3)
GFR calc non Af Amer: 5 mL/min — ABNORMAL LOW (ref >60)
GFR, EST AFRICAN AMERICAN: 6 mL/min — AB (ref >60)
Glucose, Bld: 706 mg/dL (ref 65–99)
POTASSIUM: 5 mmol/L (ref 3.5–5.1)
SODIUM: 125 mmol/L — AB (ref 135–145)

## 2017-05-04 LAB — I-STAT TROPONIN, ED
Troponin i, poc: 0.02 ng/mL (ref 0.00–0.08)
Troponin i, poc: 0.02 ng/mL (ref 0.00–0.08)

## 2017-05-04 LAB — URINALYSIS, ROUTINE W REFLEX MICROSCOPIC
BILIRUBIN URINE: NEGATIVE
KETONES UR: NEGATIVE mg/dL
LEUKOCYTES UA: NEGATIVE
NITRITE: NEGATIVE
PH: 8 (ref 5.0–8.0)
Protein, ur: >=300 mg/dL — AB
SPECIFIC GRAVITY, URINE: 1.018 (ref 1.005–1.030)

## 2017-05-04 LAB — CBG MONITORING, ED
Glucose-Capillary: 427 mg/dL — ABNORMAL HIGH (ref 65–99)
Glucose-Capillary: 312 mg/dL — ABNORMAL HIGH (ref 65–99)
Glucose-Capillary: 257 mg/dL — ABNORMAL HIGH (ref 65–99)

## 2017-05-04 LAB — BETA-HYDROXYBUTYRIC ACID: BETA-HYDROXYBUTYRIC ACID: 0.26 mmol/L (ref 0.05–0.27)

## 2017-05-04 LAB — I-STAT BETA HCG BLOOD, ED (MC, WL, AP ONLY)

## 2017-05-04 MED ORDER — HALOPERIDOL LACTATE 5 MG/ML IJ SOLN
2.0000 mg | Freq: Once | INTRAMUSCULAR | Status: AC
  Administered 2017-05-04: 2 mg via INTRAVENOUS
  Filled 2017-05-04: qty 1

## 2017-05-04 MED ORDER — SODIUM CHLORIDE 0.9 % IV SOLN
INTRAVENOUS | Status: DC
  Filled 2017-05-04: qty 1

## 2017-05-04 MED ORDER — INSULIN ASPART 100 UNIT/ML ~~LOC~~ SOLN
9.0000 [IU] | Freq: Once | SUBCUTANEOUS | Status: AC
  Administered 2017-05-04: 9 [IU] via SUBCUTANEOUS
  Filled 2017-05-04: qty 1

## 2017-05-04 MED ORDER — LABETALOL HCL 200 MG PO TABS
300.0000 mg | ORAL_TABLET | Freq: Once | ORAL | Status: AC
  Administered 2017-05-04: 300 mg via ORAL
  Filled 2017-05-04: qty 2

## 2017-05-04 NOTE — ED Provider Notes (Signed)
Patient placed in Quick Look pathway, seen and evaluated   Chief Complaint: palpitations   HPI:   36 year old female reports palpitations this morning.  She notes they have since resolved.  She denies any feelings of tachycardia, reports that she could feel her heart beating in her chest radiating to her chest wall.  Patient reports she is always nauseous secondary to gastroparesis, no worsening nausea or vomiting, denies any significant pain with associated event.  ROS: Cardiovascular: palpitations (one)  Physical Exam:   Gen: No distress  Neuro: Awake and Alert  Skin: Warm    Focused Exam:  Cardiac; RRR no murmur    Initiation of care has begun. The patient has been counseled on the process, plan, and necessity for staying for the completion/evaluation, and the remainder of the medical screening examination    Francee Gentile 05/04/17 [place]    Noemi Chapel, MD 05/05/17 1452

## 2017-05-04 NOTE — ED Notes (Signed)
Pt CBG 312 RN notified.

## 2017-05-04 NOTE — ED Provider Notes (Signed)
I saw and evaluated the patient, reviewed the resident's note and I agree with the findings and plan. I was present and provided direct supervision for all procedures. Please see associated encounter note. Briefly, the patient is a 37 year old female with history of end-stage renal disease, diabetes, here with nausea, vomiting, and intermittent chest pain and palpitations.  The chest pain and palpitations have been ongoing since her recent diagnosis of possible thrombus on her Vas-Cath.  She is being worked up for this.  Her nausea and vomiting has been worsening over the last several days.  She has a history of gastroparesis.  Lab work shows that she is significantly hyperglycemic.  She does have a mildly elevated anion gap but pH is normal as well as bicarb.  I suspect she is likely hyperglycemic and dehydrated.  There is no overt evidence of DKA, though she does have history of the same.  Will give insulin, reassess.  If patient has persistent nausea and vomiting, will likely need admission given that she has borderline DKA.    EKG Interpretation  Date/Time:  Friday May 04 2017 15:13:50 EST Ventricular Rate:  90 PR Interval:  148 QRS Duration: 80 QT Interval:  344 QTC Calculation: 420 R Axis:   32 Text Interpretation:  Normal sinus rhythm Cannot rule out Anterior infarct , age undetermined ST & T wave abnormality, consider inferior ischemia Abnormal ECG Since last EKG, non-specific TWI and ST depressions in inferior and anterior are new Confirmed by Duffy Bruce [phone]) on 05/04/2017 7:02:33 PM         Duffy Bruce, MD 05/05/17 1102

## 2017-05-04 NOTE — H&P (Signed)
[facility]                                                                                    Patient Demographics  Carrie [name], is a 37 y.o. female  CSN: [id number]  MRN: [id number]  DOB - 1980/12/23  Admit Date - 05/04/2017  Outpatient Primary MD for the patient is Glendon Axe, MD   With History of -  Past Medical History:  Diagnosis Date  . Anemia   . Diabetic neuropathy ([place])   . Diabetic retinopathy ([place])   . ESRD (end stage renal disease) on dialysis [facility])    "MWF; [place]" (02/02/2017)  . Gastroparesis   . HA (headache)    "qd if I didn't take my RX" (02/02/2017)  . Hemodialysis access site with arteriovenous graft ([place])   . Hypertension   . Neuropathy   . Trigeminal neuralgia   . Type I diabetes mellitus ([place])       Past Surgical History:  Procedure Laterality Date  . AV FISTULA PLACEMENT Left 09/07/2016   Procedure: Left arm 1st Stage Basilic AV Fistula;  Surgeon: [name], MD;  Location: [facility] OR;  Service: Vascular;  Laterality: Left;  . DILATION AND CURETTAGE OF UTERUS    . EXCISIONAL HEMORRHOIDECTOMY    . EYE SURGERY    . INSERTION OF DIALYSIS CATHETER Right 09/07/2016   Procedure: INSERTION OF DIALYSIS CATHETER;  Surgeon: [name], MD;  Location: [facility] OR;  Service: Vascular;  Laterality: Right;  . RETINAL LASER PROCEDURE Bilateral    "related to diabetes"  . REVISON OF ARTERIOVENOUS FISTULA Left 01/15/2017   Procedure: INSERTION OF  AV GORE-TEX GRAFT;  Surgeon: Elam Dutch, MD;  Location: [place];  Service: Vascular;  Laterality: Left;  . TEE WITHOUT CARDIOVERSION N/A 01/11/2017   Procedure: TRANSESOPHAGEAL ECHOCARDIOGRAM (TEE);  Surgeon: Jerline Pain, MD;  Location: [facility] ENDOSCOPY;  Service: Cardiovascular;  Laterality: N/A;    in for   Chief Complaint  Patient presents with  . Chest Pain  . Palpitations     HPI  Carrie [name]  is a 37 y.o. female, with past medical history significant for end-stage renal disease on  hemodialysis, diabetes mellitus and hypertension, presenting with chest pain/palpitations episode that started early today. The palpitations started first and it was recurrent then patient started having chest pain, nonradiating, no associated cold sweats or shortness of breath. 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 The patient has a dialysis fistula right now and is worried about the thrombus since the Vas Cath Is not being used for dialysis anymore . In the emergency room patient was noted to be hyperglycemic, but not in DKA.   Review of Systems    In addition to the HPI above,  No Fever-chills, No Headache, No changes with Vision or hearing, No problems swallowing food or Liquids, No Abdominal pain, No Nausea or Vommitting, Bowel movements are regular, No Blood in stool or Urine, No dysuria, No new skin rashes or bruises, No new joints pains-aches,  No new weakness, tingling, numbness in any extremity, No recent weight gain  or loss, No polyuria, polydypsia or polyphagia, No significant Mental Stressors.  A full 10 point Review of Systems was done, except as stated above, all other Review of Systems were negative.   Social History Social History   Tobacco Use  . Smoking status: Never Smoker  . Smokeless tobacco: Never Used  Substance Use Topics  . Alcohol use: Yes    Alcohol/week: 2.0 oz    Types: 4 Standard drinks or equivalent per week    Comment: 02/02/2017 "nothing in the last 6 months; never had problem w/it"     Family History Family History  Problem Relation Age of Onset  . Hyperlipidemia Mother   . Asthma Daughter   . Diabetes Maternal Grandmother   . Stroke Maternal Grandmother   . Kidney disease Maternal Grandfather   . Kidney disease Paternal Grandmother      Prior to Admission medications   Medication Sig Start Date End Date Taking? Authorizing Provider  carbamazepine (TEGRETOL) 200 MG  tablet Take 200 tablets by mouth at bedtime.  01/03/17  Yes [provider]  diclofenac sodium (VOLTAREN) 1 % GEL Apply 2 g topically 4 (four) times daily. 01/24/17  Yes Lacroce, Hulen Shouts, MD  diphenhydramine-acetaminophen (TYLENOL PM) 25-500 MG TABS tablet Take 2 tablets by mouth at bedtime as needed.    Yes [provider]  gabapentin (NEURONTIN) 300 MG capsule Take 1 capsule (300 mg total) by mouth at bedtime. Patient taking differently: Take 900 mg by mouth at bedtime.  09/12/16  Yes Rosita Fire, MD  insulin NPH Human (NOVOLIN N) 100 UNIT/ML injection Inject 0.25 mLs (25 Units total) every morning into the skin. And syringes 1/day Patient taking differently: Inject 30 Units into the skin every morning. And syringes 1/day 03/01/17  Yes Renato Shin, MD  labetalol (NORMODYNE) 300 MG tablet Take 300 mg by mouth See admin instructions. Take 300mg  tablet at bedtime on diaylsis days M, W, F. Take 300mg  tablet twice daily on all other days 08/25/16  Yes [provider]  metoCLOPramide (REGLAN) 5 MG tablet Take 5 mg by mouth 3 (three) times daily with meals.    Yes [provider]  ondansetron (ZOFRAN) 4 MG tablet Take 1 tablet (4 mg total) by mouth every 8 (eight) hours as needed for nausea or vomiting. 09/12/16  Yes Rosita Fire, MD  promethazine (PHENERGAN) 25 MG tablet Take 1 tablet (25 mg total) by mouth every 8 (eight) hours as needed for nausea or vomiting. 09/12/16  Yes Rosita Fire, MD  rosuvastatin (CRESTOR) 10 MG tablet Take 10 mg by mouth at bedtime. 03/30/17  Yes [provider]  sevelamer carbonate (RENVELA) 800 MG tablet Take 800 mg by mouth 3 (three) times daily with meals.   Yes [provider]  warfarin (COUMADIN) 5 MG tablet Take 1.5 tablets (7.5 mg total) by mouth one time only at 6 PM. Patient taking differently: Take 7.5-10 mg by mouth See admin instructions. Take 10 mg Monday, wed, Friday and Saturday  Sunday Takes 7.5 mg   Thursday, tuesday 01/25/17 04/27/17  Melanee Spry, MD    Allergies  Allergen Reactions  . No Known Allergies     Physical Exam  Vitals  Blood pressure (!) 153/97, pulse 85, temperature 99 F (37.2 C), temperature source Oral, resp. rate 12, last menstrual period 07/17/2016, SpO2 100 %.   1. General well-developed, well-nourished female, extremely pleasant  2. Normal affect and insight, Not Suicidal or Homicidal, Awake Alert, Oriented X  3.  3. No F.N deficits, grossly, patient moving all extremities.  4. Ears and Eyes appear Normal, Conjunctivae clear, PERRLA. Moist Oral Mucosa.  5. Supple Neck, No JVD, No cervical lymphadenopathy appriciated, No Carotid Bruits.  6. Symmetrical Chest wall movement, Good air movement bilaterally, CTAB.  7. RRR, No Gallops, Rubs or Murmurs, No Parasternal Heave.  8. Positive Bowel Sounds, Abdomen Soft, Non tender, No organomegaly appriciated,No rebound -guarding or rigidity.  9.  No Cyanosis, Normal Skin Turgor, No Skin Rash or Bruise.  10. Good muscle tone,  joints appear normal , no effusions, Normal ROM.    Data Review  CBC Recent Labs  Lab 05/04/17 1517  WBC 6.4  HGB 11.1*  HCT 33.7*  PLT 214  MCV 91.1  MCH 30.0  MCHC 32.9  RDW 17.0*   ------------------------------------------------------------------------------------------------------------------  Chemistries  Recent Labs  Lab 05/04/17 1517  NA 125*  K 5.0  CL 87*  CO2 23  GLUCOSE 706*  BUN 37*  CREATININE 9.02*  CALCIUM 8.5*   ------------------------------------------------------------------------------------------------------------------ CrCl cannot be calculated (Unknown ideal weight.). ------------------------------------------------------------------------------------------------------------------ No results for input(s): TSH, T4TOTAL, T3FREE, THYROIDAB in the last 72 hours.  Invalid input(s): FREET3   Coagulation  profile No results for input(s): INR, PROTIME in the last 168 hours. ------------------------------------------------------------------------------------------------------------------- No results for input(s): DDIMER in the last 72 hours. -------------------------------------------------------------------------------------------------------------------  Cardiac Enzymes No results for input(s): CKMB, TROPONINI, MYOGLOBIN in the last 168 hours.  Invalid input(s): CK ------------------------------------------------------------------------------------------------------------------ Invalid input(s): POCBNP   ---------------------------------------------------------------------------------------------------------------  Urinalysis    Component Value Date/Time   COLORURINE YELLOW 05/04/2017 2030   [place] 05/04/2017 2030   LABSPEC 1.018 05/04/2017 2030   PHURINE 8.0 05/04/2017 2030   GLUCOSEU >=500 (A) 05/04/2017 2030   HGBUR SMALL (A) 05/04/2017 2030   HGBUR large 05/25/2009 0900   BILIRUBINUR NEGATIVE 05/04/2017 2030   BILIRUBINUR neg 10/23/2012 1810   KETONESUR NEGATIVE 05/04/2017 2030   PROTEINUR >=300 (A) 05/04/2017 2030   UROBILINOGEN 0.2 04/16/2013 2155   NITRITE NEGATIVE 05/04/2017 2030   LEUKOCYTESUR NEGATIVE 05/04/2017 2030    ----------------------------------------------------------------------------------------------------------------   Imaging results:   Dg Chest 2 View  Result Date: 05/04/2017 CLINICAL DATA:  Chest pain EXAM: CHEST  2 VIEW COMPARISON:  09/07/2016 FINDINGS: The heart remains borderline enlarged. Normal vascularity. Lungs clear. Right jugular tunneled dialysis catheter is stable with its tip at the cavoatrial junction. No pneumothorax. No pleural effusion. IMPRESSION: No active cardiopulmonary disease. Electronically Signed   By: Marybelle Killings M.D.   On: 05/04/2017 16:14      Assessment & Plan  1. Chest pain 2. Hyperglycemia already  improving with blood sugar of 321 from 706 3. End-stage renal disease on hemodialysis 4. 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 Continue with Coumadin Check CT of the chest in a.m. to evaluate dialysis catheter thrombosis    DVT Prophylaxis Coumadin  AM Labs Ordered, also please review Full Orders    Code Status full  Disposition Plan: Home  Time spent in minutes : 42 minutes  Condition GUARDED   @SIGNATURE @

## 2017-05-04 NOTE — ED Notes (Signed)
Pharmacy notified for insulin

## 2017-05-04 NOTE — ED Triage Notes (Addendum)
Dialysis pt, last treatment was wed. Having mid to right side chest pain today, describes it as palpitations. Is pain free on arrival. Reports being treated for blood clot vs tumor in her heart.

## 2017-05-04 NOTE — ED Notes (Signed)
Notified jeff, pa of glucose 706.

## 2017-05-04 NOTE — ED Provider Notes (Signed)
[place] EMERGENCY DEPARTMENT Provider Note   CSN: [id number] Arrival date & time: 05/04/17  1503     History   Chief Complaint Chief Complaint  Patient presents with  . Chest Pain  . Palpitations    HPI CAHTERINE [name] is a 37 y.o. female.   Palpitations   This is a recurrent problem. The current episode started 6 to 12 hours ago. The problem has been resolved. Associated with: waking. Associated symptoms include chest pain, nausea (chronic) and vomiting (chronic). Pertinent negatives include no diaphoresis, no fever, no chest pressure, no exertional chest pressure, no abdominal pain, no back pain, no dizziness, no cough, no shortness of breath and no sputum production. She has tried nothing for the symptoms. Risk factors include diabetes mellitus. 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    Past Medical History:  Diagnosis Date  . Anemia   . Diabetic neuropathy ([place])   . Diabetic retinopathy ([place])   . ESRD (end stage renal disease) on dialysis [facility])    "MWF; [place]" (02/02/2017)  . Gastroparesis   . HA (headache)    "qd if I didn't take my RX" (02/02/2017)  . Hemodialysis access site with arteriovenous graft ([place])   . Hypertension   . Neuropathy   . Trigeminal neuralgia   . Type I diabetes mellitus [facility])     Patient Active Problem List   Diagnosis Date Noted  . ESRD (end stage renal disease) ([place]) 02/01/2017  . 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 Pregnancy test positive 12/22/2016  . Amenorrhea 12/12/2016  . Diabetes ([place]) 09/16/2016  . ESRD on hemodialysis ([place])   . Gastroparesis   . Intractable vomiting 09/04/2016  . 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 Intractable vomiting with nausea 04/04/2015  . AKI (acute kidney injury) ([place]) 04/04/2015  . DKA (diabetic ketoacidoses) ([place]) 04/03/2015  . Trigeminal neuralgia 12/12/2013  . HA (headache)   . DKA, type 2 ([place]) 10/24/2012  . Contact dermatitis  10/24/2012  . SORE THROAT 03/22/2010  . 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 FATIGUE 05/25/2009  . VAGINAL DISCHARGE 01/28/2009  . Carbuncle and furuncle of unspecified site 12/29/2008  . HYPERCHOLESTEROLEMIA 06/12/2008  . MICROALBUMINURIA 06/12/2008  . MONILIAL VAGINITIS 11/14/2007  . INGUINAL PAIN, BILATERAL 11/14/2007  . DYSURIA 10/17/2007  . CYSTITIS 09/05/2007  . DELAYED MENSES 09/05/2007  . RHINOCONJUNCTIVITIS, ALLERGIC 07/19/2007  . DENTAL PAIN 03/28/2007  . HYPERLIPIDEMIA 09/01/2006  . PELVIC INFLAMMATORY DISEASE 09/01/2006  . ABORTION, SPONTANEOUS 09/01/2006  . ABSCESS 09/01/2006  . HX, PERSONAL, PAST NONCOMPLIANCE 09/01/2006    Past Surgical History:  Procedure Laterality Date  . AV FISTULA PLACEMENT Left 09/07/2016   Procedure: Left arm 1st Stage Basilic AV Fistula;  Surgeon: [name], MD;  Location: [facility] OR;  Service: Vascular;  Laterality: Left;  . DILATION AND CURETTAGE OF UTERUS    . EXCISIONAL HEMORRHOIDECTOMY    . EYE SURGERY    . INSERTION OF DIALYSIS CATHETER Right 09/07/2016   Procedure: INSERTION OF DIALYSIS CATHETER;  Surgeon: [name], MD;  Location: [facility] OR;  Service: Vascular;  Laterality: Right;  . RETINAL LASER PROCEDURE Bilateral    "related to diabetes"  . REVISON OF ARTERIOVENOUS FISTULA Left 01/15/2017   Procedure: INSERTION OF  AV GORE-TEX GRAFT;  Surgeon: Elam Dutch, MD;  Location: [place];  Service: Vascular;  Laterality: Left;  . TEE WITHOUT CARDIOVERSION N/A 01/11/2017   Procedure: TRANSESOPHAGEAL ECHOCARDIOGRAM (TEE);  Surgeon:  Jerline Pain, MD;  Location: [facility];  Service: Cardiovascular;  Laterality: N/A;    OB History    Gravida Para Term Preterm AB Living   7 1   1 5 1    SAB TAB Ectopic Multiple Live Births   2 3             Home Medications    Prior to Admission medications   Medication Sig Start Date End Date Taking? Authorizing Provider  carbamazepine (TEGRETOL) 200 MG tablet Take 200 tablets by mouth daily.  01/03/17   [provider]  diclofenac sodium (VOLTAREN) 1 % GEL Apply 2 g topically 4 (four) times daily. Patient not taking: Reported on 03/01/2017 01/24/17   Melanee Spry, MD  diphenhydramine-acetaminophen (TYLENOL PM) 25-500 MG TABS tablet Take 2 tablets by mouth at bedtime.    [provider]  gabapentin (NEURONTIN) 300 MG capsule Take 1 capsule (300 mg total) by mouth at bedtime. Patient taking differently: Take 300 mg by mouth 3 (three) times daily.  09/12/16   Rosita Fire, MD  insulin NPH Human (NOVOLIN N) 100 UNIT/ML injection Inject 0.25 mLs (25 Units total) every morning into the skin. And syringes 1/day 03/01/17   Renato Shin, MD  labetalol (NORMODYNE) 300 MG tablet Take 300 mg by mouth See admin instructions. Take 300mg  tablet at bedtime on diaylsis days M, W, F. Take 300mg  tablet twice daily on all other days 08/25/16   [provider]  metoCLOPramide (REGLAN) 5 MG tablet Take 5 mg by mouth every 6 (six) hours as needed (nausea).     [provider]  ondansetron (ZOFRAN) 4 MG tablet Take 1 tablet (4 mg total) by mouth every 8 (eight) hours as needed for nausea or vomiting. 09/12/16   Rosita Fire, MD  promethazine (PHENERGAN) 25 MG tablet Take 1 tablet (25 mg total) by mouth every 8 (eight) hours as needed for nausea or vomiting. 09/12/16   Rosita Fire, MD  sevelamer carbonate (RENVELA) 800 MG tablet Take 800 mg by mouth 3 (three) times daily with meals.    [provider]  warfarin (COUMADIN) 5 MG tablet Take 1.5 tablets (7.5 mg total) by mouth one time only at 6 PM. Patient taking differently: Take 7.5 mg by mouth one time only at 6 PM. 7.5mg  and 10mg  QOD 01/25/17 04/27/17  Lacroce, Hulen Shouts, MD    Family History Family History  Problem Relation Age of Onset  . Hyperlipidemia Mother   . Asthma Daughter   . Diabetes Maternal Grandmother   . Stroke Maternal Grandmother   . Kidney disease Maternal  Grandfather   . Kidney disease Paternal Grandmother     Social History Social History   Tobacco Use  . Smoking status: Never Smoker  . Smokeless tobacco: Never Used  Substance Use Topics  . Alcohol use: Yes    Alcohol/week: 2.0 oz    Types: 4 Standard drinks or equivalent per week    Comment: 02/02/2017 "nothing in the last 6 months; never had problem w/it"  . Drug use: No     Allergies   No known allergies   Review of Systems Review of Systems  Constitutional: Negative for chills, diaphoresis and fever.  HENT: Negative for ear pain and sore throat.   Eyes: Negative for pain and visual disturbance.  Respiratory: Negative for cough, sputum production and shortness of breath.   Cardiovascular: Positive for chest pain and palpitations.  Gastrointestinal: Positive for nausea (chronic) and  vomiting (chronic). Negative for abdominal pain.  Genitourinary: Negative for dysuria and hematuria.  Musculoskeletal: Negative for arthralgias and back pain.  Skin: Negative for color change and rash.  Neurological: Negative for dizziness, seizures and syncope.  All other systems reviewed and are negative.    Physical Exam Updated Vital Signs BP (!) 177/105 (BP Location: Right Arm)   Pulse 90   Temp 99.4 F (37.4 C) (Oral)   Resp 18   LMP 07/17/2016 (Within Weeks) Comment: Has not had a period since starting dialysis in May  SpO2 98%   Physical Exam  Constitutional: She is oriented to person, place, and time. She appears well-developed and well-nourished. No distress.  HENT:  Head: Normocephalic and atraumatic.  Eyes: Conjunctivae and EOM are normal.  Neck: Neck supple.  Cardiovascular: Normal rate and regular rhythm.  Pulmonary/Chest: Effort normal and breath sounds normal. No respiratory distress.  Abdominal: Soft. There is no tenderness.  Musculoskeletal: She exhibits no edema.  Neurological: She is alert and oriented to person, place, and time.  Skin: Skin is warm and  dry.  Psychiatric: She has a normal mood and affect.  Nursing note and vitals reviewed.    ED Treatments / Results  Labs (all labs ordered are listed, but only abnormal results are displayed) Labs Reviewed  BASIC METABOLIC PANEL - Abnormal; Notable for the following components:      Result Value   Sodium 125 (*)    Chloride 87 (*)    Glucose, Bld 706 (*)    BUN 37 (*)    Creatinine, Ser 9.02 (*)    Calcium 8.5 (*)    GFR calc non Af Amer 5 (*)    GFR calc Af Amer 6 (*)    All other components within normal limits  CBC - Abnormal; Notable for the following components:   RBC 3.70 (*)    Hemoglobin 11.1 (*)    HCT 33.7 (*)    RDW 17.0 (*)    All other components within normal limits  I-STAT TROPONIN, ED  I-STAT BETA HCG BLOOD, ED ([facility], [facility], [facility] ONLY)    EKG  EKG Interpretation None       Radiology Dg Chest 2 View  Result Date: 05/04/2017 CLINICAL DATA:  Chest pain EXAM: CHEST  2 VIEW COMPARISON:  09/07/2016 FINDINGS: The heart remains borderline enlarged. Normal vascularity. Lungs clear. Right jugular tunneled dialysis catheter is stable with its tip at the cavoatrial junction. No pneumothorax. No pleural effusion. IMPRESSION: No active cardiopulmonary disease. Electronically Signed   By: Marybelle Killings M.D.   On: 05/04/2017 16:14    Procedures Procedures (including critical care time)  Medications Ordered in ED Medications - No data to display   Initial Impression / Assessment and Plan / ED Course  I have reviewed the triage vital signs and the nursing notes.  Pertinent labs & imaging results that were available during my care of the patient were reviewed by me and considered in my medical decision making (see chart for details).     Ms. Seier is a 37 year old female with past medical history significant for diabetes, end-stage renal disease on dialysis, gastroparesis, vascular catheter with thrombus who presents for palpitations, nausea and vomiting.  EKG obtained  demonstrates normal sinus rhythm with nonspecific ST-T changes.  Agreed chest x-ray obtained, personally reviewed by me, demonstrates borderline cardiomegaly without acute cardiac or pulmonary processes.  Labs obtained and significant for anemia improved from baseline, anion gap of 15, elevated glucose of 700,  with creatinine of 9, near baseline.  Negative delta troponin and negative beta hydroxybutyrate.  VBG demonstrates no acidosis.  Considering diabetic ketoacidosis, but evidence of normal pH indicates that this is unlikely at this time.  Patient is given subcutaneous insulin and blood sugars trended down appropriately.  Given patient's high risk for cardiac pathology, will admit for further cardiac work up as well as hyperglycemia.  Patient is admitted to hospitalist.  Final Clinical Impressions(s) / ED Diagnoses   Final diagnoses:  Palpitations  Chest pain, unspecified type  Hyperglycemia    ED Discharge Orders    None       Elveria Rising, MD 05/04/17 Joie Bimler    Duffy Bruce, MD 05/05/17 1102

## 2017-05-05 ENCOUNTER — Inpatient Hospital Stay (HOSPITAL_COMMUNITY): Payer: BLUE CROSS/BLUE SHIELD

## 2017-05-05 ENCOUNTER — Encounter (HOSPITAL_COMMUNITY): Payer: Self-pay | Admitting: Radiology

## 2017-05-05 DIAGNOSIS — R0789 Other chest pain: Secondary | ICD-10-CM

## 2017-05-05 LAB — CBG MONITORING, ED
GLUCOSE-CAPILLARY: 211 mg/dL — AB (ref 65–99)
GLUCOSE-CAPILLARY: 278 mg/dL — AB (ref 65–99)
Glucose-Capillary: 271 mg/dL — ABNORMAL HIGH (ref 65–99)

## 2017-05-05 LAB — TROPONIN I: Troponin I: <0.03 ng/mL (ref <0.03)

## 2017-05-05 LAB — GLUCOSE, CAPILLARY
GLUCOSE-CAPILLARY: 263 mg/dL — AB (ref 65–99)
GLUCOSE-CAPILLARY: 155 mg/dL — AB (ref 65–99)

## 2017-05-05 LAB — PROTIME-INR
INR: 2.17
INR: 2.19
Prothrombin Time: 24 seconds — ABNORMAL HIGH (ref 11.4–15.2)
Prothrombin Time: 24.2 seconds — ABNORMAL HIGH (ref 11.4–15.2)

## 2017-05-05 MED ORDER — IOPAMIDOL (ISOVUE-300) INJECTION 61%
INTRAVENOUS | Status: AC
  Administered 2017-05-05: 75 mL
  Filled 2017-05-05: qty 75

## 2017-05-05 MED ORDER — ONDANSETRON HCL 4 MG/2ML IJ SOLN
4.0000 mg | Freq: Four times a day (QID) | INTRAMUSCULAR | Status: DC | PRN
  Administered 2017-05-05 – 2017-05-07 (×3): 4 mg via INTRAVENOUS
  Filled 2017-05-05: qty 2

## 2017-05-05 MED ORDER — LABETALOL HCL 200 MG PO TABS
400.0000 mg | ORAL_TABLET | Freq: Once | ORAL | Status: AC
  Administered 2017-05-05: 400 mg via ORAL
  Filled 2017-05-05: qty 2

## 2017-05-05 MED ORDER — ONDANSETRON HCL 4 MG PO TABS
4.0000 mg | ORAL_TABLET | Freq: Four times a day (QID) | ORAL | Status: DC | PRN
  Administered 2017-05-07: 4 mg via ORAL
  Filled 2017-05-05: qty 1

## 2017-05-05 MED ORDER — INSULIN ASPART 100 UNIT/ML ~~LOC~~ SOLN
0.0000 [IU] | Freq: Three times a day (TID) | SUBCUTANEOUS | Status: DC
  Administered 2017-05-05 (×2): 5 [IU] via SUBCUTANEOUS
  Administered 2017-05-06: 3 [IU] via SUBCUTANEOUS
  Administered 2017-05-06: 1 [IU] via SUBCUTANEOUS
  Administered 2017-05-07: 3 [IU] via SUBCUTANEOUS
  Administered 2017-05-07: 2 [IU] via SUBCUTANEOUS
  Filled 2017-05-05: qty 1

## 2017-05-05 MED ORDER — WARFARIN SODIUM 10 MG PO TABS
10.0000 mg | ORAL_TABLET | Freq: Once | ORAL | Status: AC
  Administered 2017-05-05: 10 mg via ORAL
  Filled 2017-05-05: qty 1

## 2017-05-05 MED ORDER — LABETALOL HCL 200 MG PO TABS: 300.0000 mg | ORAL_TABLET | ORAL | Status: DC

## 2017-05-05 MED ORDER — METOCLOPRAMIDE HCL 5 MG PO TABS
5.0000 mg | ORAL_TABLET | Freq: Three times a day (TID) | ORAL | Status: DC
  Administered 2017-05-05 – 2017-05-07 (×7): 5 mg via ORAL
  Filled 2017-05-05 (×7): qty 1

## 2017-05-05 MED ORDER — ACETAMINOPHEN 325 MG PO TABS: 650.0000 mg | ORAL_TABLET | Freq: Four times a day (QID) | ORAL | Status: DC | PRN

## 2017-05-05 MED ORDER — ZOLPIDEM TARTRATE 5 MG PO TABS
5.0000 mg | ORAL_TABLET | Freq: Every evening | ORAL | Status: DC | PRN
  Administered 2017-05-07: 5 mg via ORAL
  Filled 2017-05-05: qty 1

## 2017-05-05 MED ORDER — ASPIRIN EC 81 MG PO TBEC
81.0000 mg | DELAYED_RELEASE_TABLET | Freq: Every day | ORAL | Status: DC
  Administered 2017-05-05 – 2017-05-07 (×3): 81 mg via ORAL
  Filled 2017-05-05 (×3): qty 1

## 2017-05-05 MED ORDER — HYDROCODONE-ACETAMINOPHEN 5-325 MG PO TABS: 1.0000 | ORAL_TABLET | ORAL | Status: DC | PRN

## 2017-05-05 MED ORDER — INSULIN NPH (HUMAN) (ISOPHANE) 100 UNIT/ML ~~LOC~~ SUSP
25.0000 [IU] | Freq: Two times a day (BID) | SUBCUTANEOUS | Status: DC
  Administered 2017-05-05 – 2017-05-07 (×3): 25 [IU] via SUBCUTANEOUS
  Filled 2017-05-05 (×4): qty 10

## 2017-05-05 MED ORDER — ONDANSETRON HCL 4 MG PO TABS: 4.0000 mg | ORAL_TABLET | Freq: Three times a day (TID) | ORAL | Status: DC | PRN

## 2017-05-05 MED ORDER — GABAPENTIN 300 MG PO CAPS
900.0000 mg | ORAL_CAPSULE | Freq: Every day | ORAL | Status: DC
  Administered 2017-05-05 – 2017-05-06 (×3): 900 mg via ORAL
  Filled 2017-05-05 (×3): qty 3

## 2017-05-05 MED ORDER — SODIUM CHLORIDE 0.9% FLUSH: 3.0000 mL | INTRAVENOUS | Status: DC | PRN

## 2017-05-05 MED ORDER — PROMETHAZINE HCL 25 MG PO TABS
25.0000 mg | ORAL_TABLET | Freq: Three times a day (TID) | ORAL | Status: DC | PRN
  Administered 2017-05-05 – 2017-05-07 (×4): 25 mg via ORAL
  Filled 2017-05-05 (×4): qty 1

## 2017-05-05 MED ORDER — HYDRALAZINE HCL 20 MG/ML IJ SOLN
50.0000 mg | Freq: Four times a day (QID) | INTRAMUSCULAR | Status: DC | PRN
  Filled 2017-05-05: qty 3

## 2017-05-05 MED ORDER — PROMETHAZINE HCL 25 MG PO TABS: 25.0000 mg | ORAL_TABLET | Freq: Three times a day (TID) | ORAL | Status: DC | PRN

## 2017-05-05 MED ORDER — LABETALOL HCL 200 MG PO TABS
300.0000 mg | ORAL_TABLET | Freq: Every day | ORAL | Status: DC
  Administered 2017-05-06: 21:00:00 300 mg via ORAL
  Filled 2017-05-05 (×2): qty 1

## 2017-05-05 MED ORDER — LABETALOL HCL 200 MG PO TABS: 300.0000 mg | ORAL_TABLET | Freq: Two times a day (BID) | ORAL | Status: DC

## 2017-05-05 MED ORDER — INSULIN ASPART 100 UNIT/ML ~~LOC~~ SOLN
0.0000 [IU] | Freq: Every day | SUBCUTANEOUS | Status: DC
  Administered 2017-05-05 – 2017-05-06 (×2): 2 [IU] via SUBCUTANEOUS
  Filled 2017-05-05: qty 1

## 2017-05-05 MED ORDER — SEVELAMER CARBONATE 800 MG PO TABS
800.0000 mg | ORAL_TABLET | Freq: Three times a day (TID) | ORAL | Status: DC
  Administered 2017-05-05 – 2017-05-07 (×7): 800 mg via ORAL
  Filled 2017-05-05 (×8): qty 1

## 2017-05-05 MED ORDER — WARFARIN - PHARMACIST DOSING INPATIENT
Freq: Every day | Status: DC
  Administered 2017-05-06: 18:00:00

## 2017-05-05 MED ORDER — SODIUM CHLORIDE 0.9% FLUSH
3.0000 mL | Freq: Two times a day (BID) | INTRAVENOUS | Status: DC
  Administered 2017-05-05 – 2017-05-07 (×4): 3 mL via INTRAVENOUS

## 2017-05-05 MED ORDER — RENA-VITE PO TABS
1.0000 | ORAL_TABLET | Freq: Every day | ORAL | Status: DC
  Administered 2017-05-05 – 2017-05-06 (×2): 1 via ORAL
  Filled 2017-05-05 (×2): qty 1

## 2017-05-05 MED ORDER — LABETALOL HCL 200 MG PO TABS: 400.0000 mg | ORAL_TABLET | Freq: Once | ORAL | Status: DC

## 2017-05-05 MED ORDER — ROSUVASTATIN CALCIUM 10 MG PO TABS
10.0000 mg | ORAL_TABLET | Freq: Every day | ORAL | Status: DC
  Administered 2017-05-05 – 2017-05-06 (×3): 10 mg via ORAL
  Filled 2017-05-05 (×3): qty 1

## 2017-05-05 MED ORDER — SODIUM CHLORIDE 0.9 % IV SOLN
250.0000 mL | INTRAVENOUS | Status: DC | PRN
Start: 2017-05-05 — End: 2017-05-07

## 2017-05-05 MED ORDER — CARBAMAZEPINE 200 MG PO TABS
200.0000 mg | ORAL_TABLET | Freq: Every day | ORAL | Status: DC
  Administered 2017-05-05 – 2017-05-06 (×3): 200 mg via ORAL
  Filled 2017-05-05 (×3): qty 1

## 2017-05-05 MED ORDER — ACETAMINOPHEN 650 MG RE SUPP: 650.0000 mg | Freq: Four times a day (QID) | RECTAL | Status: DC | PRN

## 2017-05-05 NOTE — ED Notes (Signed)
Attempted x 2 for report

## 2017-05-05 NOTE — ED Notes (Signed)
Pt eating lunch

## 2017-05-05 NOTE — Progress Notes (Signed)
ANTICOAGULATION CONSULT NOTE - Initial Consult  Pharmacy Consult for Warfarin  Indication: HD cath thrombus  Allergies  Allergen Reactions  . No Known Allergies     Vital Signs: Temp: 98.5 F (36.9 C) (01/19 1343) Temp Source: Oral (01/19 1343) BP: 172/109 (01/19 1404) Pulse Rate: 85 (01/19 1230)  Labs: Recent Labs    05/04/17 1517 05/04/17 2358 05/05/17 0154 05/05/17 1003  HGB 11.1*  --   --   --   HCT 33.7*  --   --   --   PLT 214  --   --   --   LABPROT  --  24.2* 24.0*  --   INR  --  2.19 2.17  --   CREATININE 9.02*  --   --   --   TROPONINI  --   --  <0.03 <0.03    Estimated Creatinine Clearance: 10.2 mL/min (A) (by C-G formula based on SCr of 9.02 mg/dL (H)).   Medical History: Past Medical History:  Diagnosis Date  . Anemia   . Diabetic neuropathy ([place])   . Diabetic retinopathy ([place])   . ESRD (end stage renal disease) on dialysis [facility])    "MWF; [place]" (02/02/2017)  . Gastroparesis   . HA (headache)    "qd if I didn't take my RX" (02/02/2017)  . Hemodialysis access site with arteriovenous graft ([place])   . Hypertension   . Neuropathy   . Renal insufficiency   . Trigeminal neuralgia   . 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 Pt presents to the ED with CP/palpitations. INR is therapeutic at 2.17. Hgb 11.1, plt 214.   PTA dose: 7.5mg  on T/Thurs, 7.5mg  all other days.   Goal of Therapy:  INR 2-3 Monitor platelets by anticoagulation protocol: Yes   Plan:  -Warfarin 10 mg PO x 1 at 1800 tonight  -Daily PT/INR -Monitor for bleeding  Jalene Mullet, Pharm.D. PGY1 Pharmacy Resident 05/05/2017 3:08 PM Main Pharmacy: [phone]

## 2017-05-05 NOTE — Progress Notes (Signed)
[facility]                                                                              Patient Demographics  Carrie [name], is a 37 y.o. female, DOB - 01/21/1981, LKT:[id number]  Admit date - 05/04/2017   Admitting Physician No admitting provider for patient encounter.  Outpatient Primary MD for the patient is Glendon Axe, MD  Outpatient specialists:   LOS - 1  days    Chief Complaint  Patient presents with  . Chest Pain  . Palpitations       Brief summary  Carrie [name]  is a 37 y.o. 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  Patient was very hypoglycemic but not in DKA on evaluation in the ED, he was admitted for further management     Assessment & Plan    Active Problems:   Chest pain  #1 chest pain: Atypical ?  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 Chest CT negative Serial cardiac enzymes negative 2D echocardiogram pending  #2 hyperglycemia: Continue insulin with monitoring of glycemic status and control Improving  #3 hypertension: Resume hypertensive regimen Adjust as needed for BP control  #4 history of renal disease on hemodialysis: Nephrology consulted for routine dialysis  Code Status: Full code  DVT Prophylaxis: On warfarin Family Communication: Discussed in detail with the patient, all imaging results, lab results explained to the patient   Disposition Plan: Home  Time Spent in minutes  31 minutes  Procedures:    Consultants:   Nephrology  Antimicrobials:      Medications  Scheduled Meds: . aspirin EC  81 mg Oral Daily  . carbamazepine  200 mg Oral QHS  . gabapentin  900 mg Oral QHS  . insulin aspart  0-5 Units Subcutaneous QHS  . insulin aspart  0-9  Units Subcutaneous TID WC  . insulin NPH Human  25 Units Subcutaneous BID AC & HS  . labetalol  300 mg Oral See admin instructions  . metoCLOPramide  5 mg Oral TID WC  . rosuvastatin  10 mg Oral QHS  . sevelamer carbonate  800 mg Oral TID WC  . sodium chloride flush  3 mL Intravenous Q12H  . Warfarin - Pharmacist Dosing Inpatient   Does not apply q1800   Continuous Infusions: . sodium chloride     PRN Meds:.sodium chloride, acetaminophen **OR** acetaminophen, HYDROcodone-acetaminophen, ondansetron **OR** ondansetron (ZOFRAN) IV, ondansetron, promethazine, sodium chloride flush, zolpidem   Antibiotics   Anti-infectives (From admission, onward)   None        Subjective:   Carrie [name] was seen and examined today.  Patient denies dizziness, chest pain is resolved    Objective:   Vitals:   05/05/17 0115 05/05/17 0645 05/05/17 0730 05/05/17 0836  BP: (!) 180/97 (!) 188/118 (!) 178/95 (!) 159/92  Pulse: 81 84 84 87  Resp: 11 16 13 18   Temp:    98.2 F (36.8 C)  TempSrc:  Oral  SpO2: 99% 100% 100% 97%   No intake or output data in the 24 hours ending 05/05/17 1106   Wt Readings from Last 3 Encounters:  03/01/17 89.1 kg (196 lb 6.4 oz)  02/15/17 88 kg (194 lb)  02/04/17 86.3 kg (190 lb 4.1 oz)     Exam  General: NAD  HEENT: NCAT,  PERRL,MMM  Neck: SUPPLE, (-) JVD  Cardiovascular: RRR, (-) GALLOP, (-) MURMUR  Respiratory: CTA  Gastrointestinal: SOFT, (-) DISTENSION, BS(+), (_) TENDERNESS  Ext: (-) CYANOSIS, (-) EDEMA  Neuro: A, OX 3  Skin:(-) RASH  Psych:NORMAL AFFECT/MOOD   Data Reviewed:  I have personally reviewed following labs and imaging studies  Micro Results No results found for this or any previous visit (from the past 240 hour(s)).  Radiology Reports Dg Chest 2 View  Result Date: 05/04/2017 CLINICAL DATA:  Chest pain EXAM: CHEST  2 VIEW COMPARISON:  09/07/2016 FINDINGS: The heart remains borderline enlarged. Normal vascularity.  Lungs clear. Right jugular tunneled dialysis catheter is stable with its tip at the cavoatrial junction. No pneumothorax. No pleural effusion. IMPRESSION: No active cardiopulmonary disease. Electronically Signed   By: Marybelle Killings M.D.   On: 05/04/2017 16:14   Ct Chest W Contrast  Result Date: 05/05/2017 CLINICAL DATA:  37 year old female dialysis patient. Central and right side chest pain since yesterday with palpitations. 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 Last dialyzed 3 days ago. 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 EXAM: CT CHEST WITH CONTRAST TECHNIQUE: Multidetector CT imaging of the chest was performed during intravenous contrast administration. CONTRAST:  71mL ISOVUE-300 IOPAMIDOL (ISOVUE-300) INJECTION 61% COMPARISON:  CTA chest 01/14/2017. Report of transesophageal echocardiogram 01/11/2017. Repeat echocardiogram pending. FINDINGS: Cardiovascular: It appears the right upper extremity was injected for this exam. Right IJ approach tunneled dialysis catheter. The right IJ, right subclavian vein, right innominate vein and SVC appear patent without evidence of thrombus. There is subtle and indistinct hypodensity along the posterior aspect of the right atrium near the confluence with the IVC (series 5, image 54), but this could be contrast mixing artifact as the IVC is not opacified. No other right atrium filling defect. Pulsation artifact at the tips of the dialysis catheter. Stable cardiomegaly. No pericardial effusion. The other cardiac chambers and central mediastinal vascular structures are enhancing and appear to be patent. There is aortic dominant contrast timing, the bilateral pulmonary arteries are not well evaluated. No aortic atherosclerosis. No definite calcified coronary artery atherosclerosis. Mediastinum/Nodes: Negative.  No mediastinal lymphadenopathy. Lungs/Pleura: Major airways are patent and lung volumes are improved compared to the September CT  chronic mild anterior basal segment left lower lobe scarring or atelectasis. No pleural effusion or other pulmonary opacity. Upper Abdomen: Negative visible liver, gallbladder, spleen, pancreas, adrenal glands, and bowel in the upper abdomen. Visible kidneys may be mildly hypoenhancing, but otherwise appear normal. Musculoskeletal: No acute osseous abnormality identified. Mildly heterogeneous bone mineralization probably due to renal osteodystrophy. IMPRESSION: 1. Negative for thrombus along the course of the right IJ dialysis catheter. Right IJ, innominate vein, and SVC are patent. 2. Poor CT correlation of the 2 cm right atrial mass described on TEE in 2018; questionable low-density filling defect along the posterior right atrium near the confluence with the IVC, but this could simply be mixing artifact from unopacified contrast. Recommend attention on repeat Echocardiogram. 3. Otherwise no acute findings and largely unremarkable chest CT. Electronically Signed   By: Genevie Ann M.D.   On: 05/05/2017 08:26  Lab Data:  CBC: Recent Labs  Lab 05/04/17 1517  WBC 6.4  HGB 11.1*  HCT 33.7*  MCV 91.1  PLT 017   Basic Metabolic Panel: Recent Labs  Lab 05/04/17 1517  NA 125*  K 5.0  CL 87*  CO2 23  GLUCOSE 706*  BUN 37*  CREATININE 9.02*  CALCIUM 8.5*   GFR: CrCl cannot be calculated (Unknown ideal weight.). Liver Function Tests: No results for input(s): AST, ALT, ALKPHOS, BILITOT, PROT, ALBUMIN in the last 168 hours. No results for input(s): LIPASE, AMYLASE in the last 168 hours. No results for input(s): AMMONIA in the last 168 hours. Coagulation Profile: Recent Labs  Lab 05/04/17 2358 05/05/17 0154  INR 2.19 2.17   Cardiac Enzymes: Recent Labs  Lab 05/05/17 0154 05/05/17 1003  TROPONINI <0.03 <0.03   BNP (last 3 results) No results for input(s): PROBNP in the last 8760 hours. HbA1C: No results for input(s): HGBA1C in the last 72 hours. CBG: Recent Labs  Lab  05/04/17 2046 05/04/17 2153 05/04/17 2303 05/05/17 0103 05/05/17 0943  GLUCAP 427* 312* 257* 211* 271*   Lipid Profile: No results for input(s): CHOL, HDL, LDLCALC, TRIG, CHOLHDL, LDLDIRECT in the last 72 hours. Thyroid Function Tests: No results for input(s): TSH, T4TOTAL, FREET4, T3FREE, THYROIDAB in the last 72 hours. Anemia Panel: No results for input(s): VITAMINB12, FOLATE, FERRITIN, TIBC, IRON, RETICCTPCT in the last 72 hours. Urine analysis:    Component Value Date/Time   COLORURINE YELLOW 05/04/2017 2030   APPEARANCEUR CLEAR 05/04/2017 2030   LABSPEC 1.018 05/04/2017 2030   PHURINE 8.0 05/04/2017 2030   GLUCOSEU >=500 (A) 05/04/2017 2030   HGBUR SMALL (A) 05/04/2017 2030   HGBUR large 05/25/2009 0900   BILIRUBINUR NEGATIVE 05/04/2017 2030   BILIRUBINUR neg 10/23/2012 1810   KETONESUR NEGATIVE 05/04/2017 2030   PROTEINUR >=300 (A) 05/04/2017 2030   UROBILINOGEN 0.2 04/16/2013 2155   NITRITE NEGATIVE 05/04/2017 2030   LEUKOCYTESUR NEGATIVE 05/04/2017 2030     OSEI-BONSU,Ocia Simek M.D. [facility] 05/05/2017, 11:06 AM  Pager: [phone] Between 7am to 7pm - call Pager - [phone]  After 7pm go to [email or web address] - password [email or web address]  Call night coverage person covering after 7pm

## 2017-05-05 NOTE — Progress Notes (Signed)
Patient arrived to floor without any chest pain complaints. BP is running high- 202/113. HR 90- NSR. Paged MD Osei-Bonsu, verbally ordered labetalol 400 mg tablet form ONCE. Then continue home labetalol dosing. Medication orders placed by pharmacist. Scheduled for dialysis this evening. Will continue to monitor.

## 2017-05-05 NOTE — Consult Note (Signed)
[facility] Renal Consultation Note    Indication for Consultation:  Management of ESRD/hemodialysis, anemia, hypertension/volume, and secondary hyperparathyroidism. PCP:  HPI: Carrie [name] is a 37 y.o. 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  Pt reports CP started yesterday and was radiating throughout entire anterior chest. No SOB. + nausea (but this is chronic issue). No abdominal pain, fever or chills. Presented to ED; work-up showed labs Na 125, K 5, Glu 706, trop 0.02, WBC 6.4, Hgb 11.1. Chest CT performed to assess for possible PE, negative. Echo ordered to look for resolution of the R atrial thrombus, pending. Throwing up reg including yest am.  Usually dialyzes MWF at [facility] unit. Missed her HD yesterday. Has been using L forearm AVG without issues, still has R TDC in place (was told to keep in for 3 months until echo confirmed resolution of atrial clot). Denies CP at this time. BP high, but looks comfortable.  Past Medical History:  Diagnosis Date  . Anemia   . Diabetic neuropathy ([place])   . Diabetic retinopathy ([place])   . ESRD (end stage renal disease) on dialysis [facility])    "[place]WF; [place]" (02/02/2017)  . Gastroparesis   . HA (headache)    "qd if I didn't take my RX" (02/02/2017)  . Hemodialysis access site with arteriovenous graft ([place])   . Hypertension   . Neuropathy   . Renal insufficiency   . Trigeminal neuralgia   . Type I diabetes mellitus ([place])    Past Surgical History:  Procedure Laterality Date  . AV FISTULA PLACEMENT Left 09/07/2016   Procedure: Left arm 1st Stage Basilic AV Fistula;  Surgeon: [name], MD;  Location: [facility] OR;  Service: Vascular;  Laterality: Left;  . DILATION AND CURETTAGE OF UTERUS    . EXCISIONAL HEMORRHOIDECTOMY    . EYE SURGERY    . INSERTION OF DIALYSIS CATHETER Right 09/07/2016    Procedure: INSERTION OF DIALYSIS CATHETER;  Surgeon: [name], MD;  Location: [facility] OR;  Service: Vascular;  Laterality: Right;  . RETINAL LASER PROCEDURE Bilateral    "related to diabetes"  . REVISON OF ARTERIOVENOUS FISTULA Left 01/15/2017   Procedure: INSERTION OF  AV GORE-TEX GRAFT;  Surgeon: Elam Dutch, MD;  Location: [place];  Service: Vascular;  Laterality: Left;  . TEE WITHOUT CARDIOVERSION N/A 01/11/2017   Procedure: TRANSESOPHAGEAL ECHOCARDIOGRAM (TEE);  Surgeon: Jerline Pain, MD;  Location: [facility] ENDOSCOPY;  Service: Cardiovascular;  Laterality: N/A;   Family History  Problem Relation Age of Onset  . Hyperlipidemia Mother   . Asthma Daughter   . Diabetes Maternal Grandmother   . Stroke Maternal Grandmother   . Kidney disease Maternal Grandfather   . Kidney disease Paternal Grandmother    Social History:  reports that  has never smoked. she has never used smokeless tobacco. She reports that she drinks about 2.0 oz of alcohol per week. She reports that she does not use drugs.  ROS: As per HPI otherwise negative.  Physical Exam: Vitals:   05/05/17 1100 05/05/17 1115 05/05/17 1230 05/05/17 1343  BP: (!) 182/105 (!) 190/102 (!) 188/111 (!) 202/113  Pulse: 84 82 85   Resp:    18  Temp:    98.5 F (36.9 C)  TempSrc:    Oral  SpO2: 98% 97% 99% 99%  Weight:    88.2 kg (  194 lb 8 oz)  Height:    5\' 8"  (1.727 m)     General: Well developed, well nourished, in no acute distress. Head: Normocephalic, atraumatic, sclera non-icteric, mucus membranes are moist. DM retinal dz Neck: Supple PCL lymphadenopathy/no masses. JVD not elevated. Lungs: Clear bilaterally to auscultation without wheezes, rales, or rhonchi. Breathing is unlabored. Heart: RRR with normal S1, S2.Gr2/6  murmurs,  No rubs, or gallops appreciated. Abdomen: Soft, non-tender, non-distended with normoactive bowel sounds. No rebound/guarding. No obvious abdominal masses.Liver down 4 cm Musculoskeletal:  Strength  and tone appear normal for age. Lower extremities: No edema or ischemic changes, no open wounds. Neuro: Alert and oriented X 3. Moves all extremities spontaneously. Psych:  Responds to questions appropriately with a normal affect. Dialysis Access: L forearm loop AVG + bruit, TDC in R chest  Allergies  Allergen Reactions  . No Known Allergies    Prior to Admission medications   Medication Sig Start Date End Date Taking? Authorizing Provider  carbamazepine (TEGRETOL) 200 MG tablet Take 200 tablets by mouth at bedtime.  01/03/17  Yes [provider]  diclofenac sodium (VOLTAREN) 1 % GEL Apply 2 g topically 4 (four) times daily. 01/24/17  Yes Lacroce, Hulen Shouts, MD  diphenhydramine-acetaminophen (TYLENOL PM) 25-500 MG TABS tablet Take 2 tablets by mouth at bedtime as needed.    Yes [provider]  gabapentin (NEURONTIN) 300 MG capsule Take 1 capsule (300 mg total) by mouth at bedtime. Patient taking differently: Take 900 mg by mouth at bedtime.  09/12/16  Yes Rosita Fire, MD  insulin NPH Human (NOVOLIN N) 100 UNIT/ML injection Inject 0.25 mLs (25 Units total) every morning into the skin. And syringes 1/day Patient taking differently: Inject 30 Units into the skin every morning. And syringes 1/day 03/01/17  Yes Renato Shin, MD  labetalol (NORMODYNE) 300 MG tablet Take 300 mg by mouth See admin instructions. Take 300mg  tablet at bedtime on diaylsis days M, W, F. Take 300mg  tablet twice daily on all other days 08/25/16  Yes [provider]  metoCLOPramide (REGLAN) 5 MG tablet Take 5 mg by mouth 3 (three) times daily with meals.    Yes [provider]  ondansetron (ZOFRAN) 4 MG tablet Take 1 tablet (4 mg total) by mouth every 8 (eight) hours as needed for nausea or vomiting. 09/12/16  Yes Rosita Fire, MD  promethazine (PHENERGAN) 25 MG tablet Take 1 tablet (25 mg total) by mouth every 8 (eight) hours as needed for nausea or vomiting. 09/12/16  Yes  Rosita Fire, MD  rosuvastatin (CRESTOR) 10 MG tablet Take 10 mg by mouth at bedtime. 03/30/17  Yes [provider]  sevelamer carbonate (RENVELA) 800 MG tablet Take 800 mg by mouth 3 (three) times daily with meals.   Yes [provider]  warfarin (COUMADIN) 5 MG tablet Take 1.5 tablets (7.5 mg total) by mouth one time only at 6 PM. Patient taking differently: Take 7.5-10 mg by mouth See admin instructions. Take 10 mg Monday, wed, Friday and Saturday Sunday Takes 7.5 mg   Thursday, tuesday 01/25/17 04/27/17  Melanee Spry, MD   Current Facility-Administered Medications  Medication Dose Route Frequency Provider Last Rate Last Dose  . 0.9 %  sodium chloride infusion  250 mL Intravenous PRN Merton Border, MD      . acetaminophen (TYLENOL) tablet 650 mg  650 mg Oral Q6H PRN Merton Border, MD       Or  . acetaminophen (TYLENOL) suppository  650 mg  650 mg Rectal Q6H PRN Merton Border, MD      . aspirin EC tablet 81 mg  81 mg Oral Daily Merton Border, MD   81 mg at 05/05/17 1139  . carbamazepine (TEGRETOL) tablet 200 mg  200 mg Oral QHS Merton Border, MD   200 mg at 05/05/17 0120  . gabapentin (NEURONTIN) capsule 900 mg  900 mg Oral QHS Merton Border, MD   900 mg at 05/05/17 0120  . HYDROcodone-acetaminophen (NORCO/VICODIN) 5-325 MG per tablet 1-2 tablet  1-2 tablet Oral Q4H PRN Merton Border, MD      . insulin aspart (novoLOG) injection 0-5 Units  0-5 Units Subcutaneous QHS Merton Border, MD   2 Units at 05/05/17 0118  . insulin aspart (novoLOG) injection 0-9 Units  0-9 Units Subcutaneous TID WC Merton Border, MD   5 Units at 05/05/17 1140  . insulin NPH Human (HUMULIN N,NOVOLIN N) injection 25 Units  25 Units Subcutaneous BID AC & HS Merton Border, MD   25 Units at 05/05/17 1225  . labetalol (NORMODYNE) tablet 300 mg  300 mg Oral QHS Osei-Bonsu, Iona Beard, MD      . Derrill Memo ON 05/06/2017] labetalol (NORMODYNE) tablet 300 mg  300 mg Oral Once per day on Sun Tue Thu Sat Benito Mccreedy, MD       . metoCLOPramide (REGLAN) tablet 5 mg  5 mg Oral TID WC Merton Border, MD   5 mg at 05/05/17 1139  . ondansetron (ZOFRAN) tablet 4 mg  4 mg Oral Q6H PRN Merton Border, MD       Or  . ondansetron (ZOFRAN) injection 4 mg  4 mg Intravenous Q6H PRN Merton Border, MD      . promethazine (PHENERGAN) tablet 25 mg  25 mg Oral Q8H PRN Osei-Bonsu, George, MD      . rosuvastatin (CRESTOR) tablet 10 mg  10 mg Oral QHS Merton Border, MD   10 mg at 05/05/17 0120  . sevelamer carbonate (RENVELA) tablet 800 mg  800 mg Oral TID WC Merton Border, MD   800 mg at 05/05/17 1139  . sodium chloride flush (NS) 0.9 % injection 3 mL  3 mL Intravenous Q12H Merton Border, MD      . sodium chloride flush (NS) 0.9 % injection 3 mL  3 mL Intravenous PRN Merton Border, MD      . Warfarin - Pharmacist Dosing Inpatient   Does not apply q1800 Erenest Blank, RPH      . zolpidem (AMBIEN) tablet 5 mg  5 mg Oral QHS PRN Merton Border, MD       Labs: Basic Metabolic Panel: Recent Labs  Lab 05/04/17 1517  NA 125*  K 5.0  CL 87*  CO2 23  GLUCOSE 706*  BUN 37*  CREATININE 9.02*  CALCIUM 8.5*   CBC: Recent Labs  Lab 05/04/17 1517  WBC 6.4  HGB 11.1*  HCT 33.7*  MCV 91.1  PLT 214   Cardiac Enzymes: Recent Labs  Lab 05/05/17 0154 05/05/17 1003  TROPONINI <0.03 <0.03   CBG: Recent Labs  Lab 05/04/17 2153 05/04/17 2303 05/05/17 0103 05/05/17 0943 05/05/17 1127  GLUCAP 312* 257* 211* 271* 278*   Studies/Results: Dg Chest 2 View  Result Date: 05/04/2017 CLINICAL DATA:  Chest pain EXAM: CHEST  2 VIEW COMPARISON:  09/07/2016 FINDINGS: The heart remains borderline enlarged. Normal vascularity. Lungs clear. Right jugular tunneled dialysis catheter is stable with its tip at the cavoatrial junction. No pneumothorax. No pleural  effusion. IMPRESSION: No active cardiopulmonary disease. Electronically Signed   By: Marybelle Killings M.D.   On: 05/04/2017 16:14   Ct Chest W Contrast  Result Date: 05/05/2017 CLINICAL DATA:   37 year old female dialysis patient. Central and right side chest pain since yesterday with palpitations. 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 Last dialyzed 3 days ago. 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 EXAM: CT CHEST WITH CONTRAST TECHNIQUE: Multidetector CT imaging of the chest was performed during intravenous contrast administration. CONTRAST:  72mL ISOVUE-300 IOPAMIDOL (ISOVUE-300) INJECTION 61% COMPARISON:  CTA chest 01/14/2017. Report of transesophageal echocardiogram 01/11/2017. Repeat echocardiogram pending. FINDINGS: Cardiovascular: It appears the right upper extremity was injected for this exam. Right IJ approach tunneled dialysis catheter. The right IJ, right subclavian vein, right innominate vein and SVC appear patent without evidence of thrombus. There is subtle and indistinct hypodensity along the posterior aspect of the right atrium near the confluence with the IVC (series 5, image 54), but this could be contrast mixing artifact as the IVC is not opacified. No other right atrium filling defect. Pulsation artifact at the tips of the dialysis catheter. Stable cardiomegaly. No pericardial effusion. The other cardiac chambers and central mediastinal vascular structures are enhancing and appear to be patent. There is aortic dominant contrast timing, the bilateral pulmonary arteries are not well evaluated. No aortic atherosclerosis. No definite calcified coronary artery atherosclerosis. Mediastinum/Nodes: Negative.  No mediastinal lymphadenopathy. Lungs/Pleura: Major airways are patent and lung volumes are improved compared to the September CT chronic mild anterior basal segment left lower lobe scarring or atelectasis. No pleural effusion or other pulmonary opacity. Upper Abdomen: Negative visible liver, gallbladder, spleen, pancreas, adrenal glands, and bowel in the upper abdomen. Visible kidneys may be mildly hypoenhancing, but otherwise appear normal.  Musculoskeletal: No acute osseous abnormality identified. Mildly heterogeneous bone mineralization probably due to renal osteodystrophy. IMPRESSION: 1. Negative for thrombus along the course of the right IJ dialysis catheter. Right IJ, innominate vein, and SVC are patent. 2. Poor CT correlation of the 2 cm right atrial mass described on TEE in 2018; questionable low-density filling defect along the posterior right atrium near the confluence with the IVC, but this could simply be mixing artifact from unopacified contrast. Recommend attention on repeat Echocardiogram. 3. Otherwise no acute findings and largely unremarkable chest CT. Electronically Signed   By: Genevie Ann M.D.   On: 05/05/2017 08:26    Dialysis Orders:  MWF at [facility] 4hr, 400/800, EDW 87kg, 2K/2Ca, AVG, no heparin now (on warfarin) - Venofer 50mg  IV weekly - Mircera 167mcg IV q 2 weeks (last 1/16)  Assessment/Plan: 1.  Chest pain: Trops negative. EKG slightly abnormal. Echo pending. Per primary. Suspect GI 2.  Hx R atrial thrombus assoc with TDC: On warfarin. Awaiting echo. If negative, can possibly get TDC removed and d/c warfarin. 3.  ESRD:  Usually MWF, missed last HD. BP high. Will order HD for today, will likely be overnight. Needs fair amt vol off 4.  Hypertension/volume: BP high, 4 L UF ordered. 5.  Anemia: Hgb 11.1. No ESA ordered for now. 6.  Metabolic bone disease: Ca 8.5, follow labs. 7.  Type 1 DM: BS very high on admit, trending down. Last A1c 8.2%. Insulin per primary.  Veneta Penton, PA-C 05/05/2017, 2:07 PM  [facility] Pager: [phone] I have seen and examined this patient and agree with the plan of care  Seen,eval, examined, changes made. Counseled.  Need to avoid CT contrast with  her Residual Renal Function .  Jeneen Rinks Philip Kotlyar 05/05/2017, 2:40 PM

## 2017-05-05 NOTE — ED Notes (Signed)
Attempted to call report to [phone].  

## 2017-05-05 NOTE — Progress Notes (Signed)
*  PRELIMINARY RESULTS* Echocardiogram 2D Echocardiogram has been performed.  Carrie [name] 05/05/2017, 6:04 PM

## 2017-05-05 NOTE — ED Notes (Signed)
Carb Mortified Diet  Ordered for Lunch. 

## 2017-05-05 NOTE — Progress Notes (Signed)
ANTICOAGULATION CONSULT NOTE - Initial Consult  Pharmacy Consult for Warfarin  Indication: HD cath thrombus  Allergies  Allergen Reactions  . No Known Allergies     Vital Signs: Temp: 99 F (37.2 C) (01/18 1913) Temp Source: Oral (01/18 1913) BP: 163/90 (01/19 0030) Pulse Rate: 82 (01/19 0030)  Labs: Recent Labs    05/04/17 1517 05/04/17 2358  HGB 11.1*  --   HCT 33.7*  --   PLT 214  --   LABPROT  --  24.2*  INR  --  2.19  CREATININE 9.02*  --     CrCl cannot be calculated (Unknown ideal weight.).   Medical History: Past Medical History:  Diagnosis Date  . Anemia   . Diabetic neuropathy ([place])   . Diabetic retinopathy ([place])   . ESRD (end stage renal disease) on dialysis [facility])    "[place]WF; [place]" (02/02/2017)  . Gastroparesis   . HA (headache)    "qd if I didn't take my RX" (02/02/2017)  . Hemodialysis access site with arteriovenous graft ([place])   . Hypertension   . Neuropathy   . Trigeminal neuralgia   . 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 Pt presents to the ED with CP/palpitations. INR is therapeutic at 2.19. Hgb 11.1    Goal of Therapy:  INR 2-3 Monitor platelets by anticoagulation protocol: Yes   Plan:  -Warfarin 10 mg PO x 1 now -Daily PT/INR -Monitor for bleeding  Narda Bonds 05/05/2017,12:37 AM

## 2017-05-05 NOTE — ED Notes (Signed)
Patient CBG was 271.

## 2017-05-06 LAB — RENAL FUNCTION PANEL
ANION GAP: 16 — AB (ref 5–15)
Albumin: 3 g/dL — ABNORMAL LOW (ref 3.5–5.0)
BUN: 49 mg/dL — ABNORMAL HIGH (ref 6–20)
CALCIUM: 8.4 mg/dL — AB (ref 8.9–10.3)
CHLORIDE: 92 mmol/L — AB (ref 101–111)
CO2: 22 mmol/L (ref 22–32)
Creatinine, Ser: 11.52 mg/dL — ABNORMAL HIGH (ref 0.44–1.00)
GFR calc non Af Amer: 4 mL/min — ABNORMAL LOW (ref >60)
GFR, EST AFRICAN AMERICAN: 4 mL/min — AB (ref >60)
Glucose, Bld: 198 mg/dL — ABNORMAL HIGH (ref 65–99)
POTASSIUM: 4 mmol/L (ref 3.5–5.1)
Phosphorus: 6.4 mg/dL — ABNORMAL HIGH (ref 2.5–4.6)
SODIUM: 130 mmol/L — AB (ref 135–145)

## 2017-05-06 LAB — CBC
HEMATOCRIT: 33.2 % — AB (ref 36.0–46.0)
HEMOGLOBIN: 10.6 g/dL — AB (ref 12.0–15.0)
MCH: 29.2 pg (ref 26.0–34.0)
MCHC: 31.9 g/dL (ref 30.0–36.0)
MCV: 91.5 fL (ref 78.0–100.0)
Platelets: 240 10*3/uL (ref 150–400)
RBC: 3.63 MIL/uL — AB (ref 3.87–5.11)
RDW: 17.8 % — ABNORMAL HIGH (ref 11.5–15.5)
WBC: 6.3 10*3/uL (ref 4.0–10.5)

## 2017-05-06 LAB — PROTIME-INR
INR: 2.47
Prothrombin Time: 26.6 seconds — ABNORMAL HIGH (ref 11.4–15.2)

## 2017-05-06 LAB — ECHOCARDIOGRAM COMPLETE
Height: 68 in
Weight: 3112 oz

## 2017-05-06 LAB — GLUCOSE, CAPILLARY
GLUCOSE-CAPILLARY: 132 mg/dL — AB (ref 65–99)
GLUCOSE-CAPILLARY: 159 mg/dL — AB (ref 65–99)
GLUCOSE-CAPILLARY: 243 mg/dL — AB (ref 65–99)
Glucose-Capillary: 225 mg/dL — ABNORMAL HIGH (ref 65–99)

## 2017-05-06 LAB — HEMOGLOBIN A1C
Hgb A1c MFr Bld: 10.4 % — ABNORMAL HIGH (ref 4.8–5.6)
Mean Plasma Glucose: 252 mg/dL

## 2017-05-06 MED ORDER — ALTEPLASE 2 MG IJ SOLR: 2.0000 mg | Freq: Once | INTRAMUSCULAR | Status: DC | PRN

## 2017-05-06 MED ORDER — LIDOCAINE HCL (PF) 1 % IJ SOLN: 5.0000 mL | INTRAMUSCULAR | Status: DC | PRN

## 2017-05-06 MED ORDER — LIDOCAINE-PRILOCAINE 2.5-2.5 % EX CREA: 1.0000 "application " | TOPICAL_CREAM | CUTANEOUS | Status: DC | PRN

## 2017-05-06 MED ORDER — PROMETHAZINE HCL 25 MG/ML IJ SOLN
12.5000 mg | Freq: Once | INTRAMUSCULAR | Status: AC
  Administered 2017-05-06: 12.5 mg via INTRAVENOUS

## 2017-05-06 MED ORDER — PROMETHAZINE HCL 25 MG/ML IJ SOLN
INTRAMUSCULAR | Status: AC
  Filled 2017-05-06: qty 1

## 2017-05-06 MED ORDER — SODIUM CHLORIDE 0.9 % IV SOLN: 100.0000 mL | INTRAVENOUS | Status: DC | PRN

## 2017-05-06 MED ORDER — PENTAFLUOROPROP-TETRAFLUOROETH EX AERO: 1.0000 "application " | INHALATION_SPRAY | CUTANEOUS | Status: DC | PRN

## 2017-05-06 MED ORDER — ONDANSETRON HCL 4 MG/2ML IJ SOLN
INTRAMUSCULAR | Status: AC
  Filled 2017-05-06: qty 2

## 2017-05-06 MED ORDER — HEPARIN SODIUM (PORCINE) 1000 UNIT/ML DIALYSIS: 1000.0000 [IU] | INTRAMUSCULAR | Status: DC | PRN

## 2017-05-06 MED ORDER — WARFARIN SODIUM 10 MG PO TABS
10.0000 mg | ORAL_TABLET | Freq: Once | ORAL | Status: AC
  Administered 2017-05-06: 10 mg via ORAL
  Filled 2017-05-06: qty 1

## 2017-05-06 NOTE — Progress Notes (Signed)
[facility]                                                                              Patient Demographics  Carrie [name], is a 37 y.o. female, DOB - [birth year], QQI:[id number]  Admit date - 05/04/2017   Admitting Physician Merton Border, MD  Outpatient Primary MD for the patient is Glendon Axe, MD  Outpatient specialists:   LOS - 2  days    Chief Complaint  Patient presents with  . Chest Pain  . Palpitations       Brief summary  Carrie [name]  is a 36 y.o. 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  Patient was very hypoglycemic but not in DKA on evaluation in the ED, he was admitted for further management     Assessment & Plan    Active Problems:   Chest pain  #1 chest pain: Atypical ?  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 Chest CT negative Serial cardiac enzymes negative 2D echocardiogram pending  #2 hyperglycemia: Continue insulin with monitoring of glycemic status and control Improving  #3 hypertension: Resume hypertensive regimen Adjust as needed for BP control  #4 history of renal disease on hemodialysis: Nephrology consulted for routine dialysis  Code Status: Full code  DVT Prophylaxis: On warfarin Family Communication: Discussed in detail with the patient, all imaging results, lab results explained to the patient   Disposition Plan: Discharge home with outpatient follow-up with cardiology if echo report negative time Spent in minutes  22 minutes  Procedures:    Consultants:   Nephrology Cardiology-Dr. Adrian Prows Antimicrobials:      Medications  Scheduled Meds: . aspirin EC  81 mg Oral Daily  . carbamazepine  200 mg Oral QHS  . gabapentin  900 mg Oral QHS  .  insulin aspart  0-5 Units Subcutaneous QHS  . insulin aspart  0-9 Units Subcutaneous TID WC  . insulin NPH Human  25 Units Subcutaneous BID AC & HS  . labetalol  300 mg Oral QHS  . labetalol  300 mg Oral Once per day on Sun Tue Thu Sat  . metoCLOPramide  5 mg Oral TID WC  . multivitamin  1 tablet Oral QHS  . rosuvastatin  10 mg Oral QHS  . sevelamer carbonate  800 mg Oral TID WC  . sodium chloride flush  3 mL Intravenous Q12H  . Warfarin - Pharmacist Dosing Inpatient   Does not apply q1800   Continuous Infusions: . sodium chloride    . sodium chloride    . sodium chloride     PRN Meds:.sodium chloride, sodium chloride, sodium chloride, acetaminophen **OR** acetaminophen, alteplase, heparin, hydrALAZINE, HYDROcodone-acetaminophen, lidocaine (PF), lidocaine-prilocaine, ondansetron **OR** ondansetron (ZOFRAN) IV, pentafluoroprop-tetrafluoroeth, promethazine, sodium chloride flush, zolpidem   Antibiotics   Anti-infectives (From admission, onward)   None        Subjective:   [name] was seen and examined today.  She is chest pain-free, undergoing hemodialysis  Objective:   Vitals:   05/06/17 1000 05/06/17 1030 05/06/17 1100 05/06/17 1130  BP: (!) 149/71 137/72 (!) 97/55 (!) 122/58  Pulse: 87 88 89 83  Resp: 16 17 18 18   Temp:      TempSrc:      SpO2:      Weight:      Height:        Intake/Output Summary (Last 24 hours) at 05/06/2017 1158 Last data filed at 05/06/2017 0800 Gross per 24 hour  Intake 720 ml  Output -  Net 720 ml     Wt Readings from Last 3 Encounters:  05/06/17 89.5 kg (197 lb 5 oz)  03/01/17 89.1 kg (196 lb 6.4 oz)  02/15/17 88 kg (194 lb)     Exam  General: NAD  HEENT: NCAT,  PERRL,MMM  Neck: SUPPLE, (-) JVD  Cardiovascular: RRR, (-) GALLOP, (-) MURMUR  Respiratory: CTA  Gastrointestinal: SOFT, (-) DISTENSION, BS(+), (_) TENDERNESS  Ext: (-) CYANOSIS, (-) EDEMA  Neuro: A, OX 3  Skin:(-) RASH  Psych:NORMAL  AFFECT/MOOD   Data Reviewed:  I have personally reviewed following labs and imaging studies  Micro Results No results found for this or any previous visit (from the past 240 hour(s)).  Radiology Reports Dg Chest 2 View  Result Date: 05/04/2017 CLINICAL DATA:  Chest pain EXAM: CHEST  2 VIEW COMPARISON:  09/07/2016 FINDINGS: The heart remains borderline enlarged. Normal vascularity. Lungs clear. Right jugular tunneled dialysis catheter is stable with its tip at the cavoatrial junction. No pneumothorax. No pleural effusion. IMPRESSION: No active cardiopulmonary disease. Electronically Signed   By: Marybelle Killings M.D.   On: 05/04/2017 16:14   Ct Chest W Contrast  Result Date: 05/05/2017 CLINICAL DATA:  37 year old female dialysis patient. Central and right side chest pain since yesterday with palpitations. 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 Last dialyzed 3 days ago. 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 EXAM: CT CHEST WITH CONTRAST TECHNIQUE: Multidetector CT imaging of the chest was performed during intravenous contrast administration. CONTRAST:  64mL ISOVUE-300 IOPAMIDOL (ISOVUE-300) INJECTION 61% COMPARISON:  CTA chest 01/14/2017. Report of transesophageal echocardiogram 01/11/2017. Repeat echocardiogram pending. FINDINGS: Cardiovascular: It appears the right upper extremity was injected for this exam. Right IJ approach tunneled dialysis catheter. The right IJ, right subclavian vein, right innominate vein and SVC appear patent without evidence of thrombus. There is subtle and indistinct hypodensity along the posterior aspect of the right atrium near the confluence with the IVC (series 5, image 54), but this could be contrast mixing artifact as the IVC is not opacified. No other right atrium filling defect. Pulsation artifact at the tips of the dialysis catheter. Stable cardiomegaly. No pericardial effusion. The other cardiac chambers and central mediastinal vascular  structures are enhancing and appear to be patent. There is aortic dominant contrast timing, the bilateral pulmonary arteries are not well evaluated. No aortic atherosclerosis. No definite calcified coronary artery atherosclerosis. Mediastinum/Nodes: Negative.  No mediastinal lymphadenopathy. Lungs/Pleura: Major airways are patent and lung volumes are improved compared to the September CT chronic mild anterior basal segment left lower lobe scarring or atelectasis. No pleural effusion or other pulmonary opacity. Upper Abdomen: Negative visible liver, gallbladder, spleen, pancreas, adrenal glands, and bowel in the upper abdomen. Visible kidneys may be mildly hypoenhancing, but otherwise appear normal. Musculoskeletal: No acute osseous abnormality identified. Mildly heterogeneous bone mineralization probably due to renal osteodystrophy. IMPRESSION: 1. Negative for thrombus along the course of the right IJ dialysis catheter. Right  IJ, innominate vein, and SVC are patent. 2. Poor CT correlation of the 2 cm right atrial mass described on TEE in 2018; questionable low-density filling defect along the posterior right atrium near the confluence with the IVC, but this could simply be mixing artifact from unopacified contrast. Recommend attention on repeat Echocardiogram. 3. Otherwise no acute findings and largely unremarkable chest CT. Electronically Signed   By: Genevie Ann M.D.   On: 05/05/2017 08:26    Lab Data:  CBC: Recent Labs  Lab 05/04/17 1517 05/06/17 0945  WBC 6.4 6.3  HGB 11.1* 10.6*  HCT 33.7* 33.2*  MCV 91.1 91.5  PLT 214 811   Basic Metabolic Panel: Recent Labs  Lab 05/04/17 1517  NA 125*  K 5.0  CL 87*  CO2 23  GLUCOSE 706*  BUN 37*  CREATININE 9.02*  CALCIUM 8.5*   GFR: Estimated Creatinine Clearance: 10.3 mL/min (A) (by C-G formula based on SCr of 9.02 mg/dL (H)). Liver Function Tests: No results for input(s): AST, ALT, ALKPHOS, BILITOT, PROT, ALBUMIN in the last 168 hours. No  results for input(s): LIPASE, AMYLASE in the last 168 hours. No results for input(s): AMMONIA in the last 168 hours. Coagulation Profile: Recent Labs  Lab 05/04/17 2358 05/05/17 0154 05/06/17 0945  INR 2.19 2.17 2.47   Cardiac Enzymes: Recent Labs  Lab 05/05/17 0154 05/05/17 1003 05/05/17 1445  TROPONINI <0.03 <0.03 <0.03   BNP (last 3 results) No results for input(s): PROBNP in the last 8760 hours. HbA1C: Recent Labs    05/05/17 0154  HGBA1C 10.4*   CBG: Recent Labs  Lab 05/05/17 0943 05/05/17 1127 05/05/17 1659 05/05/17 2136 05/06/17 0730  GLUCAP 271* 278* 263* 155* 132*   Lipid Profile: No results for input(s): CHOL, HDL, LDLCALC, TRIG, CHOLHDL, LDLDIRECT in the last 72 hours. Thyroid Function Tests: No results for input(s): TSH, T4TOTAL, FREET4, T3FREE, THYROIDAB in the last 72 hours. Anemia Panel: No results for input(s): VITAMINB12, FOLATE, FERRITIN, TIBC, IRON, RETICCTPCT in the last 72 hours. Urine analysis:    Component Value Date/Time   COLORURINE YELLOW 05/04/2017 2030   APPEARANCEUR CLEAR 05/04/2017 2030   LABSPEC 1.018 05/04/2017 2030   PHURINE 8.0 05/04/2017 2030   GLUCOSEU >=500 (A) 05/04/2017 2030   HGBUR SMALL (A) 05/04/2017 2030   HGBUR large 05/25/2009 0900   BILIRUBINUR NEGATIVE 05/04/2017 2030   BILIRUBINUR neg 10/23/2012 1810   KETONESUR NEGATIVE 05/04/2017 2030   PROTEINUR >=300 (A) 05/04/2017 2030   UROBILINOGEN 0.2 04/16/2013 2155   NITRITE NEGATIVE 05/04/2017 2030   LEUKOCYTESUR NEGATIVE 05/04/2017 2030     OSEI-BONSU,Pegah Segel M.D. [facility] 05/06/2017, 11:58 AM  Pager: [phone] Between 7am to 7pm - call Pager - [phone]  After 7pm go to [email or web address] - password [email or web address]  Call night coverage person covering after 7pm

## 2017-05-06 NOTE — Progress Notes (Signed)
[facility] Progress Note   Subjective:  Seen in room, HD bumped from last night due to emergent patient. She will be dialyzed today. No CP at this time. Echo results pending.  Objective Vitals:   05/05/17 1343 05/05/17 1404 05/05/17 1959 05/06/17 0412  BP: (!) 202/113 (!) 172/109 137/83 (!) 148/84  Pulse:   88 88  Resp: 18 13 15 12   Temp: 98.5 F (36.9 C)  98.5 F (36.9 C) 98.4 F (36.9 C)  TempSrc: Oral  Oral Axillary  SpO2: 99%  99% 100%  Weight: 88.2 kg (194 lb 8 oz)   87.9 kg (193 lb 11.2 oz)  Height: 5\' 8"  (1.727 m)      Physical Exam General: Well appearing, NAD. Heart: RRR; Gr2/6 M murmur Soft LV lift Lungs: CTAB Extremities: No LE edema Dialysis Access: L forearm loop AVG + bruit, TDC in R chest Abdm soft,pos bs, nontender  Additional Objective Labs: Basic Metabolic Panel: Recent Labs  Lab 05/04/17 1517  NA 125*  K 5.0  CL 87*  CO2 23  GLUCOSE 706*  BUN 37*  CREATININE 9.02*  CALCIUM 8.5*   CBC: Recent Labs  Lab 05/04/17 1517  WBC 6.4  HGB 11.1*  HCT 33.7*  MCV 91.1  PLT 214   CBG: Recent Labs  Lab 05/05/17 0943 05/05/17 1127 05/05/17 1659 05/05/17 2136 05/06/17 0730  GLUCAP 271* 278* 263* 155* 132*   Studies/Results: Dg Chest 2 View  Result Date: 05/04/2017 CLINICAL DATA:  Chest pain EXAM: CHEST  2 VIEW COMPARISON:  09/07/2016 FINDINGS: The heart remains borderline enlarged. Normal vascularity. Lungs clear. Right jugular tunneled dialysis catheter is stable with its tip at the cavoatrial junction. No pneumothorax. No pleural effusion. IMPRESSION: No active cardiopulmonary disease. Electronically Signed   By: Marybelle Killings M.D.   On: 05/04/2017 16:14   Ct Chest W Contrast  Result Date: 05/05/2017 CLINICAL DATA:  37 year old female dialysis patient. Central and right side chest pain since yesterday with palpitations. 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 Last dialyzed 3 days ago. History of right  atrial mass on echocardiogram. EXAM: CT CHEST WITH CONTRAST TECHNIQUE: Multidetector CT imaging of the chest was performed during intravenous contrast administration. CONTRAST:  71mL ISOVUE-300 IOPAMIDOL (ISOVUE-300) INJECTION 61% COMPARISON:  CTA chest 01/14/2017. Report of transesophageal echocardiogram 01/11/2017. Repeat echocardiogram pending. FINDINGS: Cardiovascular: It appears the right upper extremity was injected for this exam. Right IJ approach tunneled dialysis catheter. The right IJ, right subclavian vein, right innominate vein and SVC appear patent without evidence of thrombus. There is subtle and indistinct hypodensity along the posterior aspect of the right atrium near the confluence with the IVC (series 5, image 54), but this could be contrast mixing artifact as the IVC is not opacified. No other right atrium filling defect. Pulsation artifact at the tips of the dialysis catheter. Stable cardiomegaly. No pericardial effusion. The other cardiac chambers and central mediastinal vascular structures are enhancing and appear to be patent. There is aortic dominant contrast timing, the bilateral pulmonary arteries are not well evaluated. No aortic atherosclerosis. No definite calcified coronary artery atherosclerosis. Mediastinum/Nodes: Negative.  No mediastinal lymphadenopathy. Lungs/Pleura: Major airways are patent and lung volumes are improved compared to the September CT chronic mild anterior basal segment left lower lobe scarring or atelectasis. No pleural effusion or other pulmonary opacity. Upper Abdomen: Negative visible liver, gallbladder, spleen, pancreas, adrenal glands, and bowel in the upper abdomen. Visible kidneys may be mildly hypoenhancing, but otherwise appear normal. Musculoskeletal:  No acute osseous abnormality identified. Mildly heterogeneous bone mineralization probably due to renal osteodystrophy. IMPRESSION: 1. Negative for thrombus along the course of the right IJ dialysis catheter.  Right IJ, innominate vein, and SVC are patent. 2. Poor CT correlation of the 2 cm right atrial mass described on TEE in 2018; questionable low-density filling defect along the posterior right atrium near the confluence with the IVC, but this could simply be mixing artifact from unopacified contrast. Recommend attention on repeat Echocardiogram. 3. Otherwise no acute findings and largely unremarkable chest CT. Electronically Signed   By: Genevie Ann M.D.   On: 05/05/2017 08:26   Medications: . sodium chloride    . sodium chloride    . sodium chloride     . aspirin EC  81 mg Oral Daily  . carbamazepine  200 mg Oral QHS  . gabapentin  900 mg Oral QHS  . insulin aspart  0-5 Units Subcutaneous QHS  . insulin aspart  0-9 Units Subcutaneous TID WC  . insulin NPH Human  25 Units Subcutaneous BID AC & HS  . labetalol  300 mg Oral QHS  . labetalol  300 mg Oral Once per day on Sun Tue Thu Sat  . metoCLOPramide  5 mg Oral TID WC  . multivitamin  1 tablet Oral QHS  . rosuvastatin  10 mg Oral QHS  . sevelamer carbonate  800 mg Oral TID WC  . sodium chloride flush  3 mL Intravenous Q12H  . Warfarin - Pharmacist Dosing Inpatient   Does not apply q1800    Dialysis Orders: MWF at [facility] 4hr, 400/800, EDW 87kg, 2K/2Ca, AVG, no heparin now (on warfarin) - Venofer 50mg  IV weekly - Mircera 111mcg IV q 2 weeks (last 1/16)  Assessment/Plan: 1.  Chest pain: Trops negative. EKG slightly abnormal. Echo pending. ?Suspect GI issue. Per primary.  2.  Hx R atrial thrombus assoc with TDC: On warfarin. Awaiting echo. If negative, can possibly get TDC removed and d/c warfarin. 3.  ESRD:  Usually MWF, missed last HD. BP high -> HD today. Will order for Monday too in case she is still here. 4.  Hypertension/volume: BP high, 4 L UF ordered. 5.  Anemia: Hgb 11.1. No ESA ordered for now. 6.  Metabolic bone disease: Ca 8.5, follow labs. 7.  Type 1 DM: BS very high on admit, trending down. Last A1c 8.2%. Insulin  per primary.     Veneta Penton, PA-C 05/06/2017, 8:43 AM  [facility] Pager: [phone]

## 2017-05-06 NOTE — Consult Note (Addendum)
CARDIOLOGY CONSULT NOTE  Patient ID: Carrie [name] MRN: [id number] DOB/AGE: 1981/03/17 37 y.o.  Admit date: 05/04/2017 Referring Physician  Isla Pence, MD Primary Physician:  Glendon Axe, MD Reason for Consultation  Chest Pain  HPI: Carrie [name]  is a 37 y.o. 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 She describes this as sharp and non radiating and associated with worse pain on deep breath or movement with deep breath. Underwent CTA which r/o PE and no coronary calcification or cardiac abnormality. Has had no recurrence of chest pain since. States she is able to climb 2 flights of stairs at home in her apartment without chest pain or dyspnea, but states her neuropathy and gastroparesis is affecting her life with uncontrolled DM.   Past Medical History:  Diagnosis Date  . Anemia   . Diabetic neuropathy ([place])   . Diabetic retinopathy ([place])   . ESRD (end stage renal disease) on dialysis [facility])    "MWF; [place]" (02/02/2017)  . Gastroparesis   . HA (headache)    "qd if I didn't take my RX" (02/02/2017)  . Hemodialysis access site with arteriovenous graft ([place])   . Hypertension   . Neuropathy   . Renal insufficiency   . Trigeminal neuralgia   . Type I diabetes mellitus ([place])      Past Surgical History:  Procedure Laterality Date  . AV FISTULA PLACEMENT Left 09/07/2016   Procedure: Left arm 1st Stage Basilic AV Fistula;  Surgeon: [name], MD;  Location: [facility] OR;  Service: Vascular;  Laterality: Left;  . DILATION AND CURETTAGE OF UTERUS    . EXCISIONAL HEMORRHOIDECTOMY    . EYE SURGERY    . INSERTION OF DIALYSIS CATHETER Right 09/07/2016   Procedure: INSERTION OF DIALYSIS CATHETER;  Surgeon: [name], MD;  Location: [facility] OR;  Service: Vascular;  Laterality: Right;  . RETINAL LASER PROCEDURE Bilateral    "related to  diabetes"  . REVISON OF ARTERIOVENOUS FISTULA Left 01/15/2017   Procedure: INSERTION OF  AV GORE-TEX GRAFT;  Surgeon: Elam Dutch, MD;  Location: [facility];  Service: Vascular;  Laterality: Left;  . TEE WITHOUT CARDIOVERSION N/A 01/11/2017   Procedure: TRANSESOPHAGEAL ECHOCARDIOGRAM (TEE);  Surgeon: Jerline Pain, MD;  Location: [facility] ENDOSCOPY;  Service: Cardiovascular;  Laterality: N/A;     Family History  Problem Relation Age of Onset  . Hyperlipidemia Mother   . Asthma Daughter   . Diabetes Maternal Grandmother   . Stroke Maternal Grandmother   . Kidney disease Maternal Grandfather   . Kidney disease Paternal Grandmother      Social History: Social History   Socioeconomic History  . Marital status: Single    Spouse name: Not on file  . Number of children: 1  . Years of education: 53  . Highest education level: Not on file  Social Needs  . Financial resource strain: Not on file  . Food insecurity - worry: Not on file  . Food insecurity - inability: Not on file  . Transportation needs - medical: Not on file  . Transportation needs - non-medical: Not on file  Occupational History  . Occupation: Call Center    Employer: [employer]  Tobacco Use  . Smoking status: Never Smoker  . Smokeless tobacco: Never Used  Substance and Sexual Activity  . Alcohol use:  Yes    Alcohol/week: 2.0 oz    Types: 4 Standard drinks or equivalent per week    Comment: 02/02/2017 "nothing in the last 6 months; never had problem w/it"  . Drug use: No  . Sexual activity: Yes  Other Topics Concern  . Not on file  Social History Narrative   Patient lives at home with her father and daughter.    Education some college.   Right handed   Caffeine Five sodas daily.           Medications Prior to Admission  Medication Sig Dispense Refill Last Dose  . carbamazepine (TEGRETOL) 200 MG tablet Take 200 tablets by mouth at bedtime.   1 05/03/2017 at Unknown time  . diclofenac sodium (VOLTAREN) 1  % GEL Apply 2 g topically 4 (four) times daily. 1 Tube 0 05/04/2017 at Unknown time  . diphenhydramine-acetaminophen (TYLENOL PM) 25-500 MG TABS tablet Take 2 tablets by mouth at bedtime as needed.    05/02/2017 at prn  . gabapentin (NEURONTIN) 300 MG capsule Take 1 capsule (300 mg total) by mouth at bedtime. (Patient taking differently: Take 900 mg by mouth at bedtime. )   05/03/2017 at Unknown time  . insulin NPH Human (NOVOLIN N) 100 UNIT/ML injection Inject 0.25 mLs (25 Units total) every morning into the skin. And syringes 1/day (Patient taking differently: Inject 30 Units into the skin every morning. And syringes 1/day) 10 mL 11 05/04/2017 at Unknown time  . labetalol (NORMODYNE) 300 MG tablet Take 300 mg by mouth See admin instructions. Take 300mg  tablet at bedtime on diaylsis days M, W, F. Take 300mg  tablet twice daily on all other days  0 05/04/2017 at Unknown time  . metoCLOPramide (REGLAN) 5 MG tablet Take 5 mg by mouth 3 (three) times daily with meals.    05/04/2017 at Unknown time  . ondansetron (ZOFRAN) 4 MG tablet Take 1 tablet (4 mg total) by mouth every 8 (eight) hours as needed for nausea or vomiting. 20 tablet 0 05/04/2017 at prn  . promethazine (PHENERGAN) 25 MG tablet Take 1 tablet (25 mg total) by mouth every 8 (eight) hours as needed for nausea or vomiting. 10 tablet 0 05/04/2017 at prn  . rosuvastatin (CRESTOR) 10 MG tablet Take 10 mg by mouth at bedtime.  1 05/03/2017 at Unknown time  . sevelamer carbonate (RENVELA) 800 MG tablet Take 800 mg by mouth 3 (three) times daily with meals.   05/04/2017 at Unknown time  . warfarin (COUMADIN) 5 MG tablet Take 5 mg by mouth See admin instructions. Per pt. She takes 7.5mg  on Tuesday and Thursday, and 10mg  on Mondays, Wednesday, Friday, Saturday, and Sunday.   05/03/2017  . warfarin (COUMADIN) 5 MG tablet Take 1.5 tablets (7.5 mg total) by mouth one time only at 6 PM. (Patient taking differently: Take 7.5-10 mg by mouth See admin instructions. Take 10  mg Monday, wed, Friday and Saturday Sunday Takes 7.5 mg   Thursday, tuesday) 90 tablet 0 Taking     Review of Systems - No fever, chills, dyspnea, palpitations, recent weight change, hemoptysis, leg edema.    Physical Exam: Blood pressure 134/84, pulse 89, temperature 98.4 F (36.9 C), temperature source Oral, resp. rate 20, height 5\' 8"  (1.727 m), weight 89.5 kg (197 lb 5 oz), last menstrual period 07/17/2016, SpO2 99 %.  Physical Exam  Constitutional: She is oriented to person, place, and time and well-developed, well-nourished, and in no distress. No distress.  HENT:  Head: Normocephalic.  Eyes: Conjunctivae are normal.  Neck: Normal range of motion. Neck supple.  Cardiovascular: Normal rate, regular rhythm, normal heart sounds and intact distal pulses.  Left axillary AV fistula present  Pulmonary/Chest: Effort normal and breath sounds normal.  Abdominal: Soft. Bowel sounds are normal.  Musculoskeletal: Normal range of motion.  Neurological: She is alert and oriented to person, place, and time.  Skin: Skin is warm and dry.  Psychiatric: Affect normal.  Labs:   Lab Results  Component Value Date   WBC 6.3 05/06/2017   HGB 10.6 (L) 05/06/2017   HCT 33.2 (L) 05/06/2017   MCV 91.5 05/06/2017   PLT 240 05/06/2017    Recent Labs  Lab 05/06/17 0945  NA 130*  K 4.0  CL 92*  CO2 22  BUN 49*  CREATININE 11.52*  CALCIUM 8.4*  GLUCOSE 198*    Lipid Panel     Component Value Date/Time   CHOL 193 05/25/2009 2125   TRIG 56 05/25/2009 2125   HDL 95 05/25/2009 2125   CHOLHDL 2.0 Ratio 05/25/2009 2125   VLDL 11 05/25/2009 2125   LDLCALC 87 05/25/2009 2125    BNP (last 3 results) No results for input(s): BNP in the last 8760 hours.  HEMOGLOBIN A1C Lab Results  Component Value Date   HGBA1C 10.4 (H) 05/05/2017   MPG 252 05/05/2017    Cardiac Panel (last 3 results) Recent Labs    05/05/17 0154 05/05/17 1003 05/05/17 1445  TROPONINI <0.03 <0.03 <0.03    Lab  Results  Component Value Date   TROPONINI <0.03 05/05/2017     TSH Recent Labs    12/12/16 1530  TSH 1.33    Radiology: Ct Chest W Contrast  Result Date: 05/05/2017 CLINICAL DATA:  37 year old female dialysis patient. Central and right side chest pain since yesterday with palpitations. 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 Last dialyzed 3 days ago. 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 EXAM: CT CHEST WITH CONTRAST TECHNIQUE: Multidetector CT imaging of the chest was performed during intravenous contrast administration. CONTRAST:  56mL ISOVUE-300 IOPAMIDOL (ISOVUE-300) INJECTION 61% COMPARISON:  CTA chest 01/14/2017. Report of transesophageal echocardiogram 01/11/2017. Repeat echocardiogram pending. FINDINGS: Cardiovascular: It appears the right upper extremity was injected for this exam. Right IJ approach tunneled dialysis catheter. The right IJ, right subclavian vein, right innominate vein and SVC appear patent without evidence of thrombus. There is subtle and indistinct hypodensity along the posterior aspect of the right atrium near the confluence with the IVC (series 5, image 54), but this could be contrast mixing artifact as the IVC is not opacified. No other right atrium filling defect. Pulsation artifact at the tips of the dialysis catheter. Stable cardiomegaly. No pericardial effusion. The other cardiac chambers and central mediastinal vascular structures are enhancing and appear to be patent. There is aortic dominant contrast timing, the bilateral pulmonary arteries are not well evaluated. No aortic atherosclerosis. No definite calcified coronary artery atherosclerosis. Mediastinum/Nodes: Negative.  No mediastinal lymphadenopathy. Lungs/Pleura: Major airways are patent and lung volumes are improved compared to the September CT chronic mild anterior basal segment left lower lobe scarring or atelectasis. No pleural effusion or other pulmonary opacity. Upper  Abdomen: Negative visible liver, gallbladder, spleen, pancreas, adrenal glands, and bowel in the upper abdomen. Visible kidneys may be mildly hypoenhancing, but otherwise appear normal. Musculoskeletal: No acute osseous abnormality identified. Mildly heterogeneous bone mineralization probably due to renal osteodystrophy. IMPRESSION: 1. Negative for thrombus along the course of the right IJ dialysis catheter.  Right IJ, innominate vein, and SVC are patent. 2. Poor CT correlation of the 2 cm right atrial mass described on TEE in 2018; questionable low-density filling defect along the posterior right atrium near the confluence with the IVC, but this could simply be mixing artifact from unopacified contrast. Recommend attention on repeat Echocardiogram. 3. Otherwise no acute findings and largely unremarkable chest CT. Electronically Signed   By: Genevie Ann M.D.   On: 05/05/2017 08:26    Scheduled Meds: . aspirin EC  81 mg Oral Daily  . carbamazepine  200 mg Oral QHS  . gabapentin  900 mg Oral QHS  . insulin aspart  0-5 Units Subcutaneous QHS  . insulin aspart  0-9 Units Subcutaneous TID WC  . insulin NPH Human  25 Units Subcutaneous BID AC & HS  . labetalol  300 mg Oral QHS  . labetalol  300 mg Oral Once per day on Sun Tue Thu Sat  . metoCLOPramide  5 mg Oral TID WC  . multivitamin  1 tablet Oral QHS  . rosuvastatin  10 mg Oral QHS  . sevelamer carbonate  800 mg Oral TID WC  . sodium chloride flush  3 mL Intravenous Q12H  . Warfarin - Pharmacist Dosing Inpatient   Does not apply q1800   Continuous Infusions: . sodium chloride     PRN Meds:.sodium chloride, acetaminophen **OR** acetaminophen, hydrALAZINE, HYDROcodone-acetaminophen, ondansetron **OR** ondansetron (ZOFRAN) IV, promethazine, sodium chloride flush, zolpidem  CARDIAC STUDIES:  EKG: 05/04/2016: NSR, Inferior and lateral ST depression and T inversion, CRO ischemia. New change from prior EKG from 01/10/2017.  ECHO:05/06/2017: - Left  ventricle: The cavity size was normal. Systolic function was   normal. Wall motion was normal; there were no regional wall   motion abnormalities. - Aortic valve: There was mild regurgitation. - Mitral valve: Valve area by pressure half-time: 1.71 cm^2. - Right atrium: There is appearance of a catheter in the RA, but   may be related to reverberation artifact. No vegetation   visualized.  CT Angio Chest 05/05/2017: 1. Negative for thrombus along the course of the right IJ dialysis catheter. Right IJ, innominate vein, and SVC are patent. 2. Poor CT correlation of the 2 cm right atrial mass described on TEE in 2018; questionable low-density filling defect along the posterior right atrium near the confluence with the IVC, but this could simply be mixing artifact from unopacified contrast. Recommend attention on repeat Echocardiogram. 3. Otherwise no acute findings and largely unremarkable chest CT.  ASSESSMENT AND PLAN:  1. Chest pain suggestive of musculoskeletal etiology.  2. Abnormal EKG suggestive of inferolateral ischemia. New change but negative troponins. Non-specific. No coronary calcification by CTA.  3. DM uncontrolled with hyperglycemia with stage V CKD on HD 4. Gastroparesis and peripheral neuropathy due to DM, patient having difficulty with diet due to gastroparesis. \5. 5. Right atrial thrombus  Rec: From cardiac standpoint, I feel comfortable for discharge home and I can f/u in the OP setting with OP stress due to DM. Check lipids.   Negative PE. No obvious mass in right atrium. On chronic anticoagulation.  I may consider repeat TEE to evaluate right atrial mass. Continue anticoagulation for now.   Adrian Prows, MD 05/06/2017, 6:26 PM [facility] [place] Pager: [phone] Office: [phone] If no answer Cell [phone]

## 2017-05-06 NOTE — Progress Notes (Signed)
ANTICOAGULATION CONSULT NOTE - Initial Consult  Pharmacy Consult for Warfarin  Indication: HD cath thrombus  Allergies  Allergen Reactions  . No Known Allergies     Vital Signs: Temp: 98.4 F (36.9 C) (01/20 1355) Temp Source: Oral (01/20 1355) BP: 134/84 (01/20 1355) Pulse Rate: 89 (01/20 1355)  Labs: Recent Labs    05/04/17 1517 05/04/17 2358 05/05/17 0154 05/05/17 1003 05/05/17 1445 05/06/17 0945  HGB 11.1*  --   --   --   --  10.6*  HCT 33.7*  --   --   --   --  33.2*  PLT 214  --   --   --   --  240  LABPROT  --  24.2* 24.0*  --   --  26.6*  INR  --  2.19 2.17  --   --  2.47  CREATININE 9.02*  --   --   --   --  11.52*  TROPONINI  --   --  <0.03 <0.03 <0.03  --     Estimated Creatinine Clearance: 8 mL/min (A) (by C-G formula based on SCr of 11.52 mg/dL (H)).   Assessment: 37 y/o F on warfarin PTA for presumed HD cath thrombus revealed as a right atrial mass on TEE. Pt presents to the ED with CP/palpitations. INR is therapeutic at 2.47. Hgb slightly down at 10.6, plt 240.   PTA dose: 7.5mg  on T/Thurs, 7.5mg  all other days.   Goal of Therapy:  INR 2-3 Monitor platelets by anticoagulation protocol: Yes   Plan:  -Warfarin 10 mg PO x 1 at 1800 tonight per PTA schedule -Daily PT/INR -Monitor for bleeding  Jalene Mullet, Pharm.D. PGY1 Pharmacy Resident 05/06/2017 2:20 PM Main Pharmacy: [phone]

## 2017-05-06 NOTE — Procedures (Signed)
I was present at this session.  I have reviewed the session itself and made appropriate changes.  HD via LLA avg . Access press ok. bp 130s -140s sys Boe Deans 1/20/201910:11 AM

## 2017-05-07 DIAGNOSIS — E1065 Type 1 diabetes mellitus with hyperglycemia: Secondary | ICD-10-CM

## 2017-05-07 DIAGNOSIS — E1143 Type 2 diabetes mellitus with diabetic autonomic (poly)neuropathy: Secondary | ICD-10-CM

## 2017-05-07 DIAGNOSIS — E1049 Type 1 diabetes mellitus with other diabetic neurological complication: Secondary | ICD-10-CM

## 2017-05-07 DIAGNOSIS — R079 Chest pain, unspecified: Secondary | ICD-10-CM

## 2017-05-07 DIAGNOSIS — K3184 Gastroparesis: Secondary | ICD-10-CM

## 2017-05-07 LAB — CBC
HCT: 35.1 % — ABNORMAL LOW (ref 36.0–46.0)
Hemoglobin: 11.1 g/dL — ABNORMAL LOW (ref 12.0–15.0)
MCH: 29.8 pg (ref 26.0–34.0)
MCHC: 31.6 g/dL (ref 30.0–36.0)
MCV: 94.1 fL (ref 78.0–100.0)
PLATELETS: 277 10*3/uL (ref 150–400)
RBC: 3.73 MIL/uL — AB (ref 3.87–5.11)
RDW: 18.4 % — AB (ref 11.5–15.5)
WBC: 5.5 10*3/uL (ref 4.0–10.5)

## 2017-05-07 LAB — RENAL FUNCTION PANEL
Albumin: 2.8 g/dL — ABNORMAL LOW (ref 3.5–5.0)
Anion gap: 12 (ref 5–15)
BUN: 29 mg/dL — ABNORMAL HIGH (ref 6–20)
CALCIUM: 8.4 mg/dL — AB (ref 8.9–10.3)
CO2: 25 mmol/L (ref 22–32)
CREATININE: 8.19 mg/dL — AB (ref 0.44–1.00)
Chloride: 94 mmol/L — ABNORMAL LOW (ref 101–111)
GFR calc non Af Amer: 6 mL/min — ABNORMAL LOW (ref >60)
GFR, EST AFRICAN AMERICAN: 7 mL/min — AB (ref >60)
Glucose, Bld: 251 mg/dL — ABNORMAL HIGH (ref 65–99)
Phosphorus: 4.9 mg/dL — ABNORMAL HIGH (ref 2.5–4.6)
Potassium: 4.3 mmol/L (ref 3.5–5.1)
SODIUM: 131 mmol/L — AB (ref 135–145)

## 2017-05-07 LAB — LIPID PANEL
CHOL/HDL RATIO: 2 ratio
Cholesterol: 179 mg/dL (ref 0–200)
HDL: 89 mg/dL (ref >40)
LDL Cholesterol: 61 mg/dL (ref 0–99)
Triglycerides: 143 mg/dL (ref <150)
VLDL: 29 mg/dL (ref 0–40)

## 2017-05-07 LAB — GLUCOSE, CAPILLARY
GLUCOSE-CAPILLARY: 156 mg/dL — AB (ref 65–99)
GLUCOSE-CAPILLARY: 225 mg/dL — AB (ref 65–99)
Glucose-Capillary: 100 mg/dL — ABNORMAL HIGH (ref 65–99)

## 2017-05-07 LAB — PROTIME-INR
INR: 2.07
PROTHROMBIN TIME: 23.1 s — AB (ref 11.4–15.2)

## 2017-05-07 MED ORDER — CARBAMAZEPINE 200 MG PO TABS: 200.0000 mg | ORAL_TABLET | Freq: Every day | ORAL | 1 refills | Status: AC

## 2017-05-07 MED ORDER — WARFARIN SODIUM 10 MG PO TABS: 10.0000 mg | ORAL_TABLET | Freq: Once | ORAL | Status: DC

## 2017-05-07 MED ORDER — INSULIN NPH (HUMAN) (ISOPHANE) 100 UNIT/ML ~~LOC~~ SUSP: 30.0000 [IU] | SUBCUTANEOUS | Status: DC

## 2017-05-07 MED ORDER — METOCLOPRAMIDE HCL 10 MG PO TABS
10.0000 mg | ORAL_TABLET | Freq: Three times a day (TID) | ORAL | Status: DC
  Administered 2017-05-07: 10 mg via ORAL
  Filled 2017-05-07: qty 1

## 2017-05-07 MED ORDER — ONDANSETRON HCL 4 MG/2ML IJ SOLN
INTRAMUSCULAR | Status: AC
  Filled 2017-05-07: qty 2

## 2017-05-07 NOTE — Progress Notes (Addendum)
Inpatient Diabetes Program Recommendations  AACE/ADA: New Consensus Statement on Inpatient Glycemic Control (2015)  Target Ranges:  Prepandial:   less than 140 mg/dL      Peak postprandial:   less than 180 mg/dL (1-2 hours)      Critically ill patients:  140 - 180 mg/dL   Lab Results  Component Value Date   GLUCAP 156 (H) 05/07/2017   HGBA1C 10.4 (H) 05/05/2017   Review of Glycemic Control  Diabetes history: DM 2 ( Sees Dr. Renato Shin) Outpatient Diabetes medications: NPH 30 units Daily Current orders for Inpatient glycemic control: NPH 25 units BID, Novolog Sensitive Correction 0-9 units tid + Novolog HS scale  Inpatient Diabetes Program Recommendations:    Patient takes NPH 30 units Daily. It is ordered BID here. Please consider decreasing frequency of NPH to Daily. Also consider Novolog meal coverage as patient has postprandial glucose spikes, Novolog 3 units tid  If patient consumes at least 50% of meals and glucose is at least 80 mg/dl.  A1c 10.4% on 05/05/17. Saw Dr. Loanne Drilling (Endocrinologist) on 03/01/17, increase NPH at that time.  Thanks,  Tama Headings RN, MSN, [facility] Inpatient Diabetes Coordinator Team Pager [phone] (8a-5p)

## 2017-05-07 NOTE — Discharge Instructions (Signed)

## 2017-05-07 NOTE — Progress Notes (Signed)
[facility] Progress Note   Subjective:   Seen and examined on HD. No new complaints. Ready to go home.  Objective Vitals:   05/06/17 1310 05/06/17 1355 05/06/17 1947 05/07/17 0451  BP: (!) 148/81 134/84 (!) 157/87 (!) 148/86  Pulse: 84 89 93 87  Resp: 18 20 10 16   Temp: 98.2 F (36.8 C) 98.4 F (36.9 C) 98.7 F (37.1 C) 98 F (36.7 C)  TempSrc: Oral Oral Oral Oral  SpO2: 99% 99% 100% 100%  Weight: 86.6 kg (190 lb 14.7 oz)   86.6 kg (191 lb)  Height:       Physical Exam General:NAD, WDWN, well appearing Heart:RRR, no rub or gallop Lungs:CTAB, nml WOB Abdomen:soft, NTND Extremities:no edema Dialysis Access: LU AVG, +b/t, cannulated. TDC in place.   Filed Weights   05/06/17 0850 05/06/17 1310 05/07/17 0451  Weight: 89.5 kg (197 lb 5 oz) 86.6 kg (190 lb 14.7 oz) 86.6 kg (191 lb)    Intake/Output Summary (Last 24 hours) at 05/07/2017 1321 Last data filed at 05/07/2017 0800 Gross per 24 hour  Intake 1203 ml  Output -  Net 1203 ml    Additional Objective Labs: Basic Metabolic Panel: Recent Labs  Lab 05/04/17 1517 05/06/17 0945 05/07/17 1114  NA 125* 130* 131*  K 5.0 4.0 4.3  CL 87* 92* 94*  CO2 23 22 25   GLUCOSE 706* 198* 251*  BUN 37* 49* 29*  CREATININE 9.02* 11.52* 8.19*  CALCIUM 8.5* 8.4* 8.4*  PHOS  --  6.4* 4.9*   Liver Function Tests: Recent Labs  Lab 05/06/17 0945 05/07/17 1114  ALBUMIN 3.0* 2.8*   CBC: Recent Labs  Lab 05/04/17 1517 05/06/17 0945 05/07/17 1114  WBC 6.4 6.3 5.5  HGB 11.1* 10.6* 11.1*  HCT 33.7* 33.2* 35.1*  MCV 91.1 91.5 94.1  PLT 214 240 277   Cardiac Enzymes: Recent Labs  Lab 05/05/17 0154 05/05/17 1003 05/05/17 1445  TROPONINI <0.03 <0.03 <0.03   CBG: Recent Labs  Lab 05/06/17 1354 05/06/17 1654 05/06/17 2033 05/07/17 0744 05/07/17 1111  GLUCAP 159* 225* 243* 156* 225*   Lab Results  Component Value Date   INR 2.07 05/07/2017   INR 2.47 05/06/2017   INR 2.17 05/05/2017    Studies/Results: No results found.  Medications: . sodium chloride     . aspirin EC  81 mg Oral Daily  . carbamazepine  200 mg Oral QHS  . gabapentin  900 mg Oral QHS  . insulin aspart  0-5 Units Subcutaneous QHS  . insulin aspart  0-9 Units Subcutaneous TID WC  . insulin NPH Human  25 Units Subcutaneous BID AC & HS  . labetalol  300 mg Oral QHS  . labetalol  300 mg Oral Once per day on Sun Tue Thu Sat  . metoCLOPramide  10 mg Oral TID WC  . multivitamin  1 tablet Oral QHS  . rosuvastatin  10 mg Oral QHS  . sevelamer carbonate  800 mg Oral TID WC  . sodium chloride flush  3 mL Intravenous Q12H  . warfarin  10 mg Oral ONCE-1800  . Warfarin - Pharmacist Dosing Inpatient   Does not apply q1800    Dialysis Orders: MWF at [facility] 4hr, 400/800, EDW 87kg, 2K/2Ca, AVG, no heparin now (on warfarin) - Venofer 50mg  IV weekly - Mircera 136mcg IV q 2 weeks (last 1/16)   Assessment/Plan: 1. Chest pain - per cardio likely MSK etiology. Abnormal EKG suggestive of inferolateral ischemia.  CT  angio neg for PE (1/19) 2. R atrial mass/thrombus assoc with TDC - ECHO (1/20) showed no obvious mass in R atria, Cardio suggest considering repeat TEE to eval R atrial mass and continuing anticoagulation.  3. ESRD - HD yesterday and again today per regular schedule.  4. Anemia of CKD- Hgb stable, 11.1. 5. Secondary hyperparathyroidism - Ca 8.4, P 4.9.  6. HTN/volume - Close to EDW. Net UF goal 1L today. BPs better post HD. Reassess EDW with d/c. 7. Nutrition - Alb 2.8, renal diet with fluid restriction.  8. DM - uncontrolled. Per primary  [name], PA-C [facility] Pager: [phone] 05/07/2017,1:21 PM  LOS: 3 days   Pt seen, examined and agree w A/P as above.  [name] MD [employer] pager [phone]   05/08/2017, 8:53 AM

## 2017-05-07 NOTE — Progress Notes (Signed)
ANTICOAGULATION CONSULT NOTE - Follow Up Consult  Pharmacy Consult for Coumadin Indication: h/o atrial clot  Allergies  Allergen Reactions  . No Known Allergies     Patient Measurements: Height: 5\' 8"  (172.7 cm) Weight: 191 lb (86.6 kg) IBW/kg (Calculated) : 63.9  Vital Signs: Temp: 98 F (36.7 C) (01/21 0451) Temp Source: Oral (01/21 0451) BP: 148/86 (01/21 0451) Pulse Rate: 87 (01/21 0451)  Labs: Recent Labs    05/04/17 1517 05/04/17 2358 05/05/17 0154 05/05/17 1003 05/05/17 1445 05/06/17 0945  HGB 11.1*  --   --   --   --  10.6*  HCT 33.7*  --   --   --   --  33.2*  PLT 214  --   --   --   --  240  LABPROT  --  24.2* 24.0*  --   --  26.6*  INR  --  2.19 2.17  --   --  2.47  CREATININE 9.02*  --   --   --   --  11.52*  TROPONINI  --   --  <0.03 <0.03 <0.03  --     Estimated Creatinine Clearance: 7.9 mL/min (A) (by C-G formula based on SCr of 11.52 mg/dL (H)).   Assessment:  Anticoag: Warfarin for hx R atrial thrombosis - INR 2.19 on admission, Hgb 10.6, plts 240, no bleeding noted -INR not done today but has been in goal - PTA regimen: 7.5mg  Tues/Thurs and 10mg  all other days.   Goal of Therapy:  INR 2-3 Monitor platelets by anticoagulation protocol: Yes   Plan:  -Warfarin 10 mg PO x 1 again tonight -Daily PT/INR - Possible discharge   Leotha Westermeyer S. Alford Highland, PharmD, [facility] Clinical Staff Pharmacist Pager [phone]  Eilene Ghazi Stillinger 05/07/2017,10:30 AM

## 2017-05-07 NOTE — Discharge Summary (Signed)
Discharge Summary  Carrie [name]:[id number] DOB: 05/16/1980  PCP: Glendon Axe, MD  Admit date: 05/04/2017 Discharge date: 05/07/2017  Time spent: 25 minutes   Recommendations for Outpatient Follow-up:  1. Follow-up with Dr. Einar Gip, cardiology in 2 weeks   Discharge Diagnoses:  Active Hospital Problems   Diagnosis Date Noted  . Chest pain 05/04/2017    Resolved Hospital Problems  No resolved problems to display.    Discharge Condition: Improved, being discharged home  Diet recommendation: carb modified, heart healthy   Vitals:   05/06/17 1947 05/07/17 0451  BP: (!) 157/87 (!) 148/86  Pulse: 93 87  Resp: 10 16  Temp: 98.7 F (37.1 C) 98 F (36.7 C)  SpO2: 100% 100%    History of present illness:  37 year old female past oral history poorly controlled diabetes mellitus with end-stage renal disease on dialysis plus secondary severe gastroparesis and atrial thrombus admitted on 1/18 for complaints of left-sided severe chest pain. Cardiology consulted. Patient underwent echocardiogram. Also during hospitalization, had episodes of nausea and vomiting secondary to gastroparesis  Hospital Course:  Active Problems:   Chest pain: Atypical. CT angiogram ruled out pulmonary embolus. No evidence of coronary calcification. Able to climb 2 flights of stairs at home without chest pain or dyspnea.Follow by cardiology. Chest pain suggestive of muscular skeletal etiology. Initial EKG noted questionable inferolateral ischemia, but normal troponins. Cardiology plans. Patient in a few weeks as outpatient for outpatient stress test. No abnormalities noted on echocardiogram  History of atrial thrombus: Continue on anticoagulation. Coumadin therapeutic. No mass noted on 2-D echo. Cardiology plans for possible TEE the future    diabetes mellitus, type I uncontrolled with secondary end-stage renal disease on hemodialysis: Received dialysis on Sunday and then again today to continue normal  schedule of Monday, Wednesday, Friday  Diabetic gastroparesis: Persistent and symptomatic. Continue Reglan plus when necessary anti-emetics. Able to tolerate breakfast this morning.    Procedures:  Echocardiogram done 1/20: Preserved ejection fraction, no evidence of diastolic dysfunction. Mild aortic regurg.  Consultations:  Cardiology   Discharge Exam: BP (!) 148/86 (BP Location: Right Arm)   Pulse 87   Temp 98 F (36.7 C) (Oral)   Resp 16   Ht 5\' 8"  (1.727 m)   Wt 86.6 kg (191 lb)   LMP 07/17/2016 (Within Weeks) Comment: Has not had a period since starting dialysis in May  SpO2 100%   BMI 29.04 kg/m   General: Alert and oriented 3, no acute distress  Cardiovascular: Regular rate and rhythm, S1-S2 Respiratory: Clear to auscultation bilaterally   Discharge Instructions You were cared for by a hospitalist during your hospital stay. If you have any questions about your discharge medications or the care you received while you were in the hospital after you are discharged, you can call the unit and asked to speak with the hospitalist on call if the hospitalist that took care of you is not available. Once you are discharged, your primary care physician will handle any further medical issues. Please note that NO REFILLS for any discharge medications will be authorized once you are discharged, as it is imperative that you return to your primary care physician (or establish a relationship with a primary care physician if you do not have one) for your aftercare needs so that they can reassess your need for medications and monitor your lab values.   Allergies as of 05/07/2017      Reactions   No Known Allergies  Medication List    TAKE these medications   carbamazepine 200 MG tablet Commonly known as:  TEGRETOL Take 1 tablet (200 mg total) by mouth at bedtime. What changed:  how much to take   diclofenac sodium 1 % Gel Commonly known as:  VOLTAREN Apply 2 g topically 4  (four) times daily.   diphenhydramine-acetaminophen 25-500 MG Tabs tablet Commonly known as:  TYLENOL PM Take 2 tablets by mouth at bedtime as needed.   gabapentin 300 MG capsule Commonly known as:  NEURONTIN Take 1 capsule (300 mg total) by mouth at bedtime. What changed:  how much to take   insulin NPH Human 100 UNIT/ML injection Commonly known as:  NOVOLIN N Inject 0.3 mLs (30 Units total) into the skin every morning. And syringes 1/day What changed:  how much to take   labetalol 300 MG tablet Commonly known as:  NORMODYNE Take 300 mg by mouth See admin instructions. Take 300mg  tablet at bedtime on diaylsis days M, W, F. Take 300mg  tablet twice daily on all other days   ondansetron 4 MG tablet Commonly known as:  ZOFRAN Take 1 tablet (4 mg total) by mouth every 8 (eight) hours as needed for nausea or vomiting.   promethazine 25 MG tablet Commonly known as:  PHENERGAN Take 1 tablet (25 mg total) by mouth every 8 (eight) hours as needed for nausea or vomiting.   REGLAN 5 MG tablet Generic drug:  metoCLOPramide Take 5 mg by mouth 3 (three) times daily with meals.   rosuvastatin 10 MG tablet Commonly known as:  CRESTOR Take 10 mg by mouth at bedtime.   sevelamer carbonate 800 MG tablet Commonly known as:  RENVELA Take 800 mg by mouth 3 (three) times daily with meals.   warfarin 5 MG tablet Commonly known as:  COUMADIN Take 5 mg by mouth See admin instructions. Per pt. She takes 7.5mg  on Tuesday and Thursday, and 10mg  on Mondays, Wednesday, Friday, Saturday, and Sunday. What changed:  Another medication with the same name was removed. Continue taking this medication, and follow the directions you see here.      Allergies  Allergen Reactions  . No Known Allergies    Follow-up Information    Adrian Prows, MD Follow up in 2 week(s).   Specialty:  Cardiology Contact information: [address] [place] [address] [phone]            The  results of significant diagnostics from this hospitalization (including imaging, microbiology, ancillary and laboratory) are listed below for reference.    Significant Diagnostic Studies: Dg Chest 2 View  Result Date: 05/04/2017 CLINICAL DATA:  Chest pain EXAM: CHEST  2 VIEW COMPARISON:  09/07/2016 FINDINGS: The heart remains borderline enlarged. Normal vascularity. Lungs clear. Right jugular tunneled dialysis catheter is stable with its tip at the cavoatrial junction. No pneumothorax. No pleural effusion. IMPRESSION: No active cardiopulmonary disease. Electronically Signed   By: Marybelle Killings M.D.   On: 05/04/2017 16:14   Ct Chest W Contrast  Result Date: 05/05/2017 CLINICAL DATA:  37 year old female dialysis patient. Central and right side chest pain since yesterday with palpitations. 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 Last dialyzed 3 days ago. 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 EXAM: CT CHEST WITH CONTRAST TECHNIQUE: Multidetector CT imaging of the chest was performed during intravenous contrast administration. CONTRAST:  58mL ISOVUE-300 IOPAMIDOL (ISOVUE-300) INJECTION 61% COMPARISON:  CTA chest 01/14/2017. Report of transesophageal echocardiogram 01/11/2017. Repeat echocardiogram pending. FINDINGS:  Cardiovascular: It appears the right upper extremity was injected for this exam. Right IJ approach tunneled dialysis catheter. The right IJ, right subclavian vein, right innominate vein and SVC appear patent without evidence of thrombus. There is subtle and indistinct hypodensity along the posterior aspect of the right atrium near the confluence with the IVC (series 5, image 54), but this could be contrast mixing artifact as the IVC is not opacified. No other right atrium filling defect. Pulsation artifact at the tips of the dialysis catheter. Stable cardiomegaly. No pericardial effusion. The other cardiac chambers and central mediastinal vascular structures are  enhancing and appear to be patent. There is aortic dominant contrast timing, the bilateral pulmonary arteries are not well evaluated. No aortic atherosclerosis. No definite calcified coronary artery atherosclerosis. Mediastinum/Nodes: Negative.  No mediastinal lymphadenopathy. Lungs/Pleura: Major airways are patent and lung volumes are improved compared to the September CT chronic mild anterior basal segment left lower lobe scarring or atelectasis. No pleural effusion or other pulmonary opacity. Upper Abdomen: Negative visible liver, gallbladder, spleen, pancreas, adrenal glands, and bowel in the upper abdomen. Visible kidneys may be mildly hypoenhancing, but otherwise appear normal. Musculoskeletal: No acute osseous abnormality identified. Mildly heterogeneous bone mineralization probably due to renal osteodystrophy. IMPRESSION: 1. Negative for thrombus along the course of the right IJ dialysis catheter. Right IJ, innominate vein, and SVC are patent. 2. Poor CT correlation of the 2 cm right atrial mass described on TEE in 2018; questionable low-density filling defect along the posterior right atrium near the confluence with the IVC, but this could simply be mixing artifact from unopacified contrast. Recommend attention on repeat Echocardiogram. 3. Otherwise no acute findings and largely unremarkable chest CT. Electronically Signed   By: Genevie Ann M.D.   On: 05/05/2017 08:26    Microbiology: No results found for this or any previous visit (from the past 240 hour(s)).   Labs: Basic Metabolic Panel: Recent Labs  Lab 05/04/17 1517 05/06/17 0945 05/07/17 1114  NA 125* 130* 131*  K 5.0 4.0 4.3  CL 87* 92* 94*  CO2 23 22 25   GLUCOSE 706* 198* 251*  BUN 37* 49* 29*  CREATININE 9.02* 11.52* 8.19*  CALCIUM 8.5* 8.4* 8.4*  PHOS  --  6.4* 4.9*   Liver Function Tests: Recent Labs  Lab 05/06/17 0945 05/07/17 1114  ALBUMIN 3.0* 2.8*   No results for input(s): LIPASE, AMYLASE in the last 168 hours. No  results for input(s): AMMONIA in the last 168 hours. CBC: Recent Labs  Lab 05/04/17 1517 05/06/17 0945 05/07/17 1114  WBC 6.4 6.3 5.5  HGB 11.1* 10.6* 11.1*  HCT 33.7* 33.2* 35.1*  MCV 91.1 91.5 94.1  PLT 214 240 277   Cardiac Enzymes: Recent Labs  Lab 05/05/17 0154 05/05/17 1003 05/05/17 1445  TROPONINI <0.03 <0.03 <0.03   BNP: BNP (last 3 results) No results for input(s): BNP in the last 8760 hours.  ProBNP (last 3 results) No results for input(s): PROBNP in the last 8760 hours.  CBG: Recent Labs  Lab 05/06/17 1354 05/06/17 1654 05/06/17 2033 05/07/17 0744 05/07/17 1111  GLUCAP 159* 225* 243* 156* 225*       Signed:  Annita Brod, MD [facility] 05/07/2017, 12:46 PM

## 2017-05-22 ENCOUNTER — Ambulatory Visit: Payer: BLUE CROSS/BLUE SHIELD | Admitting: Endocrinology

## 2017-05-23 ENCOUNTER — Ambulatory Visit: Payer: BLUE CROSS/BLUE SHIELD | Admitting: Cardiovascular Disease

## 2017-05-29 ENCOUNTER — Ambulatory Visit: Payer: BLUE CROSS/BLUE SHIELD | Admitting: Endocrinology

## 2017-05-30 ENCOUNTER — Other Ambulatory Visit (HOSPITAL_COMMUNITY): Payer: Self-pay | Admitting: Physician Assistant

## 2017-05-30 DIAGNOSIS — K3184 Gastroparesis: Secondary | ICD-10-CM

## 2017-05-30 DIAGNOSIS — R112 Nausea with vomiting, unspecified: Secondary | ICD-10-CM

## 2017-06-19 ENCOUNTER — Ambulatory Visit: Payer: BLUE CROSS/BLUE SHIELD | Admitting: Endocrinology

## 2017-06-19 ENCOUNTER — Ambulatory Visit (HOSPITAL_COMMUNITY)
Admission: RE | Admit: 2017-06-19 | Discharge: 2017-06-19 | Disposition: A | Discharge status: Home / Self Care | Payer: BLUE CROSS/BLUE SHIELD | Source: Ambulatory Visit | Attending: Physician Assistant | Admitting: Physician Assistant

## 2017-06-19 DIAGNOSIS — R112 Nausea with vomiting, unspecified: Secondary | ICD-10-CM | POA: Diagnosis not present

## 2017-06-19 DIAGNOSIS — K3184 Gastroparesis: Secondary | ICD-10-CM | POA: Diagnosis present

## 2017-06-19 DIAGNOSIS — Z0289 Encounter for other administrative examinations: Secondary | ICD-10-CM

## 2017-06-19 MED ORDER — TECHNETIUM TC 99M SULFUR COLLOID
2.0000 | Freq: Once | INTRAVENOUS | Status: AC | PRN
  Administered 2017-06-19: 2 via ORAL

## 2017-06-21 ENCOUNTER — Encounter (HOSPITAL_COMMUNITY): Payer: BLUE CROSS/BLUE SHIELD

## 2017-06-21 ENCOUNTER — Ambulatory Visit: Payer: BLUE CROSS/BLUE SHIELD | Admitting: Endocrinology

## 2017-06-21 ENCOUNTER — Encounter: Payer: Self-pay | Admitting: Endocrinology

## 2017-06-21 VITALS — BP 122/72 | HR 93 | Wt 200.8 lb

## 2017-06-21 DIAGNOSIS — E1122 Type 2 diabetes mellitus with diabetic chronic kidney disease: Secondary | ICD-10-CM | POA: Diagnosis not present

## 2017-06-21 DIAGNOSIS — Z794 Long term (current) use of insulin: Secondary | ICD-10-CM

## 2017-06-21 DIAGNOSIS — Z992 Dependence on renal dialysis: Secondary | ICD-10-CM

## 2017-06-21 DIAGNOSIS — N186 End stage renal disease: Secondary | ICD-10-CM | POA: Diagnosis not present

## 2017-06-21 LAB — POCT GLYCOSYLATED HEMOGLOBIN (HGB A1C): HEMOGLOBIN A1C: 8.5

## 2017-06-21 MED ORDER — INSULIN NPH (HUMAN) (ISOPHANE) 100 UNIT/ML ~~LOC~~ SUSP: 40.0000 [IU] | SUBCUTANEOUS | Status: DC

## 2017-06-21 NOTE — Patient Instructions (Addendum)
I have sent a prescription to your pharmacy, to increase the NPH insulin to 40 units each morning.  In view of your medical condition, you should avoid pregnancy until we have decided it is safe.   check your blood sugar 5 times a day: before the 3 meals, and at bedtime.  also check after meals, on a rotating basis.  if you have symptoms of your blood sugar being too high or too low.  please keep a record of the readings and bring it to your next appointment here (or you can bring the meter itself).  You can write it on any piece of paper.  please call us sooner if your blood sugar goes below 70, or if you have a lot of readings over 200.  Please come back for a follow-up appointment in 2 months.

## 2017-06-21 NOTE — Progress Notes (Signed)
Subjective:    Patient ID: Carrie [name], female    DOB: 11-12-1980, 37 y.o.   MRN: [id number]  HPI Pt returns for f/u of diabetes mellitus: DM type: Insulin-requiring type 2 Dx'ed: 6812 Complications: polyneuropathy, renal insufficiency, and retinopathy.   Therapy: insulin since 2003 pregnancy.  GDM: never DKA: never Severe hypoglycemia: last episode was early 2018.  Pancreatitis: never.  Pancreatic imaging: normal on 2018 CT Other: she declines multiple daily injections Interval history:  No cbg record, but she says cbg varies from 175-300's.  It is in general higher as the day goes on.  pt states she feels well in general.  Past Medical History:  Diagnosis Date  . Anemia   . Diabetic neuropathy ([place])   . Diabetic retinopathy ([place])   . ESRD (end stage renal disease) on dialysis [facility])    "MWF; [place]" (02/02/2017)  . Gastroparesis   . HA (headache)    "qd if I didn't take my RX" (02/02/2017)  . Hemodialysis access site with arteriovenous graft ([place])   . Hypertension   . Neuropathy   . Renal insufficiency   . Trigeminal neuralgia   . Type I diabetes mellitus ([place])     Past Surgical History:  Procedure Laterality Date  . AV FISTULA PLACEMENT Left 09/07/2016   Procedure: Left arm 1st Stage Basilic AV Fistula;  Surgeon: [name], MD;  Location: [facility] OR;  Service: Vascular;  Laterality: Left;  . DILATION AND CURETTAGE OF UTERUS    . EXCISIONAL HEMORRHOIDECTOMY    . EYE SURGERY    . INSERTION OF DIALYSIS CATHETER Right 09/07/2016   Procedure: INSERTION OF DIALYSIS CATHETER;  Surgeon: [name], MD;  Location: [facility] OR;  Service: Vascular;  Laterality: Right;  . RETINAL LASER PROCEDURE Bilateral    "related to diabetes"  . REVISON OF ARTERIOVENOUS FISTULA Left 01/15/2017   Procedure: INSERTION OF  AV GORE-TEX GRAFT;  Surgeon: Elam Dutch, MD;  Location: [place];  Service: Vascular;  Laterality: Left;  . TEE WITHOUT CARDIOVERSION N/A 01/11/2017   Procedure:  TRANSESOPHAGEAL ECHOCARDIOGRAM (TEE);  Surgeon: Jerline Pain, MD;  Location[facility] ENDOSCOPY;  Service: Cardiovascular;  Laterality: N/A;    Social History   Socioeconomic History  . Marital status: Single    Spouse name: Not on file  . Number of children: 1  . Years of education: 77  . Highest education level: Not on file  Social Needs  . Financial resource strain: Not on file  . Food insecurity - worry: Not on file  . Food insecurity - inability: Not on file  . Transportation needs - medical: Not on file  . Transportation needs - non-medical: Not on file  Occupational History  . Occupation: Call Center    Employer: [employer]  Tobacco Use  . Smoking status: Never Smoker  . Smokeless tobacco: Never Used  Substance and Sexual Activity  . Alcohol use: Yes    Alcohol/week: 2.0 oz    Types: 4 Standard drinks or equivalent per week    Comment: 02/02/2017 "nothing in the last 6 months; never had problem w/it"  . Drug use: No  . Sexual activity: Yes  Other Topics Concern  . Not on file  Social History Narrative   Patient lives at home with her father and daughter.    Education some college.   Right handed   Caffeine Five sodas daily.          Current Outpatient Medications on File Prior  to Visit  Medication Sig Dispense Refill  . carbamazepine (TEGRETOL) 200 MG tablet Take 1 tablet (200 mg total) by mouth at bedtime.  1  . diclofenac sodium (VOLTAREN) 1 % GEL Apply 2 g topically 4 (four) times daily. 1 Tube 0  . diphenhydramine-acetaminophen (TYLENOL PM) 25-500 MG TABS tablet Take 2 tablets by mouth at bedtime as needed.     . gabapentin (NEURONTIN) 300 MG capsule Take 1 capsule (300 mg total) by mouth at bedtime. (Patient taking differently: Take 900 mg by mouth at bedtime. )    . labetalol (NORMODYNE) 300 MG tablet Take 300 mg by mouth See admin instructions. Take 300mg  tablet at bedtime on diaylsis days M, W, F. Take 300mg  tablet twice daily on all other days  0  .  losartan (COZAAR) 50 MG tablet Take 50 mg by mouth daily.    . metoCLOPramide (REGLAN) 5 MG tablet Take 5 mg by mouth 3 (three) times daily with meals.     . ondansetron (ZOFRAN) 4 MG tablet Take 1 tablet (4 mg total) by mouth every 8 (eight) hours as needed for nausea or vomiting. 20 tablet 0  . promethazine (PHENERGAN) 25 MG tablet Take 1 tablet (25 mg total) by mouth every 8 (eight) hours as needed for nausea or vomiting. 10 tablet 0  . rosuvastatin (CRESTOR) 10 MG tablet Take 10 mg by mouth at bedtime.  1  . sevelamer carbonate (RENVELA) 800 MG tablet Take 800 mg by mouth 3 (three) times daily with meals.    . warfarin (COUMADIN) 5 MG tablet Take 5 mg by mouth See admin instructions. Per pt. She takes 7.5mg  on Tuesday and Thursday, and 10mg  on Mondays, Wednesday, Friday, Saturday, and Sunday.     No current facility-administered medications on file prior to visit.     Allergies  Allergen Reactions  . No Known Allergies     Family History  Problem Relation Age of Onset  . Hyperlipidemia Mother   . Asthma Daughter   . Diabetes Maternal Grandmother   . Stroke Maternal Grandmother   . Kidney disease Maternal Grandfather   . Kidney disease Paternal Grandmother     BP 122/72 (BP Location: Right Arm, Patient Position: Sitting, Cuff Size: Normal)   Pulse 93   Wt 200 lb 12.8 oz (91.1 kg)   LMP 07/17/2016 (LMP Unknown) Comment: Has not had a period since starting dialysis in May  SpO2 97%   BMI 30.53 kg/m    Review of Systems She denies hypoglycemia    Objective:   Physical Exam VITAL SIGNS:  See vs page GENERAL: no distress.  Pulses: foot pulses are intact bilaterally.   MSK: no deformity of the feet or ankles.  CV: no edema of the legs or ankles Skin:  no ulcer on the feet or ankles.  normal color and temp on the feet and ankles Neuro: sensation is intact to touch on the feet and ankles, but decreased from normal Ext: There is bilateral onychomycosis of the toenails.  Left  great toenail is bandaged   Lab Results  Component Value Date   HGBA1C 8.5 06/21/2017       Assessment & Plan:  Insulin-requiring type 2 DM: she needs increased rx   Patient Instructions  I have sent a prescription to your pharmacy, to increase the NPH insulin to 40 units each morning.  In view of your medical condition, you should avoid pregnancy until we have decided it is safe.   check your  blood sugar 5 times a day: before the 3 meals, and at bedtime.  also check after meals, on a rotating basis.  if you have symptoms of your blood sugar being too high or too low.  please keep a record of the readings and bring it to your next appointment here (or you can bring the meter itself).  You can write it on any piece of paper.  please call us sooner if your blood sugar goes below 70, or if you have a lot of readings over 200.  Please come back for a follow-up appointment in 2 months.

## 2017-07-25 ENCOUNTER — Other Ambulatory Visit (HOSPITAL_COMMUNITY): Payer: Self-pay | Admitting: Nephrology

## 2017-07-25 ENCOUNTER — Other Ambulatory Visit: Payer: Self-pay | Admitting: Nephrology

## 2017-07-25 DIAGNOSIS — Z01818 Encounter for other preprocedural examination: Secondary | ICD-10-CM

## 2017-07-31 ENCOUNTER — Other Ambulatory Visit (HOSPITAL_COMMUNITY): Payer: BLUE CROSS/BLUE SHIELD

## 2017-08-09 ENCOUNTER — Telehealth (HOSPITAL_COMMUNITY): Payer: Self-pay | Admitting: *Deleted

## 2017-08-09 NOTE — Telephone Encounter (Signed)
Left message on voicemail in reference to upcoming appointment scheduled for 08/14/17. Phone number given for a call back so details instructions can be given. Carrie [name]

## 2017-08-14 ENCOUNTER — Other Ambulatory Visit (HOSPITAL_COMMUNITY): Payer: BLUE CROSS/BLUE SHIELD

## 2017-08-14 ENCOUNTER — Ambulatory Visit (HOSPITAL_BASED_OUTPATIENT_CLINIC_OR_DEPARTMENT_OTHER): Payer: BLUE CROSS/BLUE SHIELD

## 2017-08-14 ENCOUNTER — Ambulatory Visit (HOSPITAL_COMMUNITY): Payer: BLUE CROSS/BLUE SHIELD | Attending: Cardiovascular Disease

## 2017-08-14 ENCOUNTER — Encounter (HOSPITAL_COMMUNITY): Payer: Self-pay | Admitting: *Deleted

## 2017-08-14 DIAGNOSIS — Z01818 Encounter for other preprocedural examination: Secondary | ICD-10-CM | POA: Insufficient documentation

## 2017-08-14 NOTE — Progress Notes (Unsigned)
Patient unable to proceed with treadmill protocol due to neuropathy and numbness in feet.  Test terminated at 1:59 minutes due to safety concerns and patient request.  Additional stress testing options are available, Please advise patient as to further testing needs.      Thank You, Deliah Boston, RDCS

## 2017-08-15 ENCOUNTER — Telehealth: Payer: Self-pay | Admitting: Endocrinology

## 2017-08-15 MED ORDER — INSULIN ASPART PROT & ASPART (70-30 MIX) 100 UNIT/ML PEN: 40.0000 [IU] | PEN_INJECTOR | Freq: Every day | SUBCUTANEOUS | 11 refills | Status: DC

## 2017-08-15 NOTE — Telephone Encounter (Signed)
I have sent a prescription to your pharmacy, to change to 70/30, with breakfast: same dosage.

## 2017-08-15 NOTE — Telephone Encounter (Signed)
I called and notified patient of medication change.

## 2017-08-21 ENCOUNTER — Encounter: Payer: Self-pay | Admitting: Endocrinology

## 2017-08-21 ENCOUNTER — Ambulatory Visit: Payer: BLUE CROSS/BLUE SHIELD | Admitting: Endocrinology

## 2017-08-21 VITALS — BP 120/74 | HR 97 | Wt 204.6 lb

## 2017-08-21 DIAGNOSIS — N186 End stage renal disease: Secondary | ICD-10-CM

## 2017-08-21 DIAGNOSIS — Z992 Dependence on renal dialysis: Secondary | ICD-10-CM

## 2017-08-21 DIAGNOSIS — E1122 Type 2 diabetes mellitus with diabetic chronic kidney disease: Secondary | ICD-10-CM

## 2017-08-21 DIAGNOSIS — Z794 Long term (current) use of insulin: Secondary | ICD-10-CM | POA: Diagnosis not present

## 2017-08-21 LAB — POCT GLYCOSYLATED HEMOGLOBIN (HGB A1C): Hemoglobin A1C: 7.9

## 2017-08-21 MED ORDER — INSULIN ASPART PROT & ASPART (70-30 MIX) 100 UNIT/ML PEN: 45.0000 [IU] | PEN_INJECTOR | Freq: Every day | SUBCUTANEOUS | 11 refills | Status: DC

## 2017-08-21 NOTE — Patient Instructions (Addendum)
Please take 35 units with breakfast on dialysis days, and 45 units (also with breakfast), on off days.   In view of your medical condition, you should avoid pregnancy until we have decided it is safe.   check your blood sugar 5 times a day: before the 3 meals, and at bedtime.  also check after meals, on a rotating basis.  if you have symptoms of your blood sugar being too high or too low.  please keep a record of the readings and bring it to your next appointment here (or you can bring the meter itself).  You can write it on any piece of paper.  please call us sooner if your blood sugar goes below 70, or if you have a lot of readings over 200.  Please come back for a follow-up appointment in 2 months.    

## 2017-08-21 NOTE — Progress Notes (Signed)
Subjective:    Patient ID: Carrie [name], female    DOB: Feb 15, 1981, 37 y.o.   MRN: [id number]  HPI Pt returns for f/u of diabetes mellitus: DM type: Insulin-requiring type 2.   Dx'ed: 9381 Complications: polyneuropathy, renal insufficiency, and retinopathy.   Therapy: insulin since 2003 pregnancy.  GDM: never DKA: never Severe hypoglycemia: last episode was early 2018.  Pancreatitis: never.  Pancreatic imaging: normal on 2018 CT Other: she declines multiple daily injections Interval history:  No cbg record, but she says cbg varies from 30-400.  pt states she feels well in general.  Last week, she had an episode of severe hypoglycemia.  It is in general lower on HD days than off days.  She has weight gain.  She misses the insulin a few times per month.   Past Medical History:  Diagnosis Date  . Anemia   . Diabetic neuropathy ([place])   . Diabetic retinopathy ([place])   . ESRD (end stage renal disease) on dialysis [facility])    "MWF; [place]" (02/02/2017)  . Gastroparesis   . HA (headache)    "qd if I didn't take my RX" (02/02/2017)  . Hemodialysis access site with arteriovenous graft ([place])   . Hypertension   . Neuropathy   . Renal insufficiency   . Trigeminal neuralgia   . Type I diabetes mellitus ([place])     Past Surgical History:  Procedure Laterality Date  . AV FISTULA PLACEMENT Left 09/07/2016   Procedure: Left arm 1st Stage Basilic AV Fistula;  Surgeon: [name], MD;  Location: [facility] OR;  Service: Vascular;  Laterality: Left;  . DILATION AND CURETTAGE OF UTERUS    . EXCISIONAL HEMORRHOIDECTOMY    . EYE SURGERY    . INSERTION OF DIALYSIS CATHETER Right 09/07/2016   Procedure: INSERTION OF DIALYSIS CATHETER;  Surgeon: [name], MD;  Location: [facility] OR;  Service: Vascular;  Laterality: Right;  . RETINAL LASER PROCEDURE Bilateral    "related to diabetes"  . REVISON OF ARTERIOVENOUS FISTULA Left 01/15/2017   Procedure: INSERTION OF  AV GORE-TEX GRAFT;  Surgeon: Elam Dutch, MD;  Location: [place];  Service: Vascular;  Laterality: Left;  . TEE WITHOUT CARDIOVERSION N/A 01/11/2017   Procedure: TRANSESOPHAGEAL ECHOCARDIOGRAM (TEE);  Surgeon: Jerline Pain, MD;  Location: [facility] ENDOSCOPY;  Service: Cardiovascular;  Laterality: N/A;    Social History   Socioeconomic History  . Marital status: Single    Spouse name: Not on file  . Number of children: 1  . Years of education: 20  . Highest education level: Not on file  Occupational History  . Occupation: Call Center    Employer: [employer]  . Financial resource strain: Not on file  . Food insecurity:    Worry: Not on file    Inability: Not on file  . Transportation needs:    Medical: Not on file    Non-medical: Not on file  Tobacco Use  . Smoking status: Never Smoker  . Smokeless tobacco: Never Used  Substance and Sexual Activity  . Alcohol use: Yes    Alcohol/week: 2.0 oz    Types: 4 Standard drinks or equivalent per week    Comment: 02/02/2017 "nothing in the last 6 months; never had problem w/it"  . Drug use: No  . Sexual activity: Yes  Lifestyle  . Physical activity:    Days per week: Not on file    Minutes per session: Not on file  .  Stress: Not on file  Relationships  . Social connections:    Talks on phone: Not on file    Gets together: Not on file    Attends religious service: Not on file    Active member of club or organization: Not on file    Attends meetings of clubs or organizations: Not on file    Relationship status: Not on file  . Intimate partner violence:    Fear of current or ex partner: Not on file    Emotionally abused: Not on file    Physically abused: Not on file    Forced sexual activity: Not on file  Other Topics Concern  . Not on file  Social History Narrative   Patient lives at home with her father and daughter.    Education some college.   Right handed   Caffeine Five sodas daily.          Current Outpatient Medications on File  Prior to Visit  Medication Sig Dispense Refill  . carbamazepine (TEGRETOL) 200 MG tablet Take 1 tablet (200 mg total) by mouth at bedtime.  1  . diphenhydramine-acetaminophen (TYLENOL PM) 25-500 MG TABS tablet Take 2 tablets by mouth at bedtime as needed.     . gabapentin (NEURONTIN) 300 MG capsule Take 1 capsule (300 mg total) by mouth at bedtime. (Patient taking differently: Take 900 mg by mouth at bedtime. )    . labetalol (NORMODYNE) 300 MG tablet Take 300 mg by mouth See admin instructions. Take 300mg  tablet at bedtime on diaylsis days M, W, F. Take 300mg  tablet twice daily on all other days  0  . metoCLOPramide (REGLAN) 5 MG tablet Take 5 mg by mouth 3 (three) times daily with meals.     . ondansetron (ZOFRAN) 4 MG tablet Take 1 tablet (4 mg total) by mouth every 8 (eight) hours as needed for nausea or vomiting. 20 tablet 0  . promethazine (PHENERGAN) 25 MG tablet Take 1 tablet (25 mg total) by mouth every 8 (eight) hours as needed for nausea or vomiting. 10 tablet 0  . rosuvastatin (CRESTOR) 10 MG tablet Take 10 mg by mouth at bedtime.  1  . sevelamer carbonate (RENVELA) 800 MG tablet Take 800 mg by mouth 3 (three) times daily with meals.    . diclofenac sodium (VOLTAREN) 1 % GEL Apply 2 g topically 4 (four) times daily. (Patient not taking: Reported on 08/21/2017) 1 Tube 0  . losartan (COZAAR) 50 MG tablet Take 50 mg by mouth daily.    Marland Kitchen warfarin (COUMADIN) 5 MG tablet Take 5 mg by mouth See admin instructions. Per pt. She takes 7.5mg  on Tuesday and Thursday, and 10mg  on Mondays, Wednesday, Friday, Saturday, and Sunday.     No current facility-administered medications on file prior to visit.     Allergies  Allergen Reactions  . No Known Allergies     Family History  Problem Relation Age of Onset  . Hyperlipidemia Mother   . Asthma Daughter   . Diabetes Maternal Grandmother   . Stroke Maternal Grandmother   . Kidney disease Maternal Grandfather   . Kidney disease Paternal  Grandmother     BP 120/74   Pulse 97   Wt 204 lb 9.6 oz (92.8 kg)   LMP 07/17/2016 (Within Weeks) Comment: Has not had a period since starting dialysis in May  SpO2 98%   BMI 31.11 kg/m    Review of Systems Denies weight change    Objective:  Physical Exam VITAL SIGNS:  See vs page GENERAL: no distress.  Pulses: foot pulses are intact bilaterally.   MSK: no deformity of the feet or ankles.  CV: no edema of the legs or ankles Skin:  no ulcer on the feet or ankles.  normal color and temp on the feet and ankles Neuro: sensation is intact to touch on the feet and ankles, but decreased from normal Ext: There is bilateral onychomycosis of the toenails.  Left great toenail is bandaged   Lab Results  Component Value Date   HGBA1C 7.9 08/21/2017      Assessment & Plan:  Insulin-requiring type 2 DM, with DR: she needs increased rx, if it can be done with a regimen that avoids or minimizes hypoglycemia. ESRD: she needs to vary insulin according to HD schedule Noncompliance with cbg recording and insulin.  We discussed risks.    Patient Instructions  Please take 35 units with breakfast on dialysis days, and 45 units (also with breakfast), on off days.   In view of your medical condition, you should avoid pregnancy until we have decided it is safe.   check your blood sugar 5 times a day: before the 3 meals, and at bedtime.  also check after meals, on a rotating basis.  if you have symptoms of your blood sugar being too high or too low.  please keep a record of the readings and bring it to your next appointment here (or you can bring the meter itself).  You can write it on any piece of paper.  please call us sooner if your blood sugar goes below 70, or if you have a lot of readings over 200.  Please come back for a follow-up appointment in 2 months.

## 2017-08-22 ENCOUNTER — Other Ambulatory Visit: Payer: Self-pay

## 2017-08-30 ENCOUNTER — Telehealth: Payer: Self-pay | Admitting: Endocrinology

## 2017-08-30 NOTE — Telephone Encounter (Signed)
please call patient: Ins declined humulin N pen, but we changed to 70/30 pen.  Were you able to pick that up?

## 2017-08-30 NOTE — Telephone Encounter (Signed)
Patient stated that she was able to pick up 70/30 pen & that is what she has currently been taking. She did say that she had an episode day before yesterday where she passed out from her blood sugar being too low. She stated that she was reducing insulin to 30 units then 40 units instead of the 35 & 45 units she was prescribed.

## 2017-08-30 NOTE — Telephone Encounter (Signed)
Ok, please continue the reduced amount.  I'll see you next time.

## 2017-09-24 ENCOUNTER — Encounter (HOSPITAL_COMMUNITY): Payer: Self-pay | Admitting: Emergency Medicine

## 2017-09-24 ENCOUNTER — Emergency Department (HOSPITAL_COMMUNITY)
Admission: EM | Admit: 2017-09-24 | Discharge: 2017-09-24 | Disposition: A | Discharge status: Home / Self Care | Payer: BLUE CROSS/BLUE SHIELD | Attending: Emergency Medicine | Admitting: Emergency Medicine

## 2017-09-24 ENCOUNTER — Other Ambulatory Visit: Payer: Self-pay

## 2017-09-24 DIAGNOSIS — E1022 Type 1 diabetes mellitus with diabetic chronic kidney disease: Secondary | ICD-10-CM | POA: Insufficient documentation

## 2017-09-24 DIAGNOSIS — Z992 Dependence on renal dialysis: Secondary | ICD-10-CM | POA: Diagnosis not present

## 2017-09-24 DIAGNOSIS — Z79899 Other long term (current) drug therapy: Secondary | ICD-10-CM | POA: Insufficient documentation

## 2017-09-24 DIAGNOSIS — R55 Syncope and collapse: Secondary | ICD-10-CM | POA: Insufficient documentation

## 2017-09-24 DIAGNOSIS — Z794 Long term (current) use of insulin: Secondary | ICD-10-CM | POA: Diagnosis not present

## 2017-09-24 DIAGNOSIS — E10319 Type 1 diabetes mellitus with unspecified diabetic retinopathy without macular edema: Secondary | ICD-10-CM | POA: Insufficient documentation

## 2017-09-24 DIAGNOSIS — N186 End stage renal disease: Secondary | ICD-10-CM | POA: Insufficient documentation

## 2017-09-24 DIAGNOSIS — I120 Hypertensive chronic kidney disease with stage 5 chronic kidney disease or end stage renal disease: Secondary | ICD-10-CM | POA: Diagnosis not present

## 2017-09-24 DIAGNOSIS — E1040 Type 1 diabetes mellitus with diabetic neuropathy, unspecified: Secondary | ICD-10-CM | POA: Diagnosis not present

## 2017-09-24 DIAGNOSIS — R4020 Unspecified coma: Secondary | ICD-10-CM

## 2017-09-24 DIAGNOSIS — E162 Hypoglycemia, unspecified: Secondary | ICD-10-CM

## 2017-09-24 LAB — CBG MONITORING, ED
Glucose-Capillary: 76 mg/dL (ref 65–99)
Glucose-Capillary: 189 mg/dL — ABNORMAL HIGH (ref 65–99)

## 2017-09-24 NOTE — Discharge Instructions (Addendum)
Please reduce your insulin to 25 units on dialysis days, 35 units non-dialysis days. Please continue to chest your blood sugars often. Please call your endocrinologist to set up a follow up appointment.

## 2017-09-24 NOTE — ED Notes (Signed)
Pt's CBG reported to the resident. Pt given happy meal, graham crackers & peanut butter and diet Sprite.

## 2017-09-24 NOTE — ED Triage Notes (Signed)
Pt BIB [facility] after syncopal episode. Pt had full dialysis treatment today, on the way home she had +LOC, states she woke up to her dad giving her orange juice. EMS CBG 88, pt reports taking insulin prior to dialysis. A&O x 4.

## 2017-09-24 NOTE — ED Provider Notes (Signed)
[facility] EMERGENCY DEPARTMENT Provider Note   CSN: [id number] Arrival date & time: 09/24/17  1851   History   Chief Complaint Chief Complaint  Patient presents with  . Loss of Consciousness    HPI JANETTE HARVIE is a 37 y.o. female.  BS at 1600 was 103, this was checked during a dialysis session. When she drove home at 1700, felt shaky. Woke up in her car in the driveway to her father handing her orange juice. BS after OJ ingested was 88. On EMS arrival, BS 133. States similar episodes 4 times over past 2 months, last time was 2 weeks ago. BS found to be 46 at the time. Stable dose of insulin, has not lost weight or changed diet substantially.   The history is provided by the patient.  Loss of Consciousness   This is a recurrent (has occured 4 times over last 2 months) problem. The current episode started 1 to 2 hours ago. The problem has been resolved. Length of episode of loss of consciousness: unknown, likely just a few minutes. Associated with: low blood sugar. Pertinent negatives include abdominal pain, back pain, chest pain, fever, palpitations, seizures and vomiting. Associated symptoms comments: chills. She has tried sugar/glucose for the symptoms. The treatment provided significant relief. Her past medical history is significant for DM and HTN. Past medical history comments: h/o ESRD (MWF), DM.    Past Medical History:  Diagnosis Date  . Anemia   . Diabetic neuropathy ([place])   . Diabetic retinopathy ([place])   . ESRD (end stage renal disease) on dialysis [facility])    "MWF; [place]" (02/02/2017)  . Gastroparesis   . HA (headache)    "qd if I didn't take my RX" (02/02/2017)  . Hemodialysis access site with arteriovenous graft ([place])   . Hypertension   . Neuropathy   . Renal insufficiency   . Trigeminal neuralgia   . Type I diabetes mellitus [facility])     Patient Active Problem List   Diagnosis Date Noted  . Chest pain 05/04/2017  . ESRD (end stage renal  disease) ([place]) 02/01/2017  . 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 Pregnancy test positive 12/22/2016  . Amenorrhea 12/12/2016  . Diabetes ([place]) 09/16/2016  . ESRD on hemodialysis ([place])   . Gastroparesis   . Intractable vomiting 09/04/2016  . 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 Intractable vomiting with nausea 04/04/2015  . AKI (acute kidney injury) ([place]) 04/04/2015  . DKA (diabetic ketoacidoses) ([place]) 04/03/2015  . Trigeminal neuralgia 12/12/2013  . HA (headache)   . DKA, type 2 ([place]) 10/24/2012  . Contact dermatitis 10/24/2012  . SORE THROAT 03/22/2010  . 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 FATIGUE 05/25/2009  . VAGINAL DISCHARGE 01/28/2009  . Carbuncle and furuncle of unspecified site 12/29/2008  . HYPERCHOLESTEROLEMIA 06/12/2008  . MICROALBUMINURIA 06/12/2008  . MONILIAL VAGINITIS 11/14/2007  . INGUINAL PAIN, BILATERAL 11/14/2007  . DYSURIA 10/17/2007  . CYSTITIS 09/05/2007  . DELAYED MENSES 09/05/2007  . RHINOCONJUNCTIVITIS, ALLERGIC 07/19/2007  . DENTAL PAIN 03/28/2007  . HYPERLIPIDEMIA 09/01/2006  . PELVIC INFLAMMATORY DISEASE 09/01/2006  . ABORTION, SPONTANEOUS 09/01/2006  . ABSCESS 09/01/2006  . HX, PERSONAL, PAST NONCOMPLIANCE 09/01/2006    Past Surgical History:  Procedure Laterality Date  . AV FISTULA PLACEMENT Left 09/07/2016   Procedure: Left arm 1st Stage Basilic AV Fistula;  Surgeon: [name], MD;  Location: [facility] OR;  Service: Vascular;  Laterality: Left;  . DILATION AND CURETTAGE  OF UTERUS    . EXCISIONAL HEMORRHOIDECTOMY    . EYE SURGERY    . INSERTION OF DIALYSIS CATHETER Right 09/07/2016   Procedure: INSERTION OF DIALYSIS CATHETER;  Surgeon: [name], MD;  Location: [facility] OR;  Service: Vascular;  Laterality: Right;  . RETINAL LASER PROCEDURE Bilateral    "related to diabetes"  . REVISON OF ARTERIOVENOUS FISTULA Left 01/15/2017   Procedure: INSERTION OF  AV GORE-TEX GRAFT;  Surgeon: Elam Dutch, MD;  Location: [facility];   Service: Vascular;  Laterality: Left;  . TEE WITHOUT CARDIOVERSION N/A 01/11/2017   Procedure: TRANSESOPHAGEAL ECHOCARDIOGRAM (TEE);  Surgeon: Jerline Pain, MD;  Location: [facility] ENDOSCOPY;  Service: Cardiovascular;  Laterality: N/A;     OB History    Gravida  7   Para  1   Term      Preterm  1   AB  5   Living  1     SAB  2   TAB  3   Ectopic      Multiple      Live Births               Home Medications    Prior to Admission medications   Medication Sig Start Date End Date Taking? Authorizing Provider  carbamazepine (TEGRETOL) 200 MG tablet Take 1 tablet (200 mg total) by mouth at bedtime. 05/07/17   Annita Brod, MD  diclofenac sodium (VOLTAREN) 1 % GEL Apply 2 g topically 4 (four) times daily. Patient not taking: Reported on 08/21/2017 01/24/17   Melanee Spry, MD  diphenhydramine-acetaminophen (TYLENOL PM) 25-500 MG TABS tablet Take 2 tablets by mouth at bedtime as needed.     [provider]  gabapentin (NEURONTIN) 300 MG capsule Take 1 capsule (300 mg total) by mouth at bedtime. Patient taking differently: Take 900 mg by mouth at bedtime.  09/12/16   Rosita Fire, MD  insulin aspart protamine - aspart (NOVOLOG MIX 70/30 FLEXPEN) (70-30) 100 UNIT/ML FlexPen Inject 0.45 mLs (45 Units total) into the skin daily with breakfast. And pen needles 1/day 08/21/17   Renato Shin, MD  labetalol (NORMODYNE) 300 MG tablet Take 300 mg by mouth See admin instructions. Take 300mg  tablet at bedtime on diaylsis days M, W, F. Take 300mg  tablet twice daily on all other days 08/25/16   [provider]  losartan (COZAAR) 50 MG tablet Take 50 mg by mouth daily.    [provider]  metoCLOPramide (REGLAN) 5 MG tablet Take 5 mg by mouth 3 (three) times daily with meals.     [provider]  ondansetron (ZOFRAN) 4 MG tablet Take 1 tablet (4 mg total) by mouth every 8 (eight) hours as needed for nausea or vomiting. 09/12/16   Rosita Fire, MD  promethazine (PHENERGAN) 25 MG tablet Take 1 tablet (25 mg total) by mouth every 8 (eight) hours as needed for nausea or vomiting. 09/12/16   Rosita Fire, MD  rosuvastatin (CRESTOR) 10 MG tablet Take 10 mg by mouth at bedtime. 03/30/17   [provider]  sevelamer carbonate (RENVELA) 800 MG tablet Take 800 mg by mouth 3 (three) times daily with meals.    [provider]  warfarin (COUMADIN) 5 MG tablet Take 5 mg by mouth See admin instructions. Per pt. She takes 7.5mg  on Tuesday and Thursday, and 10mg  on Mondays, Wednesday, Friday, Saturday, and Sunday.    [provider]    Family History Family History  Problem Relation Age of Onset  . Hyperlipidemia Mother   . Asthma Daughter   . Diabetes Maternal Grandmother   . Stroke Maternal Grandmother   . Kidney disease Maternal Grandfather   . Kidney disease Paternal Grandmother     Social History Social History   Tobacco Use  . Smoking status: Never Smoker  . Smokeless tobacco: Never Used  Substance Use Topics  . Alcohol use: Yes    Alcohol/week: 2.0 oz    Types: 4 Standard drinks or equivalent per week    Comment: 02/02/2017 "nothing in the last 6 months; never had problem w/it"  . Drug use: No     Allergies   No known allergies   Review of Systems Review of Systems  Constitutional: Positive for chills. Negative for fever.  HENT: Negative for ear pain and sore throat.   Eyes: Negative for pain and visual disturbance.  Respiratory: Negative for cough and shortness of breath.   Cardiovascular: Positive for syncope. Negative for chest pain and palpitations.  Gastrointestinal: Negative for abdominal pain and vomiting.  Genitourinary: Negative for dysuria and hematuria.  Musculoskeletal: Negative for arthralgias and back pain.  Skin: Negative for color change and rash.  Neurological: Positive for syncope. Negative for seizures.  All other systems reviewed and are  negative.    Physical Exam Updated Vital Signs BP (!) 193/106   Pulse 88   Temp 98 F (36.7 C) (Oral)   Resp 15   Ht 5\' 8"  (1.727 m)   Wt 95.3 kg (210 lb)   LMP 07/17/2016 (Within Weeks) Comment: Has not had a period since starting dialysis in May  SpO2 100%   BMI 31.93 kg/m   Physical Exam  Constitutional: She is oriented to person, place, and time. She appears well-developed and well-nourished. No distress.  HENT:  Head: Normocephalic and atraumatic.  Mouth/Throat: Oropharynx is clear and moist.  Eyes: Conjunctivae and EOM are normal.  Neck: Normal range of motion. Neck supple.  Cardiovascular: Normal rate, regular rhythm and intact distal pulses.  No murmur heard. Pulmonary/Chest: Effort normal and breath sounds normal. No stridor. No respiratory distress.  Abdominal: Soft. She exhibits no distension. There is no tenderness. There is no guarding.  Musculoskeletal: She exhibits no edema.  Neurological: She is alert and oriented to person, place, and time. No cranial nerve deficit or sensory deficit.  Skin: Skin is warm and dry.  Psychiatric: She has a normal mood and affect.  Nursing note and vitals reviewed.    ED Treatments / Results  Labs (all labs ordered are listed, but only abnormal results are displayed) Labs Reviewed  CBG MONITORING, ED - Abnormal; Notable for the following components:      Result Value   Glucose-Capillary 189 (*)    All other components within normal limits  CBG MONITORING, ED    EKG None  Radiology No results found.  Procedures Procedures (including critical care time)  Medications Ordered in ED Medications - No data to display   Initial Impression / Assessment and Plan / ED Course  I have reviewed the triage vital signs and the nursing notes.  Pertinent labs & imaging results that were available during my care of the patient were reviewed by me and considered in my medical decision making (see chart for details).      JACQUILYN SELDON is a 37 y.o. female with PMHx of ESRD (MWF), DM who presents after recurrent syncopal events associated with low blood glucoses. Reviewed and confirmed nursing  documentation for past medical history, family history, social history. VS wnl. Exam unremarkable. All events, including today, related to hypoglycemia. No associated cardiopulmonary complaints, no concern for cardiac or neurologic etiology.    EKG wnl. CBG 79 and 189 on reecheck. Monitored in department without symptoms. Discussed plan to lower total insulin by 10 units per day- only 25 units on dialysis days, 35 units on non-dialysis days. Pt to f/u with endocrinologist tomorrow.   Old records reviewed. Labs reviewed by me and used in the medical decision making.   EKG reviewed by me and used in the medical decision making. D/c home in stable condition, return precautions discussed. Patient agreeable with plan for d/c home.   Final Clinical Impressions(s) / ED Diagnoses   Final diagnoses:  Loss of consciousness [facility])  Hypoglycemia    ED Discharge Orders    None       Norm Salt, MD 09/24/17 [place], [place], MD 09/26/17 [phone]

## 2017-10-08 ENCOUNTER — Inpatient Hospital Stay (HOSPITAL_COMMUNITY): Payer: BLUE CROSS/BLUE SHIELD

## 2017-10-08 ENCOUNTER — Inpatient Hospital Stay (HOSPITAL_COMMUNITY)
Admission: EM | Admit: 2017-10-08 | Discharge: 2017-10-10 | DRG: 073 | Disposition: A | Discharge status: Home / Self Care | Payer: BLUE CROSS/BLUE SHIELD | Attending: Internal Medicine | Admitting: Internal Medicine

## 2017-10-08 ENCOUNTER — Other Ambulatory Visit: Payer: Self-pay

## 2017-10-08 ENCOUNTER — Encounter (HOSPITAL_COMMUNITY): Payer: Self-pay | Admitting: Emergency Medicine

## 2017-10-08 DIAGNOSIS — Z992 Dependence on renal dialysis: Secondary | ICD-10-CM | POA: Diagnosis not present

## 2017-10-08 DIAGNOSIS — G500 Trigeminal neuralgia: Secondary | ICD-10-CM | POA: Diagnosis present

## 2017-10-08 DIAGNOSIS — E785 Hyperlipidemia, unspecified: Secondary | ICD-10-CM | POA: Diagnosis present

## 2017-10-08 DIAGNOSIS — N3000 Acute cystitis without hematuria: Secondary | ICD-10-CM | POA: Diagnosis not present

## 2017-10-08 DIAGNOSIS — D631 Anemia in chronic kidney disease: Secondary | ICD-10-CM | POA: Diagnosis present

## 2017-10-08 DIAGNOSIS — I160 Hypertensive urgency: Secondary | ICD-10-CM | POA: Diagnosis present

## 2017-10-08 DIAGNOSIS — E1043 Type 1 diabetes mellitus with diabetic autonomic (poly)neuropathy: Secondary | ICD-10-CM | POA: Diagnosis present

## 2017-10-08 DIAGNOSIS — K5900 Constipation, unspecified: Secondary | ICD-10-CM | POA: Diagnosis present

## 2017-10-08 DIAGNOSIS — N186 End stage renal disease: Secondary | ICD-10-CM | POA: Diagnosis present

## 2017-10-08 DIAGNOSIS — R112 Nausea with vomiting, unspecified: Secondary | ICD-10-CM | POA: Diagnosis not present

## 2017-10-08 DIAGNOSIS — E1065 Type 1 diabetes mellitus with hyperglycemia: Secondary | ICD-10-CM | POA: Diagnosis present

## 2017-10-08 DIAGNOSIS — K3184 Gastroparesis: Secondary | ICD-10-CM | POA: Diagnosis present

## 2017-10-08 DIAGNOSIS — E875 Hyperkalemia: Secondary | ICD-10-CM | POA: Diagnosis present

## 2017-10-08 DIAGNOSIS — I120 Hypertensive chronic kidney disease with stage 5 chronic kidney disease or end stage renal disease: Secondary | ICD-10-CM | POA: Diagnosis present

## 2017-10-08 DIAGNOSIS — E1143 Type 2 diabetes mellitus with diabetic autonomic (poly)neuropathy: Secondary | ICD-10-CM | POA: Diagnosis not present

## 2017-10-08 DIAGNOSIS — Z7901 Long term (current) use of anticoagulants: Secondary | ICD-10-CM

## 2017-10-08 DIAGNOSIS — Z79899 Other long term (current) drug therapy: Secondary | ICD-10-CM | POA: Diagnosis not present

## 2017-10-08 DIAGNOSIS — E8889 Other specified metabolic disorders: Secondary | ICD-10-CM | POA: Diagnosis present

## 2017-10-08 DIAGNOSIS — N3090 Cystitis, unspecified without hematuria: Secondary | ICD-10-CM | POA: Diagnosis present

## 2017-10-08 DIAGNOSIS — Z794 Long term (current) use of insulin: Secondary | ICD-10-CM

## 2017-10-08 DIAGNOSIS — N2581 Secondary hyperparathyroidism of renal origin: Secondary | ICD-10-CM | POA: Diagnosis present

## 2017-10-08 LAB — URINALYSIS, ROUTINE W REFLEX MICROSCOPIC
BILIRUBIN URINE: NEGATIVE
Glucose, UA: >=500 mg/dL — AB
Ketones, ur: NEGATIVE mg/dL
Nitrite: NEGATIVE
Protein, ur: >=300 mg/dL — AB
SPECIFIC GRAVITY, URINE: 1.009 (ref 1.005–1.030)
WBC, UA: >50 WBC/hpf — ABNORMAL HIGH (ref 0–5)
pH: 8 (ref 5.0–8.0)

## 2017-10-08 LAB — I-STAT BETA HCG BLOOD, ED (MC, WL, AP ONLY)

## 2017-10-08 LAB — COMPREHENSIVE METABOLIC PANEL
ALK PHOS: 62 U/L (ref 38–126)
ALT: 15 U/L (ref 14–54)
AST: 18 U/L (ref 15–41)
Albumin: 3.8 g/dL (ref 3.5–5.0)
Anion gap: 16 — ABNORMAL HIGH (ref 5–15)
BILIRUBIN TOTAL: 0.7 mg/dL (ref 0.3–1.2)
BUN: 55 mg/dL — AB (ref 6–20)
CALCIUM: 9.1 mg/dL (ref 8.9–10.3)
CO2: 27 mmol/L (ref 22–32)
CREATININE: 13.02 mg/dL — AB (ref 0.44–1.00)
Chloride: 89 mmol/L — ABNORMAL LOW (ref 101–111)
GFR, EST AFRICAN AMERICAN: 4 mL/min — AB (ref >60)
GFR, EST NON AFRICAN AMERICAN: 3 mL/min — AB (ref >60)
GLUCOSE: 132 mg/dL — AB (ref 65–99)
Potassium: 5.6 mmol/L — ABNORMAL HIGH (ref 3.5–5.1)
Sodium: 132 mmol/L — ABNORMAL LOW (ref 135–145)
TOTAL PROTEIN: 6.9 g/dL (ref 6.5–8.1)

## 2017-10-08 LAB — CBC
HCT: 31.9 % — ABNORMAL LOW (ref 36.0–46.0)
Hemoglobin: 10.3 g/dL — ABNORMAL LOW (ref 12.0–15.0)
MCH: 31.8 pg (ref 26.0–34.0)
MCHC: 32.3 g/dL (ref 30.0–36.0)
MCV: 98.5 fL (ref 78.0–100.0)
PLATELETS: 202 10*3/uL (ref 150–400)
RBC: 3.24 MIL/uL — AB (ref 3.87–5.11)
RDW: 15.4 % (ref 11.5–15.5)
WBC: 5.7 10*3/uL (ref 4.0–10.5)

## 2017-10-08 LAB — CBG MONITORING, ED: Glucose-Capillary: 149 mg/dL — ABNORMAL HIGH (ref 65–99)

## 2017-10-08 LAB — LIPASE, BLOOD: Lipase: 41 U/L (ref 11–51)

## 2017-10-08 MED ORDER — HYDROXYZINE HCL 25 MG PO TABS
25.0000 mg | ORAL_TABLET | Freq: Once | ORAL | Status: AC
  Administered 2017-10-08: 25 mg via ORAL
  Filled 2017-10-08: qty 1

## 2017-10-08 MED ORDER — SODIUM CHLORIDE 0.9 % IV SOLN
1.0000 g | Freq: Once | INTRAVENOUS | Status: AC
  Administered 2017-10-08: 1 g via INTRAVENOUS
  Filled 2017-10-08: qty 10

## 2017-10-08 MED ORDER — ONDANSETRON 4 MG PO TBDP
4.0000 mg | ORAL_TABLET | Freq: Once | ORAL | Status: AC
  Administered 2017-10-08: 4 mg via ORAL
  Filled 2017-10-08: qty 1

## 2017-10-08 MED ORDER — PROMETHAZINE HCL 25 MG/ML IJ SOLN
12.5000 mg | Freq: Once | INTRAMUSCULAR | Status: AC
  Administered 2017-10-08: 12.5 mg via INTRAVENOUS
  Filled 2017-10-08: qty 1

## 2017-10-08 MED ORDER — DIPHENHYDRAMINE HCL 50 MG/ML IJ SOLN
25.0000 mg | Freq: Once | INTRAMUSCULAR | Status: AC
  Administered 2017-10-08: 25 mg via INTRAVENOUS
  Filled 2017-10-08: qty 1

## 2017-10-08 MED ORDER — SODIUM POLYSTYRENE SULFONATE 15 GM/60ML PO SUSP
45.0000 g | Freq: Once | ORAL | Status: AC
  Administered 2017-10-08: 45 g via ORAL
  Filled 2017-10-08: qty 180

## 2017-10-08 MED ORDER — HALOPERIDOL LACTATE 5 MG/ML IJ SOLN
2.0000 mg | Freq: Once | INTRAMUSCULAR | Status: AC
  Administered 2017-10-08: 2 mg via INTRAVENOUS
  Filled 2017-10-08: qty 1

## 2017-10-08 NOTE — ED Triage Notes (Signed)
Pt states last dialysis Friday. Pt here for 3 days of diarrhea with nausea. pts states she started a new antidepressant and K binder last week and she thinks this is causing GI upset. No vomiting, no abdominal pain, no chest pain.

## 2017-10-08 NOTE — Progress Notes (Signed)
Called ER RN for report. Room ready.  

## 2017-10-08 NOTE — ED Provider Notes (Signed)
Patient signed out to me at shift change.  She is a dialysis patient.  She has missed her last 2 dialysis episodes.  She has missed them because of nausea and vomiting which is been persistent.  She has a history of gastroparesis.  She is uncertain what triggered this episode.  It is noted that her urinalysis is concerning for infection.  She is not tolerating oral fluids in the emergency department.  Will admit to medicine for persistent nausea and vomiting, treatment of UTI, and for consideration of dialysis in the morning.  Appreciate Dr. Hal Hope for admitting the patient.  Patient discussed with Dr. Dina Rich, who agrees with the plan.   Montine Circle, PA-C 10/08/17 2336    Merryl Hacker, MD 10/09/17 [phone]

## 2017-10-08 NOTE — ED Provider Notes (Signed)
[facility] EMERGENCY DEPARTMENT Provider Note   CSN: [id number] Arrival date & time: 10/08/17  1509     History   Chief Complaint Chief Complaint  Patient presents with  . Nausea  . Diarrhea    HPI Carrie [name] is a 37 y.o. female with a hx of ESRD on dialysis (MWF), T1DM, HTN, hyperlipidemia, and gastroparesis who presents to the ED for nausea x 1 week. Patient states that she has felt nauseous similar to previous problems with gastroparesis. She has not had any vomiting. She has been trying to eat foods without strong smells with some improvement. No other alleviating/aggravating factors. She does mention diarrhea for 3 days last week (Tuesday-Thursday) which has completely resolved. No blood in stool. No recent foreign travel or abx. Denies abdominal pain, fever, chills, dysuria, hematuria, or urinary frequency. She is a dialysis patient (MWF) last dialyzed Friday, did not receive dialysis today.  Denies chest pain, dyspnea, numbness, or focal weakness.   HPI  Past Medical History:  Diagnosis Date  . Anemia   . Diabetic neuropathy ([place])   . Diabetic retinopathy ([place])   . ESRD (end stage renal disease) on dialysis [facility])    "MWF; [place]" (02/02/2017)  . Gastroparesis   . HA (headache)    "qd if I didn't take my RX" (02/02/2017)  . Hemodialysis access site with arteriovenous graft ([place])   . Hypertension   . Neuropathy   . Renal insufficiency   . Trigeminal neuralgia   . Type I diabetes mellitus [facility])     Patient Active Problem List   Diagnosis Date Noted  . Chest pain 05/04/2017  . ESRD (end stage renal disease) ([place]) 02/01/2017  . 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 Pregnancy test positive 12/22/2016  . Amenorrhea 12/12/2016  . Diabetes ([place]) 09/16/2016  . ESRD on hemodialysis ([place])   . Gastroparesis   . Intractable vomiting 09/04/2016  . CKD stage 5 due to type 1 diabetes mellitus ([place]) 09/04/2016  . Intractable vomiting with nausea 04/04/2015  .  AKI (acute kidney injury) ([place]) 04/04/2015  . DKA (diabetic ketoacidoses) ([place]) 04/03/2015  . Trigeminal neuralgia 12/12/2013  . HA (headache)   . DKA, type 2 ([place]) 10/24/2012  . Contact dermatitis 10/24/2012  . SORE THROAT 03/22/2010  . 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 FATIGUE 05/25/2009  . VAGINAL DISCHARGE 01/28/2009  . Carbuncle and furuncle of unspecified site 12/29/2008  . HYPERCHOLESTEROLEMIA 06/12/2008  . MICROALBUMINURIA 06/12/2008  . MONILIAL VAGINITIS 11/14/2007  . INGUINAL PAIN, BILATERAL 11/14/2007  . DYSURIA 10/17/2007  . CYSTITIS 09/05/2007  . DELAYED MENSES 09/05/2007  . RHINOCONJUNCTIVITIS, ALLERGIC 07/19/2007  . DENTAL PAIN 03/28/2007  . HYPERLIPIDEMIA 09/01/2006  . PELVIC INFLAMMATORY DISEASE 09/01/2006  . ABORTION, SPONTANEOUS 09/01/2006  . ABSCESS 09/01/2006  . HX, PERSONAL, PAST NONCOMPLIANCE 09/01/2006    Past Surgical History:  Procedure Laterality Date  . AV FISTULA PLACEMENT Left 09/07/2016   Procedure: Left arm 1st Stage Basilic AV Fistula;  Surgeon: [name], MD;  Location: [facility] OR;  Service: Vascular;  Laterality: Left;  . DILATION AND CURETTAGE OF UTERUS    . EXCISIONAL HEMORRHOIDECTOMY    . EYE SURGERY    . INSERTION OF DIALYSIS CATHETER Right 09/07/2016   Procedure: INSERTION OF DIALYSIS CATHETER;  Surgeon: [name], MD;  Location: [facility] OR;  Service: Vascular;  Laterality: Right;  . RETINAL LASER PROCEDURE Bilateral    "related to diabetes"  . REVISON OF ARTERIOVENOUS FISTULA Left 01/15/2017   Procedure:  INSERTION OF  AV GORE-TEX GRAFT;  Surgeon: Elam Dutch, MD;  Location: [facility];  Service: Vascular;  Laterality: Left;  . TEE WITHOUT CARDIOVERSION N/A 01/11/2017   Procedure: TRANSESOPHAGEAL ECHOCARDIOGRAM (TEE);  Surgeon: Jerline Pain, MD;  Location: [facility] ENDOSCOPY;  Service: Cardiovascular;  Laterality: N/A;     OB History    Gravida  7   Para  1   Term      Preterm  1   AB  5   Living  1     SAB  2   TAB  3    Ectopic      Multiple      Live Births               Home Medications    Prior to Admission medications   Medication Sig Start Date End Date Taking? Authorizing Provider  amLODipine (NORVASC) 10 MG tablet Take 10 mg by mouth at bedtime. 10/01/17   [provider]  carbamazepine (TEGRETOL) 200 MG tablet Take 1 tablet (200 mg total) by mouth at bedtime. 05/07/17   Annita Brod, MD  diclofenac sodium (VOLTAREN) 1 % GEL Apply 2 g topically 4 (four) times daily. Patient not taking: Reported on 08/21/2017 01/24/17   Melanee Spry, MD  diphenhydramine-acetaminophen (TYLENOL PM) 25-500 MG TABS tablet Take 2 tablets by mouth at bedtime as needed.     [provider]  escitalopram (LEXAPRO) 10 MG tablet Take 10 mg by mouth daily. 09/22/17   [provider]  gabapentin (NEURONTIN) 300 MG capsule Take 1 capsule (300 mg total) by mouth at bedtime. Patient taking differently: Take 900 mg by mouth at bedtime.  09/12/16   Rosita Fire, MD  HUMULIN N KWIKPEN 100 UNIT/ML Mayer Masker Inject 25 Units into the skin every morning. 09/08/17   [provider]  insulin aspart protamine - aspart (NOVOLOG MIX 70/30 FLEXPEN) (70-30) 100 UNIT/ML FlexPen Inject 0.45 mLs (45 Units total) into the skin daily with breakfast. And pen needles 1/day 08/21/17   Renato Shin, MD  labetalol (NORMODYNE) 300 MG tablet Take 300 mg by mouth See admin instructions. Take 300mg  tablet at bedtime on diaylsis days M, W, F. Take 300mg  tablet twice daily on all other days 08/25/16   [provider]  losartan (COZAAR) 50 MG tablet Take 50 mg by mouth daily.    [provider]  LYRICA 75 MG capsule Take 75 mg by mouth at bedtime. 10/01/17   [provider]  metoCLOPramide (REGLAN) 10 MG tablet Take 10 mg by mouth 3 (three) times daily with meals.     [provider]  ondansetron (ZOFRAN) 4 MG tablet Take 1 tablet (4 mg total) by mouth every 8 (eight) hours as  needed for nausea or vomiting. 09/12/16   Rosita Fire, MD  promethazine (PHENERGAN) 25 MG tablet Take 1 tablet (25 mg total) by mouth every 8 (eight) hours as needed for nausea or vomiting. 09/12/16   Rosita Fire, MD  rosuvastatin (CRESTOR) 10 MG tablet Take 10 mg by mouth at bedtime. 03/30/17   [provider]  sevelamer carbonate (RENVELA) 800 MG tablet Take 800 mg by mouth 3 (three) times daily with meals.    [provider]  warfarin (COUMADIN) 5 MG tablet Take 5 mg by mouth See admin instructions. Per pt. She takes 7.5mg  on Tuesday and Thursday, and 10mg  on Mondays, Wednesday, Friday, Saturday, and Sunday.    [provider]  Family History Family History  Problem Relation Age of Onset  . Hyperlipidemia Mother   . Asthma Daughter   . Diabetes Maternal Grandmother   . Stroke Maternal Grandmother   . Kidney disease Maternal Grandfather   . Kidney disease Paternal Grandmother     Social History Social History   Tobacco Use  . Smoking status: Never Smoker  . Smokeless tobacco: Never Used  Substance Use Topics  . Alcohol use: Yes    Alcohol/week: 2.4 oz    Types: 4 Standard drinks or equivalent per week    Comment: 02/02/2017 "nothing in the last 6 months; never had problem w/it"  . Drug use: No     Allergies   No known allergies   Review of Systems Review of Systems  Constitutional: Negative for chills and fever.  Respiratory: Negative for shortness of breath.   Cardiovascular: Negative for chest pain, palpitations and leg swelling.  Gastrointestinal: Positive for diarrhea (resolved) and nausea. Negative for abdominal pain, blood in stool, constipation and vomiting.  Genitourinary: Negative for dysuria, frequency, urgency, vaginal bleeding and vaginal discharge.  Neurological: Negative for weakness and numbness.  All other systems reviewed and are negative.  Physical Exam Updated Vital Signs BP (!) 170/103   Pulse 85    Temp 99.2 F (37.3 C)   Resp 19   LMP 07/17/2016 (Within Weeks) Comment: Has not had a period since starting dialysis in May  SpO2 100%   Physical Exam  Constitutional: She appears well-developed and well-nourished.  Non-toxic appearance. No distress.  HENT:  Head: Normocephalic and atraumatic.  Eyes: Conjunctivae are normal. Right eye exhibits no discharge. Left eye exhibits no discharge.  Cardiovascular: Normal rate and regular rhythm.  No murmur heard. Pulmonary/Chest: Effort normal and breath sounds normal. No respiratory distress. She has no wheezes. She has no rales.  Abdominal: Soft. She exhibits no distension. There is no tenderness. There is no rebound and no guarding.  Musculoskeletal: She exhibits no edema.  LUE fistula with palpable thrill.   Neurological: She is alert.  Clear speech.   Skin: Skin is warm and dry. No rash noted.  Psychiatric: She has a normal mood and affect. Her behavior is normal.  Nursing note and vitals reviewed.   ED Treatments / Results  Labs Results for orders placed or performed during the hospital encounter of 10/08/17  Lipase, blood  Result Value Ref Range   Lipase 41 11 - 51 U/L  Comprehensive metabolic panel  Result Value Ref Range   Sodium 132 (L) 135 - 145 mmol/L   Potassium 5.6 (H) 3.5 - 5.1 mmol/L   Chloride 89 (L) 101 - 111 mmol/L   CO2 27 22 - 32 mmol/L   Glucose, Bld 132 (H) 65 - 99 mg/dL   BUN 55 (H) 6 - 20 mg/dL   Creatinine, Ser 13.02 (H) 0.44 - 1.00 mg/dL   Calcium 9.1 8.9 - 10.3 mg/dL   Total Protein 6.9 6.5 - 8.1 g/dL   Albumin 3.8 3.5 - 5.0 g/dL   AST 18 15 - 41 U/L   ALT 15 14 - 54 U/L   Alkaline Phosphatase 62 38 - 126 U/L   Total Bilirubin 0.7 0.3 - 1.2 mg/dL   GFR calc non Af Amer 3 (L) >60 mL/min   GFR calc Af Amer 4 (L) >60 mL/min   Anion gap 16 (H) 5 - 15  CBC  Result Value Ref Range   WBC 5.7 4.0 - 10.5 K/uL   RBC  3.24 (L) 3.87 - 5.11 MIL/uL   Hemoglobin 10.3 (L) 12.0 - 15.0 g/dL   HCT 31.9 (L) 36.0 -  46.0 %   MCV 98.5 78.0 - 100.0 fL   MCH 31.8 26.0 - 34.0 pg   MCHC 32.3 30.0 - 36.0 g/dL   RDW 15.4 11.5 - 15.5 %   Platelets 202 150 - 400 K/uL  Urinalysis, Routine w reflex microscopic  Result Value Ref Range   Color, Urine YELLOW YELLOW   APPearance HAZY (A) CLEAR   Specific Gravity, Urine 1.009 1.005 - 1.030   pH 8.0 5.0 - 8.0   Glucose, UA >=500 (A) NEGATIVE mg/dL   Hgb urine dipstick SMALL (A) NEGATIVE   Bilirubin Urine NEGATIVE NEGATIVE   Ketones, ur NEGATIVE NEGATIVE mg/dL   Protein, ur >=300 (A) NEGATIVE mg/dL   Nitrite NEGATIVE NEGATIVE   Leukocytes, UA LARGE (A) NEGATIVE   RBC / HPF 11-20 0 - 5 RBC/hpf   WBC, UA >50 (H) 0 - 5 WBC/hpf   Bacteria, UA FEW (A) NONE SEEN   Squamous Epithelial / LPF 6-10 0 - 5   WBC Clumps PRESENT    Non Squamous Epithelial 0-5 (A) NONE SEEN  I-Stat beta hCG blood, ED  Result Value Ref Range   I-stat hCG, quantitative <5.0 <5 mIU/mL   Comment 3           No results found. EKG EKG Interpretation  Date/Time:  Monday October 08 2017 19:21:24 EDT Ventricular Rate:  83 PR Interval:    QRS Duration: 91 QT Interval:  362 QTC Calculation: 426 R Axis:   38 Text Interpretation:  Sinus rhythm Confirmed by Virgel Manifold [phone]) on 10/08/2017 10:25:27 PM   Radiology No results found.  Procedures Procedures (including critical care time)  Medications Ordered in ED Medications  ondansetron (ZOFRAN-ODT) disintegrating tablet 4 mg (4 mg Oral Given 10/08/17 1841)  sodium polystyrene (KAYEXALATE) 15 GM/60ML suspension 45 g (45 g Oral Given 10/08/17 1940)  promethazine (PHENERGAN) injection 12.5 mg (12.5 mg Intravenous Given 10/08/17 2009)  promethazine (PHENERGAN) injection 12.5 mg (12.5 mg Intravenous Given 10/08/17 2144)  diphenhydrAMINE (BENADRYL) injection 25 mg (25 mg Intravenous Given 10/08/17 2144)  haloperidol lactate (HALDOL) injection 2 mg (2 mg Intravenous Given 10/08/17 2236)   Initial Impression / Assessment and Plan / ED Course  I  have reviewed the triage vital signs and the nursing notes.  Pertinent labs & imaging results that were available during my care of the patient were reviewed by me and considered in my medical decision making (see chart for details).   Patient presents with nausea that she reports is consistent with her prior problems with gastroparesis. Patient nontoxic appearing, in no apparent distress, resting comfortably, vitals notable for hypertension, doubt HTN emergency- appears consistent and somewhat improved from prior ER visit blood pressures, patient aware of need for recheck and PCP follow up. Patient has a benign physical exam, no abdominal tenderness or peritoneal signs to raise concern for  cholecystitis, pancreatitis, appendicitis, bowel obstruction/perforation, ectopic pregnancy, or PID. Labs per triage reviewed: Creatinine/BUN elevated at 13.02 and 55 respectively, this is increased from prior baseline labs. Patient is hyperkalemic at 5.6, hyponatremic at 132, hypochloremic at 89. Hgb 10.3 is fairly consistent with previous labs. UA with glucosuria, hgb, proteinuria, and leukocytes- patient denies urinary sxs. Patient labs appear consistent with missed dialysis session today. She is resting comfortably, no evidence of respiratory distress on my exam, she does not appear fluid overloaded at this time.  Discussed with supervising physician Dr. Wilson Singer- plan for EKG, urine culture, Kayexalate and likely discharge home with plans for dialysis tomorrow.   EKG with NSR, patient with persistent nausea, 1 episode of emesis, will give additional phenergan and benadryl.   22:25: RE-EVAL: Patient has not had any vomiting since phenergan/benadryl, however reports her nausea is unchanged. Will give Haldol, Dr. Wilson Singer in agreement. Patient signed out to [employer] PA-C at change of shift pending re-evaluation.   Final Clinical Impressions(s) / ED Diagnoses   Final diagnoses:  None    ED Discharge Orders    None        Leafy Kindle 10/08/17 2244    Virgel Manifold, MD 10/10/17 1242

## 2017-10-08 NOTE — ED Notes (Signed)
PA at the bedside.

## 2017-10-08 NOTE — ED Notes (Signed)
Pt states she had diarrhea for 3 days last week, currently reports no episodes of diarrhea.

## 2017-10-08 NOTE — ED Notes (Signed)
Patient transported to X-ray 

## 2017-10-08 NOTE — ED Provider Notes (Signed)
Patient placed in Quick Look pathway, seen and evaluated   Chief Complaint: nausea and vomiting  HPI:   Carrie [name] is a 37 y.o. female dialysis patient with last dialysis 3 days ago who presents to the ED with n/d after starting on new medication 3 days ago. Patient states that the new medication is an antidepressant and she also started a K binder. Patient thinks the medications are causing her symptoms.   ROS: GI; N/d,  Physical Exam:  BP (!) 170/103   Pulse 85   Temp 99.2 F (37.3 C)   Resp 19   LMP 07/17/2016 (Within Weeks) Comment: Has not had a period since starting dialysis in May  SpO2 100%    Gen: No distress  Neuro: Awake and Alert  Skin: Warm and dry  Abdomen: soft non tender with palpation      Initiation of care has begun. The patient has been counseled on the process, plan, and necessity for staying for the completion/evaluation, and the remainder of the medical screening examination     Ashley Murrain, NP 10/08/17 1542    Julianne Rice, MD 10/09/17 [phone]

## 2017-10-09 ENCOUNTER — Other Ambulatory Visit: Payer: Self-pay

## 2017-10-09 ENCOUNTER — Encounter (HOSPITAL_COMMUNITY): Payer: Self-pay | Admitting: Internal Medicine

## 2017-10-09 DIAGNOSIS — N3000 Acute cystitis without hematuria: Secondary | ICD-10-CM

## 2017-10-09 DIAGNOSIS — R112 Nausea with vomiting, unspecified: Secondary | ICD-10-CM

## 2017-10-09 DIAGNOSIS — I160 Hypertensive urgency: Secondary | ICD-10-CM | POA: Diagnosis present

## 2017-10-09 LAB — HEPATIC FUNCTION PANEL
ALK PHOS: 63 U/L (ref 38–126)
ALT: 17 U/L (ref 0–44)
AST: 20 U/L (ref 15–41)
Albumin: 3.6 g/dL (ref 3.5–5.0)
Bilirubin, Direct: <0.1 mg/dL (ref 0.0–0.2)
TOTAL PROTEIN: 6.9 g/dL (ref 6.5–8.1)
Total Bilirubin: 0.8 mg/dL (ref 0.3–1.2)

## 2017-10-09 LAB — GLUCOSE, CAPILLARY
GLUCOSE-CAPILLARY: 129 mg/dL — AB (ref 70–99)
Glucose-Capillary: 164 mg/dL — ABNORMAL HIGH (ref 70–99)
Glucose-Capillary: 174 mg/dL — ABNORMAL HIGH (ref 70–99)

## 2017-10-09 LAB — CBC WITH DIFFERENTIAL/PLATELET
Abs Immature Granulocytes: 0 10*3/uL (ref 0.0–0.1)
BASOS ABS: 0.1 10*3/uL (ref 0.0–0.1)
Basophils Relative: 1 %
EOS ABS: 0.1 10*3/uL (ref 0.0–0.7)
EOS PCT: 1 %
HCT: 33.1 % — ABNORMAL LOW (ref 36.0–46.0)
Hemoglobin: 10.7 g/dL — ABNORMAL LOW (ref 12.0–15.0)
Immature Granulocytes: 0 %
LYMPHS PCT: 24 %
Lymphs Abs: 1.8 10*3/uL (ref 0.7–4.0)
MCH: 31.4 pg (ref 26.0–34.0)
MCHC: 32.3 g/dL (ref 30.0–36.0)
MCV: 97.1 fL (ref 78.0–100.0)
Monocytes Absolute: 0.4 10*3/uL (ref 0.1–1.0)
Monocytes Relative: 5 %
NEUTROS PCT: 69 %
Neutro Abs: 5.2 10*3/uL (ref 1.7–7.7)
Platelets: 205 10*3/uL (ref 150–400)
RBC: 3.41 MIL/uL — AB (ref 3.87–5.11)
RDW: 15.6 % — AB (ref 11.5–15.5)
WBC: 7.7 10*3/uL (ref 4.0–10.5)

## 2017-10-09 LAB — BASIC METABOLIC PANEL
Anion gap: 17 — ABNORMAL HIGH (ref 5–15)
BUN: 54 mg/dL — ABNORMAL HIGH (ref 6–20)
CO2: 24 mmol/L (ref 22–32)
CREATININE: 13.3 mg/dL — AB (ref 0.44–1.00)
Calcium: 8.8 mg/dL — ABNORMAL LOW (ref 8.9–10.3)
Chloride: 89 mmol/L — ABNORMAL LOW (ref 98–111)
GFR, EST AFRICAN AMERICAN: 4 mL/min — AB (ref >60)
GFR, EST NON AFRICAN AMERICAN: 3 mL/min — AB (ref >60)
Glucose, Bld: 145 mg/dL — ABNORMAL HIGH (ref 70–99)
POTASSIUM: 4.9 mmol/L (ref 3.5–5.1)
SODIUM: 130 mmol/L — AB (ref 135–145)

## 2017-10-09 LAB — HIV ANTIBODY (ROUTINE TESTING W REFLEX): HIV Screen 4th Generation wRfx: NONREACTIVE

## 2017-10-09 LAB — CARBAMAZEPINE LEVEL, TOTAL

## 2017-10-09 MED ORDER — INSULIN ASPART 100 UNIT/ML ~~LOC~~ SOLN
0.0000 [IU] | Freq: Three times a day (TID) | SUBCUTANEOUS | Status: DC
Start: 2017-10-09 — End: 2017-10-10
  Administered 2017-10-09: 2 [IU] via SUBCUTANEOUS
  Administered 2017-10-10: 1 [IU] via SUBCUTANEOUS

## 2017-10-09 MED ORDER — LIDOCAINE HCL (PF) 1 % IJ SOLN
INTRAMUSCULAR | Status: AC
Start: 2017-10-09 — End: 2017-10-09
  Administered 2017-10-09: 16:00:00
  Filled 2017-10-09: qty 2

## 2017-10-09 MED ORDER — PROMETHAZINE HCL 25 MG PO TABS
25.0000 mg | ORAL_TABLET | Freq: Three times a day (TID) | ORAL | Status: DC | PRN
  Administered 2017-10-09 – 2017-10-10 (×4): 25 mg via ORAL
  Filled 2017-10-09 (×4): qty 1

## 2017-10-09 MED ORDER — ACETAMINOPHEN 325 MG PO TABS: 650.0000 mg | ORAL_TABLET | Freq: Four times a day (QID) | ORAL | Status: DC | PRN

## 2017-10-09 MED ORDER — GABAPENTIN 300 MG PO CAPS
300.0000 mg | ORAL_CAPSULE | Freq: Every day | ORAL | Status: DC
  Administered 2017-10-09: 300 mg via ORAL
  Filled 2017-10-09: qty 1

## 2017-10-09 MED ORDER — LABETALOL HCL 300 MG PO TABS
300.0000 mg | ORAL_TABLET | Freq: Every day | ORAL | Status: DC
  Administered 2017-10-09: 300 mg via ORAL
  Filled 2017-10-09: qty 1

## 2017-10-09 MED ORDER — INSULIN ASPART PROT & ASPART (70-30 MIX) 100 UNIT/ML ~~LOC~~ SUSP
25.0000 [IU] | SUBCUTANEOUS | Status: DC
  Administered 2017-10-09: 25 [IU] via SUBCUTANEOUS
  Filled 2017-10-09: qty 10

## 2017-10-09 MED ORDER — SODIUM CHLORIDE 0.9 % IV SOLN
1.0000 g | INTRAVENOUS | Status: DC
  Administered 2017-10-09: 1 g via INTRAVENOUS
  Filled 2017-10-09 (×2): qty 10

## 2017-10-09 MED ORDER — ONDANSETRON HCL 4 MG PO TABS
4.0000 mg | ORAL_TABLET | Freq: Four times a day (QID) | ORAL | Status: DC | PRN
  Filled 2017-10-09: qty 1

## 2017-10-09 MED ORDER — INSULIN ASPART PROT & ASPART (70-30 MIX) 100 UNIT/ML ~~LOC~~ SUSP
15.0000 [IU] | SUBCUTANEOUS | Status: DC
  Administered 2017-10-10: 15 [IU] via SUBCUTANEOUS
  Filled 2017-10-09: qty 10

## 2017-10-09 MED ORDER — HEPARIN SODIUM (PORCINE) 5000 UNIT/ML IJ SOLN
5000.0000 [IU] | Freq: Three times a day (TID) | INTRAMUSCULAR | Status: DC
  Administered 2017-10-09 – 2017-10-10 (×2): 5000 [IU] via SUBCUTANEOUS
  Filled 2017-10-09 (×4): qty 1

## 2017-10-09 MED ORDER — LABETALOL HCL 300 MG PO TABS
300.0000 mg | ORAL_TABLET | ORAL | Status: DC
  Administered 2017-10-09: 300 mg via ORAL
  Filled 2017-10-09: qty 1

## 2017-10-09 MED ORDER — METOCLOPRAMIDE HCL 10 MG PO TABS
10.0000 mg | ORAL_TABLET | Freq: Three times a day (TID) | ORAL | Status: DC
  Administered 2017-10-09 – 2017-10-10 (×5): 10 mg via ORAL
  Filled 2017-10-09 (×5): qty 1

## 2017-10-09 MED ORDER — ACETAMINOPHEN 650 MG RE SUPP: 650.0000 mg | Freq: Four times a day (QID) | RECTAL | Status: DC | PRN

## 2017-10-09 MED ORDER — PREGABALIN 75 MG PO CAPS
75.0000 mg | ORAL_CAPSULE | Freq: Every day | ORAL | Status: DC
  Administered 2017-10-09: 75 mg via ORAL
  Filled 2017-10-09: qty 1

## 2017-10-09 MED ORDER — CHLORHEXIDINE GLUCONATE CLOTH 2 % EX PADS: 6.0000 | MEDICATED_PAD | Freq: Every day | CUTANEOUS | Status: DC

## 2017-10-09 MED ORDER — FERRIC CITRATE 1 GM 210 MG(FE) PO TABS
210.0000 mg | ORAL_TABLET | Freq: Three times a day (TID) | ORAL | Status: DC
  Administered 2017-10-09 – 2017-10-10 (×3): 210 mg via ORAL
  Filled 2017-10-09 (×6): qty 1

## 2017-10-09 MED ORDER — ESCITALOPRAM OXALATE 10 MG PO TABS
10.0000 mg | ORAL_TABLET | Freq: Every day | ORAL | Status: DC
  Administered 2017-10-09 – 2017-10-10 (×2): 10 mg via ORAL
  Filled 2017-10-09 (×3): qty 1

## 2017-10-09 MED ORDER — ONDANSETRON HCL 4 MG/2ML IJ SOLN
4.0000 mg | Freq: Four times a day (QID) | INTRAMUSCULAR | Status: DC | PRN
  Administered 2017-10-09: 4 mg via INTRAVENOUS
  Filled 2017-10-09: qty 2

## 2017-10-09 MED ORDER — IBUPROFEN 400 MG PO TABS
400.0000 mg | ORAL_TABLET | Freq: Four times a day (QID) | ORAL | Status: DC | PRN
  Filled 2017-10-09: qty 1

## 2017-10-09 MED ORDER — CARBAMAZEPINE 200 MG PO TABS
200.0000 mg | ORAL_TABLET | Freq: Every day | ORAL | Status: DC
  Administered 2017-10-09: 200 mg via ORAL
  Filled 2017-10-09: qty 1

## 2017-10-09 MED ORDER — ROSUVASTATIN CALCIUM 10 MG PO TABS
10.0000 mg | ORAL_TABLET | Freq: Every day | ORAL | Status: DC
  Administered 2017-10-09: 10 mg via ORAL
  Filled 2017-10-09: qty 1

## 2017-10-09 MED ORDER — AMLODIPINE BESYLATE 10 MG PO TABS
10.0000 mg | ORAL_TABLET | Freq: Every day | ORAL | Status: DC
  Administered 2017-10-09: 10 mg via ORAL
  Filled 2017-10-09: qty 1

## 2017-10-09 MED ORDER — LABETALOL HCL 5 MG/ML IV SOLN
10.0000 mg | INTRAVENOUS | Status: DC | PRN
  Administered 2017-10-09: 10 mg via INTRAVENOUS
  Filled 2017-10-09: qty 4

## 2017-10-09 NOTE — Consult Note (Addendum)
[facility] Renal Consultation Note    Indication for Consultation:  Management of ESRD/hemodialysis, anemia, hypertension/volume, and secondary hyperparathyroidism. PCP:  HPI: Carrie [name] is a 37 y.o. female with ESRD, Type 1 DM, gastroparesis, HTN who was admitted with nausea/vomiting, inability to keep down food/meds.   Pt reports daily nausea, but worse with vomiting and diarrhea x 3 days. Was unable to keep down her food or meds which prompted ED evaluation. In ED, labs showed Na 132, K 5.6, WBC 5.7, Hgb 10.3. Abd xray showed constipation. CXR with trace R effusion, mild interstitial edema. She was given soap suds enema with improvement. No CP, dyspnea, fever, chills, headache, abd pain.  From renal standpoint, she dialyzes MWF at [facility]. Her last HD was Firday 6/21 - she missed HD yesterday due to the above. No recent HD issues.  Past Medical History:  Diagnosis Date  . Anemia   . Diabetic neuropathy ([place])   . Diabetic retinopathy ([place])   . ESRD (end stage renal disease) on dialysis [facility])    [place]WF; [place]" (02/02/2017)  . Gastroparesis   . HA (headache)    "qd if I didn't take my RX" (02/02/2017)  . Hemodialysis access site with arteriovenous graft ([place])   . Hypertension   . Neuropathy   . Renal insufficiency   . Trigeminal neuralgia   . Type I diabetes mellitus ([place])    Past Surgical History:  Procedure Laterality Date  . AV FISTULA PLACEMENT Left 09/07/2016   Procedure: Left arm 1st Stage Basilic AV Fistula;  Surgeon: [name], MD;  Location: [facility] OR;  Service: Vascular;  Laterality: Left;  . DILATION AND CURETTAGE OF UTERUS    . EXCISIONAL HEMORRHOIDECTOMY    . EYE SURGERY    . INSERTION OF DIALYSIS CATHETER Right 09/07/2016   Procedure: INSERTION OF DIALYSIS CATHETER;  Surgeon: [name], MD;  Location: [facility] OR;  Service: Vascular;  Laterality: Right;  . RETINAL LASER PROCEDURE Bilateral    "related to diabetes"  . REVISON OF  ARTERIOVENOUS FISTULA Left 01/15/2017   Procedure: INSERTION OF  AV GORE-TEX GRAFT;  Surgeon: Elam Dutch, MD;  Location: [place];  Service: Vascular;  Laterality: Left;  . TEE WITHOUT CARDIOVERSION N/A 01/11/2017   Procedure: TRANSESOPHAGEAL ECHOCARDIOGRAM (TEE);  Surgeon: Jerline Pain, MD;  Location: [facility] ENDOSCOPY;  Service: Cardiovascular;  Laterality: N/A;   Family History  Problem Relation Age of Onset  . Hyperlipidemia Mother   . Asthma Daughter   . Diabetes Maternal Grandmother   . Stroke Maternal Grandmother   . Kidney disease Maternal Grandfather   . Kidney disease Paternal Grandmother    Social History:  reports that she has never smoked. She has never used smokeless tobacco. She reports that she drinks about 2.4 oz of alcohol per week. She reports that she does not use drugs.  ROS: As per HPI otherwise negative.  Physical Exam: Vitals:   10/08/17 2330 10/09/17 0036 10/09/17 0530 10/09/17 0700  BP: (!) 151/94 (!) 193/105 (!) 206/117 (!) 179/103  Pulse: 89 88 86 86  Resp:  16 16   Temp:  99.2 F (37.3 C) 98.9 F (37.2 C)   TempSrc:  Oral Oral   SpO2: 94% 100% 97% 97%  Weight:  97.2 kg (214 lb 4.8 oz)    Height:  5\' 8"  (1.727 m)       General: Well developed, well nourished, in no acute distress. Head: Normocephalic, atraumatic, sclera non-icteric, mucus membranes  are moist. Neck: Supple without lymphadenopathy/masses. JVD not elevated. Lungs: Clear bilaterally to auscultation without wheezes, rales, or rhonchi.  Heart: RRR with normal S1, S2. No murmurs, rubs, or gallops appreciated. Abdomen: Soft, non-tender, non-distended with normoactive bowel sounds. No rebound/guarding. No obvious abdominal masses. Musculoskeletal:  Strength and tone appear normal for age. Lower extremities: No edema or ischemic changes, no open wounds. Neuro: Alert and oriented X 3. Moves all extremities spontaneously. Psych:  Responds to questions appropriately with a normal  affect. Dialysis Access: AVG + thrill  No Known Allergies Prior to Admission medications   Medication Sig Start Date End Date Taking? Authorizing Provider  acetaminophen (TYLENOL) 500 MG tablet Take 1,000 mg by mouth every Monday, Wednesday, and Friday with hemodialysis.   Yes [provider]  amLODipine (NORVASC) 10 MG tablet Take 10 mg by mouth at bedtime. 10/01/17  Yes [provider]  carbamazepine (TEGRETOL) 200 MG tablet Take 1 tablet (200 mg total) by mouth at bedtime. 05/07/17  Yes Annita Brod, MD  diphenhydrAMINE (BENADRYL) 25 mg capsule Take 50 mg by mouth every Monday, Wednesday, and Friday with hemodialysis.   Yes [provider]  escitalopram (LEXAPRO) 10 MG tablet Take 10 mg by mouth daily. 09/22/17  Yes [provider]  ferric citrate (AURYXIA) 1 GM 210 MG(Fe) tablet Take 210 mg by mouth 3 (three) times daily with meals.   Yes [provider]  gabapentin (NEURONTIN) 300 MG capsule Take 1 capsule (300 mg total) by mouth at bedtime. Patient taking differently: Take 900 mg by mouth at bedtime.  09/12/16  Yes Rosita Fire, MD  insulin aspart protamine - aspart (NOVOLOG MIX 70/30 FLEXPEN) (70-30) 100 UNIT/ML FlexPen Inject 0.45 mLs (45 Units total) into the skin daily with breakfast. And pen needles 1/day Patient taking differently: Inject 15-25 Units into the skin See admin instructions. On non-dialysis days inj 25 units in the morning and 15 units at night. On dialysis days inj 15 units in the morning and 15 units at night. 08/21/17  Yes Renato Shin, MD  labetalol (NORMODYNE) 300 MG tablet Take 300 mg by mouth See admin instructions. Take 300mg  tablet at bedtime on diaylsis days M, W, F. Take 300mg  tablet twice daily on all other days 08/25/16  Yes [provider]  LYRICA 75 MG capsule Take 75 mg by mouth at bedtime. 10/01/17  Yes [provider]  metoCLOPramide (REGLAN) 10 MG tablet Take 10 mg by mouth 3 (three)  times daily with meals.    Yes [provider]  ondansetron (ZOFRAN) 4 MG tablet Take 1 tablet (4 mg total) by mouth every 8 (eight) hours as needed for nausea or vomiting. 09/12/16  Yes Rosita Fire, MD  promethazine (PHENERGAN) 25 MG tablet Take 1 tablet (25 mg total) by mouth every 8 (eight) hours as needed for nausea or vomiting. 09/12/16  Yes Rosita Fire, MD  rosuvastatin (CRESTOR) 10 MG tablet Take 10 mg by mouth at bedtime. 03/30/17  Yes [provider]  diclofenac sodium (VOLTAREN) 1 % GEL Apply 2 g topically 4 (four) times daily. Patient not taking: Reported on 08/21/2017 01/24/17   Melanee Spry, MD   Current Facility-Administered Medications  Medication Dose Route Frequency Provider Last Rate Last Dose  . acetaminophen (TYLENOL) tablet 650 mg  650 mg Oral Q6H PRN Rise Patience, MD       Or  . acetaminophen (TYLENOL) suppository 650 mg  650 mg Rectal Q6H PRN Gean Birchwood  N, MD      . amLODipine (NORVASC) tablet 10 mg  10 mg Oral QHS Rise Patience, MD      . carbamazepine (TEGRETOL) tablet 200 mg  200 mg Oral QHS Rise Patience, MD      . cefTRIAXone (ROCEPHIN) 1 g in sodium chloride 0.9 % 100 mL IVPB  1 g Intravenous Q24H Rise Patience, MD      . escitalopram (LEXAPRO) tablet 10 mg  10 mg Oral Daily Rise Patience, MD   10 mg at 10/09/17 8850  . ferric citrate (AURYXIA) tablet 210 mg  210 mg Oral TID WC Rise Patience, MD   210 mg at 10/09/17 0824  . gabapentin (NEURONTIN) capsule 300 mg  300 mg Oral QHS Rise Patience, MD      . heparin injection 5,000 Units  5,000 Units Subcutaneous Q8H Rise Patience, MD      . insulin aspart (novoLOG) injection 0-9 Units  0-9 Units Subcutaneous TID WC Rise Patience, MD   2 Units at 10/09/17 0825  . insulin aspart protamine- aspart (NOVOLOG MIX 70/30) injection 15 Units  15 Units Subcutaneous Irregular times every day Rise Patience, MD       . insulin aspart protamine- aspart (NOVOLOG MIX 70/30) injection 25 Units  25 Units Subcutaneous Once per day on Sun Tue Thu Sat Rise Patience, MD   25 Units at 10/09/17 [phone]  . labetalol (NORMODYNE) tablet 300 mg  300 mg Oral Once per day on Sun Tue Thu Sat Rise Patience, MD   300 mg at 10/09/17 1287  . labetalol (NORMODYNE) tablet 300 mg  300 mg Oral QHS Rise Patience, MD      . labetalol (NORMODYNE,TRANDATE) injection 10 mg  10 mg Intravenous Q2H PRN Rise Patience, MD   10 mg at 10/09/17 0530  . metoCLOPramide (REGLAN) tablet 10 mg  10 mg Oral TID WC Rise Patience, MD   10 mg at 10/09/17 0824  . ondansetron (ZOFRAN) tablet 4 mg  4 mg Oral Q6H PRN Rise Patience, MD       Or  . ondansetron [facility]) injection 4 mg  4 mg Intravenous Q6H PRN Rise Patience, MD   4 mg at 10/09/17 8676  . pregabalin (LYRICA) capsule 75 mg  75 mg Oral QHS Rise Patience, MD      . promethazine (PHENERGAN) tablet 25 mg  25 mg Oral Q8H PRN Rise Patience, MD   25 mg at 10/09/17 7209  . rosuvastatin (CRESTOR) tablet 10 mg  10 mg Oral QHS Rise Patience, MD       Labs: Basic Metabolic Panel: Recent Labs  Lab 10/08/17 1535 10/09/17 0514  NA 132* 130*  K 5.6* 4.9  CL 89* 89*  CO2 27 24  GLUCOSE 132* 145*  BUN 55* 54*  CREATININE 13.02* 13.30*  CALCIUM 9.1 8.8*   Liver Function Tests: Recent Labs  Lab 10/08/17 1535 10/09/17 0514  AST 18 20  ALT 15 17  ALKPHOS 62 63  BILITOT 0.7 0.8  PROT 6.9 6.9  ALBUMIN 3.8 3.6   Recent Labs  Lab 10/08/17 1535  LIPASE 41   CBC: Recent Labs  Lab 10/08/17 1535 10/09/17 0514  WBC 5.7 7.7  NEUTROABS  --  5.2  HGB 10.3* 10.7*  HCT 31.9* 33.1*  MCV 98.5 97.1  PLT 202 205   Studies/Results: Dg Abd Acute W/chest  Result Date:  10/09/2017 CLINICAL DATA:  End-stage renal dialysis patient presents with nausea x1 week. EXAM: DG ABDOMEN ACUTE W/ 1V CHEST COMPARISON:  None. FINDINGS: There is no  evidence of dilated bowel loops or free intraperitoneal air. Moderate fecal retention within the colon consistent constipation. No bowel obstruction. No radiopaque calculi or other significant radiographic abnormality is seen. Cardiomegaly with mild interstitial edema and trace right effusion. Acute osseous abnormality. IMPRESSION: 1. Increased fecal retention within the colon consistent constipation. No bowel obstruction. 2. Cardiomegaly with mild interstitial edema.  Trace right effusion. Electronically Signed   By: Ashley Royalty M.D.   On: 10/09/2017 00:50   Dialysis Orders:  MWF at [facility] 4hr, 400/800, EDW 92.5kg, 2K/2Ca bath, AVG, heparin 3000 bolus - Venofer 50mg  IV weekly - Calcitriol 1.43mcg PO q HD  Assessment/Plan: 1.  Nausea/vomiting: ?Gastroparesis. Improved with edema. Per primary. 2.  ESRD: Usual MWF schedule, missed last HD. For HD today, then back on MWF. 3.  Hypertension/volume: BP high - continue current meds and UF with HD. 4.  Anemia: Hgb 10.7. Follow. 5.  Metabolic bone disease: Ca ok, Continue home Auryxia and Calcitriol. 6.  T1DM: Per primary. 7.  Dispo: Possibly home after HD today if BP improves as N/V still ok.  Veneta Penton, PA-C 10/09/2017, 10:55 AM  [facility] Pager: [phone]  Pt seen, examined and agree w A/P as above.  [name] MD [employer] pager [phone]   10/09/2017, 12:47 PM

## 2017-10-09 NOTE — Progress Notes (Signed)
Repaged [facility] flow manager, patient needs admission orders. Per flow manager,Dr. Hal Hope is aware of patient needing admission orders. Naomy Esham, Wonda Cheng, Therapist, sports

## 2017-10-09 NOTE — Progress Notes (Signed)
Patient is a 37 year old female with past medical history of diabetes mellitus type 1, diabetic gastroparesis, hypertension, ESRD on dialysis, anemia of chronic disease who presented to the emergency department because of persistent nausea, vomiting.  Last dialysis on Friday.  Patient noted to be hypertensive on presentation.  Patient admitted for possible urinary tract infection, diabetic gastroparesis and hypertensive urgency. Nephrology consulted this morning.  Plan for dialysis today.  She was seen and examined the bedside today.  Her nausea and vomiting has improved.  She has been started on antibiotics for possible UTI.  We will follow-up urine culture.  We will continue to monitor the patient. Patient seen by Dr. Hal Hope this morning.

## 2017-10-09 NOTE — H&P (Signed)
History and Physical    Carrie [name] ERX:[id number] DOB: Jun 29, 1980 DOA: 10/08/2017  PCP: Glendon Axe, MD  Patient coming from: Home.  Chief Complaint: Nausea vomiting.  HPI: Carrie [name] is a 37 y.o. female with history of diabetes mellitus type 1, hypertension, ESRD on hemodialysis, chronic anemia presents to the ER because of persistent nausea vomiting since yesterday morning.  Patient states she had gone to dialysis yesterday but was referred to the ER since patient was not feeling well.  Patient denies any abdominal pain.  Has not moved bowels for last 5 days.  Denies any chest pain or shortness of breath.  Has not taken her medication for last 24 hours but has taken her insulin.  ED Course: In the ER acute abdominal series shows constipation.  UA is consistent with UTI.  Her blood pressure is markedly elevated but denies any chest pain or headache.  Patient admitted for further management of intractable nausea vomiting with hypertensive urgency.  Had not had a dialysis yesterday.  Review of Systems: As per HPI, rest all negative.   Past Medical History:  Diagnosis Date  . Anemia   . Diabetic neuropathy ([place])   . Diabetic retinopathy ([place])   . ESRD (end stage renal disease) on dialysis [facility])    "MWF; [place]" (02/02/2017)  . Gastroparesis   . HA (headache)    "qd if I didn't take my RX" (02/02/2017)  . Hemodialysis access site with arteriovenous graft ([place])   . Hypertension   . Neuropathy   . Renal insufficiency   . Trigeminal neuralgia   . Type I diabetes mellitus ([place])     Past Surgical History:  Procedure Laterality Date  . AV FISTULA PLACEMENT Left 09/07/2016   Procedure: Left arm 1st Stage Basilic AV Fistula;  Surgeon: [name], MD;  Location: [facility] OR;  Service: Vascular;  Laterality: Left;  . DILATION AND CURETTAGE OF UTERUS    . EXCISIONAL HEMORRHOIDECTOMY    . EYE SURGERY    . INSERTION OF DIALYSIS CATHETER Right 09/07/2016   Procedure: INSERTION OF  DIALYSIS CATHETER;  Surgeon: [name], MD;  Location: [facility] OR;  Service: Vascular;  Laterality: Right;  . RETINAL LASER PROCEDURE Bilateral    "related to diabetes"  . REVISON OF ARTERIOVENOUS FISTULA Left 01/15/2017   Procedure: INSERTION OF  AV GORE-TEX GRAFT;  Surgeon: Elam Dutch, MD;  Location: [place];  Service: Vascular;  Laterality: Left;  . TEE WITHOUT CARDIOVERSION N/A 01/11/2017   Procedure: TRANSESOPHAGEAL ECHOCARDIOGRAM (TEE);  Surgeon: Jerline Pain, MD;  Location: [facility] ENDOSCOPY;  Service: Cardiovascular;  Laterality: N/A;     reports that she has never smoked. She has never used smokeless tobacco. She reports that she drinks about 2.4 oz of alcohol per week. She reports that she does not use drugs.  No Known Allergies  Family History  Problem Relation Age of Onset  . Hyperlipidemia Mother   . Asthma Daughter   . Diabetes Maternal Grandmother   . Stroke Maternal Grandmother   . Kidney disease Maternal Grandfather   . Kidney disease Paternal Grandmother     Prior to Admission medications   Medication Sig Start Date End Date Taking? Authorizing Provider  acetaminophen (TYLENOL) 500 MG tablet Take 1,000 mg by mouth every Monday, Wednesday, and Friday with hemodialysis.   Yes [provider]  amLODipine (NORVASC) 10 MG tablet Take 10 mg by mouth at bedtime. 10/01/17  Yes [provider]  carbamazepine (TEGRETOL)  200 MG tablet Take 1 tablet (200 mg total) by mouth at bedtime. 05/07/17  Yes Annita Brod, MD  diphenhydrAMINE (BENADRYL) 25 mg capsule Take 50 mg by mouth every Monday, Wednesday, and Friday with hemodialysis.   Yes [provider]  escitalopram (LEXAPRO) 10 MG tablet Take 10 mg by mouth daily. 09/22/17  Yes [provider]  ferric citrate (AURYXIA) 1 GM 210 MG(Fe) tablet Take 210 mg by mouth 3 (three) times daily with meals.   Yes [provider]  gabapentin (NEURONTIN) 300 MG capsule Take 1 capsule (300 mg  total) by mouth at bedtime. Patient taking differently: Take 900 mg by mouth at bedtime.  09/12/16  Yes Rosita Fire, MD  insulin aspart protamine - aspart (NOVOLOG MIX 70/30 FLEXPEN) (70-30) 100 UNIT/ML FlexPen Inject 0.45 mLs (45 Units total) into the skin daily with breakfast. And pen needles 1/day Patient taking differently: Inject 15-25 Units into the skin See admin instructions. On non-dialysis days inj 25 units in the morning and 15 units at night. On dialysis days inj 15 units in the morning and 15 units at night. 08/21/17  Yes Renato Shin, MD  labetalol (NORMODYNE) 300 MG tablet Take 300 mg by mouth See admin instructions. Take 300mg  tablet at bedtime on diaylsis days M, W, F. Take 300mg  tablet twice daily on all other days 08/25/16  Yes [provider]  LYRICA 75 MG capsule Take 75 mg by mouth at bedtime. 10/01/17  Yes [provider]  metoCLOPramide (REGLAN) 10 MG tablet Take 10 mg by mouth 3 (three) times daily with meals.    Yes [provider]  ondansetron (ZOFRAN) 4 MG tablet Take 1 tablet (4 mg total) by mouth every 8 (eight) hours as needed for nausea or vomiting. 09/12/16  Yes Rosita Fire, MD  promethazine (PHENERGAN) 25 MG tablet Take 1 tablet (25 mg total) by mouth every 8 (eight) hours as needed for nausea or vomiting. 09/12/16  Yes Rosita Fire, MD  rosuvastatin (CRESTOR) 10 MG tablet Take 10 mg by mouth at bedtime. 03/30/17  Yes [provider]  diclofenac sodium (VOLTAREN) 1 % GEL Apply 2 g topically 4 (four) times daily. Patient not taking: Reported on 08/21/2017 01/24/17   Melanee Spry, MD    Physical Exam: Vitals:   10/08/17 2230 10/08/17 2315 10/08/17 2330 10/09/17 0036  BP: (!) 141/114 (!) 181/96 (!) 151/94 (!) 193/105  Pulse: 90 88 89 88  Resp:    16  Temp:    99.2 F (37.3 C)  TempSrc:    Oral  SpO2: 94% 92% 94% 100%  Weight:    97.2 kg (214 lb 4.8 oz)  Height:    5\' 8"  (1.727 m)       Constitutional: Moderately built and nourished. Vitals:   10/08/17 2230 10/08/17 2315 10/08/17 2330 10/09/17 0036  BP: (!) 141/114 (!) 181/96 (!) 151/94 (!) 193/105  Pulse: 90 88 89 88  Resp:    16  Temp:    99.2 F (37.3 C)  TempSrc:    Oral  SpO2: 94% 92% 94% 100%  Weight:    97.2 kg (214 lb 4.8 oz)  Height:    5\' 8"  (1.727 m)   Eyes: Anicteric no pallor. ENMT: No discharge from the ears eyes nose or mouth. Neck: No mass felt.  No JVD appreciated. Respiratory: No rhonchi or crepitations. Cardiovascular: S1-S2 heard no murmurs appreciated. Abdomen: Soft nontender bowel sounds present. Musculoskeletal: No edema.  No  joint effusion. Skin: No rash.  Skin appears warm. Neurologic: Alert awake oriented to time place and person.  Moves all extremities. Psychiatric: Appears normal.  Normal affect.   Labs on Admission: I have personally reviewed following labs and imaging studies  CBC: Recent Labs  Lab 10/08/17 1535  WBC 5.7  HGB 10.3*  HCT 31.9*  MCV 98.5  PLT 016   Basic Metabolic Panel: Recent Labs  Lab 10/08/17 1535  NA 132*  K 5.6*  CL 89*  CO2 27  GLUCOSE 132*  BUN 55*  CREATININE 13.02*  CALCIUM 9.1   GFR: Estimated Creatinine Clearance: 7.4 mL/min (A) (by C-G formula based on SCr of 13.02 mg/dL (H)). Liver Function Tests: Recent Labs  Lab 10/08/17 1535  AST 18  ALT 15  ALKPHOS 62  BILITOT 0.7  PROT 6.9  ALBUMIN 3.8   Recent Labs  Lab 10/08/17 1535  LIPASE 41   No results for input(s): AMMONIA in the last 168 hours. Coagulation Profile: No results for input(s): INR, PROTIME in the last 168 hours. Cardiac Enzymes: No results for input(s): CKTOTAL, CKMB, CKMBINDEX, TROPONINI in the last 168 hours. BNP (last 3 results) No results for input(s): PROBNP in the last 8760 hours. HbA1C: No results for input(s): HGBA1C in the last 72 hours. CBG: Recent Labs  Lab 10/08/17 2206 10/09/17 0043  GLUCAP 149* 174*   Lipid Profile: No  results for input(s): CHOL, HDL, LDLCALC, TRIG, CHOLHDL, LDLDIRECT in the last 72 hours. Thyroid Function Tests: No results for input(s): TSH, T4TOTAL, FREET4, T3FREE, THYROIDAB in the last 72 hours. Anemia Panel: No results for input(s): VITAMINB12, FOLATE, FERRITIN, TIBC, IRON, RETICCTPCT in the last 72 hours. Urine analysis:    Component Value Date/Time   COLORURINE YELLOW 10/08/2017 1700   APPEARANCEUR HAZY (A) 10/08/2017 1700   LABSPEC 1.009 10/08/2017 1700   PHURINE 8.0 10/08/2017 1700   GLUCOSEU >=500 (A) 10/08/2017 1700   HGBUR SMALL (A) 10/08/2017 1700   HGBUR large 05/25/2009 0900   BILIRUBINUR NEGATIVE 10/08/2017 1700   BILIRUBINUR neg 10/23/2012 1810   KETONESUR NEGATIVE 10/08/2017 1700   PROTEINUR >=300 (A) 10/08/2017 1700   UROBILINOGEN 0.2 04/16/2013 2155   NITRITE NEGATIVE 10/08/2017 1700   LEUKOCYTESUR LARGE (A) 10/08/2017 1700   Sepsis Labs: @LABRCNTIP (procalcitonin:4,lacticidven:4) )No results found for this or any previous visit (from the past 240 hour(s)).   Radiological Exams on Admission: Dg Abd Acute W/chest  Result Date: 10/09/2017 CLINICAL DATA:  End-stage renal dialysis patient presents with nausea x1 week. EXAM: DG ABDOMEN ACUTE W/ 1V CHEST COMPARISON:  None. FINDINGS: There is no evidence of dilated bowel loops or free intraperitoneal air. Moderate fecal retention within the colon consistent constipation. No bowel obstruction. No radiopaque calculi or other significant radiographic abnormality is seen. Cardiomegaly with mild interstitial edema and trace right effusion. Acute osseous abnormality. IMPRESSION: 1. Increased fecal retention within the colon consistent constipation. No bowel obstruction. 2. Cardiomegaly with mild interstitial edema.  Trace right effusion. Electronically Signed   By: Ashley Royalty M.D.   On: 10/09/2017 00:50    EKG: Independently reviewed.  Normal sinus rhythm.  Assessment/Plan Principal Problem:   Nausea & vomiting Active  Problems:   Trigeminal neuralgia   Intractable vomiting with nausea   Gastroparesis   ESRD on hemodialysis ([place])   Acute cystitis without hematuria   Hypertensive urgency    1. Intractable nausea vomiting -x-rays show constipation.  Patient symptoms may be likely from gastroparesis.  I have ordered soapsuds enema.  Continue with Reglan.  UA shows possibility of UTI which could also be contributing to patient's symptoms along with uncontrolled blood pressure. 2. Hypertensive urgency -patient was unable to take her home medications.  I have placed patient on PRN IV labetalol along with her home medications of labetalol.  Closely follow blood pressure trends.  I think patient blood pressure will improve with dialysis. 3. ESRD on hemodialysis on Monday Wednesday Friday has not had dialysis yesterday and was referred to the ER due to patient not feeling well.  Please consult nephrologist in a.m.  Does not look to be in fluid overload. 4. Diabetes mellitus type 1 on insulin which will be continued.  Closely follow CBGs since patient has intractable nausea vomiting.  Anion gap is elevated but does not look to be in DKA. 5. Chronic anemia likely from ESRD.  Follow CBC.   DVT prophylaxis: Heparin. Code Status: Full code. Family Communication: Discussed with patient. Disposition Plan: Home. Consults called: None. Admission status: Inpatient.   Rise Patience MD [facility] Pager [phone].  If 7PM-7AM, please contact night-coverage [email or web address] Password [facility]  10/09/2017, 4:45 AM

## 2017-10-09 NOTE — Progress Notes (Signed)
Patient refused soap sud enema at this time. Will attempt again later. Wesly Whisenant, Wonda Cheng, Therapist, sports

## 2017-10-09 NOTE — Progress Notes (Signed)
New Admission Note:   Arrival Method: Via stretcher from [facility] ED Mental Orientation: Alert and oriented x4 Telemetry: Box # 19 Assessment: Completed Skin: Intact IV: Rt FA-NSL Pain: Denies Tubes: N/A Safety Measures: Safety Fall Prevention Plan has been discussed Admission: Completed 75M Orientation: Patient has been orientated to the room, unit and staff.  Family: none at bedside  Orders have been reviewed and implemented. Will continue to monitor the patient. Call light has been placed within reach and bed alarm has been activated.   Byanka Landrus [employer], RN-BC Phone number: [phone]

## 2017-10-09 NOTE — Progress Notes (Signed)
Soap sud enema administered. Patient tolerated it well. Carrie [name], Carrie [name], Therapist, sports

## 2017-10-10 DIAGNOSIS — E1143 Type 2 diabetes mellitus with diabetic autonomic (poly)neuropathy: Secondary | ICD-10-CM

## 2017-10-10 DIAGNOSIS — K3184 Gastroparesis: Secondary | ICD-10-CM

## 2017-10-10 DIAGNOSIS — I160 Hypertensive urgency: Secondary | ICD-10-CM

## 2017-10-10 DIAGNOSIS — Z992 Dependence on renal dialysis: Secondary | ICD-10-CM

## 2017-10-10 DIAGNOSIS — N186 End stage renal disease: Secondary | ICD-10-CM

## 2017-10-10 LAB — URINE CULTURE

## 2017-10-10 LAB — CBC
HCT: 32 % — ABNORMAL LOW (ref 36.0–46.0)
Hemoglobin: 10.4 g/dL — ABNORMAL LOW (ref 12.0–15.0)
MCH: 31.1 pg (ref 26.0–34.0)
MCHC: 32.5 g/dL (ref 30.0–36.0)
MCV: 95.8 fL (ref 78.0–100.0)
PLATELETS: 207 10*3/uL (ref 150–400)
RBC: 3.34 MIL/uL — AB (ref 3.87–5.11)
RDW: 15.4 % (ref 11.5–15.5)
WBC: 5.6 10*3/uL (ref 4.0–10.5)

## 2017-10-10 LAB — RENAL FUNCTION PANEL
ALBUMIN: 3.2 g/dL — AB (ref 3.5–5.0)
Anion gap: 12 (ref 5–15)
BUN: 29 mg/dL — AB (ref 6–20)
CALCIUM: 8.7 mg/dL — AB (ref 8.9–10.3)
CHLORIDE: 93 mmol/L — AB (ref 98–111)
CO2: 26 mmol/L (ref 22–32)
CREATININE: 8.75 mg/dL — AB (ref 0.44–1.00)
GFR calc non Af Amer: 5 mL/min — ABNORMAL LOW (ref >60)
GFR, EST AFRICAN AMERICAN: 6 mL/min — AB (ref >60)
Glucose, Bld: 203 mg/dL — ABNORMAL HIGH (ref 70–99)
Phosphorus: 7.4 mg/dL — ABNORMAL HIGH (ref 2.5–4.6)
Potassium: 4.3 mmol/L (ref 3.5–5.1)
SODIUM: 131 mmol/L — AB (ref 135–145)

## 2017-10-10 LAB — GLUCOSE, CAPILLARY
GLUCOSE-CAPILLARY: 252 mg/dL — AB (ref 70–99)
GLUCOSE-CAPILLARY: 208 mg/dL — AB (ref 70–99)
GLUCOSE-CAPILLARY: 117 mg/dL — AB (ref 70–99)
Glucose-Capillary: 134 mg/dL — ABNORMAL HIGH (ref 70–99)
Glucose-Capillary: 216 mg/dL — ABNORMAL HIGH (ref 70–99)

## 2017-10-10 LAB — MRSA PCR SCREENING: MRSA by PCR: NEGATIVE

## 2017-10-10 MED ORDER — DIPHENHYDRAMINE HCL 25 MG PO CAPS
25.0000 mg | ORAL_CAPSULE | Freq: Once | ORAL | Status: AC
  Administered 2017-10-10: 25 mg via ORAL
  Filled 2017-10-10: qty 1

## 2017-10-10 MED ORDER — INSULIN ASPART PROT & ASPART (70-30 MIX) 100 UNIT/ML PEN: 15.0000 [IU] | PEN_INJECTOR | SUBCUTANEOUS | Status: DC

## 2017-10-10 MED ORDER — GABAPENTIN 300 MG PO CAPS: 900.0000 mg | ORAL_CAPSULE | Freq: Every day | ORAL | Status: DC

## 2017-10-10 MED ORDER — LIDOCAINE HCL (PF) 1 % IJ SOLN
INTRAMUSCULAR | Status: AC
  Filled 2017-10-10: qty 2

## 2017-10-10 NOTE — Discharge Summary (Signed)
Physician Discharge Summary  Carrie [name] BMW:[id number] DOB: [birth year]  PCP: Glendon Axe, MD  Admit date: 10/08/2017 Discharge date: 10/10/2017  Recommendations for Outpatient Follow-up:  1. Dr. Glendon Axe, PCP in 5 days.  PCP to address during follow-up if 1 of 2 medications that she is on i.e. 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 2. Hemodialysis Center: Continue regular hemodialysis on Mondays, Wednesdays and Fridays. 3. Recommend repeating urine microscopy in a couple of weeks to reassess microscopic hematuria.  Home Health: None Equipment/Devices: None  Discharge Condition: Improved and stable CODE STATUS: Full Diet recommendation: Heart healthy & diabetic diet.  Discharge Diagnoses:  Principal Problem:   Nausea & vomiting Active Problems:   Trigeminal neuralgia   Intractable vomiting with nausea   Gastroparesis   ESRD on hemodialysis ([place])   Acute cystitis without hematuria   Hypertensive urgency   Brief Summary: 37 year old female with PMH of DM type I since age 68 complicated by diabetic gastroparesis, peripheral neuropathy, retinopathy and ESRD, chronic nausea and couple of flares per year, HTN, ESRD on MWF HD, chronic anemia, presented to ED from dialysis center due to persistent nausea and vomiting since the day prior to admission.  In the ED abdominal x-rays revealed constipation.  She was admitted for further evaluation and management.  Nephrology was consulted.  Assessment and plan:  1. Intractable nausea and vomiting/diabetic gastroparesis flare: Patient reports chronic almost daily nausea with periodic flares a couple times a year.  Her presentation was likely an episode of diabetic gastroparesis flare.  She was treated supportively with antiemetics and Reglan.  No further nausea and vomiting.  Tolerating diet.  She is managed at home with Reglan, PRN Zofran and as needed Phenergan if Zofran not effective.  Pregnancy test was  negative. 2. Constipation: Had a large BM following an enema on night of admission.  Patient denies chronic constipation and is advised to use OTC bowel regimen and she verbalizes understanding. 3. Asymptomatic bacteriuria: Patient denies symptoms suggestive of UTI.  She was empirically started on IV ceftriaxone but will not be discharged on any antibiotics at discharge.  Urine culture shows multiple species likely suggestive of contamination. 4. Hypertensive urgency: Due to inability to take medications on admission from nausea and vomiting.  Mildly uncontrolled but better than on admission.  Continue amlodipine and labetalol.  Volume management across dialysis.  Outpatient follow-up. 5. ESRD on MWF HD: Nephrology was consulted and patient underwent HD.  Clinically not volume overloaded.  I discussed with nephrology who have cleared her for discharge home. 6. Mild hyperkalemia: Resolved after dialysis. 7. Type I DM complicated by ESRD, peripheral neuropathy, retinopathy and diabetic gastroparesis: CBGs mildly uncontrolled and fluctuating.  Continue prior home dose of insulins.  Outpatient follow-up.  No DKA.  Of note patient has been on gabapentin and Lyrica both possibly for peripheral neuropathy and not sure if she is on Tegretol also for same.  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  A1c 7.9 on 08/21/2017. 8. Anemia in ESRD: Stable.  Continue iron supplements. 9. Hyperlipidemia: Continue statins. 10. Microscopic hematuria: Unclear etiology.  Recommend repeating urine microscopy in approximately 2 weeks to reassess and if persistent may need further evaluation.   Consultations:  Nephrology  Procedures:  Hemodialysis   Discharge Instructions  Discharge Instructions    (HEART FAILURE PATIENTS) Call MD:  Anytime you have any of the following  symptoms: 1) 3 pound weight gain in 24  hours or 5 pounds in 1 week 2) shortness of breath, with or without a dry hacking cough 3) swelling in the hands, feet or stomach 4) if you have to sleep on extra pillows at night in order to breathe.   Complete by:  As directed    Call MD for:  difficulty breathing, headache or visual disturbances   Complete by:  As directed    Call MD for:  extreme fatigue   Complete by:  As directed    Call MD for:  persistant dizziness or light-headedness   Complete by:  As directed    Call MD for:  persistant nausea and vomiting   Complete by:  As directed    Call MD for:  severe uncontrolled pain   Complete by:  As directed    Call MD for:  temperature >100.4   Complete by:  As directed    Diet - low sodium heart healthy   Complete by:  As directed    Diet Carb Modified   Complete by:  As directed    Increase activity slowly   Complete by:  As directed        Medication List    STOP taking these medications   diclofenac sodium 1 % Gel Commonly known as:  VOLTAREN     TAKE these medications   acetaminophen 500 MG tablet Commonly known as:  TYLENOL Take 1,000 mg by mouth every Monday, Wednesday, and Friday with hemodialysis.   amLODipine 10 MG tablet Commonly known as:  NORVASC Take 10 mg by mouth at bedtime.   carbamazepine 200 MG tablet Commonly known as:  TEGRETOL Take 1 tablet (200 mg total) by mouth at bedtime.   diphenhydrAMINE 25 mg capsule Commonly known as:  BENADRYL Take 50 mg by mouth every Monday, Wednesday, and Friday with hemodialysis.   escitalopram 10 MG tablet Commonly known as:  LEXAPRO Take 10 mg by mouth daily.   ferric citrate 1 GM 210 MG(Fe) tablet Commonly known as:  AURYXIA Take 210 mg by mouth 3 (three) times daily with meals.   gabapentin 300 MG capsule Commonly known as:  NEURONTIN Take 3 capsules (900 mg total) by mouth at bedtime.   insulin aspart protamine - aspart (70-30) 100 UNIT/ML FlexPen Commonly known as:  NOVOLOG MIX 70/30  FLEXPEN Inject 0.15-0.25 mLs (15-25 Units total) into the skin See admin instructions. On non-dialysis days inj 25 units in the morning and 15 units at night. On dialysis days inj 15 units in the morning and 15 units at night. What changed:    how much to take  when to take this  additional instructions   labetalol 300 MG tablet Commonly known as:  NORMODYNE Take 300 mg by mouth See admin instructions. Take 300mg  tablet at bedtime on diaylsis days M, W, F. Take 300mg  tablet twice daily on all other days   LYRICA 75 MG capsule Generic drug:  pregabalin Take 75 mg by mouth at bedtime.   ondansetron 4 MG tablet Commonly known as:  ZOFRAN Take 1 tablet (4 mg total) by mouth every 8 (eight) hours as needed for nausea or vomiting.   promethazine 25 MG tablet Commonly known as:  PHENERGAN Take 1 tablet (25 mg total) by mouth every 8 (eight) hours as needed for nausea or vomiting.   REGLAN 10 MG tablet Generic drug:  metoCLOPramide Take 10 mg by mouth 3 (three) times daily with meals.  rosuvastatin 10 MG tablet Commonly known as:  CRESTOR Take 10 mg by mouth at bedtime.      Follow-up Information    Glendon Axe, MD. Schedule an appointment as soon as possible for a visit in 5 day(s).   Specialty:  Family Medicine Why:  Patient is on both gabapentin and Lyrica.  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 Contact information: [address] [place] [address] [phone]        Hemodialysis Center Follow up.   Why:  Continue regular hemodialysis on Mondays, Wednesdays and Fridays.         No Known Allergies    Procedures/Studies: Dg Abd Acute W/chest  Result Date: 10/09/2017 CLINICAL DATA:  End-stage renal dialysis patient presents with nausea x1 week. EXAM: DG ABDOMEN ACUTE W/ 1V CHEST COMPARISON:  None. FINDINGS: There is no evidence of dilated bowel loops or free intraperitoneal air.  Moderate fecal retention within the colon consistent constipation. No bowel obstruction. No radiopaque calculi or other significant radiographic abnormality is seen. Cardiomegaly with mild interstitial edema and trace right effusion. Acute osseous abnormality. IMPRESSION: 1. Increased fecal retention within the colon consistent constipation. No bowel obstruction. 2. Cardiomegaly with mild interstitial edema.  Trace right effusion. Electronically Signed   By: Ashley Royalty M.D.   On: 10/09/2017 00:50      Subjective: Patient states that she feels much better.  Chronic nausea which she states that she has daily and is back to baseline.  No nausea or vomiting since yesterday.  Tolerating diet.  No abdominal pain reported.  Reports having a large BM after enema last night but denies chronic constipation.  No dyspnea or chest pain reported.  Discharge Exam:  Vitals:   10/09/17 1830 10/09/17 2006 10/10/17 0526 10/10/17 0932  BP: (!) 162/90 (!) 155/79 (!) 157/85 (!) 170/87  Pulse: 80 87 84 85  Resp: 16 16 14 20   Temp: 98 F (36.7 C) 99.1 F (37.3 C) 98.8 F (37.1 C) 98.7 F (37.1 C)  TempSrc: Oral Oral Oral Oral  SpO2: 98% 98% 97% 100%  Weight: 93 kg (205 lb 0.4 oz) 93 kg (205 lb 0.4 oz)    Height:        General: Pleasant young female, moderately built and overweight lying comfortably propped up in bed.  Oral mucosa moist. Cardiovascular: S1 & S2 heard, RRR, S1/S2 +. No murmurs, rubs, gallops or clicks. No JVD or pedal edema.  Telemetry personally reviewed: Sinus rhythm. Respiratory: Clear to auscultation without wheezing, rhonchi or crackles. No increased work of breathing. Abdominal:  Non distended, non tender & soft. No organomegaly or masses appreciated. Normal bowel sounds heard. CNS: Alert and oriented. No focal deficits. Extremities: no edema, no cyanosis    The results of significant diagnostics from this hospitalization (including imaging, microbiology, ancillary and laboratory)  are listed below for reference.     Microbiology: Recent Results (from the past 240 hour(s))  Urine culture     Status: Abnormal   Collection Time: 10/08/17  9:56 PM  Result Value Ref Range Status   Specimen Description URINE, RANDOM  Final   Special Requests   Final    NONE Performed at [facility], [address], [place], [place] [address]    Culture MULTIPLE SPECIES PRESENT, SUGGEST RECOLLECTION (A)  Final   Report Status 10/10/2017 FINAL  Final  MRSA PCR Screening     Status: None  Collection Time: 10/10/17  3:19 AM  Result Value Ref Range Status   MRSA by PCR NEGATIVE NEGATIVE Final    Comment:        The GeneXpert MRSA Assay (FDA approved for NASAL specimens only), is one component of a comprehensive MRSA colonization surveillance program. It is not intended to diagnose MRSA infection nor to guide or monitor treatment for MRSA infections. Performed at [facility], [place] [address] [place], [place] [address]      Labs: CBC: Recent Labs  Lab 10/08/17 1535 10/09/17 0514 10/10/17 1258  WBC 5.7 7.7 5.6  NEUTROABS  --  5.2  --   HGB 10.3* 10.7* 10.4*  HCT 31.9* 33.1* 32.0*  MCV 98.5 97.1 95.8  PLT 202 205 387   Basic Metabolic Panel: Recent Labs  Lab 10/08/17 1535 10/09/17 0514  NA 132* 130*  K 5.6* 4.9  CL 89* 89*  CO2 27 24  GLUCOSE 132* 145*  BUN 55* 54*  CREATININE 13.02* 13.30*  CALCIUM 9.1 8.8*   Liver Function Tests: Recent Labs  Lab 10/08/17 1535 10/09/17 0514  AST 18 20  ALT 15 17  ALKPHOS 62 63  BILITOT 0.7 0.8  PROT 6.9 6.9  ALBUMIN 3.8 3.6   CBG: Recent Labs  Lab 10/09/17 0751 10/09/17 1144 10/09/17 2233 10/10/17 0803 10/10/17 1124  GLUCAP 164* 129* 252* 134* 208*    Urinalysis    Component Value Date/Time   COLORURINE YELLOW 10/08/2017 1700   APPEARANCEUR HAZY (A) 10/08/2017 1700   LABSPEC 1.009 10/08/2017 1700   PHURINE 8.0 10/08/2017 1700   GLUCOSEU >=500 (A) 10/08/2017 1700   HGBUR SMALL (A)  10/08/2017 1700   HGBUR large 05/25/2009 0900   BILIRUBINUR NEGATIVE 10/08/2017 1700   BILIRUBINUR neg 10/23/2012 1810   KETONESUR NEGATIVE 10/08/2017 1700   PROTEINUR >=300 (A) 10/08/2017 1700   UROBILINOGEN 0.2 04/16/2013 2155   NITRITE NEGATIVE 10/08/2017 1700   LEUKOCYTESUR LARGE (A) 10/08/2017 1700      Time coordinating discharge: 40 minutes  SIGNED:  Vernell Leep, MD, FACP, [facility]. [facility] Pager [phone]  If 7PM-7AM, please contact night-coverage [email or web address] Password [email or web address] 10/10/2017, 1:10 PM

## 2017-10-10 NOTE — Progress Notes (Addendum)
[facility] Progress Note   Subjective:  Seen in room, looks improved. Still with some nausea, but no vomiting this morning and has eaten some jello. No CP/dyspnea.    Objective Vitals:   10/09/17 1830 10/09/17 2006 10/10/17 0526 10/10/17 0932  BP: (!) 162/90 (!) 155/79 (!) 157/85 (!) 170/87  Pulse: 80 87 84 85  Resp: 16 16 14 20   Temp: 98 F (36.7 C) 99.1 F (37.3 C) 98.8 F (37.1 C) 98.7 F (37.1 C)  TempSrc: Oral Oral Oral Oral  SpO2: 98% 98% 97% 100%  Weight: 93 kg (205 lb 0.4 oz) 93 kg (205 lb 0.4 oz)    Height:       Physical Exam General: Well appearing, NAD Heart: RRR; no murmur Lungs: CTAB Extremities: No LE edema Dialysis Access:  AVG + thrill  Additional Objective Labs: Basic Metabolic Panel: Recent Labs  Lab 10/08/17 1535 10/09/17 0514  NA 132* 130*  K 5.6* 4.9  CL 89* 89*  CO2 27 24  GLUCOSE 132* 145*  BUN 55* 54*  CREATININE 13.02* 13.30*  CALCIUM 9.1 8.8*   Liver Function Tests: Recent Labs  Lab 10/08/17 1535 10/09/17 0514  AST 18 20  ALT 15 17  ALKPHOS 62 63  BILITOT 0.7 0.8  PROT 6.9 6.9  ALBUMIN 3.8 3.6   Recent Labs  Lab 10/08/17 1535  LIPASE 41   CBC: Recent Labs  Lab 10/08/17 1535 10/09/17 0514  WBC 5.7 7.7  NEUTROABS  --  5.2  HGB 10.3* 10.7*  HCT 31.9* 33.1*  MCV 98.5 97.1  PLT 202 205   Blood Culture    Component Value Date/Time   SDES URINE, RANDOM 10/08/2017 2156   SPECREQUEST  10/08/2017 2156    NONE Performed at [facility], [place] [address] [place], [place] [address]    CULT MULTIPLE SPECIES PRESENT, SUGGEST RECOLLECTION (A) 10/08/2017 2156   REPTSTATUS 10/10/2017 FINAL 10/08/2017 2156   CBG: Recent Labs  Lab 10/09/17 0043 10/09/17 0751 10/09/17 1144 10/09/17 2233 10/10/17 0803  GLUCAP 174* 164* 129* 252* 134*   Studies/Results: Dg Abd Acute W/chest  Result Date: 10/09/2017 CLINICAL DATA:  End-stage renal dialysis patient presents with nausea x1 week. EXAM: DG ABDOMEN  ACUTE W/ 1V CHEST COMPARISON:  None. FINDINGS: There is no evidence of dilated bowel loops or free intraperitoneal air. Moderate fecal retention within the colon consistent constipation. No bowel obstruction. No radiopaque calculi or other significant radiographic abnormality is seen. Cardiomegaly with mild interstitial edema and trace right effusion. Acute osseous abnormality. IMPRESSION: 1. Increased fecal retention within the colon consistent constipation. No bowel obstruction. 2. Cardiomegaly with mild interstitial edema.  Trace right effusion. Electronically Signed   By: Ashley Royalty M.D.   On: 10/09/2017 00:50   Medications: . cefTRIAXone (ROCEPHIN)  IV 1 g (10/09/17 2221)   . amLODipine  10 mg Oral QHS  . carbamazepine  200 mg Oral QHS  . Chlorhexidine Gluconate Cloth  6 each Topical Q0600  . escitalopram  10 mg Oral Daily  . ferric citrate  210 mg Oral TID WC  . gabapentin  300 mg Oral QHS  . heparin  5,000 Units Subcutaneous Q8H  . insulin aspart  0-9 Units Subcutaneous TID WC  . insulin aspart protamine- aspart  15 Units Subcutaneous Irregular times every day  . insulin aspart protamine- aspart  25 Units Subcutaneous Once per day on Sun Tue Thu Sat  . labetalol  300 mg Oral Once per day  on Sun Tue Thu Sat  . labetalol  300 mg Oral QHS  . metoCLOPramide  10 mg Oral TID WC  . pregabalin  75 mg Oral QHS  . rosuvastatin  10 mg Oral QHS    Dialysis Orders: MWF at [place], 400/800, EDW 92.5kg, 2K/2Ca bath, AVG, heparin 3000 bolus - Venofer 50mg  IV weekly - Calcitriol 1.61mcg PO q HD  Assessment/Plan: 1.  Nausea/vomiting: ?Gastroparesis. Improved with enema. Per primary. 2.  ESRD: S/p HD yesterday as make-up for Mon, next HD today per MWF schedule. 3.  Hypertension/volume: BP high, improving. Continue current meds and UF with HD. 4.  Anemia: Hgb 10.7. Follow. 5.  Metabolic bone disease: Ca ok, Continue home Auryxia and Calcitriol. 6.  T1DM: Per primary. 7.  UTI:  On Ceftriaxone. 8.  Dispo: Possibly home after HD today if N/V remains ok.   Veneta Penton, PA-C 10/10/2017, 11:22 AM  [facility] Pager: [phone]  Pt seen, examined and agree w A/P as above.  [name] MD [employer] pager [phone]   10/10/2017, 12:16 PM

## 2017-10-10 NOTE — Discharge Instructions (Signed)
Please get your medications reviewed and adjusted by your Primary MD. ° °Please request your Primary MD to go over all Hospital Tests and Procedure/Radiological results at the follow up, please get all Hospital records sent to your Prim MD by signing hospital release before you go home. ° °If you had Pneumonia of Lung problems at the Hospital: °Please get a 2 view Chest X ray done in 6-8 weeks after hospital discharge or sooner if instructed by your Primary MD. ° °If you have Congestive Heart Failure: °Please call your Cardiologist or Primary MD anytime you have any of the following symptoms:  °1) 3 pound weight gain in 24 hours or 5 pounds in 1 week  °2) shortness of breath, with or without a dry hacking cough  °3) swelling in the hands, feet or stomach  °4) if you have to sleep on extra pillows at night in order to breathe ° °Follow cardiac low salt diet and 1.5 lit/day fluid restriction. ° °If you have diabetes °Accuchecks 4 times/day, Once in AM empty stomach and then before each meal. °Log in all results and show them to your primary doctor at your next visit. °If any glucose reading is under 80 or above 300 call your primary MD immediately. ° °If you have Seizure/Convulsions/Epilepsy: °Please do not drive, operate heavy machinery, participate in activities at heights or participate in high speed sports until you have seen by Primary MD or a Neurologist and advised to do so again. ° °If you had Gastrointestinal Bleeding: °Please ask your Primary MD to check a complete blood count within one week of discharge or at your next visit. Your endoscopic/colonoscopic biopsies that are pending at the time of discharge, will also need to followed by your Primary MD. ° °Get Medicines reviewed and adjusted. °Please take all your medications with you for your next visit with your Primary MD ° °Please request your Primary MD to go over all hospital tests and procedure/radiological results at the follow up, please ask your  Primary MD to get all Hospital records sent to his/her office. ° °If you experience worsening of your admission symptoms, develop shortness of breath, life threatening emergency, suicidal or homicidal thoughts you must seek medical attention immediately by calling 911 or calling your MD immediately  if symptoms less severe. ° °You must read complete instructions/literature along with all the possible adverse reactions/side effects for all the Medicines you take and that have been prescribed to you. Take any new Medicines after you have completely understood and accpet all the possible adverse reactions/side effects.  ° °Do not drive or operate heavy machinery when taking Pain medications.  ° °Do not take more than prescribed Pain, Sleep and Anxiety Medications ° °Special Instructions: If you have smoked or chewed Tobacco  in the last 2 yrs please stop smoking, stop any regular Alcohol  and or any Recreational drug use. ° °Wear Seat belts while driving. ° °Please note °You were cared for by a hospitalist during your hospital stay. If you have any questions about your discharge medications or the care you received while you were in the hospital after you are discharged, you can call the unit and asked to speak with the hospitalist on call if the hospitalist that took care of you is not available. Once you are discharged, your primary care physician will handle any further medical issues. Please note that NO REFILLS for any discharge medications will be authorized once you are discharged, as it is imperative that you   return to your primary care physician (or establish a relationship with a primary care physician if you do not have one) for your aftercare needs so that they can reassess your need for medications and monitor your lab values.  You can reach the hospitalist office at phone [phone] or fax [phone]   If you do not have a primary care physician, you can call [phone] for a physician  referral.   Gastroparesis Gastroparesis, also called delayed gastric emptying, is a condition in which food takes longer than normal to empty from the stomach. The condition is usually long-lasting (chronic). What are the causes? This condition may be caused by:  An endocrine disorder, such as hypothyroidism or diabetes. Diabetes is the most common cause of this condition.  A nervous system disease, such as Parkinson disease or multiple sclerosis.  Cancer, infection, or surgery of the stomach or vagus nerve.  A connective tissue disorder, such as scleroderma.  Certain medicines.  In most cases, the cause is not known. What increases the risk? This condition is more likely to develop in:  People with certain disorders, including endocrine disorders, eating disorders, amyloidosis, and scleroderma.  People with certain diseases, including Parkinson disease or multiple sclerosis.  People with cancer or infection of the stomach or vagus nerve.  People who have had surgery on the stomach or vagus nerve.  People who take certain medicines.  Women.  What are the signs or symptoms? Symptoms of this condition include:  An early feeling of fullness when eating.  Nausea.  Weight loss.  Vomiting.  Heartburn.  Abdominal bloating.  Inconsistent blood glucose levels.  Lack of appetite.  Acid from the stomach coming up into the esophagus (gastroesophageal reflux).  Spasms of the stomach.  Symptoms may come and go. How is this diagnosed? This condition is diagnosed with tests, such as:  Tests that check how long it takes food to move through the stomach and intestines. These tests include: ? Upper gastrointestinal (GI) series. In this test, X-rays of the intestines are taken after you drink a liquid. The liquid makes the intestines show up better on the X-rays. ? Gastric emptying scintigraphy. In this test, scans are taken after you eat food that contains a small amount  of radioactive material. ? Wireless capsule GI monitoring system. This test involves swallowing a capsule that records information about movement through the stomach.  Gastric manometry. This test measures electrical and muscular activity in the stomach. It is done with a thin tube that is passed down the throat and into the stomach.  Endoscopy. This test checks for abnormalities in the lining of the stomach. It is done with a long, thin tube that is passed down the throat and into the stomach.  An ultrasound. This test can help rule out gallbladder disease or pancreatitis as a cause of your symptoms. It uses sound waves to take pictures of the inside of your body.  How is this treated? There is no cure for gastroparesis. This condition may be managed with:  Treatment of the underlying condition causing the gastroparesis.  Lifestyle changes, including exercise and dietary changes. Dietary changes can include: ? Changes in what and when you eat. ? Eating smaller meals more often. ? Eating low-fat foods. ? Eating low-fiber forms of high-fiber foods, such as cooked vegetables instead of raw vegetables. ? Having liquid foods in place of solid foods. Liquid foods are easier to digest.  Medicines. These may be given to control nausea and vomiting and  to stimulate stomach muscles.  Getting food through a feeding tube. This may be done in severe cases.  A gastric neurostimulator. This is a device that is inserted into the body with surgery. It helps improve stomach emptying and control nausea and vomiting.  Follow these instructions at home:  Follow your health care provider's instructions about exercise and diet.  Take medicines only as directed by your health care provider. Contact a health care provider if:  Your symptoms do not improve with treatment.  You have new symptoms. Get help right away if:  You have severe abdominal pain that does not improve with treatment.  You have  nausea that does not go away.  You cannot keep fluids down. This information is not intended to replace advice given to you by your health care provider. Make sure you discuss any questions you have with your health care provider. Document Released: 04/03/2005 Document Revised: 09/09/2015 Document Reviewed: 03/30/2014 Elsevier Interactive Patient Education  [employer].

## 2017-10-10 NOTE — Progress Notes (Signed)
Patient discharged to home. AVS reviewed, Iv removed, telebox returned. Patient is eating dinner waiting for ride

## 2017-11-06 ENCOUNTER — Ambulatory Visit: Payer: BLUE CROSS/BLUE SHIELD | Admitting: Endocrinology

## 2017-11-06 ENCOUNTER — Encounter: Payer: Self-pay | Admitting: Endocrinology

## 2017-11-06 VITALS — BP 148/74 | HR 90 | Wt 206.0 lb

## 2017-11-06 DIAGNOSIS — Z794 Long term (current) use of insulin: Secondary | ICD-10-CM | POA: Diagnosis not present

## 2017-11-06 DIAGNOSIS — E1122 Type 2 diabetes mellitus with diabetic chronic kidney disease: Secondary | ICD-10-CM

## 2017-11-06 DIAGNOSIS — N186 End stage renal disease: Secondary | ICD-10-CM

## 2017-11-06 DIAGNOSIS — Z992 Dependence on renal dialysis: Secondary | ICD-10-CM | POA: Diagnosis not present

## 2017-11-06 LAB — POCT GLYCOSYLATED HEMOGLOBIN (HGB A1C): HEMOGLOBIN A1C: 8.6 % — AB (ref 4.0–5.6)

## 2017-11-06 NOTE — Patient Instructions (Addendum)
Please take 35 units with breakfast on dialysis days, and 45 units (also with breakfast), on off days.   In view of your medical condition, you should avoid pregnancy until we have decided it is safe.   check your blood sugar 5 times a day: before the 3 meals, and at bedtime.  also check after meals, on a rotating basis.  if you have symptoms of your blood sugar being too high or too low.  please keep a record of the readings and bring it to your next appointment here (or you can bring the meter itself).  You can write it on any piece of paper.  please call us sooner if your blood sugar goes below 70, or if you have a lot of readings over 200.  Please come back for a follow-up appointment in 2 months.

## 2017-11-06 NOTE — Progress Notes (Signed)
Subjective:    Patient ID: Carrie [name], female    DOB: November 03, 1980, 37 y.o.   MRN: [id number]  HPI Pt returns for f/u of diabetes mellitus: DM type: Insulin-requiring type 2.   Dx'ed: 5284 Complications: polyneuropathy, renal insufficiency, and retinopathy.   Therapy: insulin since 2003 pregnancy.  GDM: never DKA: never Severe hypoglycemia: last episode was in 2019.  Pancreatitis: never.  Pancreatic imaging: normal on 2018 CT.   Other: she declines multiple daily injections; she requires less insulin in dialysis days.   Interval history:  Pt says she has been taking the insulin according to "sliding'scale."  She recently had several more episodes of severe hypoglycemia.  She sometimes misses the insulin altogether.  She says this has happened on HD and non-HD days.   Past Medical History:  Diagnosis Date  . Anemia   . Diabetic neuropathy ([place])   . Diabetic retinopathy ([place])   . ESRD (end stage renal disease) on dialysis [facility])    "MWF; [place]" (02/02/2017)  . Gastroparesis   . HA (headache)    "qd if I didn't take my RX" (02/02/2017)  . Hemodialysis access site with arteriovenous graft ([place])   . Hypertension   . Neuropathy   . Renal insufficiency   . Trigeminal neuralgia   . Type I diabetes mellitus ([place])     Past Surgical History:  Procedure Laterality Date  . AV FISTULA PLACEMENT Left 09/07/2016   Procedure: Left arm 1st Stage Basilic AV Fistula;  Surgeon: [name], MD;  Location: [facility] OR;  Service: Vascular;  Laterality: Left;  . DILATION AND CURETTAGE OF UTERUS    . EXCISIONAL HEMORRHOIDECTOMY    . EYE SURGERY    . INSERTION OF DIALYSIS CATHETER Right 09/07/2016   Procedure: INSERTION OF DIALYSIS CATHETER;  Surgeon: [name], MD;  Location: [facility] OR;  Service: Vascular;  Laterality: Right;  . RETINAL LASER PROCEDURE Bilateral    "related to diabetes"  . REVISON OF ARTERIOVENOUS FISTULA Left 01/15/2017   Procedure: INSERTION OF  AV GORE-TEX GRAFT;  Surgeon:  Elam Dutch, MD;  Location: [facility];  Service: Vascular;  Laterality: Left;  . TEE WITHOUT CARDIOVERSION N/A 01/11/2017   Procedure: TRANSESOPHAGEAL ECHOCARDIOGRAM (TEE);  Surgeon: Jerline Pain, MD;  Location: [facility] ENDOSCOPY;  Service: Cardiovascular;  Laterality: N/A;    Social History   Socioeconomic History  . Marital status: Single    Spouse name: Not on file  . Number of children: 1  . Years of education: 4  . Highest education level: Not on file  Occupational History  . Occupation: Call Center    Employer: [employer]  . Financial resource strain: Not on file  . Food insecurity:    Worry: Not on file    Inability: Not on file  . Transportation needs:    Medical: Not on file    Non-medical: Not on file  Tobacco Use  . Smoking status: Never Smoker  . Smokeless tobacco: Never Used  Substance and Sexual Activity  . Alcohol use: Yes    Alcohol/week: 2.4 oz    Types: 4 Standard drinks or equivalent per week    Comment: 02/02/2017 "nothing in the last 6 months; never had problem w/it"  . Drug use: No  . Sexual activity: Yes  Lifestyle  . Physical activity:    Days per week: Not on file    Minutes per session: Not on file  . Stress: Not on file  Relationships  . Social connections:    Talks on phone: Not on file    Gets together: Not on file    Attends religious service: Not on file    Active member of club or organization: Not on file    Attends meetings of clubs or organizations: Not on file    Relationship status: Not on file  . Intimate partner violence:    Fear of current or ex partner: Not on file    Emotionally abused: Not on file    Physically abused: Not on file    Forced sexual activity: Not on file  Other Topics Concern  . Not on file  Social History Narrative   Patient lives at home with her father and daughter.    Education some college.   Right handed   Caffeine Five sodas daily.          Current Outpatient Medications  on File Prior to Visit  Medication Sig Dispense Refill  . acetaminophen (TYLENOL) 500 MG tablet Take 1,000 mg by mouth every Monday, Wednesday, and Friday with hemodialysis.    Marland Kitchen amLODipine (NORVASC) 10 MG tablet Take 10 mg by mouth at bedtime.  3  . carbamazepine (TEGRETOL) 200 MG tablet Take 1 tablet (200 mg total) by mouth at bedtime.  1  . diphenhydrAMINE (BENADRYL) 25 mg capsule Take 50 mg by mouth every Monday, Wednesday, and Friday with hemodialysis.    Marland Kitchen escitalopram (LEXAPRO) 10 MG tablet Take 10 mg by mouth daily.  5  . ferric citrate (AURYXIA) 1 GM 210 MG(Fe) tablet Take 210 mg by mouth 3 (three) times daily with meals.    . gabapentin (NEURONTIN) 300 MG capsule Take 3 capsules (900 mg total) by mouth at bedtime.    . insulin aspart protamine - aspart (NOVOLOG MIX 70/30 FLEXPEN) (70-30) 100 UNIT/ML FlexPen Inject 0.15-0.25 mLs (15-25 Units total) into the skin See admin instructions. On non-dialysis days inj 25 units in the morning and 15 units at night. On dialysis days inj 15 units in the morning and 15 units at night.    . labetalol (NORMODYNE) 300 MG tablet Take 300 mg by mouth See admin instructions. Take 300mg  tablet at bedtime on diaylsis days M, W, F. Take 300mg  tablet twice daily on all other days  0  . LYRICA 75 MG capsule Take 75 mg by mouth at bedtime.  5  . metoCLOPramide (REGLAN) 10 MG tablet Take 10 mg by mouth 3 (three) times daily with meals.     . ondansetron (ZOFRAN) 4 MG tablet Take 1 tablet (4 mg total) by mouth every 8 (eight) hours as needed for nausea or vomiting. 20 tablet 0  . promethazine (PHENERGAN) 25 MG tablet Take 1 tablet (25 mg total) by mouth every 8 (eight) hours as needed for nausea or vomiting. 10 tablet 0  . rosuvastatin (CRESTOR) 10 MG tablet Take 10 mg by mouth at bedtime.  1   No current facility-administered medications on file prior to visit.     No Known Allergies  Family History  Problem Relation Age of Onset  . Hyperlipidemia Mother     . Asthma Daughter   . Diabetes Maternal Grandmother   . Stroke Maternal Grandmother   . Kidney disease Maternal Grandfather   . Kidney disease Paternal Grandmother     BP (!) 148/74   Pulse 90   Wt 206 lb (93.4 kg)   LMP 07/17/2016 (Within Weeks) Comment: Has not had a period since starting  dialysis in May  SpO2 99%   BMI 31.32 kg/m    Review of Systems No weight change.      Objective:   Physical Exam VITAL SIGNS:  See vs page GENERAL: no distress Pulses: dorsalis pedis intact bilat.   MSK: no deformity of the feet CV: no leg edema Skin:  no ulcer on the feet.  normal color and temp on the feet. Neuro: sensation is intact to touch on the feet.    Lab Results  Component Value Date   HGBA1C 8.6 (A) 11/06/2017       Assessment & Plan:  Insulin-requiring type 2 DM, with DR: worse.  Hypoglycemia, severe, recurrent.  Noncompliance with cbg recording, and insulin dosing: We discussed why this insulin is inappropriate for "sliding scale."   Patient Instructions  Please take 35 units with breakfast on dialysis days, and 45 units (also with breakfast), on off days.   In view of your medical condition, you should avoid pregnancy until we have decided it is safe.   check your blood sugar 5 times a day: before the 3 meals, and at bedtime.  also check after meals, on a rotating basis.  if you have symptoms of your blood sugar being too high or too low.  please keep a record of the readings and bring it to your next appointment here (or you can bring the meter itself).  You can write it on any piece of paper.  please call us sooner if your blood sugar goes below 70, or if you have a lot of readings over 200.  Please come back for a follow-up appointment in 2 months.

## 2017-11-13 ENCOUNTER — Telehealth: Payer: Self-pay | Admitting: Endocrinology

## 2017-11-13 ENCOUNTER — Telehealth: Payer: Self-pay

## 2017-11-13 NOTE — Telephone Encounter (Signed)
LM for pt to call back to relay this information

## 2017-11-13 NOTE — Telephone Encounter (Signed)
Patient called back and is requesting she be dismissed because the MD would not listen to her- I informed her she will need to find new Endocrinologist and contact our medical records department when she does and they can fax her records to the new MD

## 2017-11-13 NOTE — Telephone Encounter (Signed)
-----   Message from Renato Shin, MD sent at 11/06/2017 11:43 AM EDT ----- please call patient: Please warn pt about d/c from practice if she does not follow medical advice (check cbg, take insulin as advised).  Thank you.

## 2017-11-13 NOTE — Telephone Encounter (Signed)
Patient called today and I gave her advice from the note below- patient stated she would like to be dismissed she will find another doctor

## 2017-11-16 ENCOUNTER — Encounter: Payer: Self-pay | Admitting: Endocrinology

## 2017-11-26 ENCOUNTER — Telehealth: Payer: Self-pay | Admitting: Endocrinology

## 2017-11-26 NOTE — Telephone Encounter (Signed)
Patient dismissed from [facility] by Renato Shin MD, effective November 16, 2017. Dismissal letter sent out by certified / registered mail.  daj

## 2017-12-07 ENCOUNTER — Emergency Department (HOSPITAL_COMMUNITY)
Admission: EM | Admit: 2017-12-07 | Discharge: 2017-12-07 | Disposition: A | Discharge status: Home / Self Care | Payer: BLUE CROSS/BLUE SHIELD | Attending: Emergency Medicine | Admitting: Emergency Medicine

## 2017-12-07 ENCOUNTER — Encounter (HOSPITAL_COMMUNITY): Payer: Self-pay | Admitting: Emergency Medicine

## 2017-12-07 DIAGNOSIS — Z992 Dependence on renal dialysis: Secondary | ICD-10-CM | POA: Diagnosis not present

## 2017-12-07 DIAGNOSIS — N186 End stage renal disease: Secondary | ICD-10-CM | POA: Insufficient documentation

## 2017-12-07 DIAGNOSIS — Y828 Other medical devices associated with adverse incidents: Secondary | ICD-10-CM | POA: Insufficient documentation

## 2017-12-07 DIAGNOSIS — Z79899 Other long term (current) drug therapy: Secondary | ICD-10-CM | POA: Insufficient documentation

## 2017-12-07 DIAGNOSIS — E10319 Type 1 diabetes mellitus with unspecified diabetic retinopathy without macular edema: Secondary | ICD-10-CM | POA: Diagnosis not present

## 2017-12-07 DIAGNOSIS — E1040 Type 1 diabetes mellitus with diabetic neuropathy, unspecified: Secondary | ICD-10-CM | POA: Insufficient documentation

## 2017-12-07 DIAGNOSIS — T82838A Hemorrhage of vascular prosthetic devices, implants and grafts, initial encounter: Secondary | ICD-10-CM | POA: Diagnosis present

## 2017-12-07 DIAGNOSIS — E1022 Type 1 diabetes mellitus with diabetic chronic kidney disease: Secondary | ICD-10-CM | POA: Diagnosis not present

## 2017-12-07 DIAGNOSIS — I120 Hypertensive chronic kidney disease with stage 5 chronic kidney disease or end stage renal disease: Secondary | ICD-10-CM | POA: Diagnosis not present

## 2017-12-07 DIAGNOSIS — R58 Hemorrhage, not elsewhere classified: Secondary | ICD-10-CM

## 2017-12-07 NOTE — ED Triage Notes (Signed)
Per EMS: pt from dialysis with c/o fistula bleeding post treatment.  Upon arrival bleeding is controled, pt denies any pain, pt A/O x4.    EMS vitals:  BP 208/114 HR 90 RR16

## 2017-12-07 NOTE — ED Provider Notes (Signed)
[facility] EMERGENCY DEPARTMENT Provider Note  CSN: [id number] Arrival date & time: 12/07/17  1818    History   Chief Complaint No chief complaint on file.   HPI Carrie [name] is a 36 y.o. female with a medical history of ESRD on HD and Type 1 DM complicated by neuropathy and retinopathy who presented to the ED for bleeding at her HD site following dialysis treatment. She is unsure how long the bleeding lasted, but states it was "shooting" out. She states that the amount of blood soaked one gauze pad. Denies color or temperature change in left extremity, paresthesias, weakness, lightheadedness, dizziness. Bleeding controlled prior to arrival. She is not on an anticoagulant. Patient reports post-treatment bleeding earlier this week as well.   Past Medical History:  Diagnosis Date  . Anemia   . Diabetic neuropathy ([place])   . Diabetic retinopathy ([place])   . ESRD (end stage renal disease) on dialysis [facility])    "MWF; [place]" (02/02/2017)  . Gastroparesis   . HA (headache)    "qd if I didn't take my RX" (02/02/2017)  . Hemodialysis access site with arteriovenous graft ([place])   . Hypertension   . Neuropathy   . Renal insufficiency   . Trigeminal neuralgia   . Type I diabetes mellitus [facility])     Patient Active Problem List   Diagnosis Date Noted  . Acute cystitis without hematuria 10/09/2017  . Hypertensive urgency 10/09/2017  . Nausea & vomiting 10/08/2017  . Chest pain 05/04/2017  . ESRD (end stage renal disease) ([place]) 02/01/2017  . 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 Pregnancy test positive 12/22/2016  . Amenorrhea 12/12/2016  . Diabetes ([place]) 09/16/2016  . ESRD on hemodialysis ([place])   . Gastroparesis   . Intractable vomiting 09/04/2016  . CKD stage 5 due to type 1 diabetes mellitus ([place]) 09/04/2016  . Intractable vomiting with nausea 04/04/2015  . AKI (acute kidney injury) ([place]) 04/04/2015  . DKA (diabetic ketoacidoses) ([place]) 04/03/2015  . Trigeminal neuralgia  12/12/2013  . HA (headache)   . DKA, type 2 ([place]) 10/24/2012  . Contact dermatitis 10/24/2012  . SORE THROAT 03/22/2010  . 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 FATIGUE 05/25/2009  . VAGINAL DISCHARGE 01/28/2009  . Carbuncle and furuncle of unspecified site 12/29/2008  . HYPERCHOLESTEROLEMIA 06/12/2008  . MICROALBUMINURIA 06/12/2008  . MONILIAL VAGINITIS 11/14/2007  . INGUINAL PAIN, BILATERAL 11/14/2007  . DYSURIA 10/17/2007  . CYSTITIS 09/05/2007  . DELAYED MENSES 09/05/2007  . RHINOCONJUNCTIVITIS, ALLERGIC 07/19/2007  . DENTAL PAIN 03/28/2007  . HYPERLIPIDEMIA 09/01/2006  . PELVIC INFLAMMATORY DISEASE 09/01/2006  . ABORTION, SPONTANEOUS 09/01/2006  . ABSCESS 09/01/2006  . HX, PERSONAL, PAST NONCOMPLIANCE 09/01/2006    Past Surgical History:  Procedure Laterality Date  . AV FISTULA PLACEMENT Left 09/07/2016   Procedure: Left arm 1st Stage Basilic AV Fistula;  Surgeon: [name], MD;  Location: [facility];  Service: Vascular;  Laterality: Left;  . DILATION AND CURETTAGE OF UTERUS    . EXCISIONAL HEMORRHOIDECTOMY    . EYE SURGERY    . INSERTION OF DIALYSIS CATHETER Right 09/07/2016   Procedure: INSERTION OF DIALYSIS CATHETER;  Surgeon: [name], MD;  Location: [facility] OR;  Service: Vascular;  Laterality: Right;  . RETINAL LASER PROCEDURE Bilateral    "related to diabetes"  . REVISON OF ARTERIOVENOUS FISTULA Left 01/15/2017   Procedure: INSERTION OF  AV GORE-TEX GRAFT;  Surgeon: Elam Dutch, MD;  Location: [facility];  Service: Vascular;  Laterality: Left;  .  TEE WITHOUT CARDIOVERSION N/A 01/11/2017   Procedure: TRANSESOPHAGEAL ECHOCARDIOGRAM (TEE);  Surgeon: Jerline Pain, MD;  Location: [facility] ENDOSCOPY;  Service: Cardiovascular;  Laterality: N/A;     OB History    Gravida  7   Para  1   Term      Preterm  1   AB  5   Living  1     SAB  2   TAB  3   Ectopic      Multiple      Live Births               Home Medications    Prior to Admission medications     Medication Sig Start Date End Date Taking? Authorizing Provider  acetaminophen (TYLENOL) 500 MG tablet Take 1,000 mg by mouth every Monday, Wednesday, and Friday with hemodialysis.    [provider]  amLODipine (NORVASC) 10 MG tablet Take 10 mg by mouth at bedtime. 10/01/17   [provider]  carbamazepine (TEGRETOL) 200 MG tablet Take 1 tablet (200 mg total) by mouth at bedtime. 05/07/17   Annita Brod, MD  diphenhydrAMINE (BENADRYL) 25 mg capsule Take 50 mg by mouth every Monday, Wednesday, and Friday with hemodialysis.    [provider]  escitalopram (LEXAPRO) 10 MG tablet Take 10 mg by mouth daily. 09/22/17   [provider]  ferric citrate (AURYXIA) 1 GM 210 MG(Fe) tablet Take 210 mg by mouth 3 (three) times daily with meals.    [provider]  gabapentin (NEURONTIN) 300 MG capsule Take 3 capsules (900 mg total) by mouth at bedtime. 10/10/17   Hongalgi, Lenis Dickinson, MD  insulin aspart protamine - aspart (NOVOLOG MIX 70/30 FLEXPEN) (70-30) 100 UNIT/ML FlexPen Inject 0.15-0.25 mLs (15-25 Units total) into the skin See admin instructions. On non-dialysis days inj 25 units in the morning and 15 units at night. On dialysis days inj 15 units in the morning and 15 units at night. 10/10/17   Hongalgi, Lenis Dickinson, MD  labetalol (NORMODYNE) 300 MG tablet Take 300 mg by mouth See admin instructions. Take 300mg  tablet at bedtime on diaylsis days M, W, F. Take 300mg  tablet twice daily on all other days 08/25/16   [provider]  LYRICA 75 MG capsule Take 75 mg by mouth at bedtime. 10/01/17   [provider]  metoCLOPramide (REGLAN) 10 MG tablet Take 10 mg by mouth 3 (three) times daily with meals.     [provider]  ondansetron (ZOFRAN) 4 MG tablet Take 1 tablet (4 mg total) by mouth every 8 (eight) hours as needed for nausea or vomiting. 09/12/16   Rosita Fire, MD  promethazine (PHENERGAN) 25 MG tablet Take 1 tablet (25 mg total)  by mouth every 8 (eight) hours as needed for nausea or vomiting. 09/12/16   Rosita Fire, MD  rosuvastatin (CRESTOR) 10 MG tablet Take 10 mg by mouth at bedtime. 03/30/17   [provider]    Family History Family History  Problem Relation Age of Onset  . Hyperlipidemia Mother   . Asthma Daughter   . Diabetes Maternal Grandmother   . Stroke Maternal Grandmother   . Kidney disease Maternal Grandfather   . Kidney disease Paternal Grandmother     Social History Social History   Tobacco Use  . Smoking status: Never Smoker  . Smokeless tobacco: Never Used  Substance Use Topics  . Alcohol use: Yes    Alcohol/week: 4.0 standard drinks  Types: 4 Standard drinks or equivalent per week    Comment: 02/02/2017 "nothing in the last 6 months; never had problem w/it"  . Drug use: No     Allergies   Patient has no known allergies.   Review of Systems Review of Systems  Constitutional: Negative for chills, fatigue and fever.  Respiratory: Negative.   Cardiovascular: Negative.   Gastrointestinal: Negative.   Genitourinary: Negative.   Musculoskeletal: Negative.   Skin: Negative for pallor.  Neurological: Negative.  Negative for dizziness, light-headedness and numbness.  Hematological: Bruises/bleeds easily.     Physical Exam Updated Vital Signs BP (!) 201/87   Pulse 92   Temp 99 F (37.2 C) (Oral)   Resp 18   LMP 07/17/2016 Comment: Has not had a period since starting dialysis in May  SpO2 97%   Physical Exam  Constitutional: No distress.  Sitting comfortably on bed.  Cardiovascular: Normal rate, regular rhythm and normal heart sounds.  Pulses:      Radial pulses are 2+ on the right side, and 2+ on the left side.  Appropriate thrill auscultated over left AV fistula site.  Pulmonary/Chest: Effort normal and breath sounds normal.  Abdominal: Soft. Bowel sounds are normal.  Skin: She is not diaphoretic.  AV site in left forearm is not actively  bleeding. No erythema, drainage or warmth.  Nursing note and vitals reviewed.  ED Treatments / Results  Labs (all labs ordered are listed, but only abnormal results are displayed) Labs Reviewed - No data to display  EKG None  Radiology No results found.  Procedures Procedures (including critical care time)  Medications Ordered in ED Medications - No data to display   Initial Impression / Assessment and Plan / ED Course  Triage vital signs and the nursing notes have been reviewed.  Pertinent labs & imaging results that were available during care of the patient were reviewed and considered in medical decision making (see chart for details).  Patient presents afebrile with complaints of AV site bleeding following her dialysis treatment. Bleeding was controlled prior to arrival. She reports a prior episode of bleeding following a treatment earlier this week. Patient states that [facility] performed her AV graft, but has not seen them since then. Patient is currently well appearing and is not in distress which is reassuring. The AV site looks good and is not showing any signs of infection. Patient advised to continue to monitor for post-HD issues with the site. Encouraged to discuss this with her nephrologist or vein provider if it continues to be an issue. Clinical Course as of Dec 08 257  Fri Dec 07, 2017  2055 BP elevated which is expected in HD patient. She reports that her BP is frequently >180/100. Denies chest pain, SOB, vision changes or headache.   [GM]    Clinical Course User Index [GM] Mortis, Jonelle Sports, PA-C     Final Clinical Impressions(s) / ED Diagnoses   Dispo: Home. After thorough clinical evaluation, this patient is determined to be medically stable and can be safely discharged with the previously mentioned treatment and/or outpatient follow-up/referral(s). At this time, there are no other apparent medical conditions that require further screening,  evaluation or treatment.   Final diagnoses:  Bleeding    ED Discharge Orders    None        Romie Jumper, [place] 12/08/17 [id number]    Fredia Sorrow, MD 12/16/17 [phone]

## 2017-12-07 NOTE — ED Notes (Signed)
Pt called out for "bandage feeling warm and wet." Unwrapped coban dressing to find soaked gauze. Left in place and reinforced with additional 4x4 gauze pads. Rewrapped with coban.

## 2017-12-07 NOTE — ED Notes (Signed)
Reviewed d/c instructions with pt, who verbalized understanding and had no outstanding questions. Pt departed in NAD, refused use of wheelchair.   

## 2017-12-07 NOTE — ED Notes (Signed)
Pt called out c.o neuropathy pain in feet and legs, EDP made aware

## 2017-12-07 NOTE — Discharge Instructions (Addendum)
I recommend that you follow-up with your vascular provider or PCP to discuss issues that you have been having with your fistula site.  Follow-up with a medical provider sooner if: bleeding occurs spontaneously, blood loss from site causes paleness, tingling, lightheaded/dizziness.

## 2017-12-13 NOTE — Telephone Encounter (Signed)
Received signed domestic return receipt verifying delivery of certified letter on December 05, 2017. Article number [id number]

## 2018-01-08 ENCOUNTER — Ambulatory Visit: Payer: BLUE CROSS/BLUE SHIELD | Admitting: Endocrinology

## 2018-01-30 ENCOUNTER — Encounter: Payer: Self-pay | Admitting: Neurology

## 2018-04-08 NOTE — Progress Notes (Signed)
NEUROLOGY CONSULTATION NOTE  Carrie [name] MRN: [id number] DOB: 07/25/1980  Referring provider: Valentina Gu, NP Primary care provider: Glendon Axe, MD  Reason for consult:  neuropathy  HISTORY OF PRESENT ILLNESS: Carrie [name] is a 37 year old woman with type 2 diabetes mellitus with polyneuropathy and chronic kidney disease on dialysis and hyperprolactinemia who presents for neuropathy.  History supplemented by referring providers note.  She has history of neuropathy secondary to type 2 diabetes mellitus since 2014.  It started in the toes and has progressed up to the knees.  She describes sharp pains and nagging throbbing.  Sometimes the feet become numb and may cause her to fall.  She does feel unsteady when she is in the shower.  She fell once this year down the steps.  She was diagnosed with diabetes at age 38.  Other complications include gastroparesis and ESRD on dialysis.    Most recent available Hgb A1c from 11/06/17 was 8.6.  Current medications: Carbamazepine 200mg  at bedtime (for trigeminal neuralgia) Gabapentin 900mg  at bedtime (three times daily was not more effective) Lyrica 75mg  at bedtime   Past medications: Amitriptyline (effective but contraindicated with her other medications)  PAST MEDICAL HISTORY: Past Medical History:  Diagnosis Date  . Anemia   . Diabetic neuropathy ([place])   . Diabetic retinopathy ([place])   . ESRD (end stage renal disease) on dialysis [facility])    "MWF; [place]" (02/02/2017)  . Gastroparesis   . HA (headache)    "qd if I didn't take my RX" (02/02/2017)  . Hemodialysis access site with arteriovenous graft ([place])   . Hypertension   . Neuropathy   . Renal insufficiency   . Trigeminal neuralgia   . Type I diabetes mellitus ([place])     PAST SURGICAL HISTORY: Past Surgical History:  Procedure Laterality Date  . AV FISTULA PLACEMENT Left 09/07/2016   Procedure: Left arm 1st Stage Basilic AV Fistula;  Surgeon: [name], MD;   Location: [facility] OR;  Service: Vascular;  Laterality: Left;  . DILATION AND CURETTAGE OF UTERUS    . EXCISIONAL HEMORRHOIDECTOMY    . EYE SURGERY    . INSERTION OF DIALYSIS CATHETER Right 09/07/2016   Procedure: INSERTION OF DIALYSIS CATHETER;  Surgeon: [name], MD;  Location: [facility] OR;  Service: Vascular;  Laterality: Right;  . RETINAL LASER PROCEDURE Bilateral    "related to diabetes"  . REVISON OF ARTERIOVENOUS FISTULA Left 01/15/2017   Procedure: INSERTION OF  AV GORE-TEX GRAFT;  Surgeon: Elam Dutch, MD;  Location: [facility];  Service: Vascular;  Laterality: Left;  . TEE WITHOUT CARDIOVERSION N/A 01/11/2017   Procedure: TRANSESOPHAGEAL ECHOCARDIOGRAM (TEE);  Surgeon: Jerline Pain, MD;  Location: [facility] ENDOSCOPY;  Service: Cardiovascular;  Laterality: N/A;    MEDICATIONS: Current Outpatient Medications on File Prior to Visit  Medication Sig Dispense Refill  . acetaminophen (TYLENOL) 500 MG tablet Take 1,000 mg by mouth every Monday, Wednesday, and Friday with hemodialysis.    Marland Kitchen amLODipine (NORVASC) 10 MG tablet Take 10 mg by mouth at bedtime.  3  . carbamazepine (TEGRETOL) 200 MG tablet Take 1 tablet (200 mg total) by mouth at bedtime.  1  . diphenhydrAMINE (BENADRYL) 25 mg capsule Take 50 mg by mouth every Monday, Wednesday, and Friday with hemodialysis.    Marland Kitchen escitalopram (LEXAPRO) 10 MG tablet Take 10 mg by mouth daily.  5  . ferric citrate (AURYXIA) 1 GM 210 MG(Fe) tablet Take 210 mg by mouth 3 (three) times  daily with meals.    . gabapentin (NEURONTIN) 300 MG capsule Take 3 capsules (900 mg total) by mouth at bedtime.    . insulin aspart protamine - aspart (NOVOLOG MIX 70/30 FLEXPEN) (70-30) 100 UNIT/ML FlexPen Inject 0.15-0.25 mLs (15-25 Units total) into the skin See admin instructions. On non-dialysis days inj 25 units in the morning and 15 units at night. On dialysis days inj 15 units in the morning and 15 units at night.    . labetalol (NORMODYNE) 300 MG tablet Take 300 mg by  mouth See admin instructions. Take 300mg  tablet at bedtime on diaylsis days M, W, F. Take 300mg  tablet twice daily on all other days  0  . LYRICA 75 MG capsule Take 75 mg by mouth at bedtime.  5  . metoCLOPramide (REGLAN) 10 MG tablet Take 10 mg by mouth 3 (three) times daily with meals.     . ondansetron (ZOFRAN) 4 MG tablet Take 1 tablet (4 mg total) by mouth every 8 (eight) hours as needed for nausea or vomiting. 20 tablet 0  . promethazine (PHENERGAN) 25 MG tablet Take 1 tablet (25 mg total) by mouth every 8 (eight) hours as needed for nausea or vomiting. 10 tablet 0  . rosuvastatin (CRESTOR) 10 MG tablet Take 10 mg by mouth at bedtime.  1   No current facility-administered medications on file prior to visit.     ALLERGIES: No Known Allergies  FAMILY HISTORY: Family History  Problem Relation Age of Onset  . Hyperlipidemia Mother   . Asthma Daughter   . Diabetes Maternal Grandmother   . Stroke Maternal Grandmother   . Kidney disease Maternal Grandfather   . Kidney disease Paternal Grandmother    SOCIAL HISTORY: Social History   Socioeconomic History  . Marital status: Single    Spouse name: Not on file  . Number of children: 1  . Years of education: 59  . Highest education level: Not on file  Occupational History  . Occupation: Call Center    Employer: [employer]  . Financial resource strain: Not on file  . Food insecurity:    Worry: Not on file    Inability: Not on file  . Transportation needs:    Medical: Not on file    Non-medical: Not on file  Tobacco Use  . Smoking status: Never Smoker  . Smokeless tobacco: Never Used  Substance and Sexual Activity  . Alcohol use: Yes    Alcohol/week: 4.0 standard drinks    Types: 4 Standard drinks or equivalent per week    Comment: 02/02/2017 "nothing in the last 6 months; never had problem w/it"  . Drug use: No  . Sexual activity: Yes  Lifestyle  . Physical activity:    Days per week: Not on file      Minutes per session: Not on file  . Stress: Not on file  Relationships  . Social connections:    Talks on phone: Not on file    Gets together: Not on file    Attends religious service: Not on file    Active member of club or organization: Not on file    Attends meetings of clubs or organizations: Not on file    Relationship status: Not on file  . Intimate partner violence:    Fear of current or ex partner: Not on file    Emotionally abused: Not on file    Physically abused: Not on file    Forced sexual activity:  Not on file  Other Topics Concern  . Not on file  Social History Narrative   Patient lives at home with her father and daughter.    Education some college.   Right handed   Caffeine Five sodas daily.          REVIEW OF SYSTEMS: Constitutional: No fevers, chills, or sweats, no generalized fatigue, change in appetite Eyes: No visual changes, double vision, eye pain Ear, nose and throat: No hearing loss, ear pain, nasal congestion, sore throat Cardiovascular: No chest pain, palpitations Respiratory:  No shortness of breath at rest or with exertion, wheezes GastrointestinaI: No nausea, vomiting, diarrhea, abdominal pain, fecal incontinence Genitourinary:  No dysuria, urinary retention or frequency Musculoskeletal:  No neck pain, back pain Integumentary: No rash, pruritus, skin lesions Neurological: as above Psychiatric: No depression, insomnia, anxiety Endocrine: No palpitations, fatigue, diaphoresis, mood swings, change in appetite, change in weight, increased thirst Hematologic/Lymphatic:  No purpura, petechiae. Allergic/Immunologic: no itchy/runny eyes, nasal congestion, recent allergic reactions, rashes  PHYSICAL EXAM: Blood pressure (!) 162/90, pulse 82, height 5\' 8"  (1.727 m), weight 193 lb (87.5 kg), SpO2 95 %. General: No acute distress.  Patient appears well-groomed.  Head:  Normocephalic/atraumatic Eyes:  fundi examined but not visualized Neck: supple,  no paraspinal tenderness, full range of motion Back: No paraspinal tenderness Heart: regular rate and rhythm Lungs: Clear to auscultation bilaterally. Vascular: No carotid bruits. Neurological Exam: Mental status: alert and oriented to person, place, and time, recent and remote memory intact, fund of knowledge intact, attention and concentration intact, speech fluent and not dysarthric, language intact. Cranial nerves: CN I: not tested CN II: pupils equal, round and reactive to light, visual fields intact CN III, IV, VI:  full range of motion, no nystagmus, no ptosis CN V: facial sensation intact CN VII: upper and lower face symmetric CN VIII: hearing intact CN IX, X: gag intact, uvula midline CN XI: sternocleidomastoid and trapezius muscles intact CN XII: tongue midline Bulk & Tone: normal, no fasciculations. Motor:  5/5 throughout  Sensation:  Pinprick sensation reduced up to mid shin and vibration sensation reduced up to below the knees. Deep Tendon Reflexes: 1+ throughout except absent in the ankles, toes downgoing Finger to nose testing:  Without dysmetria.   Heel to shin:  Without dysmetria.   Gait:  Normal station and stride.  Able to turn and tandem walk. Romberg negative.  IMPRESSION: Diabetic polyneuropathy Hypertension  PLAN: 1.  We will increase Lyrica to 75 mg twice daily.  If pain not improved in 6 weeks, she is to contact us and we can increase the dose to 75 mg in the morning and 150 mg at bedtime.  No change to her other medications at this time. 2.  Blood pressure should be readdressed and rechecked with her PCP. 3.  Follow-up in 3 to 4 months.  Thank you for allowing me to take part in the care of this patient.  Metta Clines, DO  CC:  Glendon Axe, MD  Juanell Fairly, NP

## 2018-04-09 ENCOUNTER — Other Ambulatory Visit: Payer: Self-pay

## 2018-04-09 ENCOUNTER — Ambulatory Visit (INDEPENDENT_AMBULATORY_CARE_PROVIDER_SITE_OTHER): Payer: BLUE CROSS/BLUE SHIELD | Admitting: Neurology

## 2018-04-09 ENCOUNTER — Encounter: Payer: Self-pay | Admitting: Neurology

## 2018-04-09 VITALS — BP 162/90 | HR 82 | Ht 68.0 in | Wt 193.0 lb

## 2018-04-09 DIAGNOSIS — E1142 Type 2 diabetes mellitus with diabetic polyneuropathy: Secondary | ICD-10-CM

## 2018-04-09 MED ORDER — PREGABALIN 75 MG PO CAPS: 75.0000 mg | ORAL_CAPSULE | Freq: Two times a day (BID) | ORAL | 3 refills | Status: DC

## 2018-04-09 NOTE — Patient Instructions (Signed)
1.  Increase Lyrica to 75mg  twice daily.  If pain not improved in 6 weeks, contact me and we can increase dose. 2.  Follow up in 3 to 4 months

## 2018-04-26 ENCOUNTER — Encounter (HOSPITAL_COMMUNITY): Payer: Self-pay | Admitting: Emergency Medicine

## 2018-04-26 ENCOUNTER — Emergency Department (HOSPITAL_COMMUNITY)
Admission: EM | Admit: 2018-04-26 | Discharge: 2018-04-26 | Disposition: A | Discharge status: Home / Self Care | Payer: BLUE CROSS/BLUE SHIELD | Attending: Emergency Medicine | Admitting: Emergency Medicine

## 2018-04-26 DIAGNOSIS — E7800 Pure hypercholesterolemia, unspecified: Secondary | ICD-10-CM | POA: Insufficient documentation

## 2018-04-26 DIAGNOSIS — Z794 Long term (current) use of insulin: Secondary | ICD-10-CM | POA: Diagnosis not present

## 2018-04-26 DIAGNOSIS — Z992 Dependence on renal dialysis: Secondary | ICD-10-CM

## 2018-04-26 DIAGNOSIS — T82838A Hemorrhage of vascular prosthetic devices, implants and grafts, initial encounter: Secondary | ICD-10-CM

## 2018-04-26 DIAGNOSIS — N186 End stage renal disease: Secondary | ICD-10-CM | POA: Diagnosis not present

## 2018-04-26 DIAGNOSIS — I120 Hypertensive chronic kidney disease with stage 5 chronic kidney disease or end stage renal disease: Secondary | ICD-10-CM | POA: Insufficient documentation

## 2018-04-26 DIAGNOSIS — T8249XA Other complication of vascular dialysis catheter, initial encounter: Secondary | ICD-10-CM | POA: Diagnosis present

## 2018-04-26 DIAGNOSIS — Z79899 Other long term (current) drug therapy: Secondary | ICD-10-CM | POA: Diagnosis not present

## 2018-04-26 DIAGNOSIS — E1040 Type 1 diabetes mellitus with diabetic neuropathy, unspecified: Secondary | ICD-10-CM | POA: Diagnosis not present

## 2018-04-26 DIAGNOSIS — Y829 Unspecified medical devices associated with adverse incidents: Secondary | ICD-10-CM | POA: Diagnosis not present

## 2018-04-26 LAB — PROTIME-INR
INR: 1.06
PROTHROMBIN TIME: 13.7 s (ref 11.4–15.2)

## 2018-04-26 LAB — BASIC METABOLIC PANEL
ANION GAP: 10 (ref 5–15)
BUN: 10 mg/dL (ref 6–20)
CALCIUM: 8.6 mg/dL — AB (ref 8.9–10.3)
CO2: 35 mmol/L — AB (ref 22–32)
CREATININE: 4.11 mg/dL — AB (ref 0.44–1.00)
Chloride: 91 mmol/L — ABNORMAL LOW (ref 98–111)
GFR, EST AFRICAN AMERICAN: 15 mL/min — AB (ref >60)
GFR, EST NON AFRICAN AMERICAN: 13 mL/min — AB (ref >60)
Glucose, Bld: 148 mg/dL — ABNORMAL HIGH (ref 70–99)
Potassium: 3.1 mmol/L — ABNORMAL LOW (ref 3.5–5.1)
SODIUM: 136 mmol/L (ref 135–145)

## 2018-04-26 LAB — CBC WITH DIFFERENTIAL/PLATELET
Abs Immature Granulocytes: 0.02 10*3/uL (ref 0.00–0.07)
BASOS ABS: 0.1 10*3/uL (ref 0.0–0.1)
Basophils Relative: 1 %
Eosinophils Absolute: 0.1 10*3/uL (ref 0.0–0.5)
Eosinophils Relative: 3 %
HEMATOCRIT: 31.9 % — AB (ref 36.0–46.0)
Hemoglobin: 10 g/dL — ABNORMAL LOW (ref 12.0–15.0)
IMMATURE GRANULOCYTES: 1 %
LYMPHS ABS: 1 10*3/uL (ref 0.7–4.0)
Lymphocytes Relative: 28 %
MCH: 29.6 pg (ref 26.0–34.0)
MCHC: 31.3 g/dL (ref 30.0–36.0)
MCV: 94.4 fL (ref 80.0–100.0)
Monocytes Absolute: 0.4 10*3/uL (ref 0.1–1.0)
Monocytes Relative: 9 %
NEUTROS PCT: 58 %
NRBC: 0 % (ref 0.0–0.2)
Neutro Abs: 2.1 10*3/uL (ref 1.7–7.7)
PLATELETS: 220 10*3/uL (ref 150–400)
RBC: 3.38 MIL/uL — AB (ref 3.87–5.11)
RDW: 17.2 % — AB (ref 11.5–15.5)
WBC: 3.7 10*3/uL — AB (ref 4.0–10.5)

## 2018-04-26 MED ORDER — LABETALOL HCL 200 MG PO TABS
300.0000 mg | ORAL_TABLET | ORAL | Status: AC
  Administered 2018-04-26: 300 mg via ORAL
  Filled 2018-04-26: qty 2

## 2018-04-26 NOTE — ED Provider Notes (Signed)
[facility] EMERGENCY DEPARTMENT Provider Note   CSN: [id number] Arrival date & time: 04/26/18  1614     History   Chief Complaint Chief Complaint  Patient presents with  . Vascular Access Problem    HPI Carrie [name] is a 38 y.o. female.  HPI   Patient is a 38 year old female with a past medical history of diabetes, diabetic neuropathy, ESRD currently getting dialysis Monday Wednesday Friday with no recently missed sessions as well as hypertension having missed her morning blood pressure medicines today who presents via EMS from her dialysis clinic for evaluation of bleeding from her AV fistula site.  Per EMS that arterial needle was still in place and the bleeding was able to be controlled with 50 minutes of pressure after that the patient bled through 7 shocks.  Patient states she has had intermittent bleeding over the past week.  She states she gets heparin during her dialysis sessions or should not take any blood thinner medications outpatient.  She states she has otherwise been well without any recent fevers, headache, chest pain, cough, shortness of breath, vomiting, diarrhea, dysuria, blood in her stool, blood in her urine, or other acute complaints.  Denies prior similar episodes.  Denies recent traumatic injuries.  Denies any alleviating aggravating factors.  Past Medical History:  Diagnosis Date  . Anemia   . Diabetic neuropathy ([place])   . Diabetic retinopathy ([place])   . ESRD (end stage renal disease) on dialysis [facility])    "MWF; [place]" (02/02/2017)  . Gastroparesis   . HA (headache)    "qd if I didn't take my RX" (02/02/2017)  . Hemodialysis access site with arteriovenous graft ([place])   . Hypertension   . Neuropathy   . Renal insufficiency   . Trigeminal neuralgia   . Type I diabetes mellitus [facility])     Patient Active Problem List   Diagnosis Date Noted  . Acute cystitis without hematuria 10/09/2017  . Hypertensive urgency 10/09/2017  . Nausea &  vomiting 10/08/2017  . Chest pain 05/04/2017  . ESRD (end stage renal disease) ([place]) 02/01/2017  . 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 Pregnancy test positive 12/22/2016  . Amenorrhea 12/12/2016  . Diabetes ([place]) 09/16/2016  . ESRD on hemodialysis ([place])   . Gastroparesis   . Intractable vomiting 09/04/2016  . 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 Intractable vomiting with nausea 04/04/2015  . AKI (acute kidney injury) ([place]) 04/04/2015  . DKA (diabetic ketoacidoses) ([place]) 04/03/2015  . Trigeminal neuralgia 12/12/2013  . HA (headache)   . DKA, type 2 ([place]) 10/24/2012  . Contact dermatitis 10/24/2012  . SORE THROAT 03/22/2010  . 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 FATIGUE 05/25/2009  . VAGINAL DISCHARGE 01/28/2009  . Carbuncle and furuncle of unspecified site 12/29/2008  . HYPERCHOLESTEROLEMIA 06/12/2008  . MICROALBUMINURIA 06/12/2008  . MONILIAL VAGINITIS 11/14/2007  . INGUINAL PAIN, BILATERAL 11/14/2007  . DYSURIA 10/17/2007  . CYSTITIS 09/05/2007  . DELAYED MENSES 09/05/2007  . RHINOCONJUNCTIVITIS, ALLERGIC 07/19/2007  . DENTAL PAIN 03/28/2007  . HYPERLIPIDEMIA 09/01/2006  . PELVIC INFLAMMATORY DISEASE 09/01/2006  . ABORTION, SPONTANEOUS 09/01/2006  . ABSCESS 09/01/2006  . HX, PERSONAL, PAST NONCOMPLIANCE 09/01/2006    Past Surgical History:  Procedure Laterality Date  . AV FISTULA PLACEMENT Left 09/07/2016   Procedure: Left arm 1st Stage Basilic AV Fistula;  Surgeon: [name], MD;  Location: [facility] OR;  Service: Vascular;  Laterality: Left;  . DILATION AND CURETTAGE OF  UTERUS    . EXCISIONAL HEMORRHOIDECTOMY    . EYE SURGERY    . INSERTION OF DIALYSIS CATHETER Right 09/07/2016   Procedure: INSERTION OF DIALYSIS CATHETER;  Surgeon: [name], MD;  Location: [facility] OR;  Service: Vascular;  Laterality: Right;  . RETINAL LASER PROCEDURE Bilateral    "related to diabetes"  . REVISON OF ARTERIOVENOUS FISTULA Left 01/15/2017   Procedure: INSERTION OF  AV  GORE-TEX GRAFT;  Surgeon: Elam Dutch, MD;  Location: [facility];  Service: Vascular;  Laterality: Left;  . TEE WITHOUT CARDIOVERSION N/A 01/11/2017   Procedure: TRANSESOPHAGEAL ECHOCARDIOGRAM (TEE);  Surgeon: Jerline Pain, MD;  [facility]COPY;  Service: Cardiovascular;  Laterality: N/A;     OB History    Gravida  7   Para  1   Term      Preterm  1   AB  5   Living  1     SAB  2   TAB  3   Ectopic      Multiple      Live Births               Home Medications    Prior to Admission medications   Medication Sig Start Date End Date Taking? Authorizing Provider  acetaminophen (TYLENOL) 500 MG tablet Take 1,000 mg by mouth every Monday, Wednesday, and Friday with hemodialysis.   Yes [provider]  amLODipine (NORVASC) 10 MG tablet Take 10 mg by mouth at bedtime. 10/01/17  Yes [provider]  carbamazepine (TEGRETOL) 200 MG tablet Take 1 tablet (200 mg total) by mouth at bedtime. 05/07/17  Yes Annita Brod, MD  ferric citrate (AURYXIA) 1 GM 210 MG(Fe) tablet Take 210 mg by mouth 3 (three) times daily with meals.   Yes [provider]  gabapentin (NEURONTIN) 300 MG capsule Take 3 capsules (900 mg total) by mouth at bedtime. Patient taking differently: Take 300 mg by mouth at bedtime.  10/10/17  Yes Hongalgi, Lenis Dickinson, MD  insulin aspart protamine - aspart (NOVOLOG MIX 70/30 FLEXPEN) (70-30) 100 UNIT/ML FlexPen Inject 0.15-0.25 mLs (15-25 Units total) into the skin See admin instructions. On non-dialysis days inj 25 units in the morning and 15 units at night. On dialysis days inj 15 units in the morning and 15 units at night. 10/10/17  Yes Hongalgi, Lenis Dickinson, MD  labetalol (NORMODYNE) 300 MG tablet Take 300 mg by mouth See admin instructions. Take 300mg  tablet at bedtime on diaylsis days M, W, F. Take 300mg  tablet twice daily on all other days 08/25/16  Yes [provider]  lisinopril (PRINIVIL,ZESTRIL) 20 MG tablet Take 20 mg by  mouth daily.   Yes [provider]  metoCLOPramide (REGLAN) 10 MG tablet Take 10 mg by mouth 3 (three) times daily with meals.    Yes [provider]  ondansetron (ZOFRAN) 4 MG tablet Take 1 tablet (4 mg total) by mouth every 8 (eight) hours as needed for nausea or vomiting. 09/12/16  Yes Rosita Fire, MD  pregabalin (LYRICA) 75 MG capsule Take 1 capsule (75 mg total) by mouth 2 (two) times daily. 04/09/18  Yes Tomi Likens, Adam R, DO  promethazine (PHENERGAN) 25 MG tablet Take 1 tablet (25 mg total) by mouth every 8 (eight) hours as needed for nausea or vomiting. 09/12/16  Yes Rosita Fire, MD    Family History Family History  Problem Relation Age of Onset  . Hyperlipidemia Mother   . Asthma Daughter   .  Diabetes Maternal Grandmother   . Stroke Maternal Grandmother   . Kidney disease Maternal Grandfather   . Kidney disease Paternal Grandmother     Social History Social History   Tobacco Use  . Smoking status: Never Smoker  . Smokeless tobacco: Never Used  Substance Use Topics  . Alcohol use: Yes    Alcohol/week: 4.0 standard drinks    Types: 4 Standard drinks or equivalent per week    Comment: 02/02/2017 "nothing in the last 6 months; never had problem w/it"  . Drug use: No     Allergies   Patient has no known allergies.   Review of Systems Review of Systems  Constitutional: Negative for chills and fever.  HENT: Negative for ear pain and sore throat.   Eyes: Negative for pain and visual disturbance.  Respiratory: Negative for cough and shortness of breath.   Cardiovascular: Negative for chest pain and palpitations.  Gastrointestinal: Negative for abdominal pain and vomiting.  Genitourinary: Negative for dysuria and hematuria.  Musculoskeletal: Negative for arthralgias and back pain.  Skin: Negative for color change and rash.  Neurological: Negative for seizures and syncope.  Hematological: Bruises/bleeds easily.  All other systems  reviewed and are negative.    Physical Exam Updated Vital Signs BP (!) 194/96   Pulse 80   Temp 99 F (37.2 C) (Oral)   Resp 17   SpO2 96%   Physical Exam Vitals signs and nursing note reviewed.  Constitutional:      General: She is not in acute distress.    Appearance: She is well-developed.  HENT:     Head: Normocephalic and atraumatic.     Right Ear: External ear normal.     Left Ear: External ear normal.     Nose: Nose normal.     Mouth/Throat:     Mouth: Mucous membranes are moist.  Eyes:     Conjunctiva/sclera: Conjunctivae normal.  Neck:     Musculoskeletal: Neck supple.  Cardiovascular:     Rate and Rhythm: Normal rate and regular rhythm.     Pulses: Normal pulses.     Heart sounds: No murmur.  Pulmonary:     Effort: Pulmonary effort is normal. No respiratory distress.     Breath sounds: Normal breath sounds.  Abdominal:     Palpations: Abdomen is soft.     Tenderness: There is no abdominal tenderness.  Skin:    General: Skin is warm and dry.     Capillary Refill: Capillary refill takes less than 2 seconds.  Neurological:     General: No focal deficit present.     Mental Status: She is alert.    Patient's AV fistula is visualized in her left upper extremity.  There is a needle in place on the arterial side that is held in place with 2 pieces of tape and has no active bleeding.  Patient has a 2+ radial pulse and is otherwise neurovascularly intact in her left upper extremity.  Exam otherwise unremarkable. ED Treatments / Results  Labs (all labs ordered are listed, but only abnormal results are displayed) Labs Reviewed  CBC WITH DIFFERENTIAL/PLATELET - Abnormal; Notable for the following components:      Result Value   WBC 3.7 (*)    RBC 3.38 (*)    Hemoglobin 10.0 (*)    HCT 31.9 (*)    RDW 17.2 (*)    All other components within normal limits  BASIC METABOLIC PANEL - Abnormal; Notable for the following components:  Potassium 3.1 (*)    Chloride  91 (*)    CO2 35 (*)    Glucose, Bld 148 (*)    Creatinine, Ser 4.11 (*)    Calcium 8.6 (*)    GFR calc non Af Amer 13 (*)    GFR calc Af Amer 15 (*)    All other components within normal limits  PROTIME-INR    EKG None  Radiology No results found.  Procedures Procedures (including critical care time)  Medications Ordered in ED Medications  labetalol (NORMODYNE) tablet 300 mg (300 mg Oral Given 04/26/18 1635)     Initial Impression / Assessment and Plan / ED Course  I have reviewed the triage vital signs and the nursing notes.  Pertinent labs & imaging results that were available during my care of the patient were reviewed by me and considered in my medical decision making (see chart for details).     Patient is a 38 year old female who presents with above-stated history exam.  On presentation patient is afebrile with a blood pressure of 192/108 with otherwise stable vital signs on room air.  Exam as above remarkable for left upper extremity AV fistula with arterial needle in place with no active bleeding.  Patient was discussed with vascular surgery consultant Dr. Monica Martinez who came and saw the patient at bedside.  Please see his note for details regarding his evaluation recommendations.  In brief he stated the patient was safe to have their AV fistula graft the accessed and would be safe for outpatient follow-up on Monday.  CBC showed a hemoglobin of 10 which is stable compared to prior levels.  AV fistula was de-accessed and pressure was applied until hemostasis was achieved.  Patient discharged in stable condition.  Strict return precautions advised and discussed.  Instructed to follow-up on Monday at her already scheduled outpatient vascular appointment and to return in the interim should any bleeding recur but she was unable to control with pressure.  Final Clinical Impressions(s) / ED Diagnoses   Final diagnoses:  Bleeding from dialysis shunt, initial encounter  [facility])    ED Discharge Orders    None       Hulan Saas, MD 04/26/18 2329    Davonna Belling, MD 04/27/18 [phone]

## 2018-04-26 NOTE — ED Triage Notes (Signed)
Per [facility]: Patient to ED from dialysis center for persistent bleeding around fistula - patient states she has had increased bleeding and clotting times around venous access site - bleeds around site during dialysis and for an hour after removed the past couple of dialysis appointments. Patient denies pain. EMS VS: 200/110, CBG 222. No blood thinner besides heparin for dialysis treatments. Denies shortness of breath.

## 2018-04-26 NOTE — ED Notes (Signed)
Dr Clark at bedside

## 2018-04-26 NOTE — Progress Notes (Signed)
VAST RN to pt's bedside to de-access HD graft (2nd needle). Pt reported first needle was removed at Dialysis and ambulance was called after center was unable to stop bleeding. Bleeding from first site was stopped in ambulance after 90 mins pressure.  Second needle removed and pressure applied X 20 mins; site continuing to bleed. Obtained Surgicel Fibrillar and used with pressure X 20 mins more for total of 40 mins pressure held. Gauze pressure dressing placed and secured with paper tape. VAST RN stayed at pt's bedside X 10 mins more educating pt regarding steps to take if bleeding reoccurs including applying pressure, and calling 911 as care can be provided in ambulance. Also advised pt to leave pressure dsg in place for 24 hrs.  Pt reported she is scheduled to have her graft checked for blockage on Monday, 1/13 as she has had excessive bleeding 3X this week with dialysis treatments.

## 2018-04-26 NOTE — ED Notes (Signed)
IV team at bedside to de-access fistula's arterial access site - pressure being applied, but site continues to bleed. Provided Surgicel.

## 2018-04-26 NOTE — Consult Note (Signed)
Hospital Consult    Reason for Consult: Bleeding from left forearm loop graft Referring Physician: ED MRN #:  [id number]  History of Present Illness: This is a 38 y.o. female with end-stage renal disease secondary to diabetes that presents to the ED from her dialysis center for bleeding from her left forearm loop graft.  Patient states her loop graft was placed last year by Dr. Trula Slade.  She states for the last week to week and a half she has had prolonged bleeding from arterial and venous cannula sites in her graft.  She denies any ulcerations or thinning skin over the graft itself.  Denies being on blood thinners.  Prior to about a week and a half ago she states the graft was working normally.  Patient states she is already arranged to go to see [facility] on Monday for a fistulogram.  She got her full dialysis session today.  Her next dialysis is due next Tuesday and will be delayed one day due to her fistulogram.  Past Medical History:  Diagnosis Date  . Anemia   . Diabetic neuropathy ([place])   . Diabetic retinopathy ([place])   . ESRD (end stage renal disease) on dialysis [facility])    "MWF; [place]" (02/02/2017)  . Gastroparesis   . HA (headache)    "qd if I didn't take my RX" (02/02/2017)  . Hemodialysis access site with arteriovenous graft ([place])   . Hypertension   . Neuropathy   . Renal insufficiency   . Trigeminal neuralgia   . Type I diabetes mellitus ([place])     Past Surgical History:  Procedure Laterality Date  . AV FISTULA PLACEMENT Left 09/07/2016   Procedure: Left arm 1st Stage Basilic AV Fistula;  Surgeon: [name], MD;  Location: [facility] OR;  Service: Vascular;  Laterality: Left;  . DILATION AND CURETTAGE OF UTERUS    . EXCISIONAL HEMORRHOIDECTOMY    . EYE SURGERY    . INSERTION OF DIALYSIS CATHETER Right 09/07/2016   Procedure: INSERTION OF DIALYSIS CATHETER;  Surgeon: [name], MD;  Location: [facility] OR;  Service: Vascular;  Laterality: Right;  . RETINAL LASER PROCEDURE  Bilateral    "related to diabetes"  . REVISON OF ARTERIOVENOUS FISTULA Left 01/15/2017   Procedure: INSERTION OF  AV GORE-TEX GRAFT;  Surgeon: Elam Dutch, MD;  Location: [facility];  Service: Vascular;  Laterality: Left;  . TEE WITHOUT CARDIOVERSION N/A 01/11/2017   Procedure: TRANSESOPHAGEAL ECHOCARDIOGRAM (TEE);  Surgeon: Jerline Pain, MD;  Location: [facility] ENDOSCOPY;  Service: Cardiovascular;  Laterality: N/A;    No Known Allergies  Prior to Admission medications   Medication Sig Start Date End Date Taking? Authorizing Provider  amLODipine (NORVASC) 10 MG tablet Take 10 mg by mouth at bedtime. 10/01/17  Yes [provider]  ferric citrate (AURYXIA) 1 GM 210 MG(Fe) tablet Take 210 mg by mouth 3 (three) times daily with meals.   Yes [provider]  labetalol (NORMODYNE) 300 MG tablet Take 300 mg by mouth See admin instructions. Take 300mg  tablet at bedtime on diaylsis days M, W, F. Take 300mg  tablet twice daily on all other days 08/25/16  Yes [provider]  acetaminophen (TYLENOL) 500 MG tablet Take 1,000 mg by mouth every Monday, Wednesday, and Friday with hemodialysis.    [provider]  carbamazepine (TEGRETOL) 200 MG tablet Take 1 tablet (200 mg total) by mouth at bedtime. 05/07/17   Annita Brod, MD  diphenhydrAMINE (BENADRYL) 25 mg capsule Take 50  mg by mouth every Monday, Wednesday, and Friday with hemodialysis.    [provider]  escitalopram (LEXAPRO) 10 MG tablet Take 10 mg by mouth daily. 09/22/17   [provider]  gabapentin (NEURONTIN) 300 MG capsule Take 3 capsules (900 mg total) by mouth at bedtime. 10/10/17   Hongalgi, Lenis Dickinson, MD  insulin aspart protamine - aspart (NOVOLOG MIX 70/30 FLEXPEN) (70-30) 100 UNIT/ML FlexPen Inject 0.15-0.25 mLs (15-25 Units total) into the skin See admin instructions. On non-dialysis days inj 25 units in the morning and 15 units at night. On dialysis days inj 15 units in the morning and 15  units at night. 10/10/17   Hongalgi, Lenis Dickinson, MD  metoCLOPramide (REGLAN) 10 MG tablet Take 10 mg by mouth 3 (three) times daily with meals.     [provider]  ondansetron (ZOFRAN) 4 MG tablet Take 1 tablet (4 mg total) by mouth every 8 (eight) hours as needed for nausea or vomiting. 09/12/16   Rosita Fire, MD  pregabalin (LYRICA) 75 MG capsule Take 1 capsule (75 mg total) by mouth 2 (two) times daily. 04/09/18   Pieter Partridge, DO  promethazine (PHENERGAN) 25 MG tablet Take 1 tablet (25 mg total) by mouth every 8 (eight) hours as needed for nausea or vomiting. 09/12/16   Rosita Fire, MD  rosuvastatin (CRESTOR) 10 MG tablet Take 10 mg by mouth at bedtime. 03/30/17   [provider]    Social History   Socioeconomic History  . Marital status: Single    Spouse name: Not on file  . Number of children: 1  . Years of education: 110  . Highest education level: Not on file  Occupational History  . Occupation: Call Center    Employer: [employer]  . Financial resource strain: Not on file  . Food insecurity:    Worry: Not on file    Inability: Not on file  . Transportation needs:    Medical: Not on file    Non-medical: Not on file  Tobacco Use  . Smoking status: Never Smoker  . Smokeless tobacco: Never Used  Substance and Sexual Activity  . Alcohol use: Yes    Alcohol/week: 4.0 standard drinks    Types: 4 Standard drinks or equivalent per week    Comment: 02/02/2017 "nothing in the last 6 months; never had problem w/it"  . Drug use: No  . Sexual activity: Yes  Lifestyle  . Physical activity:    Days per week: Not on file    Minutes per session: Not on file  . Stress: Not on file  Relationships  . Social connections:    Talks on phone: Not on file    Gets together: Not on file    Attends religious service: Not on file    Active member of club or organization: Not on file    Attends meetings of clubs or organizations: Not on  file    Relationship status: Not on file  . Intimate partner violence:    Fear of current or ex partner: Not on file    Emotionally abused: Not on file    Physically abused: Not on file    Forced sexual activity: Not on file  Other Topics Concern  . Not on file  Social History Narrative   Patient lives at home with her father and daughter.    Education some college.   Right handed   Caffeine Five sodas daily.  Family History  Problem Relation Age of Onset  . Hyperlipidemia Mother   . Asthma Daughter   . Diabetes Maternal Grandmother   . Stroke Maternal Grandmother   . Kidney disease Maternal Grandfather   . Kidney disease Paternal Grandmother     ROS: [x]  Positive   [ ]  Negative   [ ]  All sytems reviewed and are negative  Cardiovascular: []  chest pain/pressure []  palpitations []  SOB lying flat []  DOE []  pain in legs while walking []  pain in legs at rest []  pain in legs at night []  non-healing ulcers []  hx of DVT []  swelling in legs  Pulmonary: []  productive cough []  asthma/wheezing []  home O2  Neurologic: []  weakness in []  arms []  legs []  numbness in []  arms []  legs []  hx of CVA []  mini stroke [] difficulty speaking or slurred speech []  temporary loss of vision in one eye []  dizziness  Hematologic: []  hx of cancer [x]  bleeding problems (left forearm loop graft) []  problems with blood clotting easily  Endocrine:   []  diabetes []  thyroid disease  GI []  vomiting blood []  blood in stool  GU: []  CKD/renal failure []  HD--[]  M/W/F or []  T/T/S []  burning with urination []  blood in urine  Psychiatric: []  anxiety []  depression  Musculoskeletal: []  arthritis []  joint pain  Integumentary: []  rashes []  ulcers  Constitutional: []  fever []  chills   Physical Examination  Vitals:   04/26/18 1619  BP: (!) 192/108  Pulse: 86  Resp: 11  Temp: 98.1 F (36.7 C)  SpO2: 97%   There is no height or weight on file to calculate  BMI.  General:  WDWN in NAD Gait: Not observed HENT: WNL, normocephalic Pulmonary: normal non-labored breathing, without Rales, rhonchi,  wheezing Cardiac: regular Abdomen: soft, NT/ND, no masses Vascular Exam/Pulses: left forearm loop graft, overlying skin has good integrity, no ulcerations, pulse in graft Extremities: without ischemic changes, without Gangrene , without cellulitis; without open wounds;  Musculoskeletal: no muscle wasting or atrophy  Neurologic: A&O X 3; Appropriate Affect ; SENSATION: normal; MOTOR FUNCTION:  moving all extremities equally. Speech is fluent/normal   CBC    Component Value Date/Time   WBC 5.6 10/10/2017 1258   RBC 3.34 (L) 10/10/2017 1258   HGB 10.4 (L) 10/10/2017 1258   HCT 32.0 (L) 10/10/2017 1258   PLT 207 10/10/2017 1258   MCV 95.8 10/10/2017 1258   MCV 96.5 10/30/2012 1908   MCH 31.1 10/10/2017 1258   MCHC 32.5 10/10/2017 1258   RDW 15.4 10/10/2017 1258   LYMPHSABS 1.8 10/09/2017 0514   MONOABS 0.4 10/09/2017 0514   EOSABS 0.1 10/09/2017 0514   BASOSABS 0.1 10/09/2017 0514    BMET    Component Value Date/Time   NA 131 (L) 10/10/2017 1258   K 4.3 10/10/2017 1258   CL 93 (L) 10/10/2017 1258   CO2 26 10/10/2017 1258   GLUCOSE 203 (H) 10/10/2017 1258   BUN 29 (H) 10/10/2017 1258   CREATININE 8.75 (H) 10/10/2017 1258   CREATININE 0.69 10/30/2012 1906   CALCIUM 8.7 (L) 10/10/2017 1258   GFRNONAA 5 (L) 10/10/2017 1258   GFRAA 6 (L) 10/10/2017 1258    COAGS: Lab Results  Component Value Date   INR 2.07 05/07/2017   INR 2.47 05/06/2017   INR 2.17 05/05/2017     Non-Invasive Vascular Imaging:    None   ASSESSMENT/PLAN: This is a 38 y.o. female with end-stage renal disease secondary to diabetes who presents with bleeding from her  left forearm AV graft during dialysis.  On evaluation in the ED she has a pulse in her forearm graft and I suspect she has an outflow obstruction that is causing her prolonged bleeding event at the  cannula sites.  The skin over the graft itself actually looks very good and I see no thinning skin or ulcerated segment.  The venous cannula has already been removed and is hemostatic.  I would recommend removing her arterial cannula holding pressure till its hemostatic and then she can be discharged home since she is already arranged for a fistulogram with [facility] on Monday.  She got her full dialysis session today and her next dialysis is scheduled for Tuesday and she will have a fistulogram prior to next dialysis session.  I reviewed the plan with her and her father and everyone is comfortable with the plan moving forward.  I see no indication for emergent revision in the OR tonight.  Marty Heck, MD [facility] Office: [phone] Pager: [place]

## 2018-05-18 ENCOUNTER — Encounter (HOSPITAL_COMMUNITY): Payer: Self-pay

## 2018-05-18 ENCOUNTER — Emergency Department (HOSPITAL_COMMUNITY)
Admission: EM | Admit: 2018-05-18 | Discharge: 2018-05-18 | Disposition: A | Discharge status: Home / Self Care | Payer: BLUE CROSS/BLUE SHIELD | Attending: Emergency Medicine | Admitting: Emergency Medicine

## 2018-05-18 ENCOUNTER — Emergency Department (HOSPITAL_COMMUNITY): Payer: BLUE CROSS/BLUE SHIELD

## 2018-05-18 ENCOUNTER — Other Ambulatory Visit: Payer: Self-pay

## 2018-05-18 DIAGNOSIS — E1022 Type 1 diabetes mellitus with diabetic chronic kidney disease: Secondary | ICD-10-CM | POA: Insufficient documentation

## 2018-05-18 DIAGNOSIS — E1040 Type 1 diabetes mellitus with diabetic neuropathy, unspecified: Secondary | ICD-10-CM | POA: Diagnosis not present

## 2018-05-18 DIAGNOSIS — Z794 Long term (current) use of insulin: Secondary | ICD-10-CM | POA: Insufficient documentation

## 2018-05-18 DIAGNOSIS — E1010 Type 1 diabetes mellitus with ketoacidosis without coma: Secondary | ICD-10-CM | POA: Diagnosis not present

## 2018-05-18 DIAGNOSIS — N186 End stage renal disease: Secondary | ICD-10-CM | POA: Diagnosis not present

## 2018-05-18 DIAGNOSIS — R739 Hyperglycemia, unspecified: Secondary | ICD-10-CM

## 2018-05-18 DIAGNOSIS — R52 Pain, unspecified: Secondary | ICD-10-CM | POA: Diagnosis present

## 2018-05-18 DIAGNOSIS — E1065 Type 1 diabetes mellitus with hyperglycemia: Secondary | ICD-10-CM | POA: Diagnosis not present

## 2018-05-18 DIAGNOSIS — Z79899 Other long term (current) drug therapy: Secondary | ICD-10-CM | POA: Diagnosis not present

## 2018-05-18 LAB — POCT I-STAT EG7
Acid-base deficit: 1 mmol/L (ref 0.0–2.0)
Bicarbonate: 25 mmol/L (ref 20.0–28.0)
Calcium, Ion: 1.11 mmol/L — ABNORMAL LOW (ref 1.15–1.40)
HCT: 35 % — ABNORMAL LOW (ref 36.0–46.0)
HEMOGLOBIN: 11.9 g/dL — AB (ref 12.0–15.0)
O2 Saturation: 99 %
Potassium: 4 mmol/L (ref 3.5–5.1)
Sodium: 128 mmol/L — ABNORMAL LOW (ref 135–145)
TCO2: 26 mmol/L (ref 22–32)
pCO2, Ven: 45.3 mmHg (ref 44.0–60.0)
pH, Ven: 7.349 (ref 7.250–7.430)
pO2, Ven: 153 mmHg — ABNORMAL HIGH (ref 32.0–45.0)

## 2018-05-18 LAB — BASIC METABOLIC PANEL
Anion gap: 21 — ABNORMAL HIGH (ref 5–15)
BUN: 60 mg/dL — ABNORMAL HIGH (ref 6–20)
CHLORIDE: 87 mmol/L — AB (ref 98–111)
CO2: 22 mmol/L (ref 22–32)
Calcium: 9.8 mg/dL (ref 8.9–10.3)
Creatinine, Ser: 10.84 mg/dL — ABNORMAL HIGH (ref 0.44–1.00)
GFR calc Af Amer: 5 mL/min — ABNORMAL LOW (ref >60)
GFR calc non Af Amer: 4 mL/min — ABNORMAL LOW (ref >60)
GLUCOSE: 586 mg/dL — AB (ref 70–99)
Potassium: 4.3 mmol/L (ref 3.5–5.1)
Sodium: 130 mmol/L — ABNORMAL LOW (ref 135–145)

## 2018-05-18 LAB — I-STAT CREATININE, ED: CREATININE: 11.3 mg/dL — AB (ref 0.44–1.00)

## 2018-05-18 MED ORDER — ONDANSETRON 4 MG PO TBDP
4.0000 mg | ORAL_TABLET | Freq: Once | ORAL | Status: AC
  Administered 2018-05-18: 4 mg via ORAL
  Filled 2018-05-18: qty 1

## 2018-05-18 MED ORDER — BENZONATATE 100 MG PO CAPS: 200.0000 mg | ORAL_CAPSULE | Freq: Three times a day (TID) | ORAL | 0 refills | Status: DC

## 2018-05-18 NOTE — ED Notes (Signed)
Patient transported to X-ray 

## 2018-05-18 NOTE — ED Notes (Signed)
Patient verbalizes understanding of discharge instructions. Pt states she understands that she is leaving against medical advice. States she understands the risks of leaving, states she would like to go home. Opportunity for questioning and answers were provided. Armband removed by staff, pt discharged from ED.

## 2018-05-18 NOTE — ED Notes (Signed)
Unsuccessful blood draw attempt x1

## 2018-05-18 NOTE — Discharge Instructions (Signed)
Please return to the ED if your symptoms start to worsen or you have shortness of breath, chest pain, increased vomiting or vomiting up blood.

## 2018-05-18 NOTE — ED Triage Notes (Signed)
Pt presents with c/o cough, NV, and chills for several days. Pt states she thinks she may have the flu. Pt states she missed her dialysis treatment yesterday because "they couldn't fit her in".

## 2018-05-18 NOTE — ED Provider Notes (Signed)
[facility] EMERGENCY DEPARTMENT Provider Note   CSN: [id number] Arrival date & time: 05/18/18  1732     History   Chief Complaint Chief Complaint  Patient presents with  . Cough  . Nausea    HPI Carrie [name] is a 38 y.o. female with a past medical history of type 1 diabetes, hypertension, ESRD on dialysis MWF, who presents to ED for 1 week history of influenza-like illness includes generalized body aches, chills, nausea, dry cough with several episodes of posttussive emesis and rhinorrhea.  She has not been taking any medications to help with her symptoms.  She was unable to complete dialysis yesterday because "they could not fit me in."  She denies any abdominal pain, chest pain, shortness of breath, sick contacts, recent international travel.  HPI  Past Medical History:  Diagnosis Date  . Anemia   . Diabetic neuropathy ([place])   . Diabetic retinopathy ([place])   . ESRD (end stage renal disease) on dialysis [facility])    "MWF; [place]" (02/02/2017)  . Gastroparesis   . HA (headache)    "qd if I didn't take my RX" (02/02/2017)  . Hemodialysis access site with arteriovenous graft ([place])   . Hypertension   . Neuropathy   . Renal insufficiency   . Trigeminal neuralgia   . Type I diabetes mellitus [facility])     Patient Active Problem List   Diagnosis Date Noted  . Acute cystitis without hematuria 10/09/2017  . Hypertensive urgency 10/09/2017  . Nausea & vomiting 10/08/2017  . Chest pain 05/04/2017  . ESRD (end stage renal disease) ([place]) 02/01/2017  . 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 Pregnancy test positive 12/22/2016  . Amenorrhea 12/12/2016  . Diabetes ([place]) 09/16/2016  . ESRD on hemodialysis ([place])   . Gastroparesis   . Intractable vomiting 09/04/2016  . CKD stage 5 due to type 1 diabetes mellitus ([place]) 09/04/2016  . Intractable vomiting with nausea 04/04/2015  . AKI (acute kidney injury) ([place]) 04/04/2015  . DKA (diabetic ketoacidoses) ([place]) 04/03/2015  .  Trigeminal neuralgia 12/12/2013  . HA (headache)   . DKA, type 2 ([place]) 10/24/2012  . Contact dermatitis 10/24/2012  . SORE THROAT 03/22/2010  . 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 FATIGUE 05/25/2009  . VAGINAL DISCHARGE 01/28/2009  . Carbuncle and furuncle of unspecified site 12/29/2008  . HYPERCHOLESTEROLEMIA 06/12/2008  . MICROALBUMINURIA 06/12/2008  . MONILIAL VAGINITIS 11/14/2007  . INGUINAL PAIN, BILATERAL 11/14/2007  . DYSURIA 10/17/2007  . CYSTITIS 09/05/2007  . DELAYED MENSES 09/05/2007  . RHINOCONJUNCTIVITIS, ALLERGIC 07/19/2007  . DENTAL PAIN 03/28/2007  . HYPERLIPIDEMIA 09/01/2006  . PELVIC INFLAMMATORY DISEASE 09/01/2006  . ABORTION, SPONTANEOUS 09/01/2006  . ABSCESS 09/01/2006  . HX, PERSONAL, PAST NONCOMPLIANCE 09/01/2006    Past Surgical History:  Procedure Laterality Date  . AV FISTULA PLACEMENT Left 09/07/2016   Procedure: Left arm 1st Stage Basilic AV Fistula;  Surgeon: [name], MD;  Location: [facility] OR;  Service: Vascular;  Laterality: Left;  . DILATION AND CURETTAGE OF UTERUS    . EXCISIONAL HEMORRHOIDECTOMY    . EYE SURGERY    . INSERTION OF DIALYSIS CATHETER Right 09/07/2016   Procedure: INSERTION OF DIALYSIS CATHETER;  Surgeon: [name], MD;  Location: [facility] OR;  Service: Vascular;  Laterality: Right;  . RETINAL LASER PROCEDURE Bilateral    "related to diabetes"  . REVISON OF ARTERIOVENOUS FISTULA Left 01/15/2017   Procedure: INSERTION OF  AV GORE-TEX GRAFT;  Surgeon: Elam Dutch, MD;  Location:  [facility] OR;  Service: Vascular;  Laterality: Left;  . TEE WITHOUT CARDIOVERSION N/A 01/11/2017   Procedure: TRANSESOPHAGEAL ECHOCARDIOGRAM (TEE);  Surgeon: Jerline Pain, MD;  Location[facility] ENDOSCOPY;  Service: Cardiovascular;  Laterality: N/A;     OB History    Gravida  7   Para  1   Term      Preterm  1   AB  5   Living  1     SAB  2   TAB  3   Ectopic      Multiple      Live Births               Home Medications    Prior to  Admission medications   Medication Sig Start Date End Date Taking? Authorizing Provider  amLODipine (NORVASC) 10 MG tablet Take 10 mg by mouth at bedtime. 10/01/17  Yes [provider]  carbamazepine (TEGRETOL) 200 MG tablet Take 1 tablet (200 mg total) by mouth at bedtime. 05/07/17  Yes Annita Brod, MD  diphenhydramine-acetaminophen (TYLENOL PM) 25-500 MG TABS tablet Take 1 tablet by mouth at bedtime as needed (pain).   Yes [provider]  ferric citrate (AURYXIA) 1 GM 210 MG(Fe) tablet Take 420 mg by mouth 3 (three) times daily with meals.    Yes [provider]  gabapentin (NEURONTIN) 300 MG capsule Take 3 capsules (900 mg total) by mouth at bedtime. Patient taking differently: Take 300 mg by mouth at bedtime.  10/10/17  Yes Hongalgi, Lenis Dickinson, MD  insulin aspart protamine - aspart (NOVOLOG MIX 70/30 FLEXPEN) (70-30) 100 UNIT/ML FlexPen Inject 0.15-0.25 mLs (15-25 Units total) into the skin See admin instructions. On non-dialysis days inj 25 units in the morning and 15 units at night. On dialysis days inj 15 units in the morning and 15 units at night. Patient taking differently: Inject 15 Units into the skin 3 (three) times daily with meals.  10/10/17  Yes Hongalgi, Lenis Dickinson, MD  labetalol (NORMODYNE) 300 MG tablet Take 300 mg by mouth See admin instructions. Take one tablet (300 mg) by mouth on Monday, Wednesday, Friday - dialysis days - at bedtime, take one tablet (300 mg) by mouth twice daily on Sunday, Tuesday, Thursday, Saturday - non-dialysis days 08/25/16  Yes [provider]  lisinopril (PRINIVIL,ZESTRIL) 20 MG tablet Take 20 mg by mouth at bedtime.    Yes [provider]  Menthol-Methyl Salicylate (MUSCLE RUB) 10-15 % CREA Apply 1 application topically daily as needed for muscle pain.   Yes [provider]  metoCLOPramide (REGLAN) 10 MG tablet Take 10 mg by mouth 3 (three) times daily before meals.    Yes [provider]    ondansetron (ZOFRAN-ODT) 4 MG disintegrating tablet Take 4 mg by mouth 2 (two) times daily as needed for nausea or vomiting.  01/05/18  Yes [provider]  Phenylephrine-DM-GG-APAP (TYLENOL COLD MULTI-SYMPTOM DAY) 5-10-200-325 MG/15ML LIQD Take 15 mLs by mouth every 4 (four) hours as needed (cough/cold symptoms).   Yes [provider]  pregabalin (LYRICA) 75 MG capsule Take 1 capsule (75 mg total) by mouth 2 (two) times daily. Patient taking differently: Take 75 mg by mouth at bedtime.  04/09/18  Yes Tomi Likens, Adam R, DO  promethazine (PHENERGAN) 25 MG tablet Take 1 tablet (25 mg total) by mouth every 8 (eight) hours as needed for nausea or vomiting. 09/12/16  Yes Rosita Fire, MD  benzonatate (TESSALON) 100 MG capsule Take 2 capsules (200  mg total) by mouth every 8 (eight) hours. 05/18/18   Kaaliyah Kita, PA-C  ondansetron (ZOFRAN) 4 MG tablet Take 1 tablet (4 mg total) by mouth every 8 (eight) hours as needed for nausea or vomiting. Patient not taking: Reported on 05/18/2018 09/12/16   Rosita Fire, MD    Family History Family History  Problem Relation Age of Onset  . Hyperlipidemia Mother   . Asthma Daughter   . Diabetes Maternal Grandmother   . Stroke Maternal Grandmother   . Kidney disease Maternal Grandfather   . Kidney disease Paternal Grandmother     Social History Social History   Tobacco Use  . Smoking status: Never Smoker  . Smokeless tobacco: Never Used  Substance Use Topics  . Alcohol use: Yes    Alcohol/week: 4.0 standard drinks    Types: 4 Standard drinks or equivalent per week    Comment: 02/02/2017 "nothing in the last 6 months; never had problem w/it"  . Drug use: No     Allergies   Patient has no known allergies.   Review of Systems Review of Systems  Constitutional: Positive for chills. Negative for fever.  HENT: Positive for postnasal drip, rhinorrhea and sinus pressure. Negative for ear pain, facial swelling and sore throat.    Respiratory: Positive for cough. Negative for shortness of breath.   Gastrointestinal: Positive for nausea and vomiting. Negative for abdominal pain.     Physical Exam Updated Vital Signs BP (!) 207/118 (BP Location: Right Arm)   Pulse 83   Temp 98 F (36.7 C) (Oral)   Resp 18   Ht 5\' 8"  (1.727 m)   Wt 86.6 kg   SpO2 96%   BMI 29.04 kg/m   Physical Exam Vitals signs and nursing note reviewed.  Constitutional:      General: She is not in acute distress.    Appearance: She is well-developed. She is not diaphoretic.  HENT:     Head: Normocephalic and atraumatic.     Right Ear: A middle ear effusion is present.     Left Ear: A middle ear effusion is present.     Nose: Nose normal.     Mouth/Throat:     Pharynx: Oropharynx is clear. Uvula midline.  Eyes:     General: No scleral icterus.       Right eye: No discharge.        Left eye: No discharge.     Conjunctiva/sclera: Conjunctivae normal.  Neck:     Musculoskeletal: Normal range of motion and neck supple.  Cardiovascular:     Rate and Rhythm: Normal rate and regular rhythm.     Heart sounds: Normal heart sounds. No murmur. No friction rub. No gallop.   Pulmonary:     Effort: Pulmonary effort is normal. No respiratory distress.     Breath sounds: Normal breath sounds.  Abdominal:     General: Bowel sounds are normal. There is no distension.     Palpations: Abdomen is soft.     Tenderness: There is no abdominal tenderness. There is no guarding.  Musculoskeletal: Normal range of motion.  Skin:    General: Skin is warm and dry.     Findings: No rash.  Neurological:     Mental Status: She is alert.     Motor: No abnormal muscle tone.     Coordination: Coordination normal.      ED Treatments / Results  Labs (all labs ordered are listed, but only abnormal results are  displayed) Labs Reviewed  BASIC METABOLIC PANEL - Abnormal; Notable for the following components:      Result Value   Sodium 130 (*)     Chloride 87 (*)    Glucose, Bld 586 (*)    BUN 60 (*)    Creatinine, Ser 10.84 (*)    GFR calc non Af Amer 4 (*)    GFR calc Af Amer 5 (*)    Anion gap 21 (*)    All other components within normal limits  I-STAT CREATININE, ED - Abnormal; Notable for the following components:   Creatinine, Ser 11.30 (*)    All other components within normal limits  POCT I-STAT EG7 - Abnormal; Notable for the following components:   pO2, Ven 153.0 (*)    Sodium 128 (*)    Calcium, Ion 1.11 (*)    HCT 35.0 (*)    Hemoglobin 11.9 (*)    All other components within normal limits    EKG None  Radiology Dg Chest 2 View  Result Date: 05/18/2018 CLINICAL DATA:  Flu symptoms. Cough, chills, and shortness of breath for 1 week. Missed dialysis yesterday. EXAM: CHEST - 2 VIEW COMPARISON:  10/08/2017 FINDINGS: Cardiac enlargement. Linear scarring in the left mid lung. No airspace disease or consolidation in the lungs. No blunting of costophrenic angles. No pneumothorax. Mediastinal contours appear intact. IMPRESSION: Cardiac enlargement. No evidence of active pulmonary disease. Electronically Signed   By: Lucienne Capers M.D.   On: 05/18/2018 19:56    Procedures Procedures (including critical care time)  Medications Ordered in ED Medications  ondansetron (ZOFRAN-ODT) disintegrating tablet 4 mg (4 mg Oral Given 05/18/18 1853)     Initial Impression / Assessment and Plan / ED Course  I have reviewed the triage vital signs and the nursing notes.  Pertinent labs & imaging results that were available during my care of the patient were reviewed by me and considered in my medical decision making (see chart for details).  Clinical Course as of May 19 2111  Sat May 18, 3076  [id number] 38 year old female with a past medical history of type 1 diabetes, ESRD on dialysis MWF, presents to ED for 1 week history of influenza-like illness.  She reports several episodes of posttussive emesis although does have improvement  with Reglan at home.  She missed dialysis yesterday due to scheduling conflict apparently.   [HK]  2046 Glucose(!!): 586 [HK]  2046 Sodium(!): 130 [HK]  2046 Chloride(!): 87 [HK]  2046 Creatinine(!): 10.84 [HK]  2046 Anion gap(!): 21 [HK]  2046 Chest x-ray shows cardiomegaly.  Blood pressure improved to 786V systolic, after taking her home lisinopril and amlodipine.  Patient is electing to leave AMA.  She states that she has hyperglycemia because "grandpa thought my sugar was low so he just kept feeding me."  She did take a dose of insulin prior to arrival.  I stressed how due to her chronic medical issues, it is difficult to hydrate her to normalize her labs.  She states that "I cannot stay."  She will sign out AMA.   [HK]    Clinical Course User Index [HK] Lashawne Dura, PA-C    I have discussed my concerns as a provider and the possibility that this may worsen. We discussed the nature, risks and benefits, and alternatives to treatment. I have specifically discussed that without further evaluation I cannot guarantee there is not a life threatening event occuring.  Time was given to allow the opportunity to ask questions  and consider the options, and after the discussion, the patient decided to refuse the offered treatment. Patient is alert, their own POA and states understanding of my concerns and the possible consequences. After refusal, I made every reasonable opportunity to treat them to the best of my ability. I have made the patient aware that this is an [place] discharge, but she may return at any time for further evaluation and treatment. Will give tessalon Perles to help with cough.   Portions of this note were generated with [employer]. Dictation errors may occur despite best attempts at proofreading.  Final Clinical Impressions(s) / ED Diagnoses   Final diagnoses:  Hyperglycemia    ED Discharge Orders         Ordered    benzonatate (TESSALON) 100 MG capsule  Every 8  hours     05/18/18 2044           Delia Heady, PA-C 05/18/18 2113    Pattricia Boss, MD 05/19/18 [phone]

## 2018-05-27 ENCOUNTER — Emergency Department (HOSPITAL_COMMUNITY)
Admission: EM | Admit: 2018-05-27 | Discharge: 2018-05-28 | Disposition: A | Discharge status: Home / Self Care | Payer: BLUE CROSS/BLUE SHIELD | Attending: Emergency Medicine | Admitting: Emergency Medicine

## 2018-05-27 DIAGNOSIS — Z992 Dependence on renal dialysis: Secondary | ICD-10-CM | POA: Insufficient documentation

## 2018-05-27 DIAGNOSIS — R5383 Other fatigue: Secondary | ICD-10-CM | POA: Diagnosis present

## 2018-05-27 DIAGNOSIS — Z79899 Other long term (current) drug therapy: Secondary | ICD-10-CM | POA: Diagnosis not present

## 2018-05-27 DIAGNOSIS — I120 Hypertensive chronic kidney disease with stage 5 chronic kidney disease or end stage renal disease: Secondary | ICD-10-CM | POA: Insufficient documentation

## 2018-05-27 DIAGNOSIS — N186 End stage renal disease: Secondary | ICD-10-CM | POA: Diagnosis not present

## 2018-05-27 DIAGNOSIS — E10319 Type 1 diabetes mellitus with unspecified diabetic retinopathy without macular edema: Secondary | ICD-10-CM | POA: Diagnosis not present

## 2018-05-27 DIAGNOSIS — E1040 Type 1 diabetes mellitus with diabetic neuropathy, unspecified: Secondary | ICD-10-CM | POA: Insufficient documentation

## 2018-05-27 DIAGNOSIS — E1022 Type 1 diabetes mellitus with diabetic chronic kidney disease: Secondary | ICD-10-CM | POA: Diagnosis not present

## 2018-05-27 DIAGNOSIS — R112 Nausea with vomiting, unspecified: Secondary | ICD-10-CM | POA: Diagnosis not present

## 2018-05-27 DIAGNOSIS — R197 Diarrhea, unspecified: Secondary | ICD-10-CM | POA: Insufficient documentation

## 2018-05-27 LAB — COMPREHENSIVE METABOLIC PANEL
ALT: 25 U/L (ref 0–44)
AST: 27 U/L (ref 15–41)
Albumin: 4.2 g/dL (ref 3.5–5.0)
Alkaline Phosphatase: 77 U/L (ref 38–126)
Anion gap: 15 (ref 5–15)
BUN: 24 mg/dL — ABNORMAL HIGH (ref 6–20)
CALCIUM: 9.3 mg/dL (ref 8.9–10.3)
CO2: 30 mmol/L (ref 22–32)
Chloride: 91 mmol/L — ABNORMAL LOW (ref 98–111)
Creatinine, Ser: 7.8 mg/dL — ABNORMAL HIGH (ref 0.44–1.00)
GFR calc Af Amer: 7 mL/min — ABNORMAL LOW (ref >60)
GFR calc non Af Amer: 6 mL/min — ABNORMAL LOW (ref >60)
Glucose, Bld: 312 mg/dL — ABNORMAL HIGH (ref 70–99)
Potassium: 3.6 mmol/L (ref 3.5–5.1)
Sodium: 136 mmol/L (ref 135–145)
Total Bilirubin: 0.6 mg/dL (ref 0.3–1.2)
Total Protein: 7.5 g/dL (ref 6.5–8.1)

## 2018-05-27 LAB — CBC WITH DIFFERENTIAL/PLATELET
Abs Immature Granulocytes: 0.02 10*3/uL (ref 0.00–0.07)
BASOS ABS: 0.1 10*3/uL (ref 0.0–0.1)
BASOS PCT: 1 %
Eosinophils Absolute: 0.1 10*3/uL (ref 0.0–0.5)
Eosinophils Relative: 2 %
HCT: 37.6 % (ref 36.0–46.0)
Hemoglobin: 12.1 g/dL (ref 12.0–15.0)
Immature Granulocytes: 0 %
Lymphocytes Relative: 20 %
Lymphs Abs: 1.1 10*3/uL (ref 0.7–4.0)
MCH: 31.2 pg (ref 26.0–34.0)
MCHC: 32.2 g/dL (ref 30.0–36.0)
MCV: 96.9 fL (ref 80.0–100.0)
Monocytes Absolute: 0.5 10*3/uL (ref 0.1–1.0)
Monocytes Relative: 8 %
Neutro Abs: 4 10*3/uL (ref 1.7–7.7)
Neutrophils Relative %: 69 %
PLATELETS: 310 10*3/uL (ref 150–400)
RBC: 3.88 MIL/uL (ref 3.87–5.11)
RDW: 19.6 % — ABNORMAL HIGH (ref 11.5–15.5)
WBC: 5.8 10*3/uL (ref 4.0–10.5)
nRBC: 0 % (ref 0.0–0.2)

## 2018-05-27 MED ORDER — PROMETHAZINE HCL 25 MG/ML IJ SOLN
12.5000 mg | Freq: Once | INTRAMUSCULAR | Status: AC
  Administered 2018-05-27: 12.5 mg via INTRAVENOUS
  Filled 2018-05-27: qty 1

## 2018-05-27 MED ORDER — LACTATED RINGERS IV BOLUS
500.0000 mL | Freq: Once | INTRAVENOUS | Status: AC
  Administered 2018-05-27: 500 mL via INTRAVENOUS

## 2018-05-27 MED ORDER — DIPHENHYDRAMINE HCL 25 MG PO CAPS
25.0000 mg | ORAL_CAPSULE | Freq: Once | ORAL | Status: DC
  Filled 2018-05-27: qty 1

## 2018-05-27 MED ORDER — METOCLOPRAMIDE HCL 5 MG/ML IJ SOLN
10.0000 mg | Freq: Once | INTRAMUSCULAR | Status: AC
  Administered 2018-05-27: 10 mg via INTRAVENOUS
  Filled 2018-05-27: qty 2

## 2018-05-27 MED ORDER — PREGABALIN 25 MG PO CAPS
75.0000 mg | ORAL_CAPSULE | Freq: Once | ORAL | Status: AC
  Administered 2018-05-28: 75 mg via ORAL
  Filled 2018-05-27: qty 3

## 2018-05-27 MED ORDER — DIPHENHYDRAMINE HCL 50 MG/ML IJ SOLN
25.0000 mg | Freq: Once | INTRAMUSCULAR | Status: AC
  Administered 2018-05-27: 25 mg via INTRAVENOUS
  Filled 2018-05-27: qty 1

## 2018-05-27 MED ORDER — GABAPENTIN 300 MG PO CAPS
600.0000 mg | ORAL_CAPSULE | Freq: Once | ORAL | Status: AC
  Administered 2018-05-28: 600 mg via ORAL
  Filled 2018-05-27: qty 2

## 2018-05-27 NOTE — ED Triage Notes (Signed)
Pt was seen 2/1 for flu like symptoms and reports it hasn't changed. Dialysis pt m/w/f (went today)

## 2018-05-27 NOTE — ED Provider Notes (Signed)
[place] EMERGENCY DEPARTMENT Provider Note   CSN: [id number] Arrival date & time: 05/27/18  1708     History   Chief Complaint Chief Complaint  Patient presents with  . Influenza    HPI DEVIKA DRAGOVICH is a 38 y.o. female.   Influenza  Presenting symptoms: fatigue, nausea and rhinorrhea   Presenting symptoms: no cough, no fever, no shortness of breath, no sore throat and no vomiting   Severity:  Mild Onset quality:  Gradual Duration:  1 week Progression:  Waxing and waning Chronicity:  Recurrent Relieved by:  Nothing Worsened by:  Nothing Ineffective treatments: Z pack. Associated symptoms: decreased appetite and decreased physical activity   Associated symptoms: no chills, no ear pain, no mental status change, no congestion, no neck stiffness and no syncope   Risk factors: kidney disease and sick contacts     Past Medical History:  Diagnosis Date  . Anemia   . Diabetic neuropathy ([place])   . Diabetic retinopathy ([place])   . ESRD (end stage renal disease) on dialysis [facility])    "MWF; [place]" (02/02/2017)  . Gastroparesis   . HA (headache)    "qd if I didn't take my RX" (02/02/2017)  . Hemodialysis access site with arteriovenous graft ([place])   . Hypertension   . Neuropathy   . Renal insufficiency   . Trigeminal neuralgia   . Type I diabetes mellitus [facility])     Patient Active Problem List   Diagnosis Date Noted  . Acute cystitis without hematuria 10/09/2017  . Hypertensive urgency 10/09/2017  . Nausea & vomiting 10/08/2017  . Chest pain 05/04/2017  . ESRD (end stage renal disease) ([place]) 02/01/2017  . 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 Pregnancy test positive 12/22/2016  . Amenorrhea 12/12/2016  . Diabetes ([place]) 09/16/2016  . ESRD on hemodialysis ([place])   . Gastroparesis   . Intractable vomiting 09/04/2016  . 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 Intractable vomiting with nausea 04/04/2015  . AKI (acute kidney injury)  ([place]) 04/04/2015  . DKA (diabetic ketoacidoses) ([place]) 04/03/2015  . Trigeminal neuralgia 12/12/2013  . HA (headache)   . DKA, type 2 ([place]) 10/24/2012  . Contact dermatitis 10/24/2012  . SORE THROAT 03/22/2010  . 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 FATIGUE 05/25/2009  . VAGINAL DISCHARGE 01/28/2009  . Carbuncle and furuncle of unspecified site 12/29/2008  . HYPERCHOLESTEROLEMIA 06/12/2008  . MICROALBUMINURIA 06/12/2008  . MONILIAL VAGINITIS 11/14/2007  . INGUINAL PAIN, BILATERAL 11/14/2007  . DYSURIA 10/17/2007  . CYSTITIS 09/05/2007  . DELAYED MENSES 09/05/2007  . RHINOCONJUNCTIVITIS, ALLERGIC 07/19/2007  . DENTAL PAIN 03/28/2007  . HYPERLIPIDEMIA 09/01/2006  . PELVIC INFLAMMATORY DISEASE 09/01/2006  . ABORTION, SPONTANEOUS 09/01/2006  . ABSCESS 09/01/2006  . HX, PERSONAL, PAST NONCOMPLIANCE 09/01/2006    Past Surgical History:  Procedure Laterality Date  . AV FISTULA PLACEMENT Left 09/07/2016   Procedure: Left arm 1st Stage Basilic AV Fistula;  Surgeon: [name], MD;  Location: [facility] OR;  Service: Vascular;  Laterality: Left;  . DILATION AND CURETTAGE OF UTERUS    . EXCISIONAL HEMORRHOIDECTOMY    . EYE SURGERY    . INSERTION OF DIALYSIS CATHETER Right 09/07/2016   Procedure: INSERTION OF DIALYSIS CATHETER;  Surgeon: [name], MD;  Location: [facility] OR;  Service: Vascular;  Laterality: Right;  . RETINAL LASER PROCEDURE Bilateral    "related to diabetes"  . REVISON OF ARTERIOVENOUS FISTULA Left 01/15/2017   Procedure: INSERTION OF  AV GORE-TEX GRAFT;  Surgeon: Elam Dutch, MD;  Location: [place];  Service: Vascular;  Laterality: Left;  . TEE WITHOUT CARDIOVERSION N/A 01/11/2017   Procedure: TRANSESOPHAGEAL ECHOCARDIOGRAM (TEE);  Surgeon: Jerline Pain, MD;  Location: [facility] ENDOSCOPY;  Service: Cardiovascular;  Laterality: N/A;     OB History    Gravida  7   Para  1   Term      Preterm  1   AB  5   Living  1     SAB  2   TAB  3   Ectopic      Multiple        Live Births               Home Medications    Prior to Admission medications   Medication Sig Start Date End Date Taking? Authorizing Provider  amLODipine (NORVASC) 10 MG tablet Take 10 mg by mouth at bedtime. 10/01/17   [provider]  benzonatate (TESSALON) 100 MG capsule Take 2 capsules (200 mg total) by mouth every 8 (eight) hours. 05/18/18   Khatri, Hina, PA-C  carbamazepine (TEGRETOL) 200 MG tablet Take 1 tablet (200 mg total) by mouth at bedtime. 05/07/17   Annita Brod, MD  diphenhydramine-acetaminophen (TYLENOL PM) 25-500 MG TABS tablet Take 1 tablet by mouth at bedtime as needed (pain).    [provider]  ferric citrate (AURYXIA) 1 GM 210 MG(Fe) tablet Take 420 mg by mouth 3 (three) times daily with meals.     [provider]  gabapentin (NEURONTIN) 300 MG capsule Take 3 capsules (900 mg total) by mouth at bedtime. Patient taking differently: Take 300 mg by mouth at bedtime.  10/10/17   Hongalgi, Lenis Dickinson, MD  insulin aspart protamine - aspart (NOVOLOG MIX 70/30 FLEXPEN) (70-30) 100 UNIT/ML FlexPen Inject 0.15-0.25 mLs (15-25 Units total) into the skin See admin instructions. On non-dialysis days inj 25 units in the morning and 15 units at night. On dialysis days inj 15 units in the morning and 15 units at night. Patient taking differently: Inject 15 Units into the skin 3 (three) times daily with meals.  10/10/17   Hongalgi, Lenis Dickinson, MD  labetalol (NORMODYNE) 300 MG tablet Take 300 mg by mouth See admin instructions. Take one tablet (300 mg) by mouth on Monday, Wednesday, Friday - dialysis days - at bedtime, take one tablet (300 mg) by mouth twice daily on Sunday, Tuesday, Thursday, Saturday - non-dialysis days 08/25/16   [provider]  lisinopril (PRINIVIL,ZESTRIL) 20 MG tablet Take 20 mg by mouth at bedtime.     [provider]  Menthol-Methyl Salicylate (MUSCLE RUB) 10-15 % CREA Apply 1 application topically daily as needed for  muscle pain.    [provider]  metoCLOPramide (REGLAN) 10 MG tablet Take 10 mg by mouth 3 (three) times daily before meals.     [provider]  ondansetron (ZOFRAN) 4 MG tablet Take 1 tablet (4 mg total) by mouth every 8 (eight) hours as needed for nausea or vomiting. Patient not taking: Reported on 05/18/2018 09/12/16   Rosita Fire, MD  ondansetron (ZOFRAN-ODT) 4 MG disintegrating tablet Take 4 mg by mouth 2 (two) times daily as needed for nausea or vomiting.  01/05/18   [provider]  Phenylephrine-DM-GG-APAP (TYLENOL COLD MULTI-SYMPTOM DAY) 5-10-200-325 MG/15ML LIQD Take 15 mLs by mouth every 4 (four) hours as needed (cough/cold symptoms).    [provider]  pregabalin (LYRICA) 75 MG  capsule Take 1 capsule (75 mg total) by mouth 2 (two) times daily. Patient taking differently: Take 75 mg by mouth at bedtime.  04/09/18   Pieter Partridge, DO  promethazine (PHENERGAN) 25 MG tablet Take 1 tablet (25 mg total) by mouth every 8 (eight) hours as needed for nausea or vomiting. 09/12/16   Rosita Fire, MD    Family History Family History  Problem Relation Age of Onset  . Hyperlipidemia Mother   . Asthma Daughter   . Diabetes Maternal Grandmother   . Stroke Maternal Grandmother   . Kidney disease Maternal Grandfather   . Kidney disease Paternal Grandmother     Social History Social History   Tobacco Use  . Smoking status: Never Smoker  . Smokeless tobacco: Never Used  Substance Use Topics  . Alcohol use: Yes    Alcohol/week: 4.0 standard drinks    Types: 4 Standard drinks or equivalent per week    Comment: 02/02/2017 "nothing in the last 6 months; never had problem w/it"  . Drug use: No     Allergies   Patient has no known allergies.   Review of Systems Review of Systems  Constitutional: Positive for decreased appetite and fatigue. Negative for chills and fever.  HENT: Positive for rhinorrhea. Negative for congestion, ear pain  and sore throat.   Eyes: Negative for pain and visual disturbance.  Respiratory: Negative for cough and shortness of breath.   Cardiovascular: Negative for chest pain and palpitations.  Gastrointestinal: Positive for nausea. Negative for abdominal pain and vomiting.  Genitourinary: Negative for dysuria and hematuria.  Musculoskeletal: Negative for arthralgias, back pain and neck stiffness.  Skin: Negative for color change and rash.  Neurological: Negative for seizures and syncope.  All other systems reviewed and are negative.    Physical Exam Updated Vital Signs BP (!) 202/108   Pulse 88   Temp 98.5 F (36.9 C) (Oral)   Resp 12   SpO2 96%   Physical Exam Vitals signs and nursing note reviewed.  Constitutional:      General: She is not in acute distress.    Appearance: She is well-developed.     Comments: Patient resting comfortably, no acute distress.  HENT:     Head: Normocephalic and atraumatic.  Eyes:     Conjunctiva/sclera: Conjunctivae normal.  Neck:     Musculoskeletal: Neck supple. No neck rigidity.  Cardiovascular:     Rate and Rhythm: Normal rate and regular rhythm.     Heart sounds: No murmur.  Pulmonary:     Effort: Pulmonary effort is normal. No respiratory distress.     Breath sounds: Normal breath sounds.     Comments: Lungs clear to auscultation throughout Abdominal:     Palpations: Abdomen is soft.     Tenderness: There is no abdominal tenderness.  Musculoskeletal:        General: No swelling.     Right lower leg: No edema.     Left lower leg: No edema.  Skin:    General: Skin is warm and dry.     Capillary Refill: Capillary refill takes less than 2 seconds.     Findings: No rash.  Neurological:     General: No focal deficit present.     Mental Status: She is alert and oriented to person, place, and time. Mental status is at baseline.  Psychiatric:        Mood and Affect: Mood normal.      ED Treatments / Results  Labs (all labs ordered  are listed, but only abnormal results are displayed) Labs Reviewed  COMPREHENSIVE METABOLIC PANEL - Abnormal; Notable for the following components:      Result Value   Chloride 91 (*)    Glucose, Bld 312 (*)    BUN 24 (*)    Creatinine, Ser 7.80 (*)    GFR calc non Af Amer 6 (*)    GFR calc Af Amer 7 (*)    All other components within normal limits  CBC WITH DIFFERENTIAL/PLATELET - Abnormal; Notable for the following components:   RDW 19.6 (*)    All other components within normal limits  INFLUENZA PANEL BY PCR (TYPE A & B)    EKG None  Radiology No results found.  Procedures Procedures (including critical care time)  Medications Ordered in ED Medications  labetalol (NORMODYNE) tablet 300 mg (has no administration in time range)  lisinopril (PRINIVIL,ZESTRIL) tablet 20 mg (has no administration in time range)  amLODipine (NORVASC) tablet 10 mg (has no administration in time range)  lactated ringers bolus 500 mL (500 mLs Intravenous New Bag/Given 05/27/18 2307)  metoCLOPramide (REGLAN) injection 10 mg (10 mg Intravenous Given 05/27/18 2318)  promethazine (PHENERGAN) injection 12.5 mg (12.5 mg Intravenous Given 05/27/18 2317)  diphenhydrAMINE (BENADRYL) injection 25 mg (25 mg Intravenous Given 05/27/18 2318)  pregabalin (LYRICA) capsule 75 mg (75 mg Oral Given 05/28/18 0005)  gabapentin (NEURONTIN) capsule 600 mg (600 mg Oral Given 05/28/18 0006)     Initial Impression / Assessment and Plan / ED Course  I have reviewed the triage vital signs and the nursing notes.  Pertinent labs & imaging results that were available during my care of the patient were reviewed by me and considered in my medical decision making (see chart for details).     MDM  38 year old female patient presents from home after continued fatigue, influenza-like symptoms for the past week.  Patient recently had negative flu test on the first of the month as well as a Z-Pak for which she is completed with no  resolution of symptoms.  Patient has significant past medical history of hypertension, ESRD on Monday Wednesday Friday dialysis secondary to diabetes.  Patient indicates that she had complete dialysis today, denies any shortness of breath, chest pain.  Patient has significant past of gastroparesis for which she typically has flareups and has decreased p.o. intake and fatigue.  Patient negates this currently might be also the cause of the patient's low energy levels and feeling fatigued.  Possible viral etiology however also likely secondary to the patient's gastroparesis.  We will give the patient what she indicates helps the most for gastroparesis which includes Reglan, Phenergan, Benadryl here in the emergency department as well as a small bolus of fluid due to some mild dehydration according the patient and decreased p.o. intake.  Will obtain EKG, baseline laboratory studies to assess electrolytes.  Patient in agreement with this plan.  Laboratory studies reviewed, using decision making regarding management.  We will give nighttime hypertensives, patient's nighttime gabapentin, Lyrica.  Transfer of care occurred at approximately 12 AM to oncoming attending provider.  Plan of care at this time is to wait on flu as well as chest x-ray.  The above care was discussed and agreed upon by my attending physician.  Final Clinical Impressions(s) / ED Diagnoses   Final diagnoses:  None    ED Discharge Orders    None       Orson Aloe, MD 05/28/18 [phone]  Margette Fast, MD 05/28/18 [phone]

## 2018-05-28 ENCOUNTER — Emergency Department (HOSPITAL_COMMUNITY): Payer: BLUE CROSS/BLUE SHIELD

## 2018-05-28 LAB — INFLUENZA PANEL BY PCR (TYPE A & B)
Influenza A By PCR: NEGATIVE
Influenza B By PCR: NEGATIVE

## 2018-05-28 MED ORDER — METOCLOPRAMIDE HCL 10 MG PO TABS: 10.0000 mg | ORAL_TABLET | Freq: Three times a day (TID) | ORAL | 0 refills | Status: AC

## 2018-05-28 MED ORDER — LABETALOL HCL 200 MG PO TABS
300.0000 mg | ORAL_TABLET | Freq: Once | ORAL | Status: DC
  Filled 2018-05-28: qty 2

## 2018-05-28 MED ORDER — LISINOPRIL 20 MG PO TABS
20.0000 mg | ORAL_TABLET | Freq: Once | ORAL | Status: DC
  Filled 2018-05-28: qty 1

## 2018-05-28 MED ORDER — AMLODIPINE BESYLATE 5 MG PO TABS
10.0000 mg | ORAL_TABLET | Freq: Once | ORAL | Status: DC
  Filled 2018-05-28: qty 2

## 2018-05-28 MED ORDER — PROMETHAZINE HCL 25 MG PO TABS: 25.0000 mg | ORAL_TABLET | Freq: Three times a day (TID) | ORAL | 0 refills | Status: DC | PRN

## 2018-05-28 NOTE — ED Notes (Signed)
Reviewed d/c instructions with pt, who verbalized understanding and had no outstanding questions. Pt departed in NAD.   

## 2018-05-28 NOTE — ED Provider Notes (Signed)
12:00 AM  Assumed care from Dr. Laverta Baltimore.  Patient is a 38 year old female with history of end-stage renal disease on hemodialysis Monday Wednesday and Friday who was last dialyzed today, gastroparesis who presents with complaints of nausea without vomiting.  States she has not been feeling well.  Labs show mild hyperglycemia but no signs of DKA.  She is hypertensive here.  Flu swab, chest x-ray pending.  Given Reglan, Benadryl, Phenergan for symptomatic relief.  12:40 AM  Pt's pressures in the 160s/80s.  She states that she has been feeling poorly for 3 weeks with nausea and diarrhea.  She has not had any episodes of vomiting here in the emergency department.  Reports she has Phenergan and Reglan at home.  Flu swab negative.  Chest x-ray clear.  Patient states that she has recently been a Z-Pak and that did not help her symptoms.  She states she has seen her primary care physician 3 times.  Also complains of chronic pain in bilateral lower extremities.  Is on gabapentin and states her PCP just darted her on tramadol.  Also complains of itching all over with dialysis that has been ongoing for weeks.  No rash.  Patient and her friend seem upset that she is not being admitted to the hospital.  Myself, Dr. Laverta Baltimore and resident physician have talked to the patient and family several times about reasons for admission to the hospital.  I do not feel she meets admission criteria at this time.  I feel she can be discharged home.  Patient verbalized understanding.   At this time, I do not feel there is any life-threatening condition present. I have reviewed and discussed all results (EKG, imaging, lab, urine as appropriate) and exam findings with patient/family. I have reviewed nursing notes and appropriate previous records.  I feel the patient is safe to be discharged home without further emergent workup and can continue workup as an outpatient as needed. Discussed usual and customary return precautions. Patient/family  verbalize understanding and are comfortable with this plan.  Outpatient follow-up has been provided as needed. All questions have been answered.    , Delice Bison, DO 05/28/18 [phone]

## 2018-05-28 NOTE — Discharge Instructions (Addendum)
Your labs were normal today other than a mildly elevated glucose level.  I recommend you keep your blood glucose under tight control as this can help with your gastroparesis.  Your chest x-ray showed no pneumonia or fluid on your lungs.  Your flu swab was negative.  I recommend that you take your blood pressure medications as prescribed as your blood pressure was slightly elevated today.  As for the pain in your legs, I recommend you continue your gabapentin and take your tramadol as prescribed by your primary care doctor.  Itching is a common symptom with patients on dialysis.  Currently your electrolytes are within normal limits and there is no life-threatening rash present.

## 2018-06-19 ENCOUNTER — Emergency Department (HOSPITAL_COMMUNITY): Payer: BLUE CROSS/BLUE SHIELD

## 2018-06-19 ENCOUNTER — Other Ambulatory Visit: Payer: Self-pay

## 2018-06-19 ENCOUNTER — Inpatient Hospital Stay (HOSPITAL_COMMUNITY)
Admission: EM | Admit: 2018-06-19 | Discharge: 2018-06-24 | DRG: 637 | Disposition: A | Discharge status: Home / Self Care | Payer: BLUE CROSS/BLUE SHIELD | Source: Other Acute Inpatient Hospital | Attending: Internal Medicine | Admitting: Internal Medicine

## 2018-06-19 DIAGNOSIS — R112 Nausea with vomiting, unspecified: Secondary | ICD-10-CM | POA: Diagnosis not present

## 2018-06-19 DIAGNOSIS — I7389 Other specified peripheral vascular diseases: Secondary | ICD-10-CM | POA: Diagnosis present

## 2018-06-19 DIAGNOSIS — N186 End stage renal disease: Secondary | ICD-10-CM | POA: Diagnosis present

## 2018-06-19 DIAGNOSIS — R0602 Shortness of breath: Secondary | ICD-10-CM

## 2018-06-19 DIAGNOSIS — E1043 Type 1 diabetes mellitus with diabetic autonomic (poly)neuropathy: Secondary | ICD-10-CM | POA: Diagnosis present

## 2018-06-19 DIAGNOSIS — Z833 Family history of diabetes mellitus: Secondary | ICD-10-CM

## 2018-06-19 DIAGNOSIS — H5340 Unspecified visual field defects: Secondary | ICD-10-CM | POA: Diagnosis not present

## 2018-06-19 DIAGNOSIS — Z992 Dependence on renal dialysis: Secondary | ICD-10-CM

## 2018-06-19 DIAGNOSIS — Z825 Family history of asthma and other chronic lower respiratory diseases: Secondary | ICD-10-CM

## 2018-06-19 DIAGNOSIS — Z823 Family history of stroke: Secondary | ICD-10-CM

## 2018-06-19 DIAGNOSIS — G500 Trigeminal neuralgia: Secondary | ICD-10-CM | POA: Diagnosis present

## 2018-06-19 DIAGNOSIS — K3184 Gastroparesis: Secondary | ICD-10-CM | POA: Diagnosis present

## 2018-06-19 DIAGNOSIS — G9341 Metabolic encephalopathy: Secondary | ICD-10-CM | POA: Diagnosis not present

## 2018-06-19 DIAGNOSIS — Z8349 Family history of other endocrine, nutritional and metabolic diseases: Secondary | ICD-10-CM

## 2018-06-19 DIAGNOSIS — E10319 Type 1 diabetes mellitus with unspecified diabetic retinopathy without macular edema: Secondary | ICD-10-CM | POA: Diagnosis present

## 2018-06-19 DIAGNOSIS — N250 Renal osteodystrophy: Secondary | ICD-10-CM | POA: Diagnosis present

## 2018-06-19 DIAGNOSIS — I160 Hypertensive urgency: Secondary | ICD-10-CM

## 2018-06-19 DIAGNOSIS — Z781 Physical restraint status: Secondary | ICD-10-CM | POA: Diagnosis not present

## 2018-06-19 DIAGNOSIS — G43909 Migraine, unspecified, not intractable, without status migrainosus: Secondary | ICD-10-CM | POA: Diagnosis present

## 2018-06-19 DIAGNOSIS — N2581 Secondary hyperparathyroidism of renal origin: Secondary | ICD-10-CM | POA: Diagnosis present

## 2018-06-19 DIAGNOSIS — I120 Hypertensive chronic kidney disease with stage 5 chronic kidney disease or end stage renal disease: Secondary | ICD-10-CM | POA: Diagnosis present

## 2018-06-19 DIAGNOSIS — R0789 Other chest pain: Secondary | ICD-10-CM

## 2018-06-19 DIAGNOSIS — E1110 Type 2 diabetes mellitus with ketoacidosis without coma: Secondary | ICD-10-CM | POA: Diagnosis not present

## 2018-06-19 DIAGNOSIS — I161 Hypertensive emergency: Secondary | ICD-10-CM | POA: Diagnosis present

## 2018-06-19 DIAGNOSIS — M25561 Pain in right knee: Secondary | ICD-10-CM | POA: Diagnosis present

## 2018-06-19 DIAGNOSIS — I674 Hypertensive encephalopathy: Secondary | ICD-10-CM | POA: Diagnosis present

## 2018-06-19 DIAGNOSIS — E1010 Type 1 diabetes mellitus with ketoacidosis without coma: Secondary | ICD-10-CM | POA: Diagnosis present

## 2018-06-19 DIAGNOSIS — R079 Chest pain, unspecified: Secondary | ICD-10-CM | POA: Diagnosis present

## 2018-06-19 DIAGNOSIS — E1065 Type 1 diabetes mellitus with hyperglycemia: Secondary | ICD-10-CM | POA: Diagnosis present

## 2018-06-19 DIAGNOSIS — S0093XA Contusion of unspecified part of head, initial encounter: Secondary | ICD-10-CM | POA: Diagnosis present

## 2018-06-19 DIAGNOSIS — W010XXA Fall on same level from slipping, tripping and stumbling without subsequent striking against object, initial encounter: Secondary | ICD-10-CM | POA: Diagnosis present

## 2018-06-19 DIAGNOSIS — Z794 Long term (current) use of insulin: Secondary | ICD-10-CM | POA: Diagnosis not present

## 2018-06-19 DIAGNOSIS — E1022 Type 1 diabetes mellitus with diabetic chronic kidney disease: Secondary | ICD-10-CM | POA: Diagnosis present

## 2018-06-19 DIAGNOSIS — D631 Anemia in chronic kidney disease: Secondary | ICD-10-CM | POA: Diagnosis present

## 2018-06-19 DIAGNOSIS — I6783 Posterior reversible encephalopathy syndrome: Secondary | ICD-10-CM | POA: Diagnosis not present

## 2018-06-19 DIAGNOSIS — A419 Sepsis, unspecified organism: Secondary | ICD-10-CM

## 2018-06-19 DIAGNOSIS — Z79899 Other long term (current) drug therapy: Secondary | ICD-10-CM | POA: Diagnosis not present

## 2018-06-19 DIAGNOSIS — I340 Nonrheumatic mitral (valve) insufficiency: Secondary | ICD-10-CM | POA: Diagnosis not present

## 2018-06-19 LAB — COMPREHENSIVE METABOLIC PANEL
ALT: 55 U/L — ABNORMAL HIGH (ref 0–44)
ANION GAP: 18 — AB (ref 5–15)
AST: 43 U/L — ABNORMAL HIGH (ref 15–41)
Albumin: 3.8 g/dL (ref 3.5–5.0)
Alkaline Phosphatase: 107 U/L (ref 38–126)
BUN: 43 mg/dL — ABNORMAL HIGH (ref 6–20)
CALCIUM: 9.1 mg/dL (ref 8.9–10.3)
CO2: 22 mmol/L (ref 22–32)
Chloride: 87 mmol/L — ABNORMAL LOW (ref 98–111)
Creatinine, Ser: 9.56 mg/dL — ABNORMAL HIGH (ref 0.44–1.00)
GFR calc Af Amer: 6 mL/min — ABNORMAL LOW (ref >60)
GFR calc non Af Amer: 5 mL/min — ABNORMAL LOW (ref >60)
Glucose, Bld: 810 mg/dL (ref 70–99)
Potassium: 4.1 mmol/L (ref 3.5–5.1)
Sodium: 127 mmol/L — ABNORMAL LOW (ref 135–145)
Total Bilirubin: 1.2 mg/dL (ref 0.3–1.2)
Total Protein: 6.7 g/dL (ref 6.5–8.1)

## 2018-06-19 LAB — CBC
HEMATOCRIT: 33.8 % — AB (ref 36.0–46.0)
Hemoglobin: 11.1 g/dL — ABNORMAL LOW (ref 12.0–15.0)
MCH: 30.3 pg (ref 26.0–34.0)
MCHC: 32.8 g/dL (ref 30.0–36.0)
MCV: 92.3 fL (ref 80.0–100.0)
Platelets: 136 10*3/uL — ABNORMAL LOW (ref 150–400)
RBC: 3.66 MIL/uL — ABNORMAL LOW (ref 3.87–5.11)
RDW: 17.8 % — ABNORMAL HIGH (ref 11.5–15.5)
WBC: 5.5 10*3/uL (ref 4.0–10.5)
nRBC: 0 % (ref 0.0–0.2)

## 2018-06-19 LAB — CBC WITH DIFFERENTIAL/PLATELET
Abs Immature Granulocytes: 0.02 10*3/uL (ref 0.00–0.07)
Basophils Absolute: 0.1 10*3/uL (ref 0.0–0.1)
Basophils Relative: 1 %
EOS ABS: 0 10*3/uL (ref 0.0–0.5)
Eosinophils Relative: 0 %
HCT: 35 % — ABNORMAL LOW (ref 36.0–46.0)
Hemoglobin: 11.3 g/dL — ABNORMAL LOW (ref 12.0–15.0)
Immature Granulocytes: 0 %
Lymphocytes Relative: 11 %
Lymphs Abs: 0.5 10*3/uL — ABNORMAL LOW (ref 0.7–4.0)
MCH: 30.8 pg (ref 26.0–34.0)
MCHC: 32.3 g/dL (ref 30.0–36.0)
MCV: 95.4 fL (ref 80.0–100.0)
Monocytes Absolute: 0.2 10*3/uL (ref 0.1–1.0)
Monocytes Relative: 4 %
NEUTROS PCT: 84 %
Neutro Abs: 4.3 10*3/uL (ref 1.7–7.7)
Platelets: 135 10*3/uL — ABNORMAL LOW (ref 150–400)
RBC: 3.67 MIL/uL — ABNORMAL LOW (ref 3.87–5.11)
RDW: 17.5 % — AB (ref 11.5–15.5)
WBC: 5.1 10*3/uL (ref 4.0–10.5)
nRBC: 0 % (ref 0.0–0.2)

## 2018-06-19 LAB — RAPID HIV SCREEN (HIV 1/2 AB+AG)
HIV 1/2 Antibodies: NONREACTIVE
HIV-1 P24 Antigen - HIV24: NONREACTIVE

## 2018-06-19 LAB — BLOOD GAS, ARTERIAL
Acid-base deficit: 1.8 mmol/L (ref 0.0–2.0)
Bicarbonate: 22.4 mmol/L (ref 20.0–28.0)
Drawn by: 550621
FIO2: 0.21
O2 Saturation: 96.9 %
Patient temperature: 98.6
pCO2 arterial: 38 mmHg (ref 32.0–48.0)
pH, Arterial: 7.388 (ref 7.350–7.450)
pO2, Arterial: 90.1 mmHg (ref 83.0–108.0)

## 2018-06-19 LAB — BETA-HYDROXYBUTYRIC ACID: Beta-Hydroxybutyric Acid: 0.17 mmol/L (ref 0.05–0.27)

## 2018-06-19 LAB — TROPONIN I
Troponin I: 0.06 ng/mL (ref <0.03)
Troponin I: 0.07 ng/mL (ref <0.03)

## 2018-06-19 LAB — LIPASE, BLOOD: Lipase: 25 U/L (ref 11–51)

## 2018-06-19 LAB — CBG MONITORING, ED
Glucose-Capillary: >600 mg/dL (ref 70–99)
Glucose-Capillary: >600 mg/dL (ref 70–99)
Glucose-Capillary: >600 mg/dL (ref 70–99)
Glucose-Capillary: 482 mg/dL — ABNORMAL HIGH (ref 70–99)

## 2018-06-19 LAB — BASIC METABOLIC PANEL
Anion gap: 23 — ABNORMAL HIGH (ref 5–15)
BUN: 49 mg/dL — ABNORMAL HIGH (ref 6–20)
CHLORIDE: 91 mmol/L — AB (ref 98–111)
CO2: 18 mmol/L — AB (ref 22–32)
Calcium: 9.8 mg/dL (ref 8.9–10.3)
Creatinine, Ser: 9.95 mg/dL — ABNORMAL HIGH (ref 0.44–1.00)
GFR calc Af Amer: 5 mL/min — ABNORMAL LOW (ref >60)
GFR calc non Af Amer: 5 mL/min — ABNORMAL LOW (ref >60)
Glucose, Bld: 376 mg/dL — ABNORMAL HIGH (ref 70–99)
Potassium: 3.7 mmol/L (ref 3.5–5.1)
Sodium: 132 mmol/L — ABNORMAL LOW (ref 135–145)

## 2018-06-19 LAB — GLUCOSE, CAPILLARY
GLUCOSE-CAPILLARY: 158 mg/dL — AB (ref 70–99)
GLUCOSE-CAPILLARY: 223 mg/dL — AB (ref 70–99)
Glucose-Capillary: 326 mg/dL — ABNORMAL HIGH (ref 70–99)

## 2018-06-19 LAB — I-STAT BETA HCG BLOOD, ED (MC, WL, AP ONLY): I-stat hCG, quantitative: <5 m[IU]/mL (ref <5)

## 2018-06-19 MED ORDER — POTASSIUM CHLORIDE 10 MEQ/100ML IV SOLN
10.0000 meq | INTRAVENOUS | Status: AC
  Administered 2018-06-19 (×2): 10 meq via INTRAVENOUS
  Filled 2018-06-19 (×2): qty 100

## 2018-06-19 MED ORDER — INSULIN REGULAR(HUMAN) IN NACL 100-0.9 UT/100ML-% IV SOLN
INTRAVENOUS | Status: DC
  Administered 2018-06-19: 5.4 [IU]/h via INTRAVENOUS
  Filled 2018-06-19: qty 100

## 2018-06-19 MED ORDER — INSULIN REGULAR(HUMAN) IN NACL 100-0.9 UT/100ML-% IV SOLN: INTRAVENOUS | Status: DC

## 2018-06-19 MED ORDER — HYDROMORPHONE HCL 1 MG/ML IJ SOLN
1.0000 mg | Freq: Once | INTRAMUSCULAR | Status: AC
  Administered 2018-06-19: 1 mg via INTRAVENOUS
  Filled 2018-06-19: qty 1

## 2018-06-19 MED ORDER — SODIUM CHLORIDE 0.9 % IV SOLN: INTRAVENOUS | Status: DC

## 2018-06-19 MED ORDER — SODIUM CHLORIDE 0.9 % IV BOLUS
250.0000 mL | Freq: Once | INTRAVENOUS | Status: AC
  Administered 2018-06-19: 250 mL via INTRAVENOUS

## 2018-06-19 MED ORDER — DEXTROSE-NACL 5-0.45 % IV SOLN
INTRAVENOUS | Status: DC
  Administered 2018-06-19: 23:00:00 via INTRAVENOUS

## 2018-06-19 MED ORDER — HYDRALAZINE HCL 20 MG/ML IJ SOLN
10.0000 mg | Freq: Once | INTRAMUSCULAR | Status: AC
  Administered 2018-06-19: 10 mg via INTRAVENOUS
  Filled 2018-06-19: qty 1

## 2018-06-19 MED ORDER — KETOROLAC TROMETHAMINE 30 MG/ML IJ SOLN
30.0000 mg | Freq: Once | INTRAMUSCULAR | Status: AC
  Administered 2018-06-19: 30 mg via INTRAVENOUS
  Filled 2018-06-19: qty 1

## 2018-06-19 MED ORDER — METOCLOPRAMIDE HCL 5 MG/ML IJ SOLN
10.0000 mg | Freq: Three times a day (TID) | INTRAMUSCULAR | Status: DC
  Administered 2018-06-19: 10 mg via INTRAVENOUS
  Filled 2018-06-19 (×2): qty 2

## 2018-06-19 MED ORDER — ONDANSETRON 4 MG PO TBDP: 4.0000 mg | ORAL_TABLET | Freq: Two times a day (BID) | ORAL | Status: DC | PRN

## 2018-06-19 MED ORDER — AMLODIPINE BESYLATE 10 MG PO TABS
10.0000 mg | ORAL_TABLET | Freq: Every day | ORAL | Status: DC
  Filled 2018-06-19: qty 1

## 2018-06-19 MED ORDER — ONDANSETRON HCL 4 MG/2ML IJ SOLN
4.0000 mg | Freq: Four times a day (QID) | INTRAMUSCULAR | Status: DC | PRN
  Administered 2018-06-20: 4 mg via INTRAVENOUS
  Filled 2018-06-19: qty 2

## 2018-06-19 MED ORDER — LABETALOL HCL 5 MG/ML IV SOLN
20.0000 mg | Freq: Once | INTRAVENOUS | Status: AC
  Administered 2018-06-20: 20 mg via INTRAVENOUS
  Filled 2018-06-19: qty 4

## 2018-06-19 MED ORDER — LABETALOL HCL 200 MG PO TABS
300.0000 mg | ORAL_TABLET | ORAL | Status: DC
  Filled 2018-06-19: qty 1

## 2018-06-19 MED ORDER — CHLORHEXIDINE GLUCONATE CLOTH 2 % EX PADS
6.0000 | MEDICATED_PAD | Freq: Every day | CUTANEOUS | Status: DC
  Administered 2018-06-21: 6 via TOPICAL

## 2018-06-19 MED ORDER — LABETALOL HCL 5 MG/ML IV SOLN
20.0000 mg | Freq: Once | INTRAVENOUS | Status: AC
  Administered 2018-06-19: 20 mg via INTRAVENOUS
  Filled 2018-06-19: qty 4

## 2018-06-19 MED ORDER — FERRIC CITRATE 1 GM 210 MG(FE) PO TABS
420.0000 mg | ORAL_TABLET | Freq: Three times a day (TID) | ORAL | Status: DC
  Administered 2018-06-22 – 2018-06-24 (×7): 420 mg via ORAL
  Filled 2018-06-19 (×12): qty 2

## 2018-06-19 MED ORDER — DILTIAZEM HCL-DEXTROSE 100-5 MG/100ML-% IV SOLN (PREMIX)
5.0000 mg/h | INTRAVENOUS | Status: DC
  Administered 2018-06-19: 5 mg/h via INTRAVENOUS
  Filled 2018-06-19: qty 100

## 2018-06-19 MED ORDER — CARBAMAZEPINE 200 MG PO TABS
200.0000 mg | ORAL_TABLET | Freq: Every day | ORAL | Status: DC
  Filled 2018-06-19 (×2): qty 1

## 2018-06-19 MED ORDER — MORPHINE SULFATE (PF) 4 MG/ML IV SOLN
4.0000 mg | Freq: Once | INTRAVENOUS | Status: DC
  Filled 2018-06-19: qty 1

## 2018-06-19 MED ORDER — HEPARIN SODIUM (PORCINE) 5000 UNIT/ML IJ SOLN
5000.0000 [IU] | Freq: Three times a day (TID) | INTRAMUSCULAR | Status: DC
  Administered 2018-06-19 – 2018-06-24 (×13): 5000 [IU] via SUBCUTANEOUS
  Filled 2018-06-19 (×14): qty 1

## 2018-06-19 MED ORDER — LABETALOL HCL 200 MG PO TABS: 300.0000 mg | ORAL_TABLET | ORAL | Status: DC

## 2018-06-19 MED ORDER — LORAZEPAM 1 MG PO TABS
1.0000 mg | ORAL_TABLET | Freq: Once | ORAL | Status: AC
  Administered 2018-06-19: 1 mg via ORAL
  Filled 2018-06-19: qty 1

## 2018-06-19 MED ORDER — ONDANSETRON HCL 4 MG/2ML IJ SOLN
4.0000 mg | Freq: Once | INTRAMUSCULAR | Status: AC
  Administered 2018-06-19: 4 mg via INTRAVENOUS
  Filled 2018-06-19: qty 2

## 2018-06-19 MED ORDER — PROMETHAZINE HCL 25 MG/ML IJ SOLN: 12.5000 mg | Freq: Four times a day (QID) | INTRAMUSCULAR | Status: DC | PRN

## 2018-06-19 NOTE — ED Notes (Signed)
Attempted blood draw, unsuccessful. 

## 2018-06-19 NOTE — ED Notes (Signed)
ED TO INPATIENT HANDOFF REPORT  ED Nurse Name and Phone #: Judson Roch [phone]  S Name/Age/Gender Carrie [name] 38 y.o. female Room/Bed: 020C/020C  Code Status   Code Status: Prior  Home/SNF/Other Home Patient oriented to: self, place, time and situation Is this baseline? Yes   Triage Complete: Triage complete  Chief Complaint knee pain  Triage Note Pt was on the way to dialysis and tripped and fell outside. Denies hitting head. Fell onto left knee. Went into dialysis and finished 20 minutes of treatment before c/o 10/10 headache, SOB and 10/10 knee pain. EMS was called. Pt received 0.1,g clonidine at dialysis and was 3.3kg from dry weight   Allergies No Known Allergies  Level of Care/Admitting Diagnosis ED Disposition    ED Disposition Condition Comment   Admit  Hospital Area: [place] [[id number]]  Level of Care: Progressive [102]  Diagnosis: HHS (hypothenar hammer syndrome) [facility]) [[id number]]  Admitting Physician: Cresenciano Lick  Attending Physician: Cresenciano Lick  Estimated length of stay: 3 - 4 days  Certification:: I certify this patient will need inpatient services for at least 2 midnights  PT Class (Do Not Modify): Inpatient [101]  PT Acc Code (Do Not Modify): Private [1]       B Medical/Surgery History Past Medical History:  Diagnosis Date  . Anemia   . Diabetic neuropathy ([place])   . Diabetic retinopathy ([place])   . ESRD (end stage renal disease) on dialysis [facility])    "MWF; [place]" (02/02/2017)  . Gastroparesis   . HA (headache)    "qd if I didn't take my RX" (02/02/2017)  . Hemodialysis access site with arteriovenous graft ([place])   . Hypertension   . Neuropathy   . Renal insufficiency   . Trigeminal neuralgia   . Type I diabetes mellitus ([place])    Past Surgical History:  Procedure Laterality Date  . AV FISTULA PLACEMENT Left 09/07/2016   Procedure: Left arm 1st Stage Basilic AV Fistula;  Surgeon: [name], MD;   Location: [facility] OR;  Service: Vascular;  Laterality: Left;  . DILATION AND CURETTAGE OF UTERUS    . EXCISIONAL HEMORRHOIDECTOMY    . EYE SURGERY    . INSERTION OF DIALYSIS CATHETER Right 09/07/2016   Procedure: INSERTION OF DIALYSIS CATHETER;  Surgeon: [name], MD;  Location: [facility] OR;  Service: Vascular;  Laterality: Right;  . RETINAL LASER PROCEDURE Bilateral    "related to diabetes"  . REVISON OF ARTERIOVENOUS FISTULA Left 01/15/2017   Procedure: INSERTION OF  AV GORE-TEX GRAFT;  Surgeon: Elam Dutch, MD;  Location: [place];  Service: Vascular;  Laterality: Left;  . TEE WITHOUT CARDIOVERSION N/A 01/11/2017   Procedure: TRANSESOPHAGEAL ECHOCARDIOGRAM (TEE);  Surgeon: Jerline Pain, MD;  Location: [facility] ENDOSCOPY;  Service: Cardiovascular;  Laterality: N/A;     A IV Location/Drains/Wounds Patient Lines/Drains/Airways Status   Active Line/Drains/Airways    Name:   Placement date:   Placement time:   Site:   Days:   Peripheral IV 06/19/18 Left External jugular   06/19/18    1442    External jugular   less than 1   Fistula / Graft Left Forearm Arteriovenous fistula   09/07/16    0917    Forearm   650   Fistula / Graft Left Forearm Arteriovenous vein graft   01/15/17    1155    Forearm   520   Hemodialysis Catheter Right Internal jugular Double-lumen;Permanent   09/07/16  8469    Internal jugular   650   Incision (Closed) 09/07/16 Neck Right   09/07/16    0814     650   Incision (Closed) 09/07/16 Arm Left   09/07/16    0909     650   Incision (Closed) 01/15/17 Arm Left   01/15/17    1158     520   Incision (Closed) 01/15/17 Arm Left   01/15/17    1235     520          Intake/Output Last 24 hours No intake or output data in the 24 hours ending 06/19/18 1936  Labs/Imaging Results for orders placed or performed during the hospital encounter of 06/19/18 (from the past 48 hour(s))  CBG monitoring, ED     Status: Abnormal   Collection Time: 06/19/18  1:48 PM  Result Value Ref Range    Glucose-Capillary >600 (HH) 70 - 99 mg/dL  CBC with Differential     Status: Abnormal   Collection Time: 06/19/18  2:45 PM  Result Value Ref Range   WBC 5.1 4.0 - 10.5 K/uL   RBC 3.67 (L) 3.87 - 5.11 MIL/uL   Hemoglobin 11.3 (L) 12.0 - 15.0 g/dL   HCT 35.0 (L) 36.0 - 46.0 %   MCV 95.4 80.0 - 100.0 fL   MCH 30.8 26.0 - 34.0 pg   MCHC 32.3 30.0 - 36.0 g/dL   RDW 17.5 (H) 11.5 - 15.5 %   Platelets 135 (L) 150 - 400 K/uL   nRBC 0.0 0.0 - 0.2 %   Neutrophils Relative % 84 %   Neutro Abs 4.3 1.7 - 7.7 K/uL   Lymphocytes Relative 11 %   Lymphs Abs 0.5 (L) 0.7 - 4.0 K/uL   Monocytes Relative 4 %   Monocytes Absolute 0.2 0.1 - 1.0 K/uL   Eosinophils Relative 0 %   Eosinophils Absolute 0.0 0.0 - 0.5 K/uL   Basophils Relative 1 %   Basophils Absolute 0.1 0.0 - 0.1 K/uL   Immature Granulocytes 0 %   Abs Immature Granulocytes 0.02 0.00 - 0.07 K/uL    Comment: Performed at [facility], [address], [place], [place] [address]  Rapid HIV screen (HIV 1/2 Ab+Ag)     Status: None   Collection Time: 06/19/18  4:07 PM  Result Value Ref Range   HIV-1 P24 Antigen - HIV24 NON REACTIVE NON REACTIVE   HIV 1/2 Antibodies NON REACTIVE NON REACTIVE   Interpretation (HIV Ag Ab)      A non reactive test result means that HIV 1 or HIV 2 antibodies and HIV 1 p24 antigen were not detected in the specimen.    Comment: Performed at [facility], [place] [address], [place], [place] [address]  Comprehensive metabolic panel     Status: Abnormal   Collection Time: 06/19/18  4:20 PM  Result Value Ref Range   Sodium 127 (L) 135 - 145 mmol/L   Potassium 4.1 3.5 - 5.1 mmol/L   Chloride 87 (L) 98 - 111 mmol/L   CO2 22 22 - 32 mmol/L   Glucose, Bld 810 (HH) 70 - 99 mg/dL    Comment: CRITICAL RESULT CALLED TO, READ BACK BY AND VERIFIED WITH: S HENDERSON,RN 1642 06/19/2018 WBOND    BUN 43 (H) 6 - 20 mg/dL   Creatinine, Ser 9.56 (H) 0.44 - 1.00 mg/dL   Calcium 9.1 8.9 - 10.3 mg/dL   Total Protein 6.7  6.5 -  8.1 g/dL   Albumin 3.8 3.5 - 5.0 g/dL   AST 43 (H) 15 - 41 U/L   ALT 55 (H) 0 - 44 U/L   Alkaline Phosphatase 107 38 - 126 U/L   Total Bilirubin 1.2 0.3 - 1.2 mg/dL   GFR calc non Af Amer 5 (L) >60 mL/min   GFR calc Af Amer 6 (L) >60 mL/min   Anion gap 18 (H) 5 - 15    Comment: Performed at [facility] [address] [place], [place] [address]  Troponin I - Once     Status: Abnormal   Collection Time: 06/19/18  4:20 PM  Result Value Ref Range   Troponin I 0.06 (HH) <0.03 ng/mL    Comment: CRITICAL RESULT CALLED TO, READ BACK BY AND VERIFIED WITH: Ulyses Jarred 1700 06/19/2018 WBOND Performed at [facility], [place] [address], [place], [place] [address]   Lipase, blood     Status: None   Collection Time: 06/19/18  4:20 PM  Result Value Ref Range   Lipase 25 11 - 51 U/L    Comment: Performed at [facility] [address], [place], [place] [address]  I-Stat beta hCG blood, ED     Status: None   Collection Time: 06/19/18  5:10 PM  Result Value Ref Range   I-stat hCG, quantitative <5.0 <5 mIU/mL   Comment 3            Comment:   GEST. AGE      CONC.  (mIU/mL)   <=1 WEEK        5 - 50     2 WEEKS       50 - 500     3 WEEKS       100 - 10,000     4 WEEKS     1,000 - 30,000        FEMALE AND NON-PREGNANT FEMALE:     LESS THAN 5 mIU/mL   CBG monitoring, ED     Status: Abnormal   Collection Time: 06/19/18  6:12 PM  Result Value Ref Range   Glucose-Capillary >600 (HH) 70 - 99 mg/dL  CBG monitoring, ED     Status: Abnormal   Collection Time: 06/19/18  7:12 PM  Result Value Ref Range   Glucose-Capillary >600 (HH) 70 - 99 mg/dL   Dg Chest 1 View  Result Date: 06/19/2018 CLINICAL DATA:  Patient fell on weighted dialysis.  Dyspnea. EXAM: CHEST  1 VIEW COMPARISON:  05/28/2018 FINDINGS: Stable cardiomegaly. Nonaneurysmal thoracic aorta. Mild vascular congestion with low lung volumes. No pulmonary contusion, effusion or pneumothorax. No acute displaced rib  fractures. IMPRESSION: Cardiomegaly with low lung volumes.  Mild vascular congestion. Electronically Signed   By: Ashley Royalty M.D.   On: 06/19/2018 18:27   Ct Head Wo Contrast  Result Date: 06/19/2018 CLINICAL DATA:  38 year old female with headache and knee pain after slipping and falling on her way and dialysis earlier today. EXAM: CT HEAD WITHOUT CONTRAST CT CERVICAL SPINE WITHOUT CONTRAST TECHNIQUE: Multidetector CT imaging of the head and cervical spine was performed following the standard protocol without intravenous contrast. Multiplanar CT image reconstructions of the cervical spine were also generated. COMPARISON:  Prior brain MRI 11/21/2016; prior CT scan of the head 11/14/2013 FINDINGS: CT HEAD FINDINGS Brain: No evidence of acute infarction, hemorrhage, hydrocephalus, extra-axial collection or mass lesion/mass effect. Age advanced cerebral and cerebellar cortical atrophy. The cerebellar atrophy is predominantly midline and  inferior with mild hypoplasia of the vermis. 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 These findings are unchanged compared to prior imaging from August of 2018. Vascular: No hyperdense vessel or unexpected calcification. Skull: Normal. Negative for fracture or focal lesion. Sinuses/Orbits: Mucous retention cyst present within the left sphenoid air cell. Other: None. CT CERVICAL SPINE FINDINGS Alignment: Normal. Skull base and vertebrae: No acute fracture. No primary bone lesion or focal pathologic process. Soft tissues and spinal canal: No prevertebral fluid or swelling. No visible canal hematoma. Disc levels:  No level specific disease. Upper chest: Negative. Other: None IMPRESSION: CT HEAD 1. No acute intracranial abnormality. 2. Stable age advanced cerebral and cerebellar atrophy without significant interval progression compared to prior MRI dated 11/21/2016. CT CSPINE 1. No acute fracture or malalignment. Electronically Signed   By: Jacqulynn Cadet M.D.   On:  06/19/2018 16:03   Ct Cervical Spine Wo Contrast  Result Date: 06/19/2018 CLINICAL DATA:  38 year old female with headache and knee pain after slipping and falling on her way and dialysis earlier today. EXAM: CT HEAD WITHOUT CONTRAST CT CERVICAL SPINE WITHOUT CONTRAST TECHNIQUE: Multidetector CT imaging of the head and cervical spine was performed following the standard protocol without intravenous contrast. Multiplanar CT image reconstructions of the cervical spine were also generated. COMPARISON:  Prior brain MRI 11/21/2016; prior CT scan of the head 11/14/2013 FINDINGS: CT HEAD FINDINGS Brain: No evidence of acute infarction, hemorrhage, hydrocephalus, extra-axial collection or mass lesion/mass effect. Age advanced cerebral and cerebellar cortical atrophy. The cerebellar atrophy is predominantly midline and inferior with mild hypoplasia of the vermis. 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 These findings are unchanged compared to prior imaging from August of 2018. Vascular: No hyperdense vessel or unexpected calcification. Skull: Normal. Negative for fracture or focal lesion. Sinuses/Orbits: Mucous retention cyst present within the left sphenoid air cell. Other: None. CT CERVICAL SPINE FINDINGS Alignment: Normal. Skull base and vertebrae: No acute fracture. No primary bone lesion or focal pathologic process. Soft tissues and spinal canal: No prevertebral fluid or swelling. No visible canal hematoma. Disc levels:  No level specific disease. Upper chest: Negative. Other: None IMPRESSION: CT HEAD 1. No acute intracranial abnormality. 2. Stable age advanced cerebral and cerebellar atrophy without significant interval progression compared to prior MRI dated 11/21/2016. CT CSPINE 1. No acute fracture or malalignment. Electronically Signed   By: Jacqulynn Cadet M.D.   On: 06/19/2018 16:03   Dg Knee Complete 4 Views Right  Result Date: 06/19/2018 CLINICAL DATA:  Right knee pain after fall while  going to dialysis. EXAM: RIGHT KNEE - COMPLETE 4+ VIEW COMPARISON:  None. FINDINGS: No evidence of fracture, dislocation, or joint effusion. No evidence of arthropathy or other focal bone abnormality. Soft tissues are unremarkable. IMPRESSION: Negative. Electronically Signed   By: Ashley Royalty M.D.   On: 06/19/2018 18:30    Pending Labs Unresulted Labs (From admission, onward)    Start     Ordered   06/19/18 1929  Troponin I - Once  Once,   STAT     06/19/18 1928   06/19/18 1845  Blood gas, venous  Once,   STAT     06/19/18 1844   06/19/18 1607  Hepatitis panel, acute  (EXPOSURE PANEL)  ONCE - STAT,   STAT     06/19/18 1607   06/19/18 1331  Urine rapid drug screen (hosp performed)  ONCE - STAT,   STAT     06/19/18 1330   06/19/18  1247  Urinalysis, Routine w reflex microscopic  ONCE - STAT,   STAT     06/19/18 1253   Signed and Held  Basic metabolic panel  STAT Now then every 4 hours ,   STAT     Signed and Held   Signed and Held  CBC  (heparin)  Once,   R    Comments:  Baseline for heparin therapy IF NOT ALREADY DRAWN.  Notify MD if PLT < 100 K.    Signed and Held   Signed and Held  Creatinine, serum  (heparin)  Once,   R    Comments:  Baseline for heparin therapy IF NOT ALREADY DRAWN.    Signed and Held   Signed and Held  Beta-hydroxybutyric acid  Once,   R     Signed and Held   Signed and Held  Troponin I - Now Then Q6H  Now then every 6 hours,   R     Signed and Held   Signed and Held  Blood gas, arterial  Once,   R     Signed and Held   Signed and Held  Renal function panel  Once,   R     Signed and Held   Signed and Held  CBC  Once,   R     Signed and Held          Vitals/Pain Today's Vitals   06/19/18 1645 06/19/18 1845 06/19/18 1900 06/19/18 1915  BP: (!) 215/108 (!) 184/107 (!) 212/106 (!) 200/115  Pulse: 74 72 82 83  Resp: (!) 25 13 (!) 22 (!) 24  Temp:      SpO2: 93% 100% 100% 100%  Weight:      Height:      PainSc:        Isolation Precautions No  active isolations  Medications Medications  insulin regular, human (MYXREDLIN) 100 units/ 100 mL infusion (16.2 Units/hr Intravenous Rate/Dose Change 06/19/18 1919)  potassium chloride 10 mEq in 100 mL IVPB (10 mEq Intravenous New Bag/Given 06/19/18 1913)  Chlorhexidine Gluconate Cloth 2 % PADS 6 each (has no administration in time range)  ondansetron (ZOFRAN) injection 4 mg (4 mg Intravenous Given 06/19/18 1453)  LORazepam (ATIVAN) tablet 1 mg (1 mg Oral Given 06/19/18 1315)  ketorolac (TORADOL) 30 MG/ML injection 30 mg (30 mg Intravenous Given 06/19/18 1453)  HYDROmorphone (DILAUDID) injection 1 mg (1 mg Intravenous Given 06/19/18 1506)  labetalol (NORMODYNE,TRANDATE) injection 20 mg (20 mg Intravenous Given 06/19/18 1629)  sodium chloride 0.9 % bolus 250 mL (0 mLs Intravenous Stopped 06/19/18 1853)  hydrALAZINE (APRESOLINE) injection 10 mg (10 mg Intravenous Given 06/19/18 1806)    Mobility walks Low fall risk   Focused Assessments Neuro Assessment Handoff:  Swallow screen pass? Yes  Cardiac Rhythm: Normal sinus rhythm NIH Stroke Scale ( + Modified Stroke Scale Criteria)  Interval: Initial Level of Consciousness (1a.)   : Alert, keenly responsive LOC Questions (1b. )   +: Answers both questions correctly LOC Commands (1c. )   + : Performs both tasks correctly Best Gaze (2. )  +: Normal Visual (3. )  +: No visual loss Facial Palsy (4. )    : Normal symmetrical movements Motor Arm, Left (5a. )   +: No drift Motor Arm, Right (5b. )   +: No drift Motor Leg, Left (6a. )   +: No drift Motor Leg, Right (6b. )   +: No drift Limb Ataxia (7. ): Absent Sensory (8. )   +:  Normal, no sensory loss Best Language (9. )   +: No aphasia Dysarthria (10. ): Normal Extinction/Inattention (11.)   +: No Abnormality Modified SS Total  +: 0 Complete NIHSS TOTAL: 0     Neuro Assessment:   Neuro Checks:   Initial (06/19/18 1205)  Last Documented NIHSS Modified Score: 0 (06/19/18 1205) Has TPA been given?  No If patient is a Neuro Trauma and patient is going to OR before floor call report to [facility] nurse: [phone] or [phone]     R Recommendations: See Admitting Provider Note  Report given to:   Additional Notes: I/O cath yielded 0 ml, still no urine.

## 2018-06-19 NOTE — H&P (Addendum)
History and Physical    Carrie [name] ERD:[id number] DOB: 1980-06-06 DOA: 06/19/2018  PCP: Glendon Axe, MD  Patient coming from: HD Chief Complaint: Chest pain  HPI: Carrie [name] is a 38 y.o. female with medical history significant of DM1, ESRD on HD, HTN, gastroparesis.  She presents from HD today where it is reported that she developed CP and SOB after about 20 min of the session.  She was taken off of HD and then tripped and fell and hit her head.  She was then transported to the ED.  By the time I saw the patient, she had received ativan and dilaudid for a headache and she was not lucid.  She was also noted to have a very high blood sugar concerning for DKA/HHS.  History obtained from discussion with EDP and chart review.  The patients family reports headaches for the past few weeks.  She has also had stomach pain, nausea, vomiting and decreased PO intake due to her gastroparesis.  When I ask her, she notes that she no longer has chest pain and that her stomach hurts.  She is also retching in the ED, but not vomiting.  Her mother is not clear on her medications.  She notes that her BP has been running high lately and they have been changing her medications to help cope with this.    ED Course: in the ED, she was noted to have a very high glucose, AG of 18, bicarb of 22.  PH not yet obtained.  BUn of 43, Cr of 9.56, K of 4.1.  She was somnolent and confused.  Troponin was 0.06.  EKG showed SR with possible early inferolateral lead depression.  HCG negative.  CT head and cervical spine showed no acute findings.  CXR showed low lung volumes with mild congestion.  Xray of the knee was negative.   Review of Systems: As per HPI otherwise 10 point review of systems negative. Unable to get much of a review of systems, mother reports no recent fever or illness.  Just struggle with BP, nausea, vomiting and headaches.   Past Medical History:  Diagnosis Date  . Anemia   . Diabetic neuropathy ([place])   .  Diabetic retinopathy ([place])   . ESRD (end stage renal disease) on dialysis [facility])    "MWF; [place]" (02/02/2017)  . Gastroparesis   . HA (headache)    "qd if I didn't take my RX" (02/02/2017)  . Hemodialysis access site with arteriovenous graft ([place])   . Hypertension   . Neuropathy   . Renal insufficiency   . Trigeminal neuralgia   . Type I diabetes mellitus ([place])     Past Surgical History:  Procedure Laterality Date  . AV FISTULA PLACEMENT Left 09/07/2016   Procedure: Left arm 1st Stage Basilic AV Fistula;  Surgeon: [name], MD;  Location: [facility] OR;  Service: Vascular;  Laterality: Left;  . DILATION AND CURETTAGE OF UTERUS    . EXCISIONAL HEMORRHOIDECTOMY    . EYE SURGERY    . INSERTION OF DIALYSIS CATHETER Right 09/07/2016   Procedure: INSERTION OF DIALYSIS CATHETER;  Surgeon: [name], MD;  Location: [facility] OR;  Service: Vascular;  Laterality: Right;  . RETINAL LASER PROCEDURE Bilateral    "related to diabetes"  . REVISON OF ARTERIOVENOUS FISTULA Left 01/15/2017   Procedure: INSERTION OF  AV GORE-TEX GRAFT;  Surgeon: Elam Dutch, MD;  Location: [place];  Service: Vascular;  Laterality: Left;  . TEE WITHOUT  CARDIOVERSION N/A 01/11/2017   Procedure: TRANSESOPHAGEAL ECHOCARDIOGRAM (TEE);  Surgeon: Jerline Pain, MD;  Location: [facility] ENDOSCOPY;  Service: Cardiovascular;  Laterality: N/A;     reports that she has never smoked. She has never used smokeless tobacco. She reports current alcohol use of about 4.0 standard drinks of alcohol per week. She reports that she does not use drugs.  No Known Allergies  Reviewed with family.  Family History  Problem Relation Age of Onset  . Hyperlipidemia Mother   . Asthma Daughter   . Diabetes Maternal Grandmother   . Stroke Maternal Grandmother   . Kidney disease Maternal Grandfather   . Kidney disease Paternal Grandmother     This is the most accurate list we have at this time.  Pharmacy tech was not able to get a full list and  neither was I.  Will need to be compared to most recent nephrology notes.   Prior to Admission medications   Medication Sig Start Date End Date Taking? Authorizing Provider  amLODipine (NORVASC) 10 MG tablet Take 10 mg by mouth at bedtime. 10/01/17   [provider]  benzonatate (TESSALON) 100 MG capsule Take 2 capsules (200 mg total) by mouth every 8 (eight) hours. 05/18/18   Khatri, Hina, PA-C  carbamazepine (TEGRETOL) 200 MG tablet Take 1 tablet (200 mg total) by mouth at bedtime. 05/07/17   Annita Brod, MD  diphenhydramine-acetaminophen (TYLENOL PM) 25-500 MG TABS tablet Take 1 tablet by mouth at bedtime as needed (pain).    [provider]  ferric citrate (AURYXIA) 1 GM 210 MG(Fe) tablet Take 420 mg by mouth 3 (three) times daily with meals.     [provider]  gabapentin (NEURONTIN) 300 MG capsule Take 3 capsules (900 mg total) by mouth at bedtime. Patient taking differently: Take 300 mg by mouth at bedtime.  10/10/17   Hongalgi, Lenis Dickinson, MD  insulin aspart protamine - aspart (NOVOLOG MIX 70/30 FLEXPEN) (70-30) 100 UNIT/ML FlexPen Inject 0.15-0.25 mLs (15-25 Units total) into the skin See admin instructions. On non-dialysis days inj 25 units in the morning and 15 units at night. On dialysis days inj 15 units in the morning and 15 units at night. Patient taking differently: Inject 15 Units into the skin 3 (three) times daily with meals.  10/10/17   Hongalgi, Lenis Dickinson, MD  labetalol (NORMODYNE) 300 MG tablet Take 300 mg by mouth See admin instructions. Take one tablet (300 mg) by mouth on Monday, Wednesday, Friday - dialysis days - at bedtime, take one tablet (300 mg) by mouth twice daily on Sunday, Tuesday, Thursday, Saturday - non-dialysis days 08/25/16   [provider]  lisinopril (PRINIVIL,ZESTRIL) 20 MG tablet Take 20 mg by mouth at bedtime.     [provider]  Menthol-Methyl Salicylate (MUSCLE RUB) 10-15 % CREA Apply 1 application topically daily  as needed for muscle pain.    [provider]  metoCLOPramide (REGLAN) 10 MG tablet Take 1 tablet (10 mg total) by mouth 3 (three) times daily before meals. 05/28/18   Ward, Delice Bison, DO  ondansetron (ZOFRAN) 4 MG tablet Take 1 tablet (4 mg total) by mouth every 8 (eight) hours as needed for nausea or vomiting. Patient not taking: Reported on 05/18/2018 09/12/16   Rosita Fire, MD  ondansetron (ZOFRAN-ODT) 4 MG disintegrating tablet Take 4 mg by mouth 2 (two) times daily as needed for nausea or vomiting.  01/05/18   [provider]  Phenylephrine-DM-GG-APAP (TYLENOL COLD [place])  5-10-200-325 MG/15ML LIQD Take 15 mLs by mouth every 4 (four) hours as needed (cough/cold symptoms).    [provider]  pregabalin (LYRICA) 75 MG capsule Take 1 capsule (75 mg total) by mouth 2 (two) times daily. Patient taking differently: Take 75 mg by mouth at bedtime.  04/09/18   Pieter Partridge, DO  promethazine (PHENERGAN) 25 MG tablet Take 1 tablet (25 mg total) by mouth every 8 (eight) hours as needed for nausea or vomiting. 05/28/18   Ward, Delice Bison, DO    Physical Exam:  Constitutional: Sitting up in bed, eyes closed, mumbling, only answering some questions.  Vitals:   06/19/18 1630 06/19/18 1645 06/19/18 1845 06/19/18 1900  BP: (!) 216/113 (!) 215/108 (!) 184/107 (!) 212/106  Pulse: 79 74 72 82  Resp: 20 (!) 25 13 (!) 22  Temp:      SpO2: 100% 93% 100% 100%  Weight:      Height:       Eyes: Lids normal, would not open eyes ENMT: Mucous membranes are dry, neck is supple Neck: normal, supple, no masses Respiratory: MIld crackles at bases, otherwise clear, no wheezing, no increased work of breathing.  Cardiovascular: RR, NR, no murmur.  She has 1+ pitting edema to bilateral mid calves Abdomen: +BS, NT, ND Musculoskeletal: no clubbing / cyanosis. Normal muscle tone. She has an AVF in the left forearm with a good thrill Skin: no rashes, lesions, ulcers on exposed  skin Neurologic: SHe would not follow commands, would not open eyes.  She responded to her mothers voice at times.  Encephalopathic, will need to be repeated once she is better Psychiatric: encephalopathic  Labs on Admission: I have personally reviewed following labs and imaging studies  CBC: Recent Labs  Lab 06/19/18 1445  WBC 5.1  NEUTROABS 4.3  HGB 11.3*  HCT 35.0*  MCV 95.4  PLT 979*   Basic Metabolic Panel: Recent Labs  Lab 06/19/18 1620  NA 127*  K 4.1  CL 87*  CO2 22  GLUCOSE 810*  BUN 43*  CREATININE 9.56*  CALCIUM 9.1   GFR: Estimated Creatinine Clearance: 9.3 mL/min (A) (by C-G formula based on SCr of 9.56 mg/dL (H)). Liver Function Tests: Recent Labs  Lab 06/19/18 1620  AST 43*  ALT 55*  ALKPHOS 107  BILITOT 1.2  PROT 6.7  ALBUMIN 3.8   Recent Labs  Lab 06/19/18 1620  LIPASE 25   No results for input(s): AMMONIA in the last 168 hours. Coagulation Profile: No results for input(s): INR, PROTIME in the last 168 hours. Cardiac Enzymes: Recent Labs  Lab 06/19/18 1620  TROPONINI 0.06*   BNP (last 3 results) No results for input(s): PROBNP in the last 8760 hours. HbA1C: No results for input(s): HGBA1C in the last 72 hours. CBG: Recent Labs  Lab 06/19/18 1348 06/19/18 1812  GLUCAP >600* >600*   Lipid Profile: No results for input(s): CHOL, HDL, LDLCALC, TRIG, CHOLHDL, LDLDIRECT in the last 72 hours. Thyroid Function Tests: No results for input(s): TSH, T4TOTAL, FREET4, T3FREE, THYROIDAB in the last 72 hours. Anemia Panel: No results for input(s): VITAMINB12, FOLATE, FERRITIN, TIBC, IRON, RETICCTPCT in the last 72 hours. Urine analysis:    Component Value Date/Time   COLORURINE YELLOW 10/08/2017 1700   APPEARANCEUR HAZY (A) 10/08/2017 1700   LABSPEC 1.009 10/08/2017 1700   PHURINE 8.0 10/08/2017 1700   GLUCOSEU >=500 (A) 10/08/2017 1700   HGBUR SMALL (A) 10/08/2017 1700   HGBUR large 05/25/2009 0900   BILIRUBINUR  NEGATIVE  10/08/2017 1700   BILIRUBINUR neg 10/23/2012 1810   KETONESUR NEGATIVE 10/08/2017 1700   PROTEINUR >=300 (A) 10/08/2017 1700   UROBILINOGEN 0.2 04/16/2013 2155   NITRITE NEGATIVE 10/08/2017 1700   LEUKOCYTESUR LARGE (A) 10/08/2017 1700    Radiological Exams on Admission: Dg Chest 1 View  Result Date: 06/19/2018 CLINICAL DATA:  Patient fell on weighted dialysis.  Dyspnea. EXAM: CHEST  1 VIEW COMPARISON:  05/28/2018 FINDINGS: Stable cardiomegaly. Nonaneurysmal thoracic aorta. Mild vascular congestion with low lung volumes. No pulmonary contusion, effusion or pneumothorax. No acute displaced rib fractures. IMPRESSION: Cardiomegaly with low lung volumes.  Mild vascular congestion. Electronically Signed   By: Ashley Royalty M.D.   On: 06/19/2018 18:27   Ct Head Wo Contrast  Result Date: 06/19/2018 CLINICAL DATA:  38 year old female with headache and knee pain after slipping and falling on her way and dialysis earlier today. EXAM: CT HEAD WITHOUT CONTRAST CT CERVICAL SPINE WITHOUT CONTRAST TECHNIQUE: Multidetector CT imaging of the head and cervical spine was performed following the standard protocol without intravenous contrast. Multiplanar CT image reconstructions of the cervical spine were also generated. COMPARISON:  Prior brain MRI 11/21/2016; prior CT scan of the head 11/14/2013 FINDINGS: CT HEAD FINDINGS Brain: No evidence of acute infarction, hemorrhage, hydrocephalus, extra-axial collection or mass lesion/mass effect. Age advanced cerebral and cerebellar cortical atrophy. The cerebellar atrophy is predominantly midline and inferior with mild hypoplasia of the vermis. 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 These findings are unchanged compared to prior imaging from August of 2018. Vascular: No hyperdense vessel or unexpected calcification. Skull: Normal. Negative for fracture or focal lesion. Sinuses/Orbits: Mucous retention cyst present within the left sphenoid air cell. Other: None. CT  CERVICAL SPINE FINDINGS Alignment: Normal. Skull base and vertebrae: No acute fracture. No primary bone lesion or focal pathologic process. Soft tissues and spinal canal: No prevertebral fluid or swelling. No visible canal hematoma. Disc levels:  No level specific disease. Upper chest: Negative. Other: None IMPRESSION: CT HEAD 1. No acute intracranial abnormality. 2. Stable age advanced cerebral and cerebellar atrophy without significant interval progression compared to prior MRI dated 11/21/2016. CT CSPINE 1. No acute fracture or malalignment. Electronically Signed   By: Jacqulynn Cadet M.D.   On: 06/19/2018 16:03   Ct Cervical Spine Wo Contrast  Result Date: 06/19/2018 CLINICAL DATA:  38 year old female with headache and knee pain after slipping and falling on her way and dialysis earlier today. EXAM: CT HEAD WITHOUT CONTRAST CT CERVICAL SPINE WITHOUT CONTRAST TECHNIQUE: Multidetector CT imaging of the head and cervical spine was performed following the standard protocol without intravenous contrast. Multiplanar CT image reconstructions of the cervical spine were also generated. COMPARISON:  Prior brain MRI 11/21/2016; prior CT scan of the head 11/14/2013 FINDINGS: CT HEAD FINDINGS Brain: No evidence of acute infarction, hemorrhage, hydrocephalus, extra-axial collection or mass lesion/mass effect. Age advanced cerebral and cerebellar cortical atrophy. The cerebellar atrophy is predominantly midline and inferior with mild hypoplasia of the vermis. 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 These findings are unchanged compared to prior imaging from August of 2018. Vascular: No hyperdense vessel or unexpected calcification. Skull: Normal. Negative for fracture or focal lesion. Sinuses/Orbits: Mucous retention cyst present within the left sphenoid air cell. Other: None. CT CERVICAL SPINE FINDINGS Alignment: Normal. Skull base and vertebrae: No acute fracture. No primary bone lesion or focal pathologic  process. Soft tissues and spinal canal: No prevertebral fluid or swelling. No visible canal hematoma. Disc levels:  No level specific disease. Upper chest: Negative. Other: None IMPRESSION: CT HEAD 1. No acute intracranial abnormality. 2. Stable age advanced cerebral and cerebellar atrophy without significant interval progression compared to prior MRI dated 11/21/2016. CT CSPINE 1. No acute fracture or malalignment. Electronically Signed   By: Jacqulynn Cadet M.D.   On: 06/19/2018 16:03   Dg Knee Complete 4 Views Right  Result Date: 06/19/2018 CLINICAL DATA:  Right knee pain after fall while going to dialysis. EXAM: RIGHT KNEE - COMPLETE 4+ VIEW COMPARISON:  None. FINDINGS: No evidence of fracture, dislocation, or joint effusion. No evidence of arthropathy or other focal bone abnormality. Soft tissues are unremarkable. IMPRESSION: Negative. Electronically Signed   By: Ashley Royalty M.D.   On: 06/19/2018 18:30    EKG: Independently reviewed. Possible early ST depression in inferolateral leads.   Assessment/Plan DKA (diabetic ketoacidoses) vs. HHS - I favor HHS for this patient, though she is reported to have type 1 DM.  She is not severely acidotic based on her bicarb.  Will need a pH, VBG ordered.  Will request an ABG if possible - K 4.1.  Would not replete at this time given HD status, monitor with frequent BMET - BMET q 4hours until gap closes - Glucommander started, would avoid high doses of fluid in this patient, treatment will be complicated given HD status - Given long acting insulin once gap closes and glucose comes down - Monitor in progressive unit  Encephalopathy  - Medications vs. DKA/HHS vs. Hypertensive encephalopathy - Hold sedating medications including gabapentin and lyrica for now - Treat DKA/HHS as above - Treat Accelerated HTN - Continue tegretol as I would not want her to withdraw from that medication  Accelerated HTN - Discussed with nephrology - Start diltiazem drip  with goal SBP of 160 - Restart home amlodipine and labetalol, I am not sure if she is actually on lisinopril    Gastroparesis - IV reglan while she is nauseous - Zofran and promethazine for nausea    ESRD on hemodialysis - Nephrology consult - Did not complete HD today, may need HD to get BP under control    Chest pain - Likely type 2 NSTEMI, or ESRD related troponemia - She is now chest pain free - Cardiology consulted by the ED - Trend troponin - Telemetry - Repeat EKG    Nausea & vomiting - Zofran and promethazine   DVT prophylaxis: Heparin SQ  Code Status: Full  Family Communication: Mother at bedside  Disposition Plan: Admit to progressive unit for treatment of acute medical issues  Consults called: Dr. Augustin Coupe with nephrology, Cardiology by EDP  Admission status: INP, progressive    Gilles Chiquito MD [facility]   If 7PM-7AM, please contact night-coverage [email or web address]   06/19/2018, 7:05 PM

## 2018-06-19 NOTE — Consult Note (Addendum)
Reason for Consult: ESRD Referring Physician:  Dr. Gilles Chiquito  Chief Complaint:  Chest Pain  HD orders: EGB  MWF  T4hr  180NR EDW 84kg (last achieved 3/2) Heparin 3000 units bolus   Lt Cimino Calcitriol 1.75 PO TIW Aranesp 150 q2weeks (last given 2/19)  Assessment/Plan: 1. ESRD - She only received 23 min today, full treatment Monday but last Fri only 1:38hr treatment time. She was ~2.8kg over her EDW this AM and no UF achieved. - I am concerned of restarting dialysis today as she's confused and at times combative; with a fistula she would be at high risk for laceration. No absolute indication for dialysis tonight. Will plan on 1st shift in the AM. 2. Renal osteodystrophy  - Usually on Auryxia 1 tab TID w/ meals and last phos was 5.6 on 2/26. 3. Anemia - at goal. 4. HTN emergency - restart home meds (?absorption and will need to monitor if she is able to keep it down); already improving. Clevidipine or nitro gtt if necessary -> goal 30% decrease over the 1st 24 hrs to maintain cerebral perfusion. 5. DM/DKA - On insulin gtt - Will need to monitor the potassium but only being given 56meq and with the insulin there should be an intravascular shift. 6. Chest Pain - Appears to be mainly from the chronic cough over the past month but may also have a component of demand ischemia from the hypertension. 7. Headaches - likely from the hypertension; will evaluate closely as the BP is better controlled to determine if there is improvement.    HPI: Carrie [name] is an 38 y.o. female ESRD MWF EGB with Dr. Joelyn Oms and HTN DM with persistent cough for which she's been seen by a physician 2x already in the past month with Abx prescribed with no resolution. She was only on HD for 23 min today and sent to ED bec of right sided headaches and rt knee pain after a fall at dialysis. She also c/o dyspnea and when she got up + trip -. The fall. She has nausea from gastroparesis and by history diplopia and neck  pain but at the time of examination she was mainly c/o headaches. She was given Clonidine 0.1mg  at dialysis and in the ED has received hydralazine 10mg  IV, Labetolol 20mg  IV and started on insulin gtt with a blood glucose 810. She was confused at time of exam and much of the history was via her mother who was bedside.  ROS Pertinent items are noted in HPI.  Chemistry and CBC: Creat  Date/Time Value Ref Range Status  10/30/2012 07:06 PM 0.69 0.50 - 1.10 mg/dL Final  07/18/2012 07:18 PM 0.63 0.50 - 1.10 mg/dL Final   Creatinine, Ser  Date/Time Value Ref Range Status  06/19/2018 04:20 PM 9.56 (H) 0.44 - 1.00 mg/dL Final  05/27/2018 11:00 PM 7.80 (H) 0.44 - 1.00 mg/dL Final  05/18/2018 07:54 PM 10.84 (H) 0.44 - 1.00 mg/dL Final  05/18/2018 07:24 PM 11.30 (H) 0.44 - 1.00 mg/dL Final  04/26/2018 05:38 PM 4.11 (H) 0.44 - 1.00 mg/dL Final  10/10/2017 12:58 PM 8.75 (H) 0.44 - 1.00 mg/dL Final    Comment:    DIALYSIS  10/09/2017 05:14 AM 13.30 (H) 0.44 - 1.00 mg/dL Final  10/08/2017 03:35 PM 13.02 (H) 0.44 - 1.00 mg/dL Final  05/07/2017 11:14 AM 8.19 (H) 0.44 - 1.00 mg/dL Final  05/06/2017 09:45 AM 11.52 (H) 0.44 - 1.00 mg/dL Final  05/04/2017 03:17 PM 9.02 (H) 0.44 - 1.00  mg/dL Final  02/02/2017 06:54 PM 8.95 (H) 0.44 - 1.00 mg/dL Final  01/24/2017 04:28 AM 8.04 (H) 0.44 - 1.00 mg/dL Final  01/22/2017 03:40 AM 8.77 (H) 0.44 - 1.00 mg/dL Final  01/21/2017 03:01 AM 7.19 (H) 0.44 - 1.00 mg/dL Final  01/20/2017 06:48 AM 5.75 (H) 0.44 - 1.00 mg/dL Final  01/19/2017 04:34 AM 8.43 (H) 0.44 - 1.00 mg/dL Final  01/17/2017 10:53 AM 8.72 (H) 0.44 - 1.00 mg/dL Final  01/15/2017 08:52 PM 8.08 (H) 0.44 - 1.00 mg/dL Final  01/12/2017 04:59 AM 8.27 (H) 0.44 - 1.00 mg/dL Final  01/11/2017 01:30 PM 7.14 (H) 0.44 - 1.00 mg/dL Final  01/10/2017 02:33 AM 8.58 (H) 0.44 - 1.00 mg/dL Final  01/09/2017 02:09 PM 7.73 (H) 0.44 - 1.00 mg/dL Final  09/10/2016 07:09 AM 5.13 (H) 0.44 - 1.00 mg/dL Final  09/09/2016  05:03 AM 6.81 (H) 0.44 - 1.00 mg/dL Final  09/07/2016 11:10 AM 6.92 (H) 0.44 - 1.00 mg/dL Final  09/07/2016 04:08 AM 7.07 (H) 0.44 - 1.00 mg/dL Final  09/06/2016 06:14 AM 6.69 (H) 0.44 - 1.00 mg/dL Final  09/05/2016 07:05 AM 7.09 (H) 0.44 - 1.00 mg/dL Final  09/04/2016 09:06 PM 6.75 (H) 0.44 - 1.00 mg/dL Final  09/04/2016 12:43 PM 6.92 (H) 0.44 - 1.00 mg/dL Final  08/31/2016 10:31 AM 6.46 (H) 0.44 - 1.00 mg/dL Final  08/29/2016 06:10 PM 6.46 (H) 0.44 - 1.00 mg/dL Final  06/03/2015 10:14 AM 1.56 (H) 0.44 - 1.00 mg/dL Final  04/25/2015 05:13 PM 1.48 (H) 0.44 - 1.00 mg/dL Final  04/04/2015 05:00 AM 1.62 (H) 0.44 - 1.00 mg/dL Final  04/03/2015 10:42 AM 1.72 (H) 0.44 - 1.00 mg/dL Final  04/03/2015 06:49 AM 1.56 (H) 0.44 - 1.00 mg/dL Final  04/03/2015 04:06 AM 1.71 (H) 0.44 - 1.00 mg/dL Final  04/03/2015 12:49 AM 1.89 (H) 0.44 - 1.00 mg/dL Final  04/12/2014 06:22 PM 0.77 0.50 - 1.10 mg/dL Final  11/14/2013 10:50 AM 0.69 0.50 - 1.10 mg/dL Final  11/08/2013 07:27 AM 0.80 0.50 - 1.10 mg/dL Final  04/16/2013 09:50 PM 0.62 0.50 - 1.10 mg/dL Final  11/21/2012 06:02 PM 0.53 0.50 - 1.10 mg/dL Final  10/25/2012 12:35 PM 0.59 0.50 - 1.10 mg/dL Final  10/24/2012 10:43 PM 0.54 0.50 - 1.10 mg/dL Final  10/24/2012 01:15 PM 0.53 0.50 - 1.10 mg/dL Final  10/24/2012 08:53 AM 0.46 (L) 0.50 - 1.10 mg/dL Final  10/24/2012 05:15 AM 0.52 0.50 - 1.10 mg/dL Final  10/24/2012 02:42 AM 0.60 0.50 - 1.10 mg/dL Final  10/23/2012 08:35 PM 0.60 0.50 - 1.10 mg/dL Final   Recent Labs  Lab 06/19/18 1620  NA 127*  K 4.1  CL 87*  CO2 22  GLUCOSE 810*  BUN 43*  CREATININE 9.56*  CALCIUM 9.1   Recent Labs  Lab 06/19/18 1445  WBC 5.1  NEUTROABS 4.3  HGB 11.3*  HCT 35.0*  MCV 95.4  PLT 135*   Liver Function Tests: Recent Labs  Lab 06/19/18 1620  AST 43*  ALT 55*  ALKPHOS 107  BILITOT 1.2  PROT 6.7  ALBUMIN 3.8   Recent Labs  Lab 06/19/18 1620  LIPASE 25   No results for input(s): AMMONIA in the  last 168 hours. Cardiac Enzymes: Recent Labs  Lab 06/19/18 1620  TROPONINI 0.06*   Iron Studies: No results for input(s): IRON, TIBC, TRANSFERRIN, FERRITIN in the last 72 hours. PT/INR: @LABRCNTIP (inr:5)  Xrays/Other Studies: ) Results for orders placed or performed during the hospital encounter of 06/19/18 (  from the past 48 hour(s))  CBG monitoring, ED     Status: Abnormal   Collection Time: 06/19/18  1:48 PM  Result Value Ref Range   Glucose-Capillary >600 (HH) 70 - 99 mg/dL  CBC with Differential     Status: Abnormal   Collection Time: 06/19/18  2:45 PM  Result Value Ref Range   WBC 5.1 4.0 - 10.5 K/uL   RBC 3.67 (L) 3.87 - 5.11 MIL/uL   Hemoglobin 11.3 (L) 12.0 - 15.0 g/dL   HCT 35.0 (L) 36.0 - 46.0 %   MCV 95.4 80.0 - 100.0 fL   MCH 30.8 26.0 - 34.0 pg   MCHC 32.3 30.0 - 36.0 g/dL   RDW 17.5 (H) 11.5 - 15.5 %   Platelets 135 (L) 150 - 400 K/uL   nRBC 0.0 0.0 - 0.2 %   Neutrophils Relative % 84 %   Neutro Abs 4.3 1.7 - 7.7 K/uL   Lymphocytes Relative 11 %   Lymphs Abs 0.5 (L) 0.7 - 4.0 K/uL   Monocytes Relative 4 %   Monocytes Absolute 0.2 0.1 - 1.0 K/uL   Eosinophils Relative 0 %   Eosinophils Absolute 0.0 0.0 - 0.5 K/uL   Basophils Relative 1 %   Basophils Absolute 0.1 0.0 - 0.1 K/uL   Immature Granulocytes 0 %   Abs Immature Granulocytes 0.02 0.00 - 0.07 K/uL    Comment: Performed at [facility], [address], [place], [place] [address]  Rapid HIV screen (HIV 1/2 Ab+Ag)     Status: None   Collection Time: 06/19/18  4:07 PM  Result Value Ref Range   HIV-1 P24 Antigen - HIV24 NON REACTIVE NON REACTIVE   HIV 1/2 Antibodies NON REACTIVE NON REACTIVE   Interpretation (HIV Ag Ab)      A non reactive test result means that HIV 1 or HIV 2 antibodies and HIV 1 p24 antigen were not detected in the specimen.    Comment: Performed at [facility], [place] [address] [place], [place] [address]  Comprehensive metabolic panel     Status: Abnormal   Collection  Time: 06/19/18  4:20 PM  Result Value Ref Range   Sodium 127 (L) 135 - 145 mmol/L   Potassium 4.1 3.5 - 5.1 mmol/L   Chloride 87 (L) 98 - 111 mmol/L   CO2 22 22 - 32 mmol/L   Glucose, Bld 810 (HH) 70 - 99 mg/dL    Comment: CRITICAL RESULT CALLED TO, READ BACK BY AND VERIFIED WITH: S HENDERSON,RN 1642 06/19/2018 WBOND    BUN 43 (H) 6 - 20 mg/dL   Creatinine, Ser 9.56 (H) 0.44 - 1.00 mg/dL   Calcium 9.1 8.9 - 10.3 mg/dL   Total Protein 6.7 6.5 - 8.1 g/dL   Albumin 3.8 3.5 - 5.0 g/dL   AST 43 (H) 15 - 41 U/L   ALT 55 (H) 0 - 44 U/L   Alkaline Phosphatase 107 38 - 126 U/L   Total Bilirubin 1.2 0.3 - 1.2 mg/dL   GFR calc non Af Amer 5 (L) >60 mL/min   GFR calc Af Amer 6 (L) >60 mL/min   Anion gap 18 (H) 5 - 15    Comment: Performed at [facility] [address] [place], [place] [address]  Troponin I - Once     Status: Abnormal   Collection Time: 06/19/18  4:20 PM  Result Value Ref Range   Troponin I 0.06 (HH) <0.03 ng/mL  Comment: CRITICAL RESULT CALLED TO, READ BACK BY AND VERIFIED WITH: Ulyses Jarred 1700 06/19/2018 WBOND Performed at [facility], [place] [address], [place], [place] [address]   Lipase, blood     Status: None   Collection Time: 06/19/18  4:20 PM  Result Value Ref Range   Lipase 25 11 - 51 U/L    Comment: Performed at [facility] [address], [place], [place] [address]  I-Stat beta hCG blood, ED     Status: None   Collection Time: 06/19/18  5:10 PM  Result Value Ref Range   I-stat hCG, quantitative <5.0 <5 mIU/mL   Comment 3            Comment:   GEST. AGE      CONC.  (mIU/mL)   <=1 WEEK        5 - 50     2 WEEKS       50 - 500     3 WEEKS       100 - 10,000     4 WEEKS     1,000 - 30,000        FEMALE AND NON-PREGNANT FEMALE:     LESS THAN 5 mIU/mL   CBG monitoring, ED     Status: Abnormal   Collection Time: 06/19/18  6:12 PM  Result Value Ref Range   Glucose-Capillary >600 (HH) 70 - 99 mg/dL   Dg Chest 1 View  Result  Date: 06/19/2018 CLINICAL DATA:  Patient fell on weighted dialysis.  Dyspnea. EXAM: CHEST  1 VIEW COMPARISON:  05/28/2018 FINDINGS: Stable cardiomegaly. Nonaneurysmal thoracic aorta. Mild vascular congestion with low lung volumes. No pulmonary contusion, effusion or pneumothorax. No acute displaced rib fractures. IMPRESSION: Cardiomegaly with low lung volumes.  Mild vascular congestion. Electronically Signed   By: Ashley Royalty M.D.   On: 06/19/2018 18:27   Ct Head Wo Contrast  Result Date: 06/19/2018 CLINICAL DATA:  38 year old female with headache and knee pain after slipping and falling on her way and dialysis earlier today. EXAM: CT HEAD WITHOUT CONTRAST CT CERVICAL SPINE WITHOUT CONTRAST TECHNIQUE: Multidetector CT imaging of the head and cervical spine was performed following the standard protocol without intravenous contrast. Multiplanar CT image reconstructions of the cervical spine were also generated. COMPARISON:  Prior brain MRI 11/21/2016; prior CT scan of the head 11/14/2013 FINDINGS: CT HEAD FINDINGS Brain: No evidence of acute infarction, hemorrhage, hydrocephalus, extra-axial collection or mass lesion/mass effect. Age advanced cerebral and cerebellar cortical atrophy. The cerebellar atrophy is predominantly midline and inferior with mild hypoplasia of the vermis. 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 These findings are unchanged compared to prior imaging from August of 2018. Vascular: No hyperdense vessel or unexpected calcification. Skull: Normal. Negative for fracture or focal lesion. Sinuses/Orbits: Mucous retention cyst present within the left sphenoid air cell. Other: None. CT CERVICAL SPINE FINDINGS Alignment: Normal. Skull base and vertebrae: No acute fracture. No primary bone lesion or focal pathologic process. Soft tissues and spinal canal: No prevertebral fluid or swelling. No visible canal hematoma. Disc levels:  No level specific disease. Upper chest: Negative. Other: None  IMPRESSION: CT HEAD 1. No acute intracranial abnormality. 2. Stable age advanced cerebral and cerebellar atrophy without significant interval progression compared to prior MRI dated 11/21/2016. CT CSPINE 1. No acute fracture or malalignment. Electronically Signed   By: Jacqulynn Cadet M.D.   On: 06/19/2018 16:03   Ct Cervical Spine Wo Contrast  Result  Date: 06/19/2018 CLINICAL DATA:  38 year old female with headache and knee pain after slipping and falling on her way and dialysis earlier today. EXAM: CT HEAD WITHOUT CONTRAST CT CERVICAL SPINE WITHOUT CONTRAST TECHNIQUE: Multidetector CT imaging of the head and cervical spine was performed following the standard protocol without intravenous contrast. Multiplanar CT image reconstructions of the cervical spine were also generated. COMPARISON:  Prior brain MRI 11/21/2016; prior CT scan of the head 11/14/2013 FINDINGS: CT HEAD FINDINGS Brain: No evidence of acute infarction, hemorrhage, hydrocephalus, extra-axial collection or mass lesion/mass effect. Age advanced cerebral and cerebellar cortical atrophy. The cerebellar atrophy is predominantly midline and inferior with mild hypoplasia of the vermis. 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 These findings are unchanged compared to prior imaging from August of 2018. Vascular: No hyperdense vessel or unexpected calcification. Skull: Normal. Negative for fracture or focal lesion. Sinuses/Orbits: Mucous retention cyst present within the left sphenoid air cell. Other: None. CT CERVICAL SPINE FINDINGS Alignment: Normal. Skull base and vertebrae: No acute fracture. No primary bone lesion or focal pathologic process. Soft tissues and spinal canal: No prevertebral fluid or swelling. No visible canal hematoma. Disc levels:  No level specific disease. Upper chest: Negative. Other: None IMPRESSION: CT HEAD 1. No acute intracranial abnormality. 2. Stable age advanced cerebral and cerebellar atrophy without  significant interval progression compared to prior MRI dated 11/21/2016. CT CSPINE 1. No acute fracture or malalignment. Electronically Signed   By: Jacqulynn Cadet M.D.   On: 06/19/2018 16:03   Dg Knee Complete 4 Views Right  Result Date: 06/19/2018 CLINICAL DATA:  Right knee pain after fall while going to dialysis. EXAM: RIGHT KNEE - COMPLETE 4+ VIEW COMPARISON:  None. FINDINGS: No evidence of fracture, dislocation, or joint effusion. No evidence of arthropathy or other focal bone abnormality. Soft tissues are unremarkable. IMPRESSION: Negative. Electronically Signed   By: Ashley Royalty M.D.   On: 06/19/2018 18:30    PMH:   Past Medical History:  Diagnosis Date  . Anemia   . Diabetic neuropathy ([facility])   . Diabetic retinopathy ([employer])   . ESRD (end stage renal disease) on dialysis [facility])    "MWF; [place]" (02/02/2017)  . Gastroparesis   . HA (headache)    "qd if I didn't take my RX" (02/02/2017)  . Hemodialysis access site with arteriovenous graft ([facility])   . Hypertension   . Neuropathy   . Renal insufficiency   . Trigeminal neuralgia   . Type I diabetes mellitus ([facility])     PSH:   Past Surgical History:  Procedure Laterality Date  . AV FISTULA PLACEMENT Left 09/07/2016   Procedure: Left arm 1st Stage Basilic AV Fistula;  Surgeon: [name], MD;  Location: [facility] OR;  Service: Vascular;  Laterality: Left;  . DILATION AND CURETTAGE OF UTERUS    . EXCISIONAL HEMORRHOIDECTOMY    . EYE SURGERY    . INSERTION OF DIALYSIS CATHETER Right 09/07/2016   Procedure: INSERTION OF DIALYSIS CATHETER;  Surgeon: [name], MD;  Location: [facility] OR;  Service: Vascular;  Laterality: Right;  . RETINAL LASER PROCEDURE Bilateral    "related to diabetes"  . REVISON OF ARTERIOVENOUS FISTULA Left 01/15/2017   Procedure: INSERTION OF  AV GORE-TEX GRAFT;  Surgeon: Elam Dutch, MD;  Location: [facility];  Service: Vascular;  Laterality: Left;  . TEE WITHOUT CARDIOVERSION N/A 01/11/2017   Procedure:  TRANSESOPHAGEAL ECHOCARDIOGRAM (TEE);  Surgeon: Jerline Pain, MD;  Location: [facility] ENDOSCOPY;  Service: Cardiovascular;  Laterality: N/A;    Allergies: No Known Allergies  Medications:   Prior to Admission medications   Medication Sig Start Date End Date Taking? Authorizing Provider  amLODipine (NORVASC) 10 MG tablet Take 10 mg by mouth at bedtime. 10/01/17   [provider]  benzonatate (TESSALON) 100 MG capsule Take 2 capsules (200 mg total) by mouth every 8 (eight) hours. 05/18/18   Khatri, Hina, PA-C  carbamazepine (TEGRETOL) 200 MG tablet Take 1 tablet (200 mg total) by mouth at bedtime. 05/07/17   Annita Brod, MD  diphenhydramine-acetaminophen (TYLENOL PM) 25-500 MG TABS tablet Take 1 tablet by mouth at bedtime as needed (pain).    [provider]  ferric citrate (AURYXIA) 1 GM 210 MG(Fe) tablet Take 420 mg by mouth 3 (three) times daily with meals.     [provider]  gabapentin (NEURONTIN) 300 MG capsule Take 3 capsules (900 mg total) by mouth at bedtime. Patient taking differently: Take 300 mg by mouth at bedtime.  10/10/17   Hongalgi, Lenis Dickinson, MD  insulin aspart protamine - aspart (NOVOLOG MIX 70/30 FLEXPEN) (70-30) 100 UNIT/ML FlexPen Inject 0.15-0.25 mLs (15-25 Units total) into the skin See admin instructions. On non-dialysis days inj 25 units in the morning and 15 units at night. On dialysis days inj 15 units in the morning and 15 units at night. Patient taking differently: Inject 15 Units into the skin 3 (three) times daily with meals.  10/10/17   Hongalgi, Lenis Dickinson, MD  labetalol (NORMODYNE) 300 MG tablet Take 300 mg by mouth See admin instructions. Take one tablet (300 mg) by mouth on Monday, Wednesday, Friday - dialysis days - at bedtime, take one tablet (300 mg) by mouth twice daily on Sunday, Tuesday, Thursday, Saturday - non-dialysis days 08/25/16   [provider]  lisinopril (PRINIVIL,ZESTRIL) 20 MG tablet Take 20 mg by mouth at bedtime.      [provider]  Menthol-Methyl Salicylate (MUSCLE RUB) 10-15 % CREA Apply 1 application topically daily as needed for muscle pain.    [provider]  metoCLOPramide (REGLAN) 10 MG tablet Take 1 tablet (10 mg total) by mouth 3 (three) times daily before meals. 05/28/18   Ward, Delice Bison, DO  ondansetron (ZOFRAN) 4 MG tablet Take 1 tablet (4 mg total) by mouth every 8 (eight) hours as needed for nausea or vomiting. Patient not taking: Reported on 05/18/2018 09/12/16   Rosita Fire, MD  ondansetron (ZOFRAN-ODT) 4 MG disintegrating tablet Take 4 mg by mouth 2 (two) times daily as needed for nausea or vomiting.  01/05/18   [provider]  Phenylephrine-DM-GG-APAP (TYLENOL COLD MULTI-SYMPTOM DAY) 5-10-200-325 MG/15ML LIQD Take 15 mLs by mouth every 4 (four) hours as needed (cough/cold symptoms).    [provider]  pregabalin (LYRICA) 75 MG capsule Take 1 capsule (75 mg total) by mouth 2 (two) times daily. Patient taking differently: Take 75 mg by mouth at bedtime.  04/09/18   Pieter Partridge, DO  promethazine (PHENERGAN) 25 MG tablet Take 1 tablet (25 mg total) by mouth every 8 (eight) hours as needed for nausea or vomiting. 05/28/18   Ward, Delice Bison, DO    Discontinued Meds:   Medications Discontinued During This Encounter  Medication Reason  . morphine 4 MG/ML injection 4 mg     Social History:  reports that she has never smoked. She has never used smokeless tobacco. She reports current alcohol use of about 4.0 standard drinks of alcohol per week. She  reports that she does not use drugs.  Family History:   Family History  Problem Relation Age of Onset  . Hyperlipidemia Mother   . Asthma Daughter   . Diabetes Maternal Grandmother   . Stroke Maternal Grandmother   . Kidney disease Maternal Grandfather   . Kidney disease Paternal Grandmother     Blood pressure (!) 184/107, pulse 72, temperature 98.4 F (36.9 C), resp. rate 13, height 5\' 8"  (1.727 m),  weight 84 kg, last menstrual period 07/16/2016, SpO2 100 %. General appearance: combative Head: Normocephalic, without obvious abnormality, atraumatic Eyes: negative Neck: no adenopathy, no carotid bruit, no JVD, supple, symmetrical, trachea midline and thyroid not enlarged, symmetric, no tenderness/mass/nodules Back: symmetric, no curvature. ROM normal. No CVA tenderness. Resp: clear to auscultation bilaterally Chest wall: no tenderness Cardio: regular rate and rhythm, S1, S2 normal, no murmur, click, rub or gallop GI: soft, non-tender; bowel sounds normal; no masses,  no organomegaly Extremities: edema 1+ Pulses: 2+ and symmetric Skin: Skin color, texture, turgor normal. No rashes or lesions Lymph nodes: Cervical, supraclavicular, and axillary nodes normal. Neurologic: Mental status: Confused Access: good bruit in left Edmund Hilda, MD 06/19/2018, 6:59 PM

## 2018-06-19 NOTE — ED Provider Notes (Signed)
Carrie [name] is a 38 y.o. female, presenting to the ED with a reported complaint of chest pain and shortness of breath.   HPI from [place], PA-C: "OSWIN JOHAL is a 38 y.o. female with PMHx EDRS on dialysis M, W, F, HTN, and diabetes.    Pt presents to the ED via ambulance from dialysis complaining of right sided headache and right knee pain s/p ground level fall that occurred at dialysis. Pt states that she began feeling SOB during dialysis and had to stop after 20 minutes; she then got up and tripped, hitting her head and knee on the ground. Since then she has been having a diffuse headache. She also endorses nausea, double vision, and neck pain. BP in triage is 222/105; states she is compliant with medication. Pt was given 0.1 mg Clonidine at dialysis. Denies abdominal pain, vomiting, diarrhea, constipation, weakness, numbness, or any other associated symptoms."  Past Medical History:  Diagnosis Date  . Anemia   . Diabetic neuropathy ([place])   . Diabetic retinopathy ([place])   . ESRD (end stage renal disease) on dialysis [facility])    "MWF; [place]" (02/02/2017)  . Gastroparesis   . HA (headache)    "qd if I didn't take my RX" (02/02/2017)  . Hemodialysis access site with arteriovenous graft ([place])   . Hypertension   . Neuropathy   . Renal insufficiency   . Trigeminal neuralgia   . Type I diabetes mellitus ([facility])      Physical Exam  BP (!) 222/105 (BP Location: Right Arm)   Pulse 88   Temp 98.4 F (36.9 C)   Resp 18   Ht 5\' 8"  (1.727 m)   Wt 84 kg   LMP 07/16/2016   SpO2 98%   BMI 28.16 kg/m   Physical Exam Vitals signs and nursing note reviewed.  Constitutional:      General: She is not in acute distress.    Appearance: She is well-developed. She is not diaphoretic.     Comments: Somnolent  HENT:     Head: Normocephalic and atraumatic.     Mouth/Throat:     Mouth: Mucous membranes are moist.     Pharynx: Oropharynx is clear.  Eyes:     Conjunctiva/sclera:  Conjunctivae normal.  Neck:     Musculoskeletal: Neck supple.  Cardiovascular:     Rate and Rhythm: Normal rate and regular rhythm.     Pulses: Normal pulses.          Radial pulses are 2+ on the right side and 2+ on the left side.       Posterior tibial pulses are 2+ on the right side and 2+ on the left side.     Heart sounds: Normal heart sounds.     Comments: Tactile temperature in the extremities appropriate and equal bilaterally. Pulmonary:     Effort: Pulmonary effort is normal. No respiratory distress.     Breath sounds: Normal breath sounds.     Comments: No increased work of breathing.  Patient sits up to allow pulmonary exam. Abdominal:     Palpations: Abdomen is soft.     Tenderness: There is no abdominal tenderness. There is no guarding.  Musculoskeletal:     Right lower leg: No edema.     Left lower leg: No edema.  Lymphadenopathy:     Cervical: No cervical adenopathy.  Skin:    General: Skin is warm and dry.     Capillary Refill: Capillary refill takes less  than 2 seconds.  Neurological:     Comments: Patient will open her eyes to voice and follow some commands.  She is noted to move each of her extremities.  Psychiatric:        Mood and Affect: Mood and affect normal.        Speech: Speech normal.        Behavior: Behavior normal.     ED Course/Procedures     .Critical Care Performed by: Lorayne Bender, PA-C Authorized by: Lorayne Bender, PA-C   Critical care provider statement:    Critical care time (minutes):  45   Critical care time was exclusive of:  Separately billable procedures and treating other patients   Critical care was necessary to treat or prevent imminent or life-threatening deterioration of the following conditions:  Endocrine crisis (Hypertensive urgency)   Critical care was time spent personally by me on the following activities:  Development of treatment plan with patient or surrogate, discussions with consultants, evaluation of patient's  response to treatment, examination of patient, obtaining history from patient or surrogate, review of old charts, re-evaluation of patient's condition, pulse oximetry, ordering and performing treatments and interventions and ordering and review of laboratory studies   I assumed direction of critical care for this patient from another provider in my specialty: yes       Abnormal Labs Reviewed  COMPREHENSIVE METABOLIC PANEL - Abnormal; Notable for the following components:      Result Value   Sodium 127 (*)    Chloride 87 (*)    Glucose, Bld 810 (*)    BUN 43 (*)    Creatinine, Ser 9.56 (*)    AST 43 (*)    ALT 55 (*)    GFR calc non Af Amer 5 (*)    GFR calc Af Amer 6 (*)    Anion gap 18 (*)    All other components within normal limits  CBC WITH DIFFERENTIAL/PLATELET - Abnormal; Notable for the following components:   RBC 3.67 (*)    Hemoglobin 11.3 (*)    HCT 35.0 (*)    RDW 17.5 (*)    Platelets 135 (*)    Lymphs Abs 0.5 (*)    All other components within normal limits  TROPONIN I - Abnormal; Notable for the following components:   Troponin I 0.06 (*)    All other components within normal limits  CBG MONITORING, ED - Abnormal; Notable for the following components:   Glucose-Capillary >600 (*)    All other components within normal limits  CBG MONITORING, ED - Abnormal; Notable for the following components:   Glucose-Capillary >600 (*)    All other components within normal limits    Dg Chest 1 View  Result Date: 06/19/2018 CLINICAL DATA:  Patient fell on weighted dialysis.  Dyspnea. EXAM: CHEST  1 VIEW COMPARISON:  05/28/2018 FINDINGS: Stable cardiomegaly. Nonaneurysmal thoracic aorta. Mild vascular congestion with low lung volumes. No pulmonary contusion, effusion or pneumothorax. No acute displaced rib fractures. IMPRESSION: Cardiomegaly with low lung volumes.  Mild vascular congestion. Electronically Signed   By: Ashley Royalty M.D.   On: 06/19/2018 18:27   Ct Head Wo  Contrast  Result Date: 06/19/2018 CLINICAL DATA:  38 year old female with headache and knee pain after slipping and falling on her way and dialysis earlier today. EXAM: CT HEAD WITHOUT CONTRAST CT CERVICAL SPINE WITHOUT CONTRAST TECHNIQUE: Multidetector CT imaging of the head and cervical spine was performed following the standard protocol without  intravenous contrast. Multiplanar CT image reconstructions of the cervical spine were also generated. COMPARISON:  Prior brain MRI 11/21/2016; prior CT scan of the head 11/14/2013 FINDINGS: CT HEAD FINDINGS Brain: No evidence of acute infarction, hemorrhage, hydrocephalus, extra-axial collection or mass lesion/mass effect. Age advanced cerebral and cerebellar cortical atrophy. The cerebellar atrophy is predominantly midline and inferior with mild hypoplasia of the vermis. 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 These findings are unchanged compared to prior imaging from August of 2018. Vascular: No hyperdense vessel or unexpected calcification. Skull: Normal. Negative for fracture or focal lesion. Sinuses/Orbits: Mucous retention cyst present within the left sphenoid air cell. Other: None. CT CERVICAL SPINE FINDINGS Alignment: Normal. Skull base and vertebrae: No acute fracture. No primary bone lesion or focal pathologic process. Soft tissues and spinal canal: No prevertebral fluid or swelling. No visible canal hematoma. Disc levels:  No level specific disease. Upper chest: Negative. Other: None IMPRESSION: CT HEAD 1. No acute intracranial abnormality. 2. Stable age advanced cerebral and cerebellar atrophy without significant interval progression compared to prior MRI dated 11/21/2016. CT CSPINE 1. No acute fracture or malalignment. Electronically Signed   By: Jacqulynn Cadet M.D.   On: 06/19/2018 16:03   Ct Cervical Spine Wo Contrast  Result Date: 06/19/2018 CLINICAL DATA:  38 year old female with headache and knee pain after slipping and falling on  her way and dialysis earlier today. EXAM: CT HEAD WITHOUT CONTRAST CT CERVICAL SPINE WITHOUT CONTRAST TECHNIQUE: Multidetector CT imaging of the head and cervical spine was performed following the standard protocol without intravenous contrast. Multiplanar CT image reconstructions of the cervical spine were also generated. COMPARISON:  Prior brain MRI 11/21/2016; prior CT scan of the head 11/14/2013 FINDINGS: CT HEAD FINDINGS Brain: No evidence of acute infarction, hemorrhage, hydrocephalus, extra-axial collection or mass lesion/mass effect. Age advanced cerebral and cerebellar cortical atrophy. The cerebellar atrophy is predominantly midline and inferior with mild hypoplasia of the vermis. 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 These findings are unchanged compared to prior imaging from August of 2018. Vascular: No hyperdense vessel or unexpected calcification. Skull: Normal. Negative for fracture or focal lesion. Sinuses/Orbits: Mucous retention cyst present within the left sphenoid air cell. Other: None. CT CERVICAL SPINE FINDINGS Alignment: Normal. Skull base and vertebrae: No acute fracture. No primary bone lesion or focal pathologic process. Soft tissues and spinal canal: No prevertebral fluid or swelling. No visible canal hematoma. Disc levels:  No level specific disease. Upper chest: Negative. Other: None IMPRESSION: CT HEAD 1. No acute intracranial abnormality. 2. Stable age advanced cerebral and cerebellar atrophy without significant interval progression compared to prior MRI dated 11/21/2016. CT CSPINE 1. No acute fracture or malalignment. Electronically Signed   By: Jacqulynn Cadet M.D.   On: 06/19/2018 16:03   Dg Chest Portable 1 View  Result Date: 05/28/2018 CLINICAL DATA:  Flu-like symptoms EXAM: PORTABLE CHEST 1 VIEW COMPARISON:  05/18/2018 FINDINGS: Cardiac shadow is enlarged but stable. The lungs are well aerated bilaterally. No focal infiltrate or sizable effusion is seen.  Minimal scarring is noted in the left mid lung stable from the prior exam. IMPRESSION: Stable cardiomegaly.  No acute abnormality noted. Electronically Signed   By: Inez Catalina M.D.   On: 05/28/2018 00:26   Dg Knee Complete 4 Views Right  Result Date: 06/19/2018 CLINICAL DATA:  Right knee pain after fall while going to dialysis. EXAM: RIGHT KNEE - COMPLETE 4+ VIEW COMPARISON:  None. FINDINGS: No evidence of fracture, dislocation, or joint  effusion. No evidence of arthropathy or other focal bone abnormality. Soft tissues are unremarkable. IMPRESSION: Negative. Electronically Signed   By: Ashley Royalty M.D.   On: 06/19/2018 18:30     EKG Interpretation  Date/Time:  Wednesday June 19 2018 11:55:22 EST Ventricular Rate:  82 PR Interval:  144 QRS Duration: 80 QT Interval:  362 QTC Calculation: 422 R Axis:   45 Text Interpretation:  Normal sinus rhythm no sig change from previous Confirmed by Charlesetta Shanks [phone]) on 06/19/2018 12:04:28 PM       MDM    Clinical Course as of Jun 18 1836  Wed Jun 19, 2018  1355 Dialysis RN notified us that patient's dialysis fistula is bleeding. We were able to apply direct pressure and control the bleeding.    [SJ]  6503 Patient's bleeding continues to be controlled. Patient continues to be somnolent and will answer some questions, but seems to ignore others. I also spoke with the patient's mother at the bedside.  She states patient does have a history of noncompliance with her insulin and her oral medications.  She states this is sometimes due to the patient's gastroparesis, which will cause vomiting and prevent her from eating.   [SJ]  27 Spoke with Dr. Daryll Drown, hospitalist. Agrees to admit the patient. She will contact nephrology.   [SJ]  1811 Spoke with Dr. Ellyn Hack, Cardiology. Agrees that the patient's chest pain and troponin elevation is likely due to her hypertension.  Recommends testing a second troponin.  Agrees with up to 20 mg labetalol and 20 mg  of hydralazine, then nitroglycerin infusion, then if this is unsuccessful may try Cardene.   [SJ]    Clinical Course User Index [SJ] Lorayne Bender, PA-C   Received patient care handoff report from [facility], [place]. Plan: Patient is at the beginning of her work-up.  She is receiving work-up for chest pain and shortness of breath. Patient was reportedly upset upon her arrival, requiring Ativan administration.  She was then complaining of pain, which warranted Dilaudid administration.  After this, patient was somnolent.  She was somnolent during my time with her.  Patient was noted to have hyperglycemia with a glucose of 810 and elevated anion gap of 18, giving concern for DKA.  I have less suspicion for HHS since the patient was originally alert and oriented. She reportedly sustained a fall that was said to be mechanical, but resulted in head injury prior to arrival.  She was alert and oriented with previous providers and head and C-spine CTs were unremarkable, giving less suspicion for serious intracranial injury. Patient was noted to be quite hypertensive from the time of her arrival here in the ED.  Chart review shows patient has had frequent previous issues with hypertension even in this range not to be due to medication noncompliance. Her hypotension today could likely be caused by a need for dialysis.  She does have a small increase in her troponin of 0.06, but without acute EKG changes.  Suspect this troponin abnormality is due to her hypertension.  This troponin will be repeated to assure no significant change.  Patient admitted to the hospital for further management.  Second troponin and VBG pending at time of admission.    Vitals:   06/19/18 1202 06/19/18 1203 06/19/18 1214 06/19/18 1218  BP: (!) 215/101   (!) 222/105  Pulse: 88     Resp: 18     Temp:   98.4 F (36.9 C)   SpO2: 98%  Weight:  84 kg    Height:  5\' 8"  (1.727 m)     Vitals:   06/19/18 1218 06/19/18 1615  06/19/18 1630 06/19/18 1645  BP: (!) 222/105 (!) 213/116 (!) 216/113 (!) 215/108  Pulse:  78 79 74  Resp:  19 20 (!) 25  Temp:      SpO2:  100% 100% 93%  Weight:      Height:           Lorayne Bender, PA-C 06/19/18 1850    Veryl Speak, MD 06/20/18 2301

## 2018-06-19 NOTE — Progress Notes (Signed)
This nurse assess patient's vasculature for placement of PIV. L arm restricted, renal patient. R  arm assessed with [place]. Veins deep and small. Not a PICC or midline candidate d/t renal involvement. Discussed with Roderic Palau RN that he will need to contact MD regarding line placement. VU. Fran Lowes, RN VAST

## 2018-06-19 NOTE — ED Notes (Signed)
Pt transported to xray 

## 2018-06-19 NOTE — Progress Notes (Signed)
Contacted provider on call Baltazar Najjar. Patient's blood pressure still high, but diltiazem titration orders only allow titration for heart rates above 106. Patient has heart rate in the 80s. New orders received to stop and discontinue diltiazem drip, and give one time dose of IV labetalol. Will continue to monitor.

## 2018-06-19 NOTE — ED Triage Notes (Signed)
Pt was on the way to dialysis and tripped and fell outside. Denies hitting head. Fell onto left knee. Went into dialysis and finished 20 minutes of treatment before c/o 10/10 headache, SOB and 10/10 knee pain. EMS was called. Pt received 0.1,g clonidine at dialysis and was 3.3kg from dry weight

## 2018-06-19 NOTE — Progress Notes (Signed)
Insulin drip and diltiazem drip are variably compatible according to [facility]. IV team unable to get other IV access. Provider on call paged. Awaiting return page.

## 2018-06-19 NOTE — ED Notes (Signed)
Attempted IV twice, unsuccessful.  IV team consult placed.

## 2018-06-19 NOTE — ED Notes (Signed)
Gave patient an ice pack.

## 2018-06-19 NOTE — ED Provider Notes (Addendum)
[facility] EMERGENCY DEPARTMENT Provider Note   CSN: [id number] Arrival date & time: 06/19/18  1152    History   Chief Complaint Chief Complaint  Patient presents with  . Migraine  . Shortness of Breath    HPI Carrie [name] is a 38 y.o. female with PMHx EDRS on dialysis M, W, F, HTN, and diabetes.      Pt presents to the ED via ambulance from dialysis complaining of right sided headache and right knee pain s/p ground level fall that occurred at dialysis. Pt states that she began feeling SOB during dialysis and had to stop after 20 minutes; she then got up and tripped, hitting her head and knee on the ground. Since then she has been having a diffuse headache. She also endorses nausea, double vision, and neck pain. BP in triage is 222/105; states she is compliant with medication. Pt was given 0.1 mg Clonidine at dialysis. Denies abdominal pain, vomiting, diarrhea, constipation, weakness, numbness, or any other associated symptoms.   The history is provided by the patient.    Past Medical History:  Diagnosis Date  . Anemia   . Diabetic neuropathy ([place])   . Diabetic retinopathy ([place])   . ESRD (end stage renal disease) on dialysis [facility])    "MWF; [place]" (02/02/2017)  . Gastroparesis   . HA (headache)    "qd if I didn't take my RX" (02/02/2017)  . Hemodialysis access site with arteriovenous graft ([place])   . Hypertension   . Neuropathy   . Renal insufficiency   . Trigeminal neuralgia   . Type I diabetes mellitus [facility])     Patient Active Problem List   Diagnosis Date Noted  . 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 Acute cystitis without hematuria 10/09/2017  . Hypertensive urgency 10/09/2017  . Nausea & vomiting 10/08/2017  . Chest pain 05/04/2017  . ESRD (end stage renal disease) ([place]) 02/01/2017  . 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 Pregnancy test positive 12/22/2016  . Amenorrhea 12/12/2016  . Diabetes ([place]) 09/16/2016  . ESRD on  hemodialysis ([place])   . Gastroparesis   . Intractable vomiting 09/04/2016  . 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 Intractable vomiting with nausea 04/04/2015  . AKI (acute kidney injury) ([place]) 04/04/2015  . DKA (diabetic ketoacidoses) ([place]) 04/03/2015  . Trigeminal neuralgia 12/12/2013  . HA (headache)   . DKA, type 2 ([place]) 10/24/2012  . Contact dermatitis 10/24/2012  . SORE THROAT 03/22/2010  . 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 FATIGUE 05/25/2009  . VAGINAL DISCHARGE 01/28/2009  . Carbuncle and furuncle of unspecified site 12/29/2008  . HYPERCHOLESTEROLEMIA 06/12/2008  . MICROALBUMINURIA 06/12/2008  . MONILIAL VAGINITIS 11/14/2007  . INGUINAL PAIN, BILATERAL 11/14/2007  . DYSURIA 10/17/2007  . CYSTITIS 09/05/2007  . DELAYED MENSES 09/05/2007  . RHINOCONJUNCTIVITIS, ALLERGIC 07/19/2007  . DENTAL PAIN 03/28/2007  . HYPERLIPIDEMIA 09/01/2006  . PELVIC INFLAMMATORY DISEASE 09/01/2006  . ABORTION, SPONTANEOUS 09/01/2006  . ABSCESS 09/01/2006  . HX, PERSONAL, PAST NONCOMPLIANCE 09/01/2006    Past Surgical History:  Procedure Laterality Date  . AV FISTULA PLACEMENT Left 09/07/2016   Procedure: Left arm 1st Stage Basilic AV Fistula;  Surgeon: [name], MD;  Location: [facility] OR;  Service: Vascular;  Laterality: Left;  . DILATION AND CURETTAGE OF UTERUS    . EXCISIONAL HEMORRHOIDECTOMY    . EYE SURGERY    . INSERTION OF DIALYSIS CATHETER Right 09/07/2016   Procedure: INSERTION OF  DIALYSIS CATHETER;  Surgeon: [name], MD;  Location: [facility] OR;  Service: Vascular;  Laterality: Right;  . IR FLUORO GUIDE CV LINE LEFT  06/20/2018  . IR US GUIDE VASC ACCESS LEFT  06/20/2018  . RETINAL LASER PROCEDURE Bilateral    "related to diabetes"  . REVISON OF ARTERIOVENOUS FISTULA Left 01/15/2017   Procedure: INSERTION OF  AV GORE-TEX GRAFT;  Surgeon: Elam Dutch, MD;  Location: [place];  Service: Vascular;  Laterality: Left;  . TEE WITHOUT CARDIOVERSION N/A 01/11/2017     Procedure: TRANSESOPHAGEAL ECHOCARDIOGRAM (TEE);  Surgeon: Jerline Pain, MD;  Location: [facility] ENDOSCOPY;  Service: Cardiovascular;  Laterality: N/A;     OB History    Gravida  7   Para  1   Term      Preterm  1   AB  5   Living  1     SAB  2   TAB  3   Ectopic      Multiple      Live Births               Home Medications    Prior to Admission medications   Medication Sig Start Date End Date Taking? Authorizing Provider  amLODipine (NORVASC) 10 MG tablet Take 10 mg by mouth at bedtime. 10/01/17   [provider]  benzonatate (TESSALON) 200 MG capsule Take 200 mg by mouth 3 (three) times daily. 05/25/18   [provider]  carbamazepine (TEGRETOL) 200 MG tablet Take 1 tablet (200 mg total) by mouth at bedtime. 05/07/17   Annita Brod, MD  diphenhydramine-acetaminophen (TYLENOL PM) 25-500 MG TABS tablet Take 1 tablet by mouth at bedtime as needed (pain).    [provider]  ferric citrate (AURYXIA) 1 GM 210 MG(Fe) tablet Take 420 mg by mouth 3 (three) times daily with meals.     [provider]  gabapentin (NEURONTIN) 300 MG capsule Take 3 capsules (900 mg total) by mouth at bedtime. Patient taking differently: Take 300 mg by mouth at bedtime.  10/10/17   Hongalgi, Lenis Dickinson, MD  insulin aspart protamine - aspart (NOVOLOG MIX 70/30 FLEXPEN) (70-30) 100 UNIT/ML FlexPen Inject 0.15-0.25 mLs (15-25 Units total) into the skin See admin instructions. On non-dialysis days inj 25 units in the morning and 15 units at night. On dialysis days inj 15 units in the morning and 15 units at night. Patient taking differently: Inject 15 Units into the skin 3 (three) times daily with meals.  10/10/17   Hongalgi, Lenis Dickinson, MD  labetalol (NORMODYNE) 300 MG tablet Take 300 mg by mouth See admin instructions. Take one tablet (300 mg) by mouth on Monday, Wednesday, Friday - dialysis days - at bedtime, take one tablet (300 mg) by mouth twice daily on Sunday,  Tuesday, Thursday, Saturday - non-dialysis days 08/25/16   [provider]  lisinopril (PRINIVIL,ZESTRIL) 20 MG tablet Take 20 mg by mouth at bedtime.     [provider]  Menthol-Methyl Salicylate (MUSCLE RUB) 10-15 % CREA Apply 1 application topically daily as needed for muscle pain.    [provider]  metoCLOPramide (REGLAN) 10 MG tablet Take 1 tablet (10 mg total) by mouth 3 (three) times daily before meals. 05/28/18   Ward, Delice Bison, DO  ondansetron (ZOFRAN) 4 MG tablet Take 1 tablet (4 mg total) by mouth every 8 (eight) hours as needed for nausea or vomiting. Patient not taking: Reported on 05/18/2018 09/12/16   Lawson Radar  Reesa Chew, MD  ondansetron (ZOFRAN-ODT) 4 MG disintegrating tablet Take 4 mg by mouth 2 (two) times daily as needed for nausea or vomiting.  01/05/18   [provider]  Phenylephrine-DM-GG-APAP (TYLENOL COLD MULTI-SYMPTOM DAY) 5-10-200-325 MG/15ML LIQD Take 15 mLs by mouth every 4 (four) hours as needed (cough/cold symptoms).    [provider]  pregabalin (LYRICA) 75 MG capsule Take 1 capsule (75 mg total) by mouth 2 (two) times daily. Patient taking differently: Take 75 mg by mouth at bedtime.  04/09/18   Pieter Partridge, DO  promethazine (PHENERGAN) 25 MG tablet Take 1 tablet (25 mg total) by mouth every 8 (eight) hours as needed for nausea or vomiting. 05/28/18   Ward, Delice Bison, DO  spironolactone (ALDACTONE) 25 MG tablet Take 25 mg by mouth daily. as directed 06/13/18   [provider]  traMADol (ULTRAM-ER) 100 MG 24 hr tablet Take 100 mg by mouth daily as needed for moderate pain. 05/21/18   [provider]    Family History Family History  Problem Relation Age of Onset  . Hyperlipidemia Mother   . Asthma Daughter   . Diabetes Maternal Grandmother   . Stroke Maternal Grandmother   . Kidney disease Maternal Grandfather   . Kidney disease Paternal Grandmother     Social History Social History   Tobacco  Use  . Smoking status: Never Smoker  . Smokeless tobacco: Never Used  Substance Use Topics  . Alcohol use: Yes    Alcohol/week: 4.0 standard drinks    Types: 4 Standard drinks or equivalent per week    Comment: 02/02/2017 "nothing in the last 6 months; never had problem w/it"  . Drug use: No     Allergies   Patient has no known allergies.   Review of Systems Review of Systems  Constitutional: Negative for chills and fever.  HENT: Negative for ear pain and sore throat.   Eyes: Positive for visual disturbance.  Respiratory: Positive for shortness of breath.   Gastrointestinal: Positive for nausea. Negative for abdominal pain, constipation, diarrhea and vomiting.  Genitourinary: Negative for frequency and urgency.  Musculoskeletal: Positive for arthralgias (right knee) and neck pain.  Neurological: Positive for headaches.     Physical Exam Updated Vital Signs BP (!) 160/92 Comment: meds restarted  Pulse 75   Temp (!) 97.4 F (36.3 C) (Oral)   Resp (!) 22   Ht 5\' 8"  (1.727 m)   Wt 84 kg   LMP 07/16/2016   SpO2 100%   BMI 28.16 kg/m   Physical Exam Vitals signs and nursing note reviewed.  Constitutional:      Appearance: She is well-developed.  HENT:     Head: Normocephalic and atraumatic.     Comments: TTP to right temporal region; no hematoma appreciated    Mouth/Throat:     Mouth: Mucous membranes are moist.  Eyes:     Extraocular Movements: Extraocular movements intact.     Conjunctiva/sclera: Conjunctivae normal.     Pupils: Pupils are equal, round, and reactive to light.  Neck:     Musculoskeletal: Normal range of motion and neck supple.  Cardiovascular:     Rate and Rhythm: Normal rate and regular rhythm.     Heart sounds: No murmur.  Pulmonary:     Effort: Pulmonary effort is normal. No respiratory distress.     Breath sounds: Normal breath sounds.  Chest:     Chest wall: No tenderness.  Abdominal:     Palpations: Abdomen is  soft.     Tenderness:  There is no abdominal tenderness.  Musculoskeletal:     Right lower leg: She exhibits tenderness.     Comments: Mild tenderness to medial aspect of right knee; no significant swelling noted.   Skin:    General: Skin is warm and dry.  Neurological:     Mental Status: She is alert.     Comments: No weakness to BUEs and BLEs; strength 5/5 throughout; sensation intact. No pronator drift. Pt unable to complete finger to nose due to       ED Treatments / Results  Labs (all labs ordered are listed, but only abnormal results are displayed) Labs Reviewed  COMPREHENSIVE METABOLIC PANEL - Abnormal; Notable for the following components:      Result Value   Sodium 127 (*)    Chloride 87 (*)    Glucose, Bld 810 (*)    BUN 43 (*)    Creatinine, Ser 9.56 (*)    AST 43 (*)    ALT 55 (*)    GFR calc non Af Amer 5 (*)    GFR calc Af Amer 6 (*)    Anion gap 18 (*)    All other components within normal limits  CBC WITH DIFFERENTIAL/PLATELET - Abnormal; Notable for the following components:   RBC 3.67 (*)    Hemoglobin 11.3 (*)    HCT 35.0 (*)    RDW 17.5 (*)    Platelets 135 (*)    Lymphs Abs 0.5 (*)    All other components within normal limits  TROPONIN I - Abnormal; Notable for the following components:   Troponin I 0.06 (*)    All other components within normal limits  BASIC METABOLIC PANEL - Abnormal; Notable for the following components:   Sodium 132 (*)    Chloride 91 (*)    CO2 18 (*)    Glucose, Bld 376 (*)    BUN 49 (*)    Creatinine, Ser 9.95 (*)    GFR calc non Af Amer 5 (*)    GFR calc Af Amer 5 (*)    Anion gap 23 (*)    All other components within normal limits  BASIC METABOLIC PANEL - Abnormal; Notable for the following components:   Sodium 132 (*)    Chloride 94 (*)    Glucose, Bld 111 (*)    BUN 49 (*)    Creatinine, Ser 9.71 (*)    GFR calc non Af Amer 5 (*)    GFR calc Af Amer 5 (*)    All other components within normal limits  CBC - Abnormal; Notable for  the following components:   RBC 3.66 (*)    Hemoglobin 11.1 (*)    HCT 33.8 (*)    RDW 17.8 (*)    Platelets 136 (*)    All other components within normal limits  TROPONIN I - Abnormal; Notable for the following components:   Troponin I 0.07 (*)    All other components within normal limits  TROPONIN I - Abnormal; Notable for the following components:   Troponin I 0.11 (*)    All other components within normal limits  TROPONIN I - Abnormal; Notable for the following components:   Troponin I 0.10 (*)    All other components within normal limits  GLUCOSE, CAPILLARY - Abnormal; Notable for the following components:   Glucose-Capillary 326 (*)    All other components within normal limits  GLUCOSE, CAPILLARY - Abnormal; Notable  for the following components:   Glucose-Capillary 223 (*)    All other components within normal limits  GLUCOSE, CAPILLARY - Abnormal; Notable for the following components:   Glucose-Capillary 158 (*)    All other components within normal limits  GLUCOSE, CAPILLARY - Abnormal; Notable for the following components:   Glucose-Capillary 109 (*)    All other components within normal limits  GLUCOSE, CAPILLARY - Abnormal; Notable for the following components:   Glucose-Capillary 101 (*)    All other components within normal limits  GLUCOSE, CAPILLARY - Abnormal; Notable for the following components:   Glucose-Capillary 120 (*)    All other components within normal limits  GLUCOSE, CAPILLARY - Abnormal; Notable for the following components:   Glucose-Capillary 100 (*)    All other components within normal limits  GLUCOSE, CAPILLARY - Abnormal; Notable for the following components:   Glucose-Capillary 131 (*)    All other components within normal limits  GLUCOSE, CAPILLARY - Abnormal; Notable for the following components:   Glucose-Capillary 162 (*)    All other components within normal limits  GLUCOSE, CAPILLARY - Abnormal; Notable for the following components:     Glucose-Capillary 164 (*)    All other components within normal limits  GLUCOSE, CAPILLARY - Abnormal; Notable for the following components:   Glucose-Capillary 182 (*)    All other components within normal limits  BASIC METABOLIC PANEL - Abnormal; Notable for the following components:   CO2 20 (*)    Glucose, Bld 205 (*)    BUN 36 (*)    Creatinine, Ser 7.26 (*)    GFR calc non Af Amer 7 (*)    GFR calc Af Amer 8 (*)    All other components within normal limits  CBC - Abnormal; Notable for the following components:   RBC 3.10 (*)    Hemoglobin 9.6 (*)    HCT 29.6 (*)    RDW 18.7 (*)    Platelets 136 (*)    All other components within normal limits  GLUCOSE, CAPILLARY - Abnormal; Notable for the following components:   Glucose-Capillary 195 (*)    All other components within normal limits  GLUCOSE, CAPILLARY - Abnormal; Notable for the following components:   Glucose-Capillary 293 (*)    All other components within normal limits  GLUCOSE, CAPILLARY - Abnormal; Notable for the following components:   Glucose-Capillary 304 (*)    All other components within normal limits  HEMOGLOBIN A1C - Abnormal; Notable for the following components:   Hgb A1c MFr Bld 9.7 (*)    All other components within normal limits  GLUCOSE, CAPILLARY - Abnormal; Notable for the following components:   Glucose-Capillary 184 (*)    All other components within normal limits  GLUCOSE, CAPILLARY - Abnormal; Notable for the following components:   Glucose-Capillary 68 (*)    All other components within normal limits  GLUCOSE, CAPILLARY - Abnormal; Notable for the following components:   Glucose-Capillary 114 (*)    All other components within normal limits  GLUCOSE, CAPILLARY - Abnormal; Notable for the following components:   Glucose-Capillary 115 (*)    All other components within normal limits  GLUCOSE, CAPILLARY - Abnormal; Notable for the following components:   Glucose-Capillary 219 (*)    All  other components within normal limits  CBG MONITORING, ED - Abnormal; Notable for the following components:   Glucose-Capillary >600 (*)    All other components within normal limits  CBG MONITORING, ED - Abnormal; Notable for  the following components:   Glucose-Capillary >600 (*)    All other components within normal limits  CBG MONITORING, ED - Abnormal; Notable for the following components:   Glucose-Capillary >600 (*)    All other components within normal limits  CBG MONITORING, ED - Abnormal; Notable for the following components:   Glucose-Capillary 482 (*)    All other components within normal limits  MRSA PCR SCREENING  LIPASE, BLOOD  RAPID HIV SCREEN (HIV 1/2 AB+AG)  HEPATITIS PANEL, ACUTE  BETA-HYDROXYBUTYRIC ACID  BLOOD GAS, ARTERIAL  GLUCOSE, CAPILLARY  PHOSPHORUS  HCG, QUANTITATIVE, PREGNANCY  URINALYSIS, ROUTINE W REFLEX MICROSCOPIC  RAPID URINE DRUG SCREEN, HOSP PERFORMED  BASIC METABOLIC PANEL  CBC  I-STAT BETA HCG BLOOD, ED ([facility], [facility], [facility] ONLY)    EKG EKG Interpretation  Date/Time:  Wednesday June 19 2018 13:20:30 EST Ventricular Rate:  79 PR Interval:  144 QRS Duration: 81 QT Interval:  372 QTC Calculation: 427 R Axis:   49 Text Interpretation:  Sinus rhythm Left atrial enlargement No significant change since last tracing Confirmed by Isla Pence [phone]) on 06/20/2018 2:02:04 PM   Radiology Ct Head Wo Contrast  Result Date: 06/21/2018 CLINICAL DATA:  38 y/o F; altered mental status while at hemodialysis. EXAM: CT HEAD WITHOUT CONTRAST TECHNIQUE: Contiguous axial images were obtained from the base of the skull through the vertex without intravenous contrast. COMPARISON:  06/19/2018 CT head. FINDINGS: Brain: No evidence of acute infarction, hemorrhage, hydrocephalus, extra-axial collection or mass lesion/mass effect. Stable age advanced volume loss of the brain. Vascular: No hyperdense vessel or unexpected calcification. Skull: Normal. Negative for fracture  or focal lesion. Sinuses/Orbits: Mucous retention cysts are present within the right maxillary sinus and the sphenoid sinus. Additional visible paranasal sinuses and the mastoid air cells are normally aerated. Orbits are unremarkable. Right intra-ocular lens replacement. Other: None. IMPRESSION: 1. No acute intracranial abnormality identified. 2. Stable age advanced volume loss of the brain. Electronically Signed   By: Kristine Garbe M.D.   On: 06/21/2018 14:26   Mr Brain Wo Contrast  Result Date: 06/21/2018 CLINICAL DATA:  38 y/o  F; encephalopathy. EXAM: MRI HEAD WITHOUT CONTRAST TECHNIQUE: Multiplanar, multiecho pulse sequences of the brain and surrounding structures were obtained without intravenous contrast. COMPARISON:  06/21/2018 CT head FINDINGS: Brain: No acute infarction, hemorrhage, hydrocephalus, extra-axial collection or mass lesion. Age advanced diffuse volume loss of the brain. No additional structural or signal abnormality is evident. Vascular: Normal flow voids. Skull and upper cervical spine: Normal marrow signal. Sinuses/Orbits: Small mucous retention cysts within the sphenoid sinus and the right maxillary sinus. No abnormal signal of mastoid air cells. Right intra-ocular lens replacement. Other: None. IMPRESSION: 1. No acute intracranial abnormality identified. 2. Stable diffuse age advanced volume loss of the brain. Electronically Signed   By: Kristine Garbe M.D.   On: 06/21/2018 18:17   Ir Fluoro Guide Cv Line Left  Result Date: 06/20/2018 INDICATION: End-stage renal disease.  For IV access. EXAM: TUNNELED LEFT INTERNAL JUGULAR PICC LINE PLACEMENT WITH ULTRASOUND AND FLUOROSCOPIC GUIDANCE MEDICATIONS: None ANESTHESIA/SEDATION: None FLUOROSCOPY TIME:  Fluoroscopy Time:  minutes 6 seconds (0.4 mGy). COMPLICATIONS: None immediate. PROCEDURE: The patient's mother was advised of the possible risks and complications and agreed to undergo the procedure. The patient was then  brought to the angiographic suite for the procedure. The left neck was prepped with chlorhexidine, draped in the usual sterile fashion using maximum barrier technique (cap and mask, sterile gown, sterile gloves, large sterile sheet, hand hygiene  and cutaneous antiseptic). Local anesthesia was attained by infiltration with 1% lidocaine. Ultrasound demonstrated patency of the left jugular vein, and this was documented with an image. Under real-time ultrasound guidance, this vein was accessed with a 21 gauge micropuncture needle and image documentation was performed. The needle was exchanged over a guidewire for a peel-away sheath through which a 27 cm 5 [place] double lumen tunneled power injectable PICC was advanced, and positioned with its tip at the lower SVC/right atrial junction. The cuff was positioned in the subcutaneous tract. Fluoroscopy during the procedure and fluoro spot radiograph confirms appropriate catheter position. The catheter was flushed, secured to the skin with Prolene sutures, and covered with a sterile dressing. IMPRESSION: Successful placement of a tunneled left internal jugular PICC with sonographic and fluoroscopic guidance. The catheter is ready for use. Electronically Signed   By: Marybelle Killings M.D.   On: 06/20/2018 15:23   Ir US Guide Vasc Access Left  Result Date: 06/20/2018 INDICATION: End-stage renal disease.  For IV access. EXAM: TUNNELED LEFT INTERNAL JUGULAR PICC LINE PLACEMENT WITH ULTRASOUND AND FLUOROSCOPIC GUIDANCE MEDICATIONS: None ANESTHESIA/SEDATION: None FLUOROSCOPY TIME:  Fluoroscopy Time:  minutes 6 seconds (0.4 mGy). COMPLICATIONS: None immediate. PROCEDURE: The patient's mother was advised of the possible risks and complications and agreed to undergo the procedure. The patient was then brought to the angiographic suite for the procedure. The left neck was prepped with chlorhexidine, draped in the usual sterile fashion using maximum barrier technique (cap and mask,  sterile gown, sterile gloves, large sterile sheet, hand hygiene and cutaneous antiseptic). Local anesthesia was attained by infiltration with 1% lidocaine. Ultrasound demonstrated patency of the left jugular vein, and this was documented with an image. Under real-time ultrasound guidance, this vein was accessed with a 21 gauge micropuncture needle and image documentation was performed. The needle was exchanged over a guidewire for a peel-away sheath through which a 27 cm 5 [place] double lumen tunneled power injectable PICC was advanced, and positioned with its tip at the lower SVC/right atrial junction. The cuff was positioned in the subcutaneous tract. Fluoroscopy during the procedure and fluoro spot radiograph confirms appropriate catheter position. The catheter was flushed, secured to the skin with Prolene sutures, and covered with a sterile dressing. IMPRESSION: Successful placement of a tunneled left internal jugular PICC with sonographic and fluoroscopic guidance. The catheter is ready for use. Electronically Signed   By: Marybelle Killings M.D.   On: 06/20/2018 15:23    Procedures Procedures (including critical care time)  Medications Ordered in ED Medications  ferric citrate (AURYXIA) tablet 420 mg (420 mg Oral Not Given 06/21/18 1621)  heparin injection 5,000 Units (5,000 Units Subcutaneous Given 06/21/18 2207)  ondansetron (ZOFRAN) injection 4 mg (4 mg Intravenous Given 06/20/18 1749)  Chlorhexidine Gluconate Cloth 2 % PADS 6 each (6 each Topical Given 06/21/18 2211)  pentafluoroprop-tetrafluoroeth (GEBAUERS) aerosol 1 application (has no administration in time range)  lidocaine-prilocaine (EMLA) cream 1 application (has no administration in time range)  dextrose (GLUTOSE) 40 % oral gel 75 g (75 g Oral Not Given 06/20/18 0437)  lidocaine (PF) (XYLOCAINE) 1 % injection 5 mL (has no administration in time range)  0.9 %  sodium chloride infusion (has no administration in time range)  heparin injection 1,000  Units (has no administration in time range)  alteplase (CATHFLO ACTIVASE) injection 2 mg (has no administration in time range)  heparin injection 3,000 Units (3,000 Units Dialysis Given 06/21/18 0756)  cloNIDine (CATAPRES - Dosed in mg/24 hr)  patch 0.2 mg (0.2 mg Transdermal Patch Applied 06/20/18 1330)  hydrALAZINE (APRESOLINE) injection 10 mg (10 mg Intravenous Given 06/20/18 1528)  lactated ringers infusion ( Intravenous Stopped 06/20/18 1854)  lidocaine (XYLOCAINE) 1 % (with pres) injection (has no administration in time range)  haloperidol lactate (HALDOL) injection 2 mg (2 mg Intravenous Given 06/21/18 1703)  sodium chloride flush (NS) 0.9 % injection 10-40 mL (10 mLs Intracatheter Given 06/21/18 2208)  nicardipine (CARDENE) 20mg  in 0.86% saline 216ml IV infusion (0.1 mg/ml) (0 mg/hr Intravenous Stopped 06/21/18 1227)  LORazepam (ATIVAN) 2 MG/ML injection (has no administration in time range)  LORazepam (ATIVAN) 2 MG/ML injection (has no administration in time range)  dextrose (GLUTOSE) 40 % oral gel 75 g (0 g Oral Hold 06/21/18 0745)  insulin glargine (LANTUS) injection 8 Units (8 Units Subcutaneous Given 06/21/18 0851)  insulin aspart (novoLOG) injection 0-15 Units (5 Units Subcutaneous Given 06/21/18 1950)  lactated ringers infusion ( Intravenous Rate/Dose Verify 06/21/18 2100)  dexmedetomidine (PRECEDEX) 200 MCG/50ML (4 mcg/mL) infusion (0 mcg/kg/hr  84.7 kg Intravenous Stopped 06/21/18 2035)  ondansetron (ZOFRAN) injection 4 mg (4 mg Intravenous Given 06/19/18 1453)  LORazepam (ATIVAN) tablet 1 mg (1 mg Oral Given 06/19/18 1315)  ketorolac (TORADOL) 30 MG/ML injection 30 mg (30 mg Intravenous Given 06/19/18 1453)  HYDROmorphone (DILAUDID) injection 1 mg (1 mg Intravenous Given 06/19/18 1506)  labetalol (NORMODYNE,TRANDATE) injection 20 mg (20 mg Intravenous Given 06/19/18 1629)  potassium chloride 10 mEq in 100 mL IVPB (10 mEq Intravenous New Bag/Given 06/19/18 1913)  sodium chloride 0.9 % bolus 250 mL (0 mLs  Intravenous Stopped 06/19/18 1853)  hydrALAZINE (APRESOLINE) injection 10 mg (10 mg Intravenous Given 06/19/18 1806)  heparin injection 3,000 Units (3,000 Units Dialysis Given 06/20/18 1055)  labetalol (NORMODYNE,TRANDATE) injection 20 mg (20 mg Intravenous Given 06/20/18 0020)  LORazepam (ATIVAN) injection 0.5 mg (0.5 mg Intravenous Given 06/20/18 0412)  LORazepam (ATIVAN) injection 1 mg (1 mg Intravenous Given 06/20/18 0727)  LORazepam (ATIVAN) injection 1 mg (1 mg Intravenous Given 06/20/18 1054)  lidocaine (PF) (XYLOCAINE) 1 % injection (10 mLs Infiltration Given 06/20/18 1452)  morphine 2 MG/ML injection 1 mg (1 mg Intravenous Given 06/20/18 2022)  morphine 2 MG/ML injection 2 mg (2 mg Intravenous Given 06/21/18 0002)  haloperidol lactate (HALDOL) injection 2.5 mg (2.5 mg Intravenous Given 06/21/18 0049)  LORazepam (ATIVAN) injection 1 mg (1 mg Intravenous Given 06/21/18 0221)  insulin aspart (novoLOG) injection 4 Units (4 Units Subcutaneous Given 06/21/18 0357)  LORazepam (ATIVAN) injection 1 mg (1 mg Intravenous Given 06/21/18 0519)  haloperidol lactate (HALDOL) 5 MG/ML injection (5 mg  Given 06/21/18 0745)  haloperidol lactate (HALDOL) injection 5 mg (0 mg Intravenous Duplicate 07/17/30 4401)  LORazepam (ATIVAN) injection 2 mg (2 mg Intravenous Given 06/21/18 0911)  dextrose 50 % solution 12.5 g (12.5 g Intravenous Given 06/21/18 1236)     Initial Impression / Assessment and Plan / ED Course  I have reviewed the triage vital signs and the nursing notes.  Pertinent labs & imaging results that were available during my care of the patient were reviewed by me and considered in my medical decision making (see chart for details).  Clinical Course as of Jun 20 2212  Wed Jun 19, 2018  1355 Dialysis RN notified us that patient's dialysis fistula is bleeding. We were able to apply direct pressure and control the bleeding.    [SJ]  0272 Patient's bleeding continues to be controlled. Patient continues to be somnolent and will  answer  some questions, but seems to ignore others. I also spoke with the patient's mother at the bedside.  She states patient does have a history of noncompliance with her insulin and her oral medications.  She states this is sometimes due to the patient's gastroparesis, which will cause vomiting and prevent her from eating.   [SJ]  40 Spoke with Dr. Daryll Drown, hospitalist. Agrees to admit the patient. She will contact nephrology.   [SJ]  1811 Spoke with Dr. Ellyn Hack, Cardiology. Agrees that the patient's chest pain and troponin elevation is likely due to her hypertension.  Recommends testing a second troponin.  Agrees with up to 20 mg labetalol and 20 mg of hydralazine, then nitroglycerin infusion, then if this is unsuccessful may try Cardene.   [SJ]    Clinical Course User Index [SJ] Joy, Shawn C, PA-C   Pt presents with multiple complaints; mainly she is complaining of a diffuse headache s/p ground level fall that occurred PTA at dialysis. Dialysis was stopped early as pt states she was feeling SOB although triage note states that pt was having headache, SOB, and knee pain during dialysis session. Pt states her headache is from hitting her head after the fall; she also states she hit her right knee during the fall. From a trauma standpoint will get CT Head, CT neck, and DG R knee to evaluate for any acute bleeds or fractures.   Upon presentation pt is hypertensive at 222/105; unsure if she is experiencing headache from this or if it is truly from head injury. Already received 0.1 g clonidine at dialysis; will treat pt's pain and see if this improves. Per chart review pt's blood pressure is never under controlled despite her stating she is compliant with her medications. Baseline labs ordered including CBC, CMP, Troponin, Lipase, urine preg, and U/A. CXR ordered as well due to SOB; pt could be having pulmonary edema secondary to ESRD vs ACS vs PE; although low suspicion for all of these as pt is not  hypoxic, not using accessory muscles, and LCTAB. 4 mg IV Morphine ordered as well as zofran for nausea. Will reevaluate accordingly.   1:08 PM Nursing staff informed me patient is having difficulty seeing out of her right eye; pt did hit head on right side; CT Head will show any acute issues. Pt is poor historian; unable to fully assess at this time due to pain level. Will reevaluate once pain under control; no focal neuro deficits appreciated at this time.   1:15 PM Nursing staff again informed me that they are having difficulty placing IV; have put out consult for IV team. Pt unable to sit still at this time; 1 mg Ativan ordered.   1:32 PM Pt still complaining of significant pain to head; unable to get morphine at this time due to IV access issues. Will order 30 mg IM Toradol. Pt still complaining of difficulty seeing; will not open eyes fully for me to assess. States she is supposed to wear glasses.   2:05 PM Pt with CBG > 600. No other labs back due to IV access issues; went to update patient when IV team was in room trying to get access. Spent 20 minutes in the room without success. Informed attending physician Dr. Venora Maples who placed EJ IV access; chem 7 needed prior to insulin. Will also get in and out cath for U/A to check for ketones.   While in the room; pt's father states that she has been vomiting for the last week which  patient did not inform me of during questioning. He states she has been unable to keep any medicines down due to this. Pt is adamant that she took her insulin this morning and yesterday as well although father cannot confirm nor deny this.   3:40 PM Nursing staff pulled attending physician Dr. Venora Maples and I into the room. They were attempting to remove pt's dialysis access when the site began to bleed profusely. Direct pressure to be applied for 25 minutes and will reassess. Still awaiting potassium level prior to starting insulin.   4:13 PM Arlean Hopping, PA-C to take over  case. Still awaiting all lab work. Blood pressure has not been taken since 12:18 PM given she has been unable to stay still and continues to take off blood pressure cuff. Shawn Joy ordered 20 mg Labetalol to reduce hypertension.  CT head and CT C spine without acute findings.         Final Clinical Impressions(s) / ED Diagnoses   Final diagnoses:  Diabetic ketoacidosis without coma associated with type 2 diabetes mellitus ([place])  Shortness of breath  Hypertensive urgency    ED Discharge Orders    None       Eustaquio Maize, Hershal Coria 06/19/18 [place], Kevin, MD 06/19/18 [place], [place], PA-C 06/21/18 2214    Jola Schmidt, MD 06/22/18 [id number]

## 2018-06-19 NOTE — ED Notes (Signed)
Pt seen by admitting and critical care physicians.

## 2018-06-19 NOTE — ED Notes (Signed)
Pt is frigidity and unable to sit still. Appears anxious but unable to express needs.  Denies HI/SI.

## 2018-06-19 NOTE — Progress Notes (Signed)
Provider on call [place] returned page. Told to go ahead and run insulin drip with diltiazem, even though micromedex says variable compatibility. Will start diltiazem drip and continue to monitor.

## 2018-06-20 ENCOUNTER — Inpatient Hospital Stay (HOSPITAL_COMMUNITY): Payer: BLUE CROSS/BLUE SHIELD

## 2018-06-20 ENCOUNTER — Encounter (HOSPITAL_COMMUNITY): Payer: Self-pay | Admitting: Interventional Radiology

## 2018-06-20 ENCOUNTER — Other Ambulatory Visit (HOSPITAL_COMMUNITY): Payer: BLUE CROSS/BLUE SHIELD

## 2018-06-20 DIAGNOSIS — E1010 Type 1 diabetes mellitus with ketoacidosis without coma: Principal | ICD-10-CM

## 2018-06-20 DIAGNOSIS — I6783 Posterior reversible encephalopathy syndrome: Secondary | ICD-10-CM

## 2018-06-20 HISTORY — PX: IR US GUIDE VASC ACCESS LEFT: IMG2389

## 2018-06-20 HISTORY — PX: IR FLUORO GUIDE CV LINE LEFT: IMG2282

## 2018-06-20 LAB — BASIC METABOLIC PANEL
Anion gap: 15 (ref 5–15)
BUN: 49 mg/dL — ABNORMAL HIGH (ref 6–20)
CHLORIDE: 94 mmol/L — AB (ref 98–111)
CO2: 23 mmol/L (ref 22–32)
Calcium: 9.5 mg/dL (ref 8.9–10.3)
Creatinine, Ser: 9.71 mg/dL — ABNORMAL HIGH (ref 0.44–1.00)
GFR calc Af Amer: 5 mL/min — ABNORMAL LOW (ref >60)
GFR calc non Af Amer: 5 mL/min — ABNORMAL LOW (ref >60)
Glucose, Bld: 111 mg/dL — ABNORMAL HIGH (ref 70–99)
Potassium: 3.7 mmol/L (ref 3.5–5.1)
Sodium: 132 mmol/L — ABNORMAL LOW (ref 135–145)

## 2018-06-20 LAB — GLUCOSE, CAPILLARY
GLUCOSE-CAPILLARY: 98 mg/dL (ref 70–99)
GLUCOSE-CAPILLARY: 164 mg/dL — AB (ref 70–99)
Glucose-Capillary: 100 mg/dL — ABNORMAL HIGH (ref 70–99)
Glucose-Capillary: 101 mg/dL — ABNORMAL HIGH (ref 70–99)
Glucose-Capillary: 109 mg/dL — ABNORMAL HIGH (ref 70–99)
Glucose-Capillary: 120 mg/dL — ABNORMAL HIGH (ref 70–99)
Glucose-Capillary: 131 mg/dL — ABNORMAL HIGH (ref 70–99)
Glucose-Capillary: 162 mg/dL — ABNORMAL HIGH (ref 70–99)
Glucose-Capillary: 182 mg/dL — ABNORMAL HIGH (ref 70–99)
Glucose-Capillary: 195 mg/dL — ABNORMAL HIGH (ref 70–99)
Glucose-Capillary: 293 mg/dL — ABNORMAL HIGH (ref 70–99)

## 2018-06-20 LAB — MRSA PCR SCREENING: MRSA by PCR: NEGATIVE

## 2018-06-20 LAB — PHOSPHORUS: Phosphorus: 2.5 mg/dL (ref 2.5–4.6)

## 2018-06-20 LAB — HCG, QUANTITATIVE, PREGNANCY: hCG, Beta Chain, Quant, S: 2 m[IU]/mL (ref <5)

## 2018-06-20 LAB — TROPONIN I
TROPONIN I: 0.11 ng/mL — AB (ref <0.03)
Troponin I: 0.1 ng/mL (ref <0.03)

## 2018-06-20 LAB — HEPATITIS PANEL, ACUTE
HCV Ab: <0.1 s/co ratio (ref 0.0–0.9)
Hep A IgM: NEGATIVE
Hep B C IgM: NEGATIVE
Hepatitis B Surface Ag: NEGATIVE

## 2018-06-20 MED ORDER — LORAZEPAM 2 MG/ML IJ SOLN
1.0000 mg | Freq: Once | INTRAMUSCULAR | Status: AC
  Administered 2018-06-20: 1 mg via INTRAVENOUS

## 2018-06-20 MED ORDER — INSULIN ASPART 100 UNIT/ML ~~LOC~~ SOLN
0.0000 [IU] | Freq: Three times a day (TID) | SUBCUTANEOUS | Status: DC
  Administered 2018-06-20: 3 [IU] via SUBCUTANEOUS

## 2018-06-20 MED ORDER — HEPARIN SODIUM (PORCINE) 1000 UNIT/ML DIALYSIS
3000.0000 [IU] | INTRAMUSCULAR | Status: DC | PRN
  Administered 2018-06-21: 3000 [IU] via INTRAVENOUS_CENTRAL

## 2018-06-20 MED ORDER — SODIUM CHLORIDE 0.9 % IV SOLN
INTRAVENOUS | Status: DC
  Administered 2018-06-20: 04:00:00 via INTRAVENOUS

## 2018-06-20 MED ORDER — LIDOCAINE-PRILOCAINE 2.5-2.5 % EX CREA: 1.0000 "application " | TOPICAL_CREAM | CUTANEOUS | Status: DC | PRN

## 2018-06-20 MED ORDER — INSULIN GLARGINE 100 UNIT/ML ~~LOC~~ SOLN
5.0000 [IU] | Freq: Every day | SUBCUTANEOUS | Status: DC
  Filled 2018-06-20: qty 0.05

## 2018-06-20 MED ORDER — INSULIN ASPART 100 UNIT/ML ~~LOC~~ SOLN: 0.0000 [IU] | Freq: Every day | SUBCUTANEOUS | Status: DC

## 2018-06-20 MED ORDER — MORPHINE SULFATE (PF) 2 MG/ML IV SOLN
1.0000 mg | Freq: Once | INTRAVENOUS | Status: AC
  Administered 2018-06-20: 1 mg via INTRAVENOUS
  Filled 2018-06-20: qty 1

## 2018-06-20 MED ORDER — SODIUM CHLORIDE 0.9 % IV SOLN: 100.0000 mL | INTRAVENOUS | Status: DC | PRN

## 2018-06-20 MED ORDER — HYDRALAZINE HCL 20 MG/ML IJ SOLN
10.0000 mg | Freq: Four times a day (QID) | INTRAMUSCULAR | Status: DC | PRN
  Administered 2018-06-20 – 2018-06-24 (×5): 10 mg via INTRAVENOUS
  Filled 2018-06-20 (×6): qty 1

## 2018-06-20 MED ORDER — PENTAFLUOROPROP-TETRAFLUOROETH EX AERO: 1.0000 "application " | INHALATION_SPRAY | CUTANEOUS | Status: DC | PRN

## 2018-06-20 MED ORDER — GLUCOSE 40 % PO GEL
2.0000 | ORAL | Status: AC
  Filled 2018-06-20: qty 2

## 2018-06-20 MED ORDER — CHLORHEXIDINE GLUCONATE CLOTH 2 % EX PADS: 6.0000 | MEDICATED_PAD | Freq: Every day | CUTANEOUS | Status: DC

## 2018-06-20 MED ORDER — LORAZEPAM 2 MG/ML IJ SOLN
INTRAMUSCULAR | Status: AC
  Administered 2018-06-20: 1 mg via INTRAVENOUS
  Filled 2018-06-20: qty 1

## 2018-06-20 MED ORDER — SODIUM CHLORIDE 0.9% FLUSH: 10.0000 mL | INTRAVENOUS | Status: DC | PRN

## 2018-06-20 MED ORDER — LIDOCAINE HCL (PF) 1 % IJ SOLN
INTRAMUSCULAR | Status: AC | PRN
  Administered 2018-06-20: 10 mL

## 2018-06-20 MED ORDER — LORAZEPAM 2 MG/ML IJ SOLN
0.5000 mg | Freq: Once | INTRAMUSCULAR | Status: AC
  Administered 2018-06-20: 0.5 mg via INTRAVENOUS
  Filled 2018-06-20: qty 1

## 2018-06-20 MED ORDER — HALOPERIDOL LACTATE 5 MG/ML IJ SOLN
2.0000 mg | Freq: Four times a day (QID) | INTRAMUSCULAR | Status: DC | PRN
  Administered 2018-06-20 – 2018-06-21 (×4): 2 mg via INTRAVENOUS
  Filled 2018-06-20 (×5): qty 1

## 2018-06-20 MED ORDER — NITROGLYCERIN 2 % TD OINT
1.0000 [in_us] | TOPICAL_OINTMENT | Freq: Four times a day (QID) | TRANSDERMAL | Status: DC
  Administered 2018-06-20: 1 [in_us] via TOPICAL
  Filled 2018-06-20: qty 30

## 2018-06-20 MED ORDER — ALTEPLASE 2 MG IJ SOLR: 2.0000 mg | Freq: Once | INTRAMUSCULAR | Status: DC | PRN

## 2018-06-20 MED ORDER — LACTATED RINGERS IV SOLN
INTRAVENOUS | Status: AC
  Administered 2018-06-20: 1000 mL via INTRAVENOUS

## 2018-06-20 MED ORDER — HEPARIN SODIUM (PORCINE) 1000 UNIT/ML DIALYSIS
3000.0000 [IU] | Freq: Once | INTRAMUSCULAR | Status: AC
  Administered 2018-06-20: 3000 [IU] via INTRAVENOUS_CENTRAL

## 2018-06-20 MED ORDER — MORPHINE SULFATE (PF) 2 MG/ML IV SOLN
2.0000 mg | Freq: Once | INTRAVENOUS | Status: AC
  Administered 2018-06-21: 2 mg via INTRAVENOUS
  Filled 2018-06-20: qty 1

## 2018-06-20 MED ORDER — HEPARIN SODIUM (PORCINE) 1000 UNIT/ML DIALYSIS: 1000.0000 [IU] | INTRAMUSCULAR | Status: DC | PRN

## 2018-06-20 MED ORDER — LABETALOL HCL 5 MG/ML IV SOLN
10.0000 mg | INTRAVENOUS | Status: DC | PRN
  Administered 2018-06-20 (×3): 10 mg via INTRAVENOUS
  Filled 2018-06-20 (×3): qty 4

## 2018-06-20 MED ORDER — CLONIDINE HCL 0.2 MG/24HR TD PTWK
0.2000 mg | MEDICATED_PATCH | TRANSDERMAL | Status: DC
  Administered 2018-06-20: 0.2 mg via TRANSDERMAL
  Filled 2018-06-20: qty 1

## 2018-06-20 MED ORDER — LIDOCAINE HCL (PF) 1 % IJ SOLN: 5.0000 mL | INTRAMUSCULAR | Status: DC | PRN

## 2018-06-20 MED ORDER — HEPARIN SODIUM (PORCINE) 1000 UNIT/ML IJ SOLN
INTRAMUSCULAR | Status: AC
  Administered 2018-06-20: 3000 [IU] via INTRAVENOUS_CENTRAL
  Filled 2018-06-20: qty 3

## 2018-06-20 MED ORDER — DEXTROSE 10 % IV SOLN
INTRAVENOUS | Status: DC
  Administered 2018-06-20: 03:00:00 via INTRAVENOUS

## 2018-06-20 MED ORDER — LIDOCAINE-PRILOCAINE 2.5-2.5 % EX CREA
1.0000 "application " | TOPICAL_CREAM | CUTANEOUS | Status: DC | PRN
  Filled 2018-06-20: qty 5

## 2018-06-20 MED ORDER — AMLODIPINE BESYLATE 10 MG PO TABS: 10.0000 mg | ORAL_TABLET | Freq: Every day | ORAL | Status: DC

## 2018-06-20 MED ORDER — NICARDIPINE HCL IN NACL 20-0.86 MG/200ML-% IV SOLN
3.0000 mg/h | INTRAVENOUS | Status: DC
  Administered 2018-06-20: 5 mg/h via INTRAVENOUS
  Administered 2018-06-20 – 2018-06-21 (×2): 7.5 mg/h via INTRAVENOUS
  Administered 2018-06-21: 5 mg/h via INTRAVENOUS
  Filled 2018-06-20 (×5): qty 200

## 2018-06-20 MED ORDER — LABETALOL HCL 5 MG/ML IV SOLN: 0.5000 mg/min | INTRAVENOUS | Status: DC

## 2018-06-20 MED ORDER — SODIUM CHLORIDE 0.9% FLUSH
10.0000 mL | Freq: Two times a day (BID) | INTRAVENOUS | Status: DC
  Administered 2018-06-20 – 2018-06-22 (×3): 10 mL
  Administered 2018-06-22: 20 mL
  Administered 2018-06-23 (×2): 10 mL
  Administered 2018-06-24: 20 mL

## 2018-06-20 MED ORDER — INSULIN GLARGINE 100 UNIT/ML ~~LOC~~ SOLN
10.0000 [IU] | Freq: Every day | SUBCUTANEOUS | Status: DC
  Administered 2018-06-20: 10 [IU] via SUBCUTANEOUS
  Filled 2018-06-20: qty 0.1

## 2018-06-20 MED ORDER — LIDOCAINE HCL 1 % IJ SOLN
INTRAMUSCULAR | Status: AC
  Filled 2018-06-20: qty 20

## 2018-06-20 NOTE — Progress Notes (Signed)
Inpatient Diabetes Program Recommendations  AACE/ADA: New Consensus Statement on Inpatient Glycemic Control   Target Ranges:  Prepandial:   less than 140 mg/dL      Peak postprandial:   less than 180 mg/dL (1-2 hours)      Critically ill patients:  140 - 180 mg/dL   Results for Carrie [name], Carrie [name] (MRN [id number]) as of 06/20/2018 11:31  Ref. Range 06/20/2018 00:39 06/20/2018 01:34 06/20/2018 02:40 06/20/2018 03:30 06/20/2018 04:35 06/20/2018 05:32 06/20/2018 09:25  Glucose-Capillary Latest Ref Range: 70 - 99 mg/dL 109 (H) 98 101 (H) 120 (H) 100 (H) 131 (H) 162 (H)  Results for Carrie [name], Carrie [name] (MRN [id number]) as of 06/20/2018 11:31  Ref. Range 06/19/2018 16:20  Glucose Latest Ref Range: 70 - 99 mg/dL 810 (HH)   Review of Glycemic Control  Diabetes history: DM1 (makes NO insulin; requires basal, correction, and meal coverage insulin) Outpatient Diabetes medications: 70/30 15 units BID Current orders for Inpatient glycemic control: Lantus 10 units QHS, Novolog 0-15 units TID with meals, Novolog 0-5 units QHS  Inpatient Diabetes Program Recommendations:   Insulin - Basal: Noted patient is ordered Lantus 10 units QHS and received Lantus 10 units at 4:29 am today at time of transition from IV to SQ insulin. Anticipate patient will require more basal insulin but will watch as ordered since patient will receive another dose of Lantus 10 units at bedtime today.  Correction (SSI): Patient has Type 1 DM and ESRD. Please decrease Novolog correction to 0-9 units (sensitive scale) TID with meals.  Insulin - Meal Coverage: If patient is eating and post prandial glucose is consistently greater than 180 mg/dl, please consider ordering Novolog 3 units TID with meals for meal coverage if patient eats at least 50% of meals.  Thanks, Barnie Alderman, RN, MSN, CDE Diabetes Coordinator Inpatient Diabetes Program [phone] (Team Pager from 8am to 5pm)

## 2018-06-20 NOTE — Plan of Care (Signed)
  Problem: Clinical Measurements: Goal: Ability to maintain clinical measurements within normal limits will improve Outcome: Progressing   

## 2018-06-20 NOTE — Consult Note (Addendum)
Neurology Consultation  Reason for Consult: Altered mental status with possible meningitis Referring Physician: Candiss Norse    History is obtained from: Mother and chart  HPI: Carrie [name] is a 38 y.o. female with past medical history of diabetes, trigeminal neuralgia, renal insufficiency, hypertension, HD, headache, gastroparesis, diabetic retinopathy.   Patient initially was brought to the hospital secondary to developing chest pain and shortness of breath after 20-minute session of hemodialysis.  She was noted to be in DKA.  Patient has remained encephalopathic since yesterday despite correction of her hyperglycemia has been complaining of a headache.  Neurology was consulted for encephalopathy.  At this time she is  lethargic, not following commands and seems agitated when attempted to wake up. Not able to count fingers or tell colors.  Currently her blood pressure is very elevated.  Per mother at that side her blood pressure has been elevated for the past couple days.    Recent blood pressures: 207/109, 210/105, 215/104, 151/92, 224/29 and this is been the trend for the last 24 hours.  CT head which was performed yesterday showed no acute findings  ROS: Unable to obtain due to altered mental status.   Past Medical History:  Diagnosis Date  . Anemia   . Diabetic neuropathy ([place])   . Diabetic retinopathy ([place])   . ESRD (end stage renal disease) on dialysis [facility])    "MWF; [place]" (02/02/2017)  . Gastroparesis   . HA (headache)    "qd if I didn't take my RX" (02/02/2017)  . Hemodialysis access site with arteriovenous graft ([place])   . Hypertension   . Neuropathy   . Renal insufficiency   . Trigeminal neuralgia   . Type I diabetes mellitus ([facility])     Family History  Problem Relation Age of Onset  . Hyperlipidemia Mother   . Asthma Daughter   . Diabetes Maternal Grandmother   . Stroke Maternal Grandmother   . Kidney disease Maternal Grandfather   . Kidney disease Paternal Grandmother     Social History:   reports that she has never smoked. She has never used smokeless tobacco. She reports current alcohol use of about 4.0 standard drinks of alcohol per week. She reports that she does not use drugs.  Medications  Current Facility-Administered Medications:  .  amLODipine (NORVASC) tablet 10 mg, 10 mg, Oral, QHS, Singh, Prashant K, MD .  carbamazepine (TEGRETOL) tablet 200 mg, 200 mg, Oral, QHS, Gilles Chiquito B, MD .  Chlorhexidine Gluconate Cloth 2 % PADS 6 each, 6 each, Topical, Q0600, Dwana Melena, MD .  cloNIDine (CATAPRES - Dosed in mg/24 hr) patch 0.2 mg, 0.2 mg, Transdermal, Weekly, Thurnell Lose, MD, 0.2 mg at 06/20/18 1330 .  dextrose (GLUTOSE) 40 % oral gel 75 g, 2 Tube, Oral, STAT, Kirby-Graham, Karsten Fells, NP .  ferric citrate (AURYXIA) tablet 420 mg, 420 mg, Oral, TID WC, Sid Falcon, MD, Stopped at 06/20/18 1207 .  haloperidol lactate (HALDOL) injection 2 mg, 2 mg, Intravenous, Q6H PRN, Thurnell Lose, MD, 2 mg at 06/20/18 1608 .  heparin injection 5,000 Units, 5,000 Units, Subcutaneous, Q8H, Gilles Chiquito B, MD, 5,000 Units at 06/20/18 1541 .  hydrALAZINE (APRESOLINE) injection 10 mg, 10 mg, Intravenous, Q6H PRN, Thurnell Lose, MD, 10 mg at 06/20/18 1528 .  insulin aspart (novoLOG) injection 0-15 Units, 0-15 Units, Subcutaneous, TID WC, Thurnell Lose, MD, 3 Units at 06/20/18 1641 .  insulin glargine (LANTUS) injection 5 Units, 5 Units, Subcutaneous, QHS, Singh,  Margaree Mackintosh, MD .  labetalol (NORMODYNE) tablet 300 mg, 300 mg, Oral, Once per day on Mon Wed Fri **AND** labetalol (NORMODYNE) tablet 300 mg, 300 mg, Oral, 2 times per day on Sun Tue Thu Sat, Mullen, Emily B, MD .  labetalol (NORMODYNE,TRANDATE) injection 10 mg, 10 mg, Intravenous, Q4H PRN, Gardiner Barefoot, NP, 10 mg at 06/20/18 1654 .  lactated ringers infusion, , Intravenous, Continuous, Thurnell Lose, MD, Last Rate: 50 mL/hr at 06/20/18 1523, 1,000 mL at 06/20/18 1523 .   lidocaine (XYLOCAINE) 1 % (with pres) injection, , , ,  .  nitroGLYCERIN (NITROGLYN) 2 % ointment 1 inch, 1 inch, Topical, Q6H, Singh, Prashant K, MD .  ondansetron (ZOFRAN) injection 4 mg, 4 mg, Intravenous, Q6H PRN, Sid Falcon, MD   Exam: Current vital signs: BP (!) 207/109   Pulse 83   Temp 99 F (37.2 C) (Oral)   Resp (!) 22   Ht 5\' 8"  (1.727 m)   Wt 84 kg   LMP 07/16/2016   SpO2 96%   BMI 28.16 kg/m  Vital signs in last 24 hours: Temp:  [98.2 F (36.8 C)-99 F (37.2 C)] 99 F (37.2 C) (03/05 1543) Pulse Rate:  [72-96] 83 (03/05 1710) Resp:  [13-27] 22 (03/05 1713) BP: (133-239)/(92-135) 207/109 (03/05 1710) SpO2:  [96 %-100 %] 96 % (03/05 1710)  Physical Exam  Constitutional: Appears well-developed and well-nourished.  Psych: Agitated and in discomfort Eyes: No scleral injection HENT: No OP obstrucion Head: Normocephalic.  Cardiovascular: Normal rate and regular rhythm.  Respiratory: Effort normal, non-labored breathing GI: Soft.  No distension. There is no tenderness.  Skin: WDI  Neuro: Mental Status: Patient is lethargic, with noxious stimuli will awake and does state that she is in the hospital.  Patient will not open her eyes and if she does it only for a brief period of time.  Following simple commands intermittently.  Cranial Nerves: II: Does not blink to threat appears to have no visual fields  III,IV, VI: Patient has positive doll's and pupils are reactive V: Facial sensation is symmetric to temperature VII: Facial movement is symmetric.  VIII: hearing is intact to voice  Motor: Moving all extremities antigravity and at times just writhing in bed Sensory: Draws from noxious stimuli in all 4 quadrants Deep Tendon Reflexes: Tendon reflexes are 2+ Plantars: Toes are downgoing bilaterally.  Cerebellar: Unable to obtain   Labs I have reviewed labs in epic and the results pertinent to this consultation are:   CBC    Component Value  Date/Time   WBC 5.5 06/19/2018 2155   RBC 3.66 (L) 06/19/2018 2155   HGB 11.1 (L) 06/19/2018 2155   HCT 33.8 (L) 06/19/2018 2155   PLT 136 (L) 06/19/2018 2155   MCV 92.3 06/19/2018 2155   MCV 96.5 10/30/2012 1908   MCH 30.3 06/19/2018 2155   MCHC 32.8 06/19/2018 2155   RDW 17.8 (H) 06/19/2018 2155   LYMPHSABS 0.5 (L) 06/19/2018 1445   MONOABS 0.2 06/19/2018 1445   EOSABS 0.0 06/19/2018 1445   BASOSABS 0.1 06/19/2018 1445    CMP     Component Value Date/Time   NA 132 (L) 06/20/2018 0214   K 3.7 06/20/2018 0214   CL 94 (L) 06/20/2018 0214   CO2 23 06/20/2018 0214   GLUCOSE 111 (H) 06/20/2018 0214   BUN 49 (H) 06/20/2018 0214   CREATININE 9.71 (H) 06/20/2018 0214   CREATININE 0.69 10/30/2012 1906   CALCIUM 9.5 06/20/2018 0214  PROT 6.7 06/19/2018 1620   ALBUMIN 3.8 06/19/2018 1620   AST 43 (H) 06/19/2018 1620   ALT 55 (H) 06/19/2018 1620   ALKPHOS 107 06/19/2018 1620   BILITOT 1.2 06/19/2018 1620   GFRNONAA 5 (L) 06/20/2018 0214   GFRAA 5 (L) 06/20/2018 0214    Lipid Panel     Component Value Date/Time   CHOL 179 05/07/2017 1114   TRIG 143 05/07/2017 1114   HDL 89 05/07/2017 1114   CHOLHDL 2.0 05/07/2017 1114   VLDL 29 05/07/2017 1114   LDLCALC 61 05/07/2017 1114     Imaging I have reviewed the images obtained:  CT-scan of the brain- no acute intracranial abnormalities.  MRI examination of the brain-pending  Etta Quill PA-C [facility] [phone]  M-F  (9:00 am- 5:00 PM)  06/20/2018, 5:30 PM     Assessment:  38 year old female with past medical history of type 1 diabetes mellitus, ESRD on HD, gastroparesis and hypertension admitted for DKA after presenting with chest pain.  She has been encephalopathic since her admission and has been complaining of headache.  Neurology was consulted today for evaluation of the symptoms despite being for DKA.  Her blood pressures have been in the 062-694 systolic range.  On examination patient is  lethargic, appears to be blind raising suspicion for posterior reversible encephalopathy syndrome.  Her CT head was performed yesterday, did not show any acute findings. Recommend starting patient on Cardene drip for aggressive blood pressure control and transferring to ICU.  Recommend MRI brain to confirm diagnosis, if unable to tolerate then just repeat CT head.   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  She is at risk for significant risk of neurological worsening from cerebral edema, heart failure,  respiratory failure and seizure. This patient's care requires constant monitoring of vital signs, hemodynamics, respiratory and cardiac monitoring, review of multiple databases, neurological assessment, discussion with family, other specialists and medical decision making of high complexity.  I spent 50 minutes of neurocritical time in the care of this patient.

## 2018-06-20 NOTE — Progress Notes (Addendum)
Patient will be transferred to ICU per MD order. Family at bedside aware about the transfer. Per patient's mom, patient has history of Trigeminal Myalgia.

## 2018-06-20 NOTE — Progress Notes (Addendum)
Patient back from HD. Alert and oriented x 1 (to person), restless, agitated. BP elevated 192/125 (145) - she received Labetalol 10 mg IV per PRN order. IV site lost. IV team notified. MD made aware. Will continue to monitor.

## 2018-06-20 NOTE — Progress Notes (Signed)
Patient no longer needs PIV placed per RN Alvis Lemmings

## 2018-06-20 NOTE — Progress Notes (Signed)
Patient in HD at this time. Unable to administer 8 AM medicine or to assess for non-violent restraints.

## 2018-06-20 NOTE — Progress Notes (Addendum)
Provider on call paged about patient being agitated and attempting to get out of bed, and not listening to safety instructions. Patient attempting to pull EJ iv. Patient removing monitoring equipment. New orders received for soft waist restraint, and bilateral wrist restraints. Left wrist restraint left off due to patient having fistula on that arm. New orders received for IV lorazepam. Will continue to monitor.

## 2018-06-20 NOTE — Procedures (Signed)
Patient cannot lie still for echo at this time per nursing.

## 2018-06-20 NOTE — Progress Notes (Signed)
Patient agitated, uncooperative with staff, trying to get out of bed. Continue soft restraints. MD made aware and per his verbal order patient receive Haldol 2 mg IV. Will continue to monitor.

## 2018-06-20 NOTE — Progress Notes (Addendum)
[facility] Progress Note   Subjective:   Seen and examined at bedside in HD.  Unable to obtain history/review of systems d/t patient being sedated. Patient sedated and in soft restraints due to combative behavior and concern for safety.  Objective Vitals:   06/20/18 0800 06/20/18 0838 06/20/18 0900 06/20/18 0930  BP: (!) 213/126 (!) 216/120 (!) 216/121 (!) 225/135  Pulse: 80 83 85   Resp:      Temp:      SpO2:      Weight:      Height:       Physical Exam General:NAD, sedated, laying in bed Heart:RRR, no mrg Lungs:hard to appreciate, mostly CTAB Abdomen:soft, ND, +BS Extremities:no LE edema Dialysis Access: LU AVG cannulated  Filed Weights   06/19/18 1203  Weight: 84 kg    Intake/Output Summary (Last 24 hours) at 06/20/2018 0940 Last data filed at 06/20/2018 0700 Gross per 24 hour  Intake 570.26 ml  Output -  Net 570.26 ml    Additional Objective Labs: Basic Metabolic Panel: Recent Labs  Lab 06/19/18 1620 06/19/18 2155 06/20/18 0214  NA 127* 132* 132*  K 4.1 3.7 3.7  CL 87* 91* 94*  CO2 22 18* 23  GLUCOSE 810* 376* 111*  BUN 43* 49* 49*  CREATININE 9.56* 9.95* 9.71*  CALCIUM 9.1 9.8 9.5   Liver Function Tests: Recent Labs  Lab 06/19/18 1620  AST 43*  ALT 55*  ALKPHOS 107  BILITOT 1.2  PROT 6.7  ALBUMIN 3.8   Recent Labs  Lab 06/19/18 1620  LIPASE 25   CBC: Recent Labs  Lab 06/19/18 1445 06/19/18 2155  WBC 5.1 5.5  NEUTROABS 4.3  --   HGB 11.3* 11.1*  HCT 35.0* 33.8*  MCV 95.4 92.3  PLT 135* 136*    Cardiac Enzymes: Recent Labs  Lab 06/19/18 1620 06/19/18 2155 06/20/18 0409  TROPONINI 0.06* 0.07* 0.11*   CBG: Recent Labs  Lab 06/20/18 0134 06/20/18 0240 06/20/18 0330 06/20/18 0435 06/20/18 0532  GLUCAP 98 101* 120* 100* 131*   Studies/Results: Dg Chest 1 View  Result Date: 06/19/2018 CLINICAL DATA:  Patient fell on weighted dialysis.  Dyspnea. EXAM: CHEST  1 VIEW COMPARISON:  05/28/2018 FINDINGS:  Stable cardiomegaly. Nonaneurysmal thoracic aorta. Mild vascular congestion with low lung volumes. No pulmonary contusion, effusion or pneumothorax. No acute displaced rib fractures. IMPRESSION: Cardiomegaly with low lung volumes.  Mild vascular congestion. Electronically Signed   By: Ashley Royalty M.D.   On: 06/19/2018 18:27   Ct Head Wo Contrast  Result Date: 06/19/2018 CLINICAL DATA:  38 year old female with headache and knee pain after slipping and falling on her way and dialysis earlier today. EXAM: CT HEAD WITHOUT CONTRAST CT CERVICAL SPINE WITHOUT CONTRAST TECHNIQUE: Multidetector CT imaging of the head and cervical spine was performed following the standard protocol without intravenous contrast. Multiplanar CT image reconstructions of the cervical spine were also generated. COMPARISON:  Prior brain MRI 11/21/2016; prior CT scan of the head 11/14/2013 FINDINGS: CT HEAD FINDINGS Brain: No evidence of acute infarction, hemorrhage, hydrocephalus, extra-axial collection or mass lesion/mass effect. Age advanced cerebral and cerebellar cortical atrophy. The cerebellar atrophy is predominantly midline and inferior with mild hypoplasia of the vermis. 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 These findings are unchanged compared to prior imaging from August of 2018. Vascular: No hyperdense vessel or unexpected calcification. Skull: Normal. Negative for fracture or focal lesion. Sinuses/Orbits: Mucous retention cyst present within the left sphenoid air cell. Other:  None. CT CERVICAL SPINE FINDINGS Alignment: Normal. Skull base and vertebrae: No acute fracture. No primary bone lesion or focal pathologic process. Soft tissues and spinal canal: No prevertebral fluid or swelling. No visible canal hematoma. Disc levels:  No level specific disease. Upper chest: Negative. Other: None IMPRESSION: CT HEAD 1. No acute intracranial abnormality. 2. Stable age advanced cerebral and cerebellar atrophy without  significant interval progression compared to prior MRI dated 11/21/2016. CT CSPINE 1. No acute fracture or malalignment. Electronically Signed   By: Jacqulynn Cadet M.D.   On: 06/19/2018 16:03   Ct Cervical Spine Wo Contrast  Result Date: 06/19/2018 CLINICAL DATA:  38 year old female with headache and knee pain after slipping and falling on her way and dialysis earlier today. EXAM: CT HEAD WITHOUT CONTRAST CT CERVICAL SPINE WITHOUT CONTRAST TECHNIQUE: Multidetector CT imaging of the head and cervical spine was performed following the standard protocol without intravenous contrast. Multiplanar CT image reconstructions of the cervical spine were also generated. COMPARISON:  Prior brain MRI 11/21/2016; prior CT scan of the head 11/14/2013 FINDINGS: CT HEAD FINDINGS Brain: No evidence of acute infarction, hemorrhage, hydrocephalus, extra-axial collection or mass lesion/mass effect. Age advanced cerebral and cerebellar cortical atrophy. The cerebellar atrophy is predominantly midline and inferior with mild hypoplasia of the vermis. 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 These findings are unchanged compared to prior imaging from August of 2018. Vascular: No hyperdense vessel or unexpected calcification. Skull: Normal. Negative for fracture or focal lesion. Sinuses/Orbits: Mucous retention cyst present within the left sphenoid air cell. Other: None. CT CERVICAL SPINE FINDINGS Alignment: Normal. Skull base and vertebrae: No acute fracture. No primary bone lesion or focal pathologic process. Soft tissues and spinal canal: No prevertebral fluid or swelling. No visible canal hematoma. Disc levels:  No level specific disease. Upper chest: Negative. Other: None IMPRESSION: CT HEAD 1. No acute intracranial abnormality. 2. Stable age advanced cerebral and cerebellar atrophy without significant interval progression compared to prior MRI dated 11/21/2016. CT CSPINE 1. No acute fracture or malalignment.  Electronically Signed   By: Jacqulynn Cadet M.D.   On: 06/19/2018 16:03   Dg Knee Complete 4 Views Right  Result Date: 06/19/2018 CLINICAL DATA:  Right knee pain after fall while going to dialysis. EXAM: RIGHT KNEE - COMPLETE 4+ VIEW COMPARISON:  None. FINDINGS: No evidence of fracture, dislocation, or joint effusion. No evidence of arthropathy or other focal bone abnormality. Soft tissues are unremarkable. IMPRESSION: Negative. Electronically Signed   By: Ashley Royalty M.D.   On: 06/19/2018 18:30    Medications: . sodium chloride    . sodium chloride 50 mL/hr at 06/20/18 0650   . amLODipine  10 mg Oral QHS  . carbamazepine  200 mg Oral QHS  . Chlorhexidine Gluconate Cloth  6 each Topical Q0600  . dextrose  2 Tube Oral STAT  . ferric citrate  420 mg Oral TID WC  . heparin      . heparin  3,000 Units Dialysis Once in dialysis  . heparin  5,000 Units Subcutaneous Q8H  . insulin aspart  0-15 Units Subcutaneous TID WC  . insulin aspart  0-5 Units Subcutaneous QHS  . insulin glargine  10 Units Subcutaneous QHS  . labetalol  300 mg Oral Once per day on Mon Wed Fri   And  . labetalol  300 mg Oral 2 times per day on Sun Tue Thu Sat  . LORazepam      . LORazepam  1 mg Intravenous  Once  . metoCLOPramide (REGLAN) injection  10 mg Intravenous Q8H    Dialysis Orders: [facility]  MWF  T4hr  180NR EDW 84kg (last achieved 3/2) Heparin 3000 units bolus   LU AVG Calcitriol 1.75 PO TIW Aranesp 150 q2weeks (last given 2/19)  Assessment/Plan: 1. DKA vs HHS - BS>600 on arrival, well controlled at this time.  Labs improved. per primary 2. Encephalopathy - Hypertensive encephalopathy vs Meds vs DKA/HHS - combative in HD today requiring sedation d/t concerns for safety. 3. HTN emergency- BP significantly elevated. On home meds, amlodipine, labetalol.   4. Chest pain - cardiology consulting.  5. ESRD - HD today off schedule. Sedated d/t combative behavior. Plans for HD tomorrow per regular schedule. K 3.7.   6. Anemia of CKD- Hgb 11.1. No indication for ESA at this time.  Monitor.  7. Secondary hyperparathyroidism - Ca at goal. Will check phos.  Continue VDRA and binders. 8. Volume - Does not appear grossly volume overloaded.  Will continue to titrate down volume as tolerated.  Need standing weights when able.  At dry wt 9. Nutrition - Renal diet w/fluid restrictions. Protein supplements, renal vitamins 10. Gastroparesis   [name], PA-C [facility] Pager: [phone] 06/20/2018,9:40 AM  LOS: 1 day   Pt seen, examined and agree w A/P as above.  [facility] 06/20/2018, 12:12 PM

## 2018-06-20 NOTE — Progress Notes (Signed)
BP came up again at 225/135; per PRN orders patient received Labetalol 10 mg IV. BP still elevated - 207/109. MD notified. Per MD order patient will received Nitroglyn 1 inch. Will continue to monitor.

## 2018-06-20 NOTE — Procedures (Signed)
LIJV tunneled PICC SVC RA EBL 0 Comp 0

## 2018-06-20 NOTE — Progress Notes (Addendum)
Patient was transferred to ICU. Report was given to the nurse who is going to receive the patient. The nurse was notified about patient's having history of Trigeminal Myalgia.

## 2018-06-20 NOTE — Progress Notes (Addendum)
PROGRESS NOTE                                                                                                                                                                                                             Patient Demographics:    Carrie [name], is a 38 y.o. female, DOB - 1980-12-25, JKK:[id number]  Admit date - 06/19/2018   Admitting Physician Sid Falcon, MD  Outpatient Primary MD for the patient is Glendon Axe, MD  LOS - 1  Chief Complaint  Patient presents with  . Migraine  . Shortness of Breath       Brief Narrative  Carrie [name] is a 38 y.o. female with medical history significant of DM1, ESRD on HD, HTN, gastroparesis.  She presents from HD today where it is reported that she developed CP and SOB after about 20 min of the session.  She was taken off of HD and then tripped and fell and hit her head.  She was then transported to the ED.  By the time I saw the patient, she had received ativan and dilaudid for a headache and she was not lucid.  She was also noted to have a very high blood sugar concerning for DKA/HHS.   Subjective:    Carrie [name] today somewhat sedated in bed as she received Ativan IV during dialysis few hours ago, appears to be in no distress, able to answer basic questions and denies any headache, she says she is comfortable.  Her neck was supple and she was able to move her arms and legs to painful stimuli.   Assessment  & Plan :     1.  DKA.  Resolved, currently on insulin sliding scale and Lantus.  Will monitor CBGs closely.  History of poor outpatient control due to hyperglycemia.  Lab Results  Component Value Date   HGBA1C 8.6 (A) 11/06/2017   CBG (last 3)  Recent Labs    06/20/18 0532 06/20/18 0925 06/20/18 1202  GLUCAP 131* 162* 164*    2.  ESRD.  Nephrology on board was dialyzed 06/20/2018.  3.  Hypertension/hypertensive urgency.  In poor control, currently unable to take oral medications due to encephalopathy  caused by medications, Catapres patch ordered, PRN IV labetalol and hydralazine ( IV access lost - called IR for stat IV access), unable to PO meds due to encephalopathy.  Addendum at 5. 25 pm - despite above BP Rx BP still high, DW mother apparently patient had headache x  2 days and BP was high at PCP office, ? PRES, have C Line access now, will start [place], also will get Neuro inout as well. ICU for Cardene, DW Dr Lavina Hamman PCCM as well.  NOTE - Pts BP 1/10/202, 05/2018 ER visits - SBP 200/110 range, poorly controlled at baseline.  4.  Lack of IV access.  Consulted IR to place central line, ASAP.  5.  Chest pain.  Appears to be atypical, troponin largely unremarkable, flat and and non-ACS pattern, EKG nonacute, echo ordered to evaluate wall motion and EF.  6.  Encephalopathy.  Due to combination of #1 above along with possible hypertensive urgency/crisis.  Plan as a #3 and #1 above.  CT negative, denies any headache.  7.  Diabetic gastroparesis.  PRN medications once taking oral diet.     Family Communication  : none  Code Status :  Full  Disposition Plan  :  Step down  Consults  :  Renal, IR  Procedures  :    C Line by IR - 06/20/18  CT HEAD 1. No acute intracranial abnormality. 2. Stable age advanced cerebral and cerebellar atrophy without significant interval progression compared to prior MRI dated 11/21/2016. CT CSPINE 1. No acute fracture or malalignment.  DVT Prophylaxis  :   Heparin    Lab Results  Component Value Date   PLT 136 (L) 06/19/2018    Diet :  Diet Order            Diet renal with fluid restriction Fluid restriction: 1200 mL Fluid; Room service appropriate? Yes; Fluid consistency: Thin  Diet effective now               Inpatient Medications Scheduled Meds: . amLODipine  10 mg Oral QHS  . carbamazepine  200 mg Oral QHS  . Chlorhexidine Gluconate Cloth  6 each Topical Q0600  . cloNIDine  0.2 mg Transdermal Weekly  . dextrose  2 Tube Oral STAT  .  ferric citrate  420 mg Oral TID WC  . heparin  5,000 Units Subcutaneous Q8H  . insulin aspart  0-15 Units Subcutaneous TID WC  . insulin aspart  0-5 Units Subcutaneous QHS  . insulin glargine  10 Units Subcutaneous QHS  . labetalol  300 mg Oral Once per day on Mon Wed Fri   And  . labetalol  300 mg Oral 2 times per day on Sun Tue Thu Sat   Continuous Infusions: . lactated ringers     PRN Meds:.hydrALAZINE, labetalol, ondansetron (ZOFRAN) IV  Antibiotics  :   Anti-infectives (From admission, onward)   None          Objective:   Vitals:   06/20/18 1000 06/20/18 1030 06/20/18 1100 06/20/18 1203  BP: (!) 224/112 (!) 204/112 (!) 239/124 (!) 192/125  Pulse: 85 84 87 86  Resp:    (!) 25  Temp:    98.5 F (36.9 C)  TempSrc:    Axillary  SpO2:    100%  Weight:      Height:        Wt Readings from Last 3 Encounters:  06/19/18 84 kg  05/18/18 86.6 kg  04/09/18 87.5 kg     Intake/Output Summary (Last 24 hours) at 06/20/2018 1319 Last data filed at 06/20/2018 0700 Gross per 24 hour  Intake 570.26 ml  Output -  Net 570.26 ml     Physical Exam  Somnolent after she received IV Ativan at dialysis, does not appear to be in  any distress, answers basic questions, supple neck, moves all 4 extremities to painful stimuli [place].AT,PERRAL Supple Neck,No JVD, No cervical lymphadenopathy appriciated.  Symmetrical Chest wall movement, Good air movement bilaterally, CTAB RRR,No Gallops,Rubs or new Murmurs, No Parasternal Heave +ve B.Sounds, Abd Soft, No tenderness, No organomegaly appriciated, No rebound - guarding or rigidity. No Cyanosis, Clubbing or edema, No new Rash or bruise       Data Review:    CBC Recent Labs  Lab 06/19/18 1445 06/19/18 2155  WBC 5.1 5.5  HGB 11.3* 11.1*  HCT 35.0* 33.8*  PLT 135* 136*  MCV 95.4 92.3  MCH 30.8 30.3  MCHC 32.3 32.8  RDW 17.5* 17.8*  LYMPHSABS 0.5*  --   MONOABS 0.2  --   EOSABS 0.0  --   BASOSABS 0.1  --     Chemistries    Recent Labs  Lab 06/19/18 1620 06/19/18 2155 06/20/18 0214  NA 127* 132* 132*  K 4.1 3.7 3.7  CL 87* 91* 94*  CO2 22 18* 23  GLUCOSE 810* 376* 111*  BUN 43* 49* 49*  CREATININE 9.56* 9.95* 9.71*  CALCIUM 9.1 9.8 9.5  AST 43*  --   --   ALT 55*  --   --   ALKPHOS 107  --   --   BILITOT 1.2  --   --    ------------------------------------------------------------------------------------------------------------------ No results for input(s): CHOL, HDL, LDLCALC, TRIG, CHOLHDL, LDLDIRECT in the last 72 hours.  Lab Results  Component Value Date   HGBA1C 8.6 (A) 11/06/2017   ------------------------------------------------------------------------------------------------------------------ No results for input(s): TSH, T4TOTAL, T3FREE, THYROIDAB in the last 72 hours.  Invalid input(s): FREET3 ------------------------------------------------------------------------------------------------------------------ No results for input(s): VITAMINB12, FOLATE, FERRITIN, TIBC, IRON, RETICCTPCT in the last 72 hours.  Coagulation profile No results for input(s): INR, PROTIME in the last 168 hours.  No results for input(s): DDIMER in the last 72 hours.  Cardiac Enzymes Recent Labs  Lab 06/19/18 2155 06/20/18 0409 06/20/18 0927  TROPONINI 0.07* 0.11* 0.10*   ------------------------------------------------------------------------------------------------------------------ No results found for: BNP  Micro Results Recent Results (from the past 240 hour(s))  MRSA PCR Screening     Status: None   Collection Time: 06/20/18  6:05 AM  Result Value Ref Range Status   MRSA by PCR NEGATIVE NEGATIVE Final    Comment:        The GeneXpert MRSA Assay (FDA approved for NASAL specimens only), is one component of a comprehensive MRSA colonization surveillance program. It is not intended to diagnose MRSA infection nor to guide or monitor treatment for MRSA infections. Performed at [facility], [place] [address], [place], [place] [address]     Radiology Reports Dg Chest 1 View  Result Date: 06/19/2018 CLINICAL DATA:  Patient fell on weighted dialysis.  Dyspnea. EXAM: CHEST  1 VIEW COMPARISON:  05/28/2018 FINDINGS: Stable cardiomegaly. Nonaneurysmal thoracic aorta. Mild vascular congestion with low lung volumes. No pulmonary contusion, effusion or pneumothorax. No acute displaced rib fractures. IMPRESSION: Cardiomegaly with low lung volumes.  Mild vascular congestion. Electronically Signed   By: Ashley Royalty M.D.   On: 06/19/2018 18:27   Ct Head Wo Contrast  Result Date: 06/19/2018 CLINICAL DATA:  38 year old female with headache and knee pain after slipping and falling on her way and dialysis earlier today. EXAM: CT HEAD WITHOUT CONTRAST CT CERVICAL SPINE WITHOUT CONTRAST TECHNIQUE: Multidetector CT imaging of the head and cervical spine was performed following the standard protocol without intravenous contrast. Multiplanar CT image reconstructions  of the cervical spine were also generated. COMPARISON:  Prior brain MRI 11/21/2016; prior CT scan of the head 11/14/2013 FINDINGS: CT HEAD FINDINGS Brain: No evidence of acute infarction, hemorrhage, hydrocephalus, extra-axial collection or mass lesion/mass effect. Age advanced cerebral and cerebellar cortical atrophy. The cerebellar atrophy is predominantly midline and inferior with mild hypoplasia of the vermis. 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 These findings are unchanged compared to prior imaging from August of 2018. Vascular: No hyperdense vessel or unexpected calcification. Skull: Normal. Negative for fracture or focal lesion. Sinuses/Orbits: Mucous retention cyst present within the left sphenoid air cell. Other: None. CT CERVICAL SPINE FINDINGS Alignment: Normal. Skull base and vertebrae: No acute fracture. No primary bone lesion or focal pathologic process. Soft tissues and spinal canal: No prevertebral fluid or  swelling. No visible canal hematoma. Disc levels:  No level specific disease. Upper chest: Negative. Other: None IMPRESSION: CT HEAD 1. No acute intracranial abnormality. 2. Stable age advanced cerebral and cerebellar atrophy without significant interval progression compared to prior MRI dated 11/21/2016. CT CSPINE 1. No acute fracture or malalignment. Electronically Signed   By: Jacqulynn Cadet M.D.   On: 06/19/2018 16:03   Ct Cervical Spine Wo Contrast  Result Date: 06/19/2018 CLINICAL DATA:  38 year old female with headache and knee pain after slipping and falling on her way and dialysis earlier today. EXAM: CT HEAD WITHOUT CONTRAST CT CERVICAL SPINE WITHOUT CONTRAST TECHNIQUE: Multidetector CT imaging of the head and cervical spine was performed following the standard protocol without intravenous contrast. Multiplanar CT image reconstructions of the cervical spine were also generated. COMPARISON:  Prior brain MRI 11/21/2016; prior CT scan of the head 11/14/2013 FINDINGS: CT HEAD FINDINGS Brain: No evidence of acute infarction, hemorrhage, hydrocephalus, extra-axial collection or mass lesion/mass effect. Age advanced cerebral and cerebellar cortical atrophy. The cerebellar atrophy is predominantly midline and inferior with mild hypoplasia of the vermis. 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 These findings are unchanged compared to prior imaging from August of 2018. Vascular: No hyperdense vessel or unexpected calcification. Skull: Normal. Negative for fracture or focal lesion. Sinuses/Orbits: Mucous retention cyst present within the left sphenoid air cell. Other: None. CT CERVICAL SPINE FINDINGS Alignment: Normal. Skull base and vertebrae: No acute fracture. No primary bone lesion or focal pathologic process. Soft tissues and spinal canal: No prevertebral fluid or swelling. No visible canal hematoma. Disc levels:  No level specific disease. Upper chest: Negative. Other: None IMPRESSION: CT  HEAD 1. No acute intracranial abnormality. 2. Stable age advanced cerebral and cerebellar atrophy without significant interval progression compared to prior MRI dated 11/21/2016. CT CSPINE 1. No acute fracture or malalignment. Electronically Signed   By: Jacqulynn Cadet M.D.   On: 06/19/2018 16:03   Dg Chest Portable 1 View  Result Date: 05/28/2018 CLINICAL DATA:  Flu-like symptoms EXAM: PORTABLE CHEST 1 VIEW COMPARISON:  05/18/2018 FINDINGS: Cardiac shadow is enlarged but stable. The lungs are well aerated bilaterally. No focal infiltrate or sizable effusion is seen. Minimal scarring is noted in the left mid lung stable from the prior exam. IMPRESSION: Stable cardiomegaly.  No acute abnormality noted. Electronically Signed   By: Inez Catalina M.D.   On: 05/28/2018 00:26   Dg Knee Complete 4 Views Right  Result Date: 06/19/2018 CLINICAL DATA:  Right knee pain after fall while going to dialysis. EXAM: RIGHT KNEE - COMPLETE 4+ VIEW COMPARISON:  None. FINDINGS: No evidence of fracture, dislocation, or joint effusion. No evidence of arthropathy or  other focal bone abnormality. Soft tissues are unremarkable. IMPRESSION: Negative. Electronically Signed   By: Ashley Royalty M.D.   On: 06/19/2018 18:30    Time Spent in minutes  30   Lala Lund M.D on 06/20/2018 at 1:19 PM  To page go to [email or web address] - password [facility]

## 2018-06-21 ENCOUNTER — Inpatient Hospital Stay (HOSPITAL_COMMUNITY): Payer: BLUE CROSS/BLUE SHIELD

## 2018-06-21 DIAGNOSIS — I340 Nonrheumatic mitral (valve) insufficiency: Secondary | ICD-10-CM

## 2018-06-21 LAB — GLUCOSE, CAPILLARY
Glucose-Capillary: 304 mg/dL — ABNORMAL HIGH (ref 70–99)
Glucose-Capillary: 184 mg/dL — ABNORMAL HIGH (ref 70–99)
Glucose-Capillary: 68 mg/dL — ABNORMAL LOW (ref 70–99)
Glucose-Capillary: 114 mg/dL — ABNORMAL HIGH (ref 70–99)
Glucose-Capillary: 115 mg/dL — ABNORMAL HIGH (ref 70–99)
Glucose-Capillary: 219 mg/dL — ABNORMAL HIGH (ref 70–99)

## 2018-06-21 LAB — BASIC METABOLIC PANEL
Anion gap: 15 (ref 5–15)
BUN: 36 mg/dL — ABNORMAL HIGH (ref 6–20)
CO2: 20 mmol/L — ABNORMAL LOW (ref 22–32)
Calcium: 8.9 mg/dL (ref 8.9–10.3)
Chloride: 100 mmol/L (ref 98–111)
Creatinine, Ser: 7.26 mg/dL — ABNORMAL HIGH (ref 0.44–1.00)
GFR calc Af Amer: 8 mL/min — ABNORMAL LOW (ref >60)
GFR calc non Af Amer: 7 mL/min — ABNORMAL LOW (ref >60)
Glucose, Bld: 205 mg/dL — ABNORMAL HIGH (ref 70–99)
Potassium: 3.8 mmol/L (ref 3.5–5.1)
Sodium: 135 mmol/L (ref 135–145)

## 2018-06-21 LAB — ECHOCARDIOGRAM COMPLETE
HEIGHTINCHES: 68 in
Weight: 2962.98 oz

## 2018-06-21 LAB — CBC
HCT: 29.6 % — ABNORMAL LOW (ref 36.0–46.0)
Hemoglobin: 9.6 g/dL — ABNORMAL LOW (ref 12.0–15.0)
MCH: 31 pg (ref 26.0–34.0)
MCHC: 32.4 g/dL (ref 30.0–36.0)
MCV: 95.5 fL (ref 80.0–100.0)
NRBC: 0 % (ref 0.0–0.2)
PLATELETS: 136 10*3/uL — AB (ref 150–400)
RBC: 3.1 MIL/uL — ABNORMAL LOW (ref 3.87–5.11)
RDW: 18.7 % — ABNORMAL HIGH (ref 11.5–15.5)
WBC: 8.9 10*3/uL (ref 4.0–10.5)

## 2018-06-21 LAB — HEMOGLOBIN A1C
Hgb A1c MFr Bld: 9.7 % — ABNORMAL HIGH (ref 4.8–5.6)
Mean Plasma Glucose: 231.69 mg/dL

## 2018-06-21 MED ORDER — LORAZEPAM 2 MG/ML IJ SOLN
2.0000 mg | Freq: Once | INTRAMUSCULAR | Status: AC
  Administered 2018-06-21: 2 mg via INTRAVENOUS
  Filled 2018-06-21: qty 1

## 2018-06-21 MED ORDER — GLUCOSE 40 % PO GEL
2.0000 | ORAL | Status: AC
  Filled 2018-06-21: qty 2

## 2018-06-21 MED ORDER — HALOPERIDOL LACTATE 5 MG/ML IJ SOLN
INTRAMUSCULAR | Status: AC
  Administered 2018-06-21: 5 mg
  Filled 2018-06-21: qty 1

## 2018-06-21 MED ORDER — LORAZEPAM 2 MG/ML IJ SOLN
INTRAMUSCULAR | Status: AC
  Filled 2018-06-21: qty 1

## 2018-06-21 MED ORDER — LORAZEPAM 2 MG/ML IJ SOLN
1.0000 mg | Freq: Once | INTRAMUSCULAR | Status: AC
  Administered 2018-06-21: 1 mg via INTRAVENOUS

## 2018-06-21 MED ORDER — HEPARIN SODIUM (PORCINE) 1000 UNIT/ML IJ SOLN
INTRAMUSCULAR | Status: AC
  Administered 2018-06-21: 3000 [IU] via INTRAVENOUS_CENTRAL
  Filled 2018-06-21: qty 3

## 2018-06-21 MED ORDER — HALOPERIDOL LACTATE 5 MG/ML IJ SOLN: 5.0000 mg | Freq: Once | INTRAMUSCULAR | Status: AC

## 2018-06-21 MED ORDER — INSULIN GLARGINE 100 UNIT/ML ~~LOC~~ SOLN: 10.0000 [IU] | Freq: Every day | SUBCUTANEOUS | Status: DC

## 2018-06-21 MED ORDER — HALOPERIDOL LACTATE 5 MG/ML IJ SOLN
2.5000 mg | Freq: Once | INTRAMUSCULAR | Status: AC
  Administered 2018-06-21: 2.5 mg via INTRAVENOUS

## 2018-06-21 MED ORDER — DEXTROSE 50 % IV SOLN
INTRAVENOUS | Status: AC
  Administered 2018-06-21: 12.5 g via INTRAVENOUS
  Filled 2018-06-21: qty 50

## 2018-06-21 MED ORDER — DEXMEDETOMIDINE HCL IN NACL 200 MCG/50ML IV SOLN
0.4000 ug/kg/h | INTRAVENOUS | Status: DC
  Administered 2018-06-21 (×2): 0.8 ug/kg/h via INTRAVENOUS
  Administered 2018-06-21 – 2018-06-22 (×2): 0.4 ug/kg/h via INTRAVENOUS
  Administered 2018-06-22: 0.8 ug/kg/h via INTRAVENOUS
  Administered 2018-06-22: 0.4 ug/kg/h via INTRAVENOUS
  Administered 2018-06-22: 0.8 ug/kg/h via INTRAVENOUS
  Administered 2018-06-22: 1.1 ug/kg/h via INTRAVENOUS
  Administered 2018-06-23: 0.5 ug/kg/h via INTRAVENOUS
  Filled 2018-06-21 (×10): qty 50

## 2018-06-21 MED ORDER — DEXTROSE 50 % IV SOLN
12.5000 g | INTRAVENOUS | Status: AC
  Administered 2018-06-21: 12.5 g via INTRAVENOUS

## 2018-06-21 MED ORDER — INSULIN ASPART 100 UNIT/ML ~~LOC~~ SOLN
4.0000 [IU] | Freq: Once | SUBCUTANEOUS | Status: AC
  Administered 2018-06-21: 4 [IU] via SUBCUTANEOUS

## 2018-06-21 MED ORDER — LACTATED RINGERS IV SOLN
INTRAVENOUS | Status: AC
  Administered 2018-06-21 – 2018-06-22 (×2): via INTRAVENOUS

## 2018-06-21 MED ORDER — INSULIN GLARGINE 100 UNIT/ML ~~LOC~~ SOLN
8.0000 [IU] | Freq: Every day | SUBCUTANEOUS | Status: DC
  Administered 2018-06-21 – 2018-06-24 (×4): 8 [IU] via SUBCUTANEOUS
  Filled 2018-06-21 (×4): qty 0.08

## 2018-06-21 MED ORDER — INSULIN ASPART 100 UNIT/ML ~~LOC~~ SOLN
0.0000 [IU] | SUBCUTANEOUS | Status: DC
  Administered 2018-06-21: 3 [IU] via SUBCUTANEOUS
  Administered 2018-06-21: 5 [IU] via SUBCUTANEOUS
  Administered 2018-06-22 (×2): 2 [IU] via SUBCUTANEOUS
  Administered 2018-06-22: 3 [IU] via SUBCUTANEOUS
  Administered 2018-06-22: 2 [IU] via SUBCUTANEOUS

## 2018-06-21 NOTE — Progress Notes (Signed)
Results for CAMBRY, SPAMPINATO (MRN [id number]) as of 06/21/2018 13:04  Ref. Range 06/20/2018 23:37 06/21/2018 03:17 06/21/2018 08:36 06/21/2018 12:02 06/21/2018 13:01  Glucose-Capillary Latest Ref Range: 70 - 99 mg/dL 293 (H) 304 (H) 184 (H) 68 (L) 114 (H)  Noted that blood sugar at noon was 68 mg/dl. Recommend decreasing Novolog correction scale to SENSITIVE (0-9 units) every 4 hours if NPO and TID & HS when eating if blood sugars continue to be less than 100 mg/dl.    Harvel Ricks RN BSN CDE Diabetes Coordinator Pager: [phone]  8am-5pm

## 2018-06-21 NOTE — Progress Notes (Signed)
  Echocardiogram 2D Echocardiogram has been performed.  Carrie [name] Teiara Baria 06/21/2018, 2:19 PM

## 2018-06-21 NOTE — Procedures (Addendum)
[place] A. Merlene Laughter, MD     [email or web address]           HISTORY: The patient is a 38 year old female who presents with acute encephalopathy.  There is a history of multiple medical comorbidities including complications of diabetes mellitus and renal impairment.  The study is being done to assess for nonconvulsive seizures and to assess brain function.  MEDICATIONS:  Current Facility-Administered Medications:  .  0.9 %  sodium chloride infusion, 100 mL, Intravenous, PRN, Penninger, Lindsay, PA .  alteplase (CATHFLO ACTIVASE) injection 2 mg, 2 mg, Intracatheter, Once PRN, Penninger, Ria Comment, PA .  Chlorhexidine Gluconate Cloth 2 % PADS 6 each, 6 each, Topical, Q0600, Dwana Melena, MD .  cloNIDine (CATAPRES - Dosed in mg/24 hr) patch 0.2 mg, 0.2 mg, Transdermal, Weekly, Thurnell Lose, MD, 0.2 mg at 06/20/18 1330 .  dexmedetomidine (PRECEDEX) 200 MCG/50ML (4 mcg/mL) infusion, 0.4-1.2 mcg/kg/hr, Intravenous, Titrated, Rush Farmer, MD, Last Rate: 14.82 mL/hr at 06/21/18 1700, 0.7 mcg/kg/hr at 06/21/18 1700 .  dextrose (GLUTOSE) 40 % oral gel 75 g, 2 Tube, Oral, STAT, Thurnell Lose, MD, Stopped at 06/21/18 0745 .  ferric citrate (AURYXIA) tablet 420 mg, 420 mg, Oral, TID WC, Gilles Chiquito B, MD, Stopped at 06/20/18 1207 .  haloperidol lactate (HALDOL) injection 2 mg, 2 mg, Intravenous, Q6H PRN, Thurnell Lose, MD, 2 mg at 06/21/18 1703 .  heparin injection 1,000 Units, 1,000 Units, Dialysis, PRN, Penninger, Lindsay, PA .  heparin injection 3,000 Units, 3,000 Units, Dialysis, PRN, Penninger, Lindsay, PA, 3,000 Units at 06/21/18 0756 .  heparin injection 5,000 Units, 5,000 Units, Subcutaneous, Q8H, Gilles Chiquito B, MD, 5,000 Units at 06/21/18 1319 .  hydrALAZINE (APRESOLINE) injection 10 mg, 10 mg, Intravenous, Q6H PRN, Thurnell Lose, MD, 10 mg at 06/20/18 1528 .  insulin aspart (novoLOG) injection 0-15 Units, 0-15 Units, Subcutaneous, Q4H, Thurnell Lose, MD, 3 Units at 06/21/18 [phone] .  insulin glargine (LANTUS) injection 8 Units, 8 Units, Subcutaneous, Daily, Thurnell Lose, MD, 8 Units at 06/21/18 [phone] .  lactated ringers infusion, , Intravenous, Continuous, Thurnell Lose, MD, Last Rate: 50 mL/hr at 06/21/18 1700 .  lidocaine (PF) (XYLOCAINE) 1 % injection 5 mL, 5 mL, Intradermal, PRN, Penninger, Ria Comment, PA .  lidocaine-prilocaine (EMLA) cream 1 application, 1 application, Topical, PRN, Dwana Melena, MD .  LORazepam (ATIVAN) 2 MG/ML injection, , , ,  .  nicardipine (CARDENE) 20mg  in 0.86% saline 224ml IV infusion (0.1 mg/ml), 3-15 mg/hr, Intravenous, Continuous, Thurnell Lose, MD, Stopped at 06/21/18 1227 .  ondansetron (ZOFRAN) injection 4 mg, 4 mg, Intravenous, Q6H PRN, Sid Falcon, MD, 4 mg at 06/20/18 1749 .  pentafluoroprop-tetrafluoroeth (GEBAUERS) aerosol 1 application, 1 application, Topical, PRN, Dwana Melena, MD .  sodium chloride flush (NS) 0.9 % injection 10-40 mL, 10-40 mL, Intracatheter, Q12H, Thurnell Lose, MD, 10 mL at 06/20/18 2223     ANALYSIS: A 16 channel recording using standard 10 20 measurements is conducted for 21 minutes.  The background activity gets as high as 6 to 7 Hz.  However, the recording is replete with 2 to 4 Hz generalized delta slowing.  There is a beta activity observed in the frontal areas.  K complexes and sleep spindles are observed.  There is not much change in the background activity with activating maneuvers.  Photic stimulation and hyperventilation are not conducted.  There are 2 episodes of sharp wave activity that phase reverses at  T3.   IMPRESSION: This recording is abnormal for the following reasons: 1.  Infrequent left temporal epileptiform discharges. 2.  Moderate global slowing indicating a moderate global encephalopathy.      Pang Robers A. Merlene Laughter, M.D.  Diplomate, Tax adviser of Psychiatry and Neurology ( Neurology).

## 2018-06-21 NOTE — Progress Notes (Addendum)
Reason for consult: Encephalopathy  Subjective: Patient transferred to ICU yesterday for  IV Cardene drip.  Her blood pressures have improved significantly however patient remains encephalopathic and very agitated.  Required multiple doses of Haldol to keep her calm.  Undergoing dialysis this morning.   EUM:PNTIRW to obtain due to poor mental status  Examination  Vital signs in last 24 hours: Temp:  [97.8 F (36.6 C)-99.3 F (37.4 C)] 97.8 F (36.6 C) (03/06 1157) Pulse Rate:  [64-190] 64 (03/06 1400) Resp:  [13-32] 22 (03/06 1400) BP: (103-215)/(48-114) 136/95 (03/06 1400) SpO2:  [91 %-100 %] 95 % (03/06 1400) Weight:  [84 kg-84.7 kg] 84 kg (03/06 1157)  General: lying in bed, agitated  CVS: pulse-normal rate and rhythm RS: breathing comfortably Extremities: normal   Neuro: MS: Agitated, tries to get up from the bed.  Speaks in few sentences intermittently.  Not following any commands CN: pupils equal and reactive, No blink to threat bilaterally face symmetric, tongue midline,  Motor: Moves all 4 extremities with good strength Coordination: normal Gait: not tested  Basic Metabolic Panel: Recent Labs  Lab 06/19/18 1620 06/19/18 2155 06/20/18 0214 06/20/18 0927 06/21/18 0611  NA 127* 132* 132*  --  135  K 4.1 3.7 3.7  --  3.8  CL 87* 91* 94*  --  100  CO2 22 18* 23  --  20*  GLUCOSE 810* 376* 111*  --  205*  BUN 43* 49* 49*  --  36*  CREATININE 9.56* 9.95* 9.71*  --  7.26*  CALCIUM 9.1 9.8 9.5  --  8.9  PHOS  --   --   --  2.5  --     CBC: Recent Labs  Lab 06/19/18 1445 06/19/18 2155 06/21/18 0611  WBC 5.1 5.5 8.9  NEUTROABS 4.3  --   --   HGB 11.3* 11.1* 9.6*  HCT 35.0* 33.8* 29.6*  MCV 95.4 92.3 95.5  PLT 135* 136* 136*     Coagulation Studies: No results for input(s): LABPROT, INR in the last 72 hours.  Imaging Reviewed:     ASSESSMENT AND PLAN  38 year old female past medical history of diabetes, trigeminal neuralgia on Tegretol, renal  insufficiency, hypertension admitted after presenting with shortness of breath after hemodialysis noted to be in DKA.  Neurology was consulted patient's worsening confusion.  On examination it appears patient did not track and appeared to have significant visual disturbance. In the setting of accelerated hypertension, high suspicion for PRES and patient transferred to ICU and started on Catapres and Nicardipine drip.  Patient remains agitated and received Haldol and Ativan yesterday.  Not following commands but occasionally speaks in sentences.  Blood pressures have improved significantly since yesterday.  Has remained afebrile, no nuchal rigidity and low suspicion for meningo-encephalitis.  Suspected PRES Hypertensive emergency Acute metabolic encephalopathy   - CT head shows no acute findings - MRI brain when stable and able to tolerate as PRES can often be missed on CT scan. -Continue aggressive blood pressure, currently at goal below 431 systolic -EEG ordered,  pending ( spoke to EEG tech to have it completed by today) - will follow read    Addendum MRI Brain : No PRES on MRI brain  EEG was performed did not show any epileptiform discharges (although official read stated left temporal sharps however discussed this read with my colleague Dr. Leonel Ramsay who also felt that there was no epileptiform discharges and sharps described in report artifactual)  Examined the patient this evening,  more calm and alert-oriented to herself, month and year, current president.  I am still concerned about her vision-if not improved by tomorrow may consider recommending ophthalmology consult. )  Impression:  hypertensive encephalopathy  Carrie [name] [facility] Pager Number [phone] For questions after 7pm please refer to AMION to reach the Neurologist on call

## 2018-06-21 NOTE — Progress Notes (Signed)
  Echocardiogram 2D Echocardiogram has been attempted. Patient receiving Hemodialysis. Will reattempt at later time.  [name] 06/21/2018, 10:37 AM

## 2018-06-21 NOTE — Progress Notes (Signed)
Asked by primary service to place orders for precedex as patient is very agitated.  She is protecting her airway and has no respiratory compromise.  Neurology service following.  Primary service comfortable managing precedex.  Will place orders and PCCM will be on standby incase of any complications then please call [phone]  Rush Farmer, M.D. [facility] Pulmonary/Critical Care Medicine. Pager: [phone]. After hours pager: [phone].

## 2018-06-21 NOTE — Progress Notes (Signed)
EEG complete - results pending 

## 2018-06-21 NOTE — Progress Notes (Signed)
[facility] Progress Note   Subjective:  Pt moved to ICU and started on nicardipine gtt w/ improved BP's. On HD now.  BP's dropped , nicardipine and UF currently off. Pt not responding to verbal address.   Objective Vitals:   06/21/18 1000 06/21/18 1015 06/21/18 1030 06/21/18 1045  BP: (!) 114/59 129/61 128/69 130/65  Pulse: 80 79 81 81  Resp: (!) 27 (!) 24 (!) 21 17  Temp:      TempSrc:      SpO2: 99% 98% 100% 97%  Weight:      Height:       Physical Exam General:NAD, lethargic , not responding verbally, not following commands Heart: RRR, no mrg Lungs: clear bilat no rales or wheezing Abdomen: soft, ND, +BS Extremities: no LE edema Dialysis Access: LU AVG cannulated   [facility]  MWF  4hr  84kg (last achieved 3/2) Hep 3000   LU AVG Calcitriol 1.75 PO TIW Aranesp 150 q2weeks (last given 2/19)  Assessment/Plan: 1. DKA vs HHS - BS>600 on arrival, well controlled at this time.  Labs improved. per primary 2. Encephalopathy - Hypertensive encephalopathy vs Meds vs DKA/HHS. BP's down on nicardipine gtt.  3. HTN emergency/ volume- BP significantly elevated. On catapres patch, IV prn's and IV nicardipine. Is at dry wt this am pre HD, see if will tolerate UF. No sig vol ^ on exam.  Keep SBP >120 on HD ( w/ htn urgency don't want to lower too far). 4. ESRD - HD today , back on schedule.  5. Anemia of CKD- Hgb 11.1. No indication for ESA at this time.  Monitor.  6. Secondary hyperparathyroidism - Ca at goal. Will check phos.  Continue VDRA and binders 7. Nutrition - Renal diet w/fluid restrictions. Protein supplements, renal vitamins 8. Gastroparesis    Rob [facility] 06/21/2018, 10:52 AM  Filed Weights   06/19/18 1203 06/21/18 0705  Weight: 84 kg 84.7 kg    Intake/Output Summary (Last 24 hours) at 06/21/2018 1052 Last data filed at 06/21/2018 1000 Gross per 24 hour  Intake 1044.9 ml  Output 3352 ml  Net -2307.1 ml    Additional  Objective Labs: Basic Metabolic Panel: Recent Labs  Lab 06/19/18 2155 06/20/18 0214 06/20/18 0927 06/21/18 0611  NA 132* 132*  --  135  K 3.7 3.7  --  3.8  CL 91* 94*  --  100  CO2 18* 23  --  20*  GLUCOSE 376* 111*  --  205*  BUN 49* 49*  --  36*  CREATININE 9.95* 9.71*  --  7.26*  CALCIUM 9.8 9.5  --  8.9  PHOS  --   --  2.5  --    Liver Function Tests: Recent Labs  Lab 06/19/18 1620  AST 43*  ALT 55*  ALKPHOS 107  BILITOT 1.2  PROT 6.7  ALBUMIN 3.8   Recent Labs  Lab 06/19/18 1620  LIPASE 25   CBC: Recent Labs  Lab 06/19/18 1445 06/19/18 2155 06/21/18 0611  WBC 5.1 5.5 8.9  NEUTROABS 4.3  --   --   HGB 11.3* 11.1* 9.6*  HCT 35.0* 33.8* 29.6*  MCV 95.4 92.3 95.5  PLT 135* 136* 136*    Cardiac Enzymes: Recent Labs  Lab 06/19/18 1620 06/19/18 2155 06/20/18 0409 06/20/18 0927  TROPONINI 0.06* 0.07* 0.11* 0.10*   CBG: Recent Labs  Lab 06/20/18 1551 06/20/18 1944 06/20/18 2337 06/21/18 0317 06/21/18 0836  GLUCAP 182* 195* 293* 304* 184*  Medications: . sodium chloride    . dexmedetomidine (PRECEDEX) IV infusion 0.4 mcg/kg/hr (06/21/18 1000)  . lactated ringers    . niCARDipine 5 mg/hr (06/21/18 1000)   . Chlorhexidine Gluconate Cloth  6 each Topical Q0600  . cloNIDine  0.2 mg Transdermal Weekly  . dextrose  2 Tube Oral STAT  . ferric citrate  420 mg Oral TID WC  . heparin  5,000 Units Subcutaneous Q8H  . insulin aspart  0-15 Units Subcutaneous Q4H  . insulin glargine  8 Units Subcutaneous Daily  . LORazepam      . LORazepam      . sodium chloride flush  10-40 mL Intracatheter Q12H    Dialysis Orders: [facility]  MWF  T4hr  180NR EDW 84kg (last achieved 3/2) Heparin 3000 units bolus   LU AVG Calcitriol 1.75 PO TIW Aranesp 150 q2weeks (last given 2/19)  Assessment/Plan: 1. DKA vs HHS - BS>600 on arrival, well controlled at this time.  Labs improved. per primary 2. Encephalopathy - Hypertensive encephalopathy vs Meds vs DKA/HHS -  combative in HD today requiring sedation d/t concerns for safety. 3. HTN emergency- BP significantly elevated. On home meds, amlodipine, labetalol.   4. Chest pain - cardiology consulting.  5. ESRD - HD today off schedule. Sedated d/t combative behavior. Plans for HD tomorrow per regular schedule. K 3.7.  6. Anemia of CKD- Hgb 11.1. No indication for ESA at this time.  Monitor.  7. Secondary hyperparathyroidism - Ca at goal. Will check phos.  Continue VDRA and binders. 8. Volume - Does not appear grossly volume overloaded.  Will continue to titrate down volume as tolerated.  Need standing weights when able.  At dry wt 9. Nutrition - Renal diet w/fluid restrictions. Protein supplements, renal vitamins 10. Gastroparesis    Rob [facility] 06/21/2018, 10:52 AM

## 2018-06-21 NOTE — Progress Notes (Signed)
RN called for pt availability for EEG.  Pt unavailable.  She is currently on dialysis, then going for a CT.  EEG techs will check later for pt availability.

## 2018-06-21 NOTE — Progress Notes (Addendum)
PROGRESS NOTE                                                                                                                                                                                                             Patient Demographics:    Carrie [name], is a 38 y.o. female, DOB - [birth year], WYO:[id number]  Admit date - 06/19/2018   Admitting Physician Sid Falcon, MD  Outpatient Primary MD for the patient is Glendon Axe, MD  LOS - 2  Chief Complaint  Patient presents with  . Migraine  . Shortness of Breath       Brief Narrative  Carrie [name] is a 38 y.o. female with medical history significant of DM1, ESRD on HD, HTN, gastroparesis.  She presents from HD today where it is reported that she developed CP and SOB after about 20 min of the session.  She was taken off of HD and then tripped and fell and hit her head.  She was then transported to the ED.  By the time I saw the patient, she had received ativan and dilaudid for a headache and she was not lucid.  She was also noted to have a very high blood sugar concerning for DKA/HHS.   Subjective:   Pt in bed undergoing HD, awake but agitated and confused, unable to answer reliably   Assessment  & Plan :     1.  DKA.  Resolved, currently on insulin sliding scale and Lantus.  Will monitor CBGs closely.  History of poor outpatient control due to hyperglycemia.  Lab Results  Component Value Date   HGBA1C 8.6 (A) 11/06/2017   CBG (last 3)  Recent Labs    06/20/18 1944 06/20/18 2337 06/21/18 0317  GLUCAP 195* 293* 304*    2.  ESRD.  Nephrology on board was dialyzed 06/20/2018 is being dialyzed again on 06/21/2018.  3.  Hypertension/hypertensive urgency possible PRES syndrome is being encephalopathy.  She has historically had a very poor outpatient blood pressure control, looking back in her chart last 3 ER visits blood pressures systolic were in 774J, patient's mother also informed me on 06/20/2018 that she took her  to PCP office few days prior to hospital visit and that blood pressure was quite elevated at that time as well.  At this time she is on Catapres patch along with IV nicardipine drip, blood pressure has stabilized encephalopathy continues.  4.  Lack of IV access.  Consulted IR, left IJ central line  in place.  5.  Chest pain.  Appears to be atypical, troponin largely unremarkable, flat and and non-ACS pattern, EKG nonacute, echo ordered to evaluate wall motion and EF.  6.  Encephalopathy.  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  CT head upon admission was stable, neurology following, repeat CT ordered by me on 06/21/2018, I think she will not be able to tolerate MRI at this time.  We will also check her EEG, requested pharmacy to switch her to IV Tegretol as she is quite agitated and I do not think she will take oral medications, continue to monitor, for now n.p.o. except medications.  7.  Diabetic gastroparesis.  PRN medications once taking oral diet.   Total Critical Care time in examining the patient bedside, evaluating Lab work and other data, over half of the total time was spent in coordinating patient care on the floor or bedisde in talking to patient/family members, communicating with nursing Staff on the floor and sub specialists Neurologist  to coordinate patients medical care and needs is  35 Minutes.   The condition which has caused critical injury/acute impairment of CNS vital organ system with a high probability of sudden clinically significant deterioration and can cause Potential Life threatening injury to this patient addressed today is hypertensive urgency/crisis requiring nicardipine drip.   Family Communication  : Mother over the phone in detail on 06/20/2018 called again 06/21/2018 at 8:00 in the morning on her listed cell phone number no response.  Code Status :  Full  Disposition Plan  :  ICU  Consults  :  Renal, IR, Neuro  Procedures  :    Echocardiogram pending   Repeat CT head ordered  06/21/2018   left IJ C Line by IR - 06/20/18  CT HEAD on 06/19/2018.  1. No acute intracranial abnormality. 2. Stable age advanced cerebral and cerebellar atrophy without significant interval progression compared to prior MRI dated 11/21/2016. CT CSPINE 1. No acute fracture or malalignment.  DVT Prophylaxis  :   Heparin    Lab Results  Component Value Date   PLT 136 (L) 06/21/2018    Diet :  Diet Order            Diet NPO time specified Except for: Sips with Meds  Diet effective now               Inpatient Medications Scheduled Meds: . Chlorhexidine Gluconate Cloth  6 each Topical Q0600  . cloNIDine  0.2 mg Transdermal Weekly  . dextrose  2 Tube Oral STAT  . ferric citrate  420 mg Oral TID WC  . heparin  5,000 Units Subcutaneous Q8H  . insulin aspart  0-15 Units Subcutaneous Q4H  . insulin glargine  8 Units Subcutaneous Daily  . LORazepam      . LORazepam      . sodium chloride flush  10-40 mL Intracatheter Q12H   Continuous Infusions: . sodium chloride    . lactated ringers    . niCARDipine 5 mg/hr (06/21/18 0700)   PRN Meds:.sodium chloride, alteplase, haloperidol lactate, heparin, heparin, hydrALAZINE, lidocaine (PF), lidocaine-prilocaine, ondansetron (ZOFRAN) IV, pentafluoroprop-tetrafluoroeth  Antibiotics  :   Anti-infectives (From admission, onward)   None          Objective:   Vitals:   06/21/18 0630 06/21/18 0700 06/21/18 0705 06/21/18 0745  BP: 131/64 119/68  129/65  Pulse: 85 81  86  Resp: 13 20  (!) 21  Temp:      TempSrc:  SpO2: 91% 95%  96%  Weight:   84.7 kg   Height:        Wt Readings from Last 3 Encounters:  06/21/18 84.7 kg  05/18/18 86.6 kg  04/09/18 87.5 kg     Intake/Output Summary (Last 24 hours) at 06/21/2018 0805 Last data filed at 06/21/2018 0700 Gross per 24 hour  Intake 934.08 ml  Output 3352 ml  Net -2417.92 ml     Physical Exam  Awake but confused and restless, supple neck, moves all 4 extremities   [place].AT,PERRAL Supple Neck,No JVD, No cervical lymphadenopathy appriciated.  Symmetrical Chest wall movement, Good air movement bilaterally, CTAB RRR,No Gallops,Rubs or new Murmurs, No Parasternal Heave +ve B.Sounds, Abd Soft, No tenderness, No organomegaly appriciated, No rebound - guarding or rigidity. No Cyanosis, Clubbing or edema, No new Rash or bruise       Data Review:    CBC Recent Labs  Lab 06/19/18 1445 06/19/18 2155 06/21/18 0611  WBC 5.1 5.5 8.9  HGB 11.3* 11.1* 9.6*  HCT 35.0* 33.8* 29.6*  PLT 135* 136* 136*  MCV 95.4 92.3 95.5  MCH 30.8 30.3 31.0  MCHC 32.3 32.8 32.4  RDW 17.5* 17.8* 18.7*  LYMPHSABS 0.5*  --   --   MONOABS 0.2  --   --   EOSABS 0.0  --   --   BASOSABS 0.1  --   --     Chemistries  Recent Labs  Lab 06/19/18 1620 06/19/18 2155 06/20/18 0214 06/21/18 0611  NA 127* 132* 132* 135  K 4.1 3.7 3.7 3.8  CL 87* 91* 94* 100  CO2 22 18* 23 20*  GLUCOSE 810* 376* 111* 205*  BUN 43* 49* 49* 36*  CREATININE 9.56* 9.95* 9.71* 7.26*  CALCIUM 9.1 9.8 9.5 8.9  AST 43*  --   --   --   ALT 55*  --   --   --   ALKPHOS 107  --   --   --   BILITOT 1.2  --   --   --    ------------------------------------------------------------------------------------------------------------------ No results for input(s): CHOL, HDL, LDLCALC, TRIG, CHOLHDL, LDLDIRECT in the last 72 hours.  Lab Results  Component Value Date   HGBA1C 8.6 (A) 11/06/2017   ------------------------------------------------------------------------------------------------------------------ No results for input(s): TSH, T4TOTAL, T3FREE, THYROIDAB in the last 72 hours.  Invalid input(s): FREET3 ------------------------------------------------------------------------------------------------------------------ No results for input(s): VITAMINB12, FOLATE, FERRITIN, TIBC, IRON, RETICCTPCT in the last 72 hours.  Coagulation profile No results for input(s): INR, PROTIME in the last 168  hours.  No results for input(s): DDIMER in the last 72 hours.  Cardiac Enzymes Recent Labs  Lab 06/19/18 2155 06/20/18 0409 06/20/18 0927  TROPONINI 0.07* 0.11* 0.10*   ------------------------------------------------------------------------------------------------------------------ No results found for: BNP  Micro Results Recent Results (from the past 240 hour(s))  MRSA PCR Screening     Status: None   Collection Time: 06/20/18  6:05 AM  Result Value Ref Range Status   MRSA by PCR NEGATIVE NEGATIVE Final    Comment:        The GeneXpert MRSA Assay (FDA approved for NASAL specimens only), is one component of a comprehensive MRSA colonization surveillance program. It is not intended to diagnose MRSA infection nor to guide or monitor treatment for MRSA infections. Performed at [facility], [place] [address], [place], [place] [address]     Radiology Reports Dg Chest 1 View  Result Date: 06/19/2018 CLINICAL DATA:  Patient fell on weighted dialysis.  Dyspnea. EXAM: CHEST  1 VIEW COMPARISON:  05/28/2018 FINDINGS: Stable cardiomegaly. Nonaneurysmal thoracic aorta. Mild vascular congestion with low lung volumes. No pulmonary contusion, effusion or pneumothorax. No acute displaced rib fractures. IMPRESSION: Cardiomegaly with low lung volumes.  Mild vascular congestion. Electronically Signed   By: Ashley Royalty M.D.   On: 06/19/2018 18:27   Ct Head Wo Contrast  Result Date: 06/19/2018 CLINICAL DATA:  38 year old female with headache and knee pain after slipping and falling on her way and dialysis earlier today. EXAM: CT HEAD WITHOUT CONTRAST CT CERVICAL SPINE WITHOUT CONTRAST TECHNIQUE: Multidetector CT imaging of the head and cervical spine was performed following the standard protocol without intravenous contrast. Multiplanar CT image reconstructions of the cervical spine were also generated. COMPARISON:  Prior brain MRI 11/21/2016; prior CT scan of the head 11/14/2013 FINDINGS: CT  HEAD FINDINGS Brain: No evidence of acute infarction, hemorrhage, hydrocephalus, extra-axial collection or mass lesion/mass effect. Age advanced cerebral and cerebellar cortical atrophy. The cerebellar atrophy is predominantly midline and inferior with mild hypoplasia of the vermis. 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 These findings are unchanged compared to prior imaging from August of 2018. Vascular: No hyperdense vessel or unexpected calcification. Skull: Normal. Negative for fracture or focal lesion. Sinuses/Orbits: Mucous retention cyst present within the left sphenoid air cell. Other: None. CT CERVICAL SPINE FINDINGS Alignment: Normal. Skull base and vertebrae: No acute fracture. No primary bone lesion or focal pathologic process. Soft tissues and spinal canal: No prevertebral fluid or swelling. No visible canal hematoma. Disc levels:  No level specific disease. Upper chest: Negative. Other: None IMPRESSION: CT HEAD 1. No acute intracranial abnormality. 2. Stable age advanced cerebral and cerebellar atrophy without significant interval progression compared to prior MRI dated 11/21/2016. CT CSPINE 1. No acute fracture or malalignment. Electronically Signed   By: Jacqulynn Cadet M.D.   On: 06/19/2018 16:03   Ct Cervical Spine Wo Contrast  Result Date: 06/19/2018 CLINICAL DATA:  37 year old female with headache and knee pain after slipping and falling on her way and dialysis earlier today. EXAM: CT HEAD WITHOUT CONTRAST CT CERVICAL SPINE WITHOUT CONTRAST TECHNIQUE: Multidetector CT imaging of the head and cervical spine was performed following the standard protocol without intravenous contrast. Multiplanar CT image reconstructions of the cervical spine were also generated. COMPARISON:  Prior brain MRI 11/21/2016; prior CT scan of the head 11/14/2013 FINDINGS: CT HEAD FINDINGS Brain: No evidence of acute infarction, hemorrhage, hydrocephalus, extra-axial collection or mass lesion/mass  effect. Age advanced cerebral and cerebellar cortical atrophy. The cerebellar atrophy is predominantly midline and inferior with mild hypoplasia of the vermis. 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 These findings are unchanged compared to prior imaging from August of 2018. Vascular: No hyperdense vessel or unexpected calcification. Skull: Normal. Negative for fracture or focal lesion. Sinuses/Orbits: Mucous retention cyst present within the left sphenoid air cell. Other: None. CT CERVICAL SPINE FINDINGS Alignment: Normal. Skull base and vertebrae: No acute fracture. No primary bone lesion or focal pathologic process. Soft tissues and spinal canal: No prevertebral fluid or swelling. No visible canal hematoma. Disc levels:  No level specific disease. Upper chest: Negative. Other: None IMPRESSION: CT HEAD 1. No acute intracranial abnormality. 2. Stable age advanced cerebral and cerebellar atrophy without significant interval progression compared to prior MRI dated 11/21/2016. CT CSPINE 1. No acute fracture or malalignment. Electronically Signed   By: Jacqulynn Cadet M.D.   On: 06/19/2018 16:03   Ir Fluoro Guide Cv Line Left  Result Date: 06/20/2018 INDICATION: End-stage renal disease.  For IV access. EXAM: TUNNELED LEFT INTERNAL JUGULAR PICC LINE PLACEMENT WITH ULTRASOUND AND FLUOROSCOPIC GUIDANCE MEDICATIONS: None ANESTHESIA/SEDATION: None FLUOROSCOPY TIME:  Fluoroscopy Time:  minutes 6 seconds (0.4 mGy). COMPLICATIONS: None immediate. PROCEDURE: The patient's mother was advised of the possible risks and complications and agreed to undergo the procedure. The patient was then brought to the angiographic suite for the procedure. The left neck was prepped with chlorhexidine, draped in the usual sterile fashion using maximum barrier technique (cap and mask, sterile gown, sterile gloves, large sterile sheet, hand hygiene and cutaneous antiseptic). Local anesthesia was attained by infiltration with 1%  lidocaine. Ultrasound demonstrated patency of the left jugular vein, and this was documented with an image. Under real-time ultrasound guidance, this vein was accessed with a 21 gauge micropuncture needle and image documentation was performed. The needle was exchanged over a guidewire for a peel-away sheath through which a 27 cm 5 [place] double lumen tunneled power injectable PICC was advanced, and positioned with its tip at the lower SVC/right atrial junction. The cuff was positioned in the subcutaneous tract. Fluoroscopy during the procedure and fluoro spot radiograph confirms appropriate catheter position. The catheter was flushed, secured to the skin with Prolene sutures, and covered with a sterile dressing. IMPRESSION: Successful placement of a tunneled left internal jugular PICC with sonographic and fluoroscopic guidance. The catheter is ready for use. Electronically Signed   By: Marybelle Killings M.D.   On: 06/20/2018 15:23   Ir US Guide Vasc Access Left  Result Date: 06/20/2018 INDICATION: End-stage renal disease.  For IV access. EXAM: TUNNELED LEFT INTERNAL JUGULAR PICC LINE PLACEMENT WITH ULTRASOUND AND FLUOROSCOPIC GUIDANCE MEDICATIONS: None ANESTHESIA/SEDATION: None FLUOROSCOPY TIME:  Fluoroscopy Time:  minutes 6 seconds (0.4 mGy). COMPLICATIONS: None immediate. PROCEDURE: The patient's mother was advised of the possible risks and complications and agreed to undergo the procedure. The patient was then brought to the angiographic suite for the procedure. The left neck was prepped with chlorhexidine, draped in the usual sterile fashion using maximum barrier technique (cap and mask, sterile gown, sterile gloves, large sterile sheet, hand hygiene and cutaneous antiseptic). Local anesthesia was attained by infiltration with 1% lidocaine. Ultrasound demonstrated patency of the left jugular vein, and this was documented with an image. Under real-time ultrasound guidance, this vein was accessed with a 21 gauge  micropuncture needle and image documentation was performed. The needle was exchanged over a guidewire for a peel-away sheath through which a 27 cm 5 [place] double lumen tunneled power injectable PICC was advanced, and positioned with its tip at the lower SVC/right atrial junction. The cuff was positioned in the subcutaneous tract. Fluoroscopy during the procedure and fluoro spot radiograph confirms appropriate catheter position. The catheter was flushed, secured to the skin with Prolene sutures, and covered with a sterile dressing. IMPRESSION: Successful placement of a tunneled left internal jugular PICC with sonographic and fluoroscopic guidance. The catheter is ready for use. Electronically Signed   By: Marybelle Killings M.D.   On: 06/20/2018 15:23   Dg Chest Portable 1 View  Result Date: 05/28/2018 CLINICAL DATA:  Flu-like symptoms EXAM: PORTABLE CHEST 1 VIEW COMPARISON:  05/18/2018 FINDINGS: Cardiac shadow is enlarged but stable. The lungs are well aerated bilaterally. No focal infiltrate or sizable effusion is seen. Minimal scarring is noted in the left mid lung stable from the prior exam. IMPRESSION: Stable cardiomegaly.  No acute abnormality noted. Electronically Signed   By: Linus Mako.D.  On: 05/28/2018 00:26   Dg Knee Complete 4 Views Right  Result Date: 06/19/2018 CLINICAL DATA:  Right knee pain after fall while going to dialysis. EXAM: RIGHT KNEE - COMPLETE 4+ VIEW COMPARISON:  None. FINDINGS: No evidence of fracture, dislocation, or joint effusion. No evidence of arthropathy or other focal bone abnormality. Soft tissues are unremarkable. IMPRESSION: Negative. Electronically Signed   By: Ashley Royalty M.D.   On: 06/19/2018 18:30    Time Spent in minutes  30   Lala Lund M.D on 06/21/2018 at 8:05 AM  To page go to [email or web address] - password [email or web address]

## 2018-06-22 DIAGNOSIS — G9341 Metabolic encephalopathy: Secondary | ICD-10-CM

## 2018-06-22 LAB — BASIC METABOLIC PANEL
Anion gap: 10 (ref 5–15)
BUN: 22 mg/dL — ABNORMAL HIGH (ref 6–20)
CALCIUM: 8.8 mg/dL — AB (ref 8.9–10.3)
CO2: 25 mmol/L (ref 22–32)
Chloride: 102 mmol/L (ref 98–111)
Creatinine, Ser: 5.49 mg/dL — ABNORMAL HIGH (ref 0.44–1.00)
GFR calc Af Amer: 11 mL/min — ABNORMAL LOW (ref >60)
GFR calc non Af Amer: 9 mL/min — ABNORMAL LOW (ref >60)
Glucose, Bld: 133 mg/dL — ABNORMAL HIGH (ref 70–99)
Potassium: 3.8 mmol/L (ref 3.5–5.1)
Sodium: 137 mmol/L (ref 135–145)

## 2018-06-22 LAB — CBC
HCT: 33.1 % — ABNORMAL LOW (ref 36.0–46.0)
Hemoglobin: 10.5 g/dL — ABNORMAL LOW (ref 12.0–15.0)
MCH: 30.1 pg (ref 26.0–34.0)
MCHC: 31.7 g/dL (ref 30.0–36.0)
MCV: 94.8 fL (ref 80.0–100.0)
PLATELETS: 161 10*3/uL (ref 150–400)
RBC: 3.49 MIL/uL — ABNORMAL LOW (ref 3.87–5.11)
RDW: 18.1 % — ABNORMAL HIGH (ref 11.5–15.5)
WBC: 5.2 10*3/uL (ref 4.0–10.5)
nRBC: 0 % (ref 0.0–0.2)

## 2018-06-22 LAB — GLUCOSE, CAPILLARY
GLUCOSE-CAPILLARY: 126 mg/dL — AB (ref 70–99)
Glucose-Capillary: 116 mg/dL — ABNORMAL HIGH (ref 70–99)
Glucose-Capillary: 132 mg/dL — ABNORMAL HIGH (ref 70–99)
Glucose-Capillary: 150 mg/dL — ABNORMAL HIGH (ref 70–99)
Glucose-Capillary: 191 mg/dL — ABNORMAL HIGH (ref 70–99)
Glucose-Capillary: 206 mg/dL — ABNORMAL HIGH (ref 70–99)
Glucose-Capillary: 245 mg/dL — ABNORMAL HIGH (ref 70–99)

## 2018-06-22 MED ORDER — CARBAMAZEPINE 100 MG/5ML PO SUSP
200.0000 mg | Freq: Every day | ORAL | Status: DC
  Administered 2018-06-23 – 2018-06-24 (×2): 200 mg via ORAL
  Filled 2018-06-22 (×2): qty 10

## 2018-06-22 MED ORDER — CARBAMAZEPINE 100 MG/5ML PO SUSP
200.0000 mg | Freq: Every day | ORAL | Status: DC
  Filled 2018-06-22: qty 10

## 2018-06-22 MED ORDER — NICARDIPINE HCL IN NACL 40-0.83 MG/200ML-% IV SOLN
3.0000 mg/h | INTRAVENOUS | Status: DC
  Administered 2018-06-22: 5 mg/h via INTRAVENOUS
  Administered 2018-06-22: 3 mg/h via INTRAVENOUS
  Administered 2018-06-23: 6 mg/h via INTRAVENOUS
  Administered 2018-06-23: 9 mg/h via INTRAVENOUS
  Filled 2018-06-22 (×5): qty 200

## 2018-06-22 MED ORDER — INSULIN ASPART 100 UNIT/ML ~~LOC~~ SOLN
0.0000 [IU] | Freq: Every day | SUBCUTANEOUS | Status: DC
  Administered 2018-06-22: 2 [IU] via SUBCUTANEOUS

## 2018-06-22 MED ORDER — TRAZODONE HCL 50 MG PO TABS
50.0000 mg | ORAL_TABLET | Freq: Once | ORAL | Status: AC
  Administered 2018-06-22: 50 mg via ORAL
  Filled 2018-06-22: qty 1

## 2018-06-22 MED ORDER — DIPHENHYDRAMINE HCL 25 MG PO CAPS
25.0000 mg | ORAL_CAPSULE | Freq: Every evening | ORAL | Status: DC | PRN
  Administered 2018-06-22 – 2018-06-23 (×2): 25 mg via ORAL
  Filled 2018-06-22 (×2): qty 1

## 2018-06-22 MED ORDER — ACETAMINOPHEN 325 MG PO TABS: 650.0000 mg | ORAL_TABLET | Freq: Three times a day (TID) | ORAL | Status: DC | PRN

## 2018-06-22 MED ORDER — INSULIN ASPART 100 UNIT/ML ~~LOC~~ SOLN
0.0000 [IU] | Freq: Three times a day (TID) | SUBCUTANEOUS | Status: DC
  Administered 2018-06-23: 8 [IU] via SUBCUTANEOUS
  Administered 2018-06-23 (×2): 3 [IU] via SUBCUTANEOUS
  Administered 2018-06-24: 2 [IU] via SUBCUTANEOUS
  Administered 2018-06-24: 5 [IU] via SUBCUTANEOUS

## 2018-06-22 NOTE — Plan of Care (Signed)
  Problem: Clinical Measurements: Goal: Respiratory complications will improve Outcome: Progressing Goal: Cardiovascular complication will be avoided Outcome: Progressing   

## 2018-06-22 NOTE — Progress Notes (Signed)
[facility] Progress Note   Subjective:  Fully alert and awake this am. No c/o's  Objective Vitals:   06/22/18 1100 06/22/18 1200 06/22/18 1300 06/22/18 1400  BP: (!) 154/88 (!) 164/76 (!) 171/80 (!) 178/92  Pulse: 77     Resp: 19 18 (!) 22 (!) 26  Temp:  97.9 F (36.6 C)    TempSrc:  Oral    SpO2: 100%     Weight:      Height:       Physical Exam General:NAD, awake, Ox 3 Heart: RRR, no mrg Lungs: clear bilat no rales or wheezing Abdomen: soft, ND, +BS Extremities: no LE edema Dialysis Access: LU AVG cannulated   [facility]  MWF  4hr  84kg (last achieved 3/2) Hep 3000   LU AVG Calcitriol 1.75 PO TIW Aranesp 150 q2weeks (last given 2/19)  Assessment/Plan: 1. DKA vs HHS - BS>600 on arrival, well controlled at this time.  Labs improved. per primary 2. Encephalopathy - Hypertensive encephalopathy vs DKA/HHS. Resolved, back to baseline.  3. HTN emergency/ volume- sp nicardipine gtt. Better now on po meds.  4. ESRD - HD MWF. HD Monday.  5. Anemia of CKD- Hgb 11.1. No indication for ESA at this time.  Monitor.  6. Secondary hyperparathyroidism - Ca at goal. Will check phos.  Continue VDRA and binders 7. Nutrition - Renal diet w/fluid restrictions. Protein supplements, renal vitamins    Rob [facility] 06/21/2018, 10:52 AM  Filed Weights   06/19/18 1203 06/21/18 0705 06/21/18 1157  Weight: 84 kg 84.7 kg 84 kg    Intake/Output Summary (Last 24 hours) at 06/22/2018 1510 Last data filed at 06/22/2018 1300 Gross per 24 hour  Intake 1277.75 ml  Output -  Net 1277.75 ml    Additional Objective Labs: Basic Metabolic Panel: Recent Labs  Lab 06/20/18 0214 06/20/18 0927 06/21/18 0611 06/22/18 0414  NA 132*  --  135 137  K 3.7  --  3.8 3.8  CL 94*  --  100 102  CO2 23  --  20* 25  GLUCOSE 111*  --  205* 133*  BUN 49*  --  36* 22*  CREATININE 9.71*  --  7.26* 5.49*  CALCIUM 9.5  --  8.9 8.8*  PHOS  --  2.5  --   --    Liver Function  Tests: Recent Labs  Lab 06/19/18 1620  AST 43*  ALT 55*  ALKPHOS 107  BILITOT 1.2  PROT 6.7  ALBUMIN 3.8   Recent Labs  Lab 06/19/18 1620  LIPASE 25   CBC: Recent Labs  Lab 06/19/18 1445 06/19/18 2155 06/21/18 0611 06/22/18 0414  WBC 5.1 5.5 8.9 5.2  NEUTROABS 4.3  --   --   --   HGB 11.3* 11.1* 9.6* 10.5*  HCT 35.0* 33.8* 29.6* 33.1*  MCV 95.4 92.3 95.5 94.8  PLT 135* 136* 136* 161    Cardiac Enzymes: Recent Labs  Lab 06/19/18 1620 06/19/18 2155 06/20/18 0409 06/20/18 0927  TROPONINI 0.06* 0.07* 0.11* 0.10*   CBG: Recent Labs  Lab 06/21/18 1940 06/22/18 0043 06/22/18 0342 06/22/18 0812 06/22/18 1209  GLUCAP 219* 132* 116* 191* 150*    Medications: . sodium chloride    . dexmedetomidine (PRECEDEX) IV infusion Stopped (06/22/18 1012)  . niCARDipine 3 mg/hr (06/22/18 1446)   . carBAMazepine  200 mg Per Tube Q1200  . Chlorhexidine Gluconate Cloth  6 each Topical Q0600  . cloNIDine  0.2 mg Transdermal Weekly  .  ferric citrate  420 mg Oral TID WC  . heparin  5,000 Units Subcutaneous Q8H  . insulin aspart  0-15 Units Subcutaneous Q4H  . insulin glargine  8 Units Subcutaneous Daily  . sodium chloride flush  10-40 mL Intracatheter Q12H    Dialysis Orders: [facility]  MWF  T4hr  180NR EDW 84kg (last achieved 3/2) Heparin 3000 units bolus   LU AVG Calcitriol 1.75 PO TIW Aranesp 150 q2weeks (last given 2/19)  Assessment/Plan: 1. DKA vs HHS - BS>600 on arrival, well controlled at this time.  Labs improved. per primary 2. Encephalopathy - Hypertensive encephalopathy vs Meds vs DKA/HHS - combative in HD today requiring sedation d/t concerns for safety. 3. HTN emergency- BP significantly elevated. On home meds, amlodipine, labetalol.   4. Chest pain - cardiology consulting.  5. ESRD - HD today off schedule. Sedated d/t combative behavior. Plans for HD tomorrow per regular schedule. K 3.7.  6. Anemia of CKD- Hgb 11.1. No indication for ESA at this time.   Monitor.  7. Secondary hyperparathyroidism - Ca at goal. Will check phos.  Continue VDRA and binders. 8. Volume - Does not appear grossly volume overloaded.  Will continue to titrate down volume as tolerated.  Need standing weights when able.  At dry wt 9. Nutrition - Renal diet w/fluid restrictions. Protein supplements, renal vitamins 10. Gastroparesis    Rob [facility] 06/22/2018, 3:10 PM

## 2018-06-22 NOTE — Progress Notes (Signed)
PROGRESS NOTE    Carrie [name]  ZOX:[id number] DOB: 11-20-1980 DOA: 06/19/2018 PCP: Glendon Axe, MD  Outpatient Specialists:   Brief Narrative: Carrie [name] a 38 y.o.femalewith past medical history significant forDM1, ESRD on HD, HTN, gastroparesis. Patient presented from HD where it was reported that patient developed CP and SOB after about 20 min of Hemodialysis session.  Patient was found to have significantly elevated blood pressure, as well as, elevated blood sugar.  Due to concerns for possible PRES, patient was transferred to the ICU team.  Patient has been on Cardene drip.  Blood pressure seems to be improving.  Neurology input is highly appreciated.  discussed with patient's mother and stepfather extensively.  Altered mentation continues to improve.  MRI brain did not reveal any acute process, and did not show characteristic findings of PRES.  Assessment & Plan:   Active Problems:   DKA (diabetic ketoacidoses) (HCC)   Gastroparesis   ESRD on hemodialysis (HCC)   Chest pain   Nausea & vomiting   HHS (hypothenar hammer syndrome) (HCC)   Acute encephalopathy likely metabolic: -Initial concerns for PRES, get MRI of the brain was nonrevealing. -DKA has resolved -Hypertensive urgency is been treated -Altered mentation is improving -Further management depend on hospital course.  DKA: Resolved. Continue to optimize blood sugar management.  Hypertensive urgency: Continue Cardene drip for now. Eventually transitioned to oral medication.  ESRD on hemodialysis: Patient was dialyzed daily last 2 days. No plans for dialysis today. No indication for emergent hemodialysis.  Diabetic gastroparesis: Manage expectantly.   DVT prophylaxis: Subcu to heparin Code Status: Full code Family Communication: Mother and stepfather Disposition Plan: This will depend on hospital course.   Consultants:   Neurology  Pulmonary/critical care team for Precedex  Procedures:    None  Antimicrobials:   None   Subjective: No new complaints Encephalopathy is improving BP control is improving on Cardene  Objective: Vitals:   06/22/18 1100 06/22/18 1200 06/22/18 1300 06/22/18 1400  BP: (!) 154/88 (!) 164/76 (!) 171/80 (!) 178/92  Pulse: 77     Resp: 19 18 (!) 22 (!) 26  Temp:  97.9 F (36.6 C)    TempSrc:  Oral    SpO2: 100%     Weight:      Height:        Intake/Output Summary (Last 24 hours) at 06/22/2018 1649 Last data filed at 06/22/2018 1511 Gross per 24 hour  Intake 1312.84 ml  Output -  Net 1312.84 ml   Filed Weights   06/19/18 1203 06/21/18 0705 06/21/18 1157  Weight: 84 kg 84.7 kg 84 kg    Examination:  General exam: Appears calm and comfortable.  Chronically ill looking. Respiratory system: Clear to auscultation.  Cardiovascular system: S1 & S2 heard. No pedal edema. Gastrointestinal system: Abdomen is nondistended, soft and nontender. No organomegaly or masses felt. Normal bowel sounds heard. Central nervous system: Alert and oriented.  Patient moves all limbs. Extremities: No leg edema.  Data Reviewed: I have personally reviewed following labs and imaging studies  CBC: Recent Labs  Lab 06/19/18 1445 06/19/18 2155 06/21/18 0611 06/22/18 0414  WBC 5.1 5.5 8.9 5.2  NEUTROABS 4.3  --   --   --   HGB 11.3* 11.1* 9.6* 10.5*  HCT 35.0* 33.8* 29.6* 33.1*  MCV 95.4 92.3 95.5 94.8  PLT 135* 136* 136* 811   Basic Metabolic Panel: Recent Labs  Lab 06/19/18 1620 06/19/18 2155 06/20/18 0214 06/20/18 9147 06/21/18 8295 06/22/18 6213  NA 127* 132* 132*  --  135 137  K 4.1 3.7 3.7  --  3.8 3.8  CL 87* 91* 94*  --  100 102  CO2 22 18* 23  --  20* 25  GLUCOSE 810* 376* 111*  --  205* 133*  BUN 43* 49* 49*  --  36* 22*  CREATININE 9.56* 9.95* 9.71*  --  7.26* 5.49*  CALCIUM 9.1 9.8 9.5  --  8.9 8.8*  PHOS  --   --   --  2.5  --   --    GFR: Estimated Creatinine Clearance: 16.2 mL/min (A) (by C-G formula based on SCr of  5.49 mg/dL (H)). Liver Function Tests: Recent Labs  Lab 06/19/18 1620  AST 43*  ALT 55*  ALKPHOS 107  BILITOT 1.2  PROT 6.7  ALBUMIN 3.8   Recent Labs  Lab 06/19/18 1620  LIPASE 25   No results for input(s): AMMONIA in the last 168 hours. Coagulation Profile: No results for input(s): INR, PROTIME in the last 168 hours. Cardiac Enzymes: Recent Labs  Lab 06/19/18 1620 06/19/18 2155 06/20/18 0409 06/20/18 0927  TROPONINI 0.06* 0.07* 0.11* 0.10*   BNP (last 3 results) No results for input(s): PROBNP in the last 8760 hours. HbA1C: Recent Labs    06/21/18 0808  HGBA1C 9.7*   CBG: Recent Labs  Lab 06/22/18 0043 06/22/18 0342 06/22/18 0812 06/22/18 1209 06/22/18 1519  GLUCAP 132* 116* 191* 150* 126*   Lipid Profile: No results for input(s): CHOL, HDL, LDLCALC, TRIG, CHOLHDL, LDLDIRECT in the last 72 hours. Thyroid Function Tests: No results for input(s): TSH, T4TOTAL, FREET4, T3FREE, THYROIDAB in the last 72 hours. Anemia Panel: No results for input(s): VITAMINB12, FOLATE, FERRITIN, TIBC, IRON, RETICCTPCT in the last 72 hours. Urine analysis:    Component Value Date/Time   COLORURINE YELLOW 10/08/2017 1700   APPEARANCEUR HAZY (A) 10/08/2017 1700   LABSPEC 1.009 10/08/2017 1700   PHURINE 8.0 10/08/2017 1700   GLUCOSEU >=500 (A) 10/08/2017 1700   HGBUR SMALL (A) 10/08/2017 1700   HGBUR large 05/25/2009 0900   BILIRUBINUR NEGATIVE 10/08/2017 1700   BILIRUBINUR neg 10/23/2012 1810   KETONESUR NEGATIVE 10/08/2017 1700   PROTEINUR >=300 (A) 10/08/2017 1700   UROBILINOGEN 0.2 04/16/2013 2155   NITRITE NEGATIVE 10/08/2017 1700   LEUKOCYTESUR LARGE (A) 10/08/2017 1700   Sepsis Labs: @LABRCNTIP (procalcitonin:4,lacticidven:4)  ) Recent Results (from the past 240 hour(s))  MRSA PCR Screening     Status: None   Collection Time: 06/20/18  6:05 AM  Result Value Ref Range Status   MRSA by PCR NEGATIVE NEGATIVE Final    Comment:        The GeneXpert MRSA Assay  (FDA approved for NASAL specimens only), is one component of a comprehensive MRSA colonization surveillance program. It is not intended to diagnose MRSA infection nor to guide or monitor treatment for MRSA infections. Performed at [facility], [place] [address], [place], [place] [address]          Radiology Studies: Ct Head Wo Contrast  Result Date: 06/21/2018 CLINICAL DATA:  38 y/o F; altered mental status while at hemodialysis. EXAM: CT HEAD WITHOUT CONTRAST TECHNIQUE: Contiguous axial images were obtained from the base of the skull through the vertex without intravenous contrast. COMPARISON:  06/19/2018 CT head. FINDINGS: Brain: No evidence of acute infarction, hemorrhage, hydrocephalus, extra-axial collection or mass lesion/mass effect. Stable age advanced volume loss of the brain. Vascular: No hyperdense vessel or unexpected calcification. Skull: Normal.  Negative for fracture or focal lesion. Sinuses/Orbits: Mucous retention cysts are present within the right maxillary sinus and the sphenoid sinus. Additional visible paranasal sinuses and the mastoid air cells are normally aerated. Orbits are unremarkable. Right intra-ocular lens replacement. Other: None. IMPRESSION: 1. No acute intracranial abnormality identified. 2. Stable age advanced volume loss of the brain. Electronically Signed   By: Kristine Garbe M.D.   On: 06/21/2018 14:26   Mr Brain Wo Contrast  Result Date: 06/21/2018 CLINICAL DATA:  38 y/o  F; encephalopathy. EXAM: MRI HEAD WITHOUT CONTRAST TECHNIQUE: Multiplanar, multiecho pulse sequences of the brain and surrounding structures were obtained without intravenous contrast. COMPARISON:  06/21/2018 CT head FINDINGS: Brain: No acute infarction, hemorrhage, hydrocephalus, extra-axial collection or mass lesion. Age advanced diffuse volume loss of the brain. No additional structural or signal abnormality is evident. Vascular: Normal flow voids. Skull and upper cervical  spine: Normal marrow signal. Sinuses/Orbits: Small mucous retention cysts within the sphenoid sinus and the right maxillary sinus. No abnormal signal of mastoid air cells. Right intra-ocular lens replacement. Other: None. IMPRESSION: 1. No acute intracranial abnormality identified. 2. Stable diffuse age advanced volume loss of the brain. Electronically Signed   By: Kristine Garbe M.D.   On: 06/21/2018 18:17        Scheduled Meds: . [START ON 06/23/2018] carBAMazepine  200 mg Oral Q1200  . Chlorhexidine Gluconate Cloth  6 each Topical Q0600  . cloNIDine  0.2 mg Transdermal Weekly  . ferric citrate  420 mg Oral TID WC  . heparin  5,000 Units Subcutaneous Q8H  . insulin aspart  0-15 Units Subcutaneous Q4H  . insulin glargine  8 Units Subcutaneous Daily  . sodium chloride flush  10-40 mL Intracatheter Q12H   Continuous Infusions: . sodium chloride    . dexmedetomidine (PRECEDEX) IV infusion 0.4 mcg/kg/hr (06/22/18 1630)  . niCARDipine 3 mg/hr (06/22/18 1446)     LOS: 3 days    Time spent: 85 Minutes    Dana Allan, MD  [facility] Pager #: [phone] 7PM-7AM contact night coverage as above

## 2018-06-22 NOTE — Progress Notes (Signed)
Reason for consult:  Encephalopathy   Subjective: Patient is significantly improved from yesterday.  he is off Precedex is alert and oriented x4.  She no longer appears to have any visual field deficits and is tracking.   ROS: negative except above  Examination  Vital signs in last 24 hours: Temp:  [97.2 F (36.2 C)-97.9 F (36.6 C)] 97.5 F (36.4 C) (03/07 0800) Pulse Rate:  [62-75] 72 (03/07 1000) Resp:  [13-22] 15 (03/07 1000) BP: (120-160)/(59-95) 136/77 (03/07 1000) SpO2:  [95 %-100 %] 100 % (03/07 1000) Weight:  [84 kg] 84 kg (03/06 1157)  General: lying in bed CVS: pulse-normal rate and rhythm RS: breathing comfortably Extremities: normal   Neuro: MS: Alert, oriented, follows commands CN: pupils equal and reactive,  EOMI, Visual fields intact in all 4 quadrants,  face symmetric, tongue midline, normal sensation over face, Motor: 5/5 strength in all 4 extremities Reflexes: 2+ bilaterally over patella, biceps, plantars: flexor Coordination: normal Gait: not tested  Basic Metabolic Panel: Recent Labs  Lab 06/19/18 1620 06/19/18 2155 06/20/18 0214 06/20/18 0927 06/21/18 0611 06/22/18 0414  NA 127* 132* 132*  --  135 137  K 4.1 3.7 3.7  --  3.8 3.8  CL 87* 91* 94*  --  100 102  CO2 22 18* 23  --  20* 25  GLUCOSE 810* 376* 111*  --  205* 133*  BUN 43* 49* 49*  --  36* 22*  CREATININE 9.56* 9.95* 9.71*  --  7.26* 5.49*  CALCIUM 9.1 9.8 9.5  --  8.9 8.8*  PHOS  --   --   --  2.5  --   --     CBC: Recent Labs  Lab 06/19/18 1445 06/19/18 2155 06/21/18 0611 06/22/18 0414  WBC 5.1 5.5 8.9 5.2  NEUTROABS 4.3  --   --   --   HGB 11.3* 11.1* 9.6* 10.5*  HCT 35.0* 33.8* 29.6* 33.1*  MCV 95.4 92.3 95.5 94.8  PLT 135* 136* 136* 161     Coagulation Studies: No results for input(s): LABPROT, INR in the last 72 hours.  Imaging Reviewed:     ASSESSMENT AND PLAN  38 year old female past medical history of diabetes, trigeminal neuralgia on Tegretol, renal  insufficiency, hypertension admitted after presenting with shortness of breath after hemodialysis noted to be in DKA.  Neurology was consulted patient's worsening confusion.  On examination it appears patient did not track and appeared to have significant visual disturbance. In the setting of accelerated hypertension, high suspicion for PRES and patient transferred to ICU and started on Catapres and Nicardipine drip.  Patient underwent MRI brain which showed no evidence of PRES, however this was performed around 24 hours after blood pressure control and possibly was reversed.  As of yesterday patient did appear to have visual field deficits, no longer present on examination today.  She is alert and oriented. EEG performed yesterday showed no epileptiform discharges per my read (official report does mention left temporal sharps which I think are artifactual).    Hypertensive encephalopathy/Likely PRES that has resolved   Recommendations BP goal less than 140/90 mmHg  Thanks for the consult.  Please call back if any questions.  Karena Addison  [facility] Pager Number [phone] For questions after 7pm please refer to AMION to reach the Neurologist on call

## 2018-06-23 LAB — BASIC METABOLIC PANEL
Anion gap: 16 — ABNORMAL HIGH (ref 5–15)
BUN: 34 mg/dL — ABNORMAL HIGH (ref 6–20)
CO2: 20 mmol/L — ABNORMAL LOW (ref 22–32)
Calcium: 8.9 mg/dL (ref 8.9–10.3)
Chloride: 98 mmol/L (ref 98–111)
Creatinine, Ser: 7.4 mg/dL — ABNORMAL HIGH (ref 0.44–1.00)
GFR calc non Af Amer: 6 mL/min — ABNORMAL LOW (ref >60)
GFR, EST AFRICAN AMERICAN: 8 mL/min — AB (ref >60)
Glucose, Bld: 157 mg/dL — ABNORMAL HIGH (ref 70–99)
Potassium: 3.5 mmol/L (ref 3.5–5.1)
Sodium: 134 mmol/L — ABNORMAL LOW (ref 135–145)

## 2018-06-23 LAB — CBC
HCT: 33.6 % — ABNORMAL LOW (ref 36.0–46.0)
Hemoglobin: 10.7 g/dL — ABNORMAL LOW (ref 12.0–15.0)
MCH: 30.7 pg (ref 26.0–34.0)
MCHC: 31.8 g/dL (ref 30.0–36.0)
MCV: 96.6 fL (ref 80.0–100.0)
NRBC: 0 % (ref 0.0–0.2)
Platelets: 217 10*3/uL (ref 150–400)
RBC: 3.48 MIL/uL — ABNORMAL LOW (ref 3.87–5.11)
RDW: 18.3 % — ABNORMAL HIGH (ref 11.5–15.5)
WBC: 6.9 10*3/uL (ref 4.0–10.5)

## 2018-06-23 LAB — GLUCOSE, CAPILLARY
GLUCOSE-CAPILLARY: 182 mg/dL — AB (ref 70–99)
Glucose-Capillary: 251 mg/dL — ABNORMAL HIGH (ref 70–99)
Glucose-Capillary: 167 mg/dL — ABNORMAL HIGH (ref 70–99)

## 2018-06-23 MED ORDER — LABETALOL HCL 100 MG PO TABS
300.0000 mg | ORAL_TABLET | ORAL | Status: DC
  Filled 2018-06-23: qty 3

## 2018-06-23 MED ORDER — LABETALOL HCL 100 MG PO TABS
200.0000 mg | ORAL_TABLET | Freq: Three times a day (TID) | ORAL | Status: DC
  Administered 2018-06-23 – 2018-06-24 (×4): 200 mg via ORAL
  Filled 2018-06-23 (×4): qty 2

## 2018-06-23 MED ORDER — LABETALOL HCL 100 MG PO TABS: 300.0000 mg | ORAL_TABLET | Freq: Once | ORAL | Status: DC

## 2018-06-23 MED ORDER — AMLODIPINE BESYLATE 10 MG PO TABS
10.0000 mg | ORAL_TABLET | Freq: Every day | ORAL | Status: DC
  Administered 2018-06-23: 10 mg via ORAL
  Filled 2018-06-23 (×2): qty 1

## 2018-06-23 MED ORDER — LABETALOL HCL 100 MG PO TABS: 300.0000 mg | ORAL_TABLET | ORAL | Status: DC

## 2018-06-23 MED ORDER — CLONIDINE HCL 0.1 MG PO TABS
0.2000 mg | ORAL_TABLET | Freq: Three times a day (TID) | ORAL | Status: DC
  Administered 2018-06-23 – 2018-06-24 (×4): 0.2 mg via ORAL
  Filled 2018-06-23 (×4): qty 2

## 2018-06-23 MED ORDER — LABETALOL HCL 100 MG PO TABS: 300.0000 mg | ORAL_TABLET | Freq: Two times a day (BID) | ORAL | Status: DC

## 2018-06-23 MED ORDER — CHLORHEXIDINE GLUCONATE CLOTH 2 % EX PADS
6.0000 | MEDICATED_PAD | Freq: Every day | CUTANEOUS | Status: DC
  Administered 2018-06-24: 6 via TOPICAL

## 2018-06-23 MED ORDER — LISINOPRIL 20 MG PO TABS
20.0000 mg | ORAL_TABLET | Freq: Every day | ORAL | Status: DC
  Administered 2018-06-23: 20 mg via ORAL
  Filled 2018-06-23 (×2): qty 1

## 2018-06-23 NOTE — Progress Notes (Signed)
Carrie [name] Progress Note   Subjective:  No new /co  Objective Vitals:   06/23/18 0955 06/23/18 1123 06/23/18 1130 06/23/18 1145  BP: (!) 157/76 137/69 (!) 145/76 (!) 147/68  Pulse:      Resp:   19 20  Temp:      TempSrc:      SpO2:      Weight:      Height:       Physical Exam General:NAD, awake, Ox 3 Heart: RRR, no mrg Lungs: clear bilat no rales or wheezing Abdomen: soft, ND, +BS Extremities: no LE edema Dialysis Access: LU AVG cannulated   Home meds:  - amlodipine 10 hs/ labetalol 300 mg bid non HD days + 300 qd HD days/ lisinopril 20 hs  - insulin aspart protamine 70/30 mix 15-25 qam and 15 qhs  - metoclopramide 10 tid/ ferric citrate 420mg  tid ac/ carbamazepine 200 hs  - prn's    [facility]  MWF  4hr  84kg (last achieved 3/2) Hep 3000   LU AVG Calcitriol 1.75 PO TIW Aranesp 150 q2weeks (last given 2/19)  Assessment/Plan: 1. DKA vs HHS - BS>600 on arrival, well controlled at this time.  Labs improved. per primary 2. Encephalopathy - Hypertensive encephalopathy vs DKA/HHS. Resolved, back to baseline.  3. HTN urgency - back on nicardipine gtt. Getting 3 home BP meds + clonidine added 0.2 bid.  BP should respond to home meds just restarted today.  4. ESRD - HD MWF. HD Monday.  5. Anemia of CKD- Hgb 11.1. No indication for ESA at this time.  Monitor.  6. Secondary hyperparathyroidism - Ca at goal. Will check phos.  Continue VDRA and binders 7. Nutrition - Renal diet w/fluid restrictions. Protein supplements, renal vitamins    Rob [facility] 06/21/2018, 10:52 AM  Filed Weights   06/19/18 1203 06/21/18 0705 06/21/18 1157  Weight: 84 kg 84.7 kg 84 kg    Intake/Output Summary (Last 24 hours) at 06/23/2018 1244 Last data filed at 06/23/2018 0900 Gross per 24 hour  Intake 757.63 ml  Output -  Net 757.63 ml    Additional Objective Labs: Basic Metabolic Panel: Recent Labs  Lab 06/20/18 0927 06/21/18 0611 06/22/18 0414  06/23/18 0458  NA  --  135 137 134*  K  --  3.8 3.8 3.5  CL  --  100 102 98  CO2  --  20* 25 20*  GLUCOSE  --  205* 133* 157*  BUN  --  36* 22* 34*  CREATININE  --  7.26* 5.49* 7.40*  CALCIUM  --  8.9 8.8* 8.9  PHOS 2.5  --   --   --    Liver Function Tests: Recent Labs  Lab 06/19/18 1620  AST 43*  ALT 55*  ALKPHOS 107  BILITOT 1.2  PROT 6.7  ALBUMIN 3.8   Recent Labs  Lab 06/19/18 1620  LIPASE 25   CBC: Recent Labs  Lab 06/19/18 1445 06/19/18 2155 06/21/18 0611 06/22/18 0414 06/23/18 0458  WBC 5.1 5.5 8.9 5.2 6.9  NEUTROABS 4.3  --   --   --   --   HGB 11.3* 11.1* 9.6* 10.5* 10.7*  HCT 35.0* 33.8* 29.6* 33.1* 33.6*  MCV 95.4 92.3 95.5 94.8 96.6  PLT 135* 136* 136* 161 217    Cardiac Enzymes: Recent Labs  Lab 06/19/18 1620 06/19/18 2155 06/20/18 0409 06/20/18 0927  TROPONINI 0.06* 0.07* 0.11* 0.10*   CBG: Recent Labs  Lab 06/22/18 1209 06/22/18  [place] 06/22/18 1916 06/22/18 2103 06/23/18 0758  GLUCAP 150* 126* 245* 206* 182*    Medications: . sodium chloride    . dexmedetomidine (PRECEDEX) IV infusion Stopped (06/23/18 0450)  . niCARDipine 6 mg/hr (06/23/18 1014)   . amLODipine  10 mg Oral QHS  . carBAMazepine  200 mg Oral Q1200  . Chlorhexidine Gluconate Cloth  6 each Topical Q0600  . cloNIDine  0.2 mg Oral TID  . ferric citrate  420 mg Oral TID WC  . heparin  5,000 Units Subcutaneous Q8H  . insulin aspart  0-15 Units Subcutaneous TID WC  . insulin aspart  0-5 Units Subcutaneous QHS  . insulin glargine  8 Units Subcutaneous Daily  . labetalol  200 mg Oral TID  . lisinopril  20 mg Oral QHS  . sodium chloride flush  10-40 mL Intracatheter Q12H    Dialysis Orders: [facility]  MWF  T4hr  180NR EDW 84kg (last achieved 3/2) Heparin 3000 units bolus   LU AVG Calcitriol 1.75 PO TIW Aranesp 150 q2weeks (last given 2/19)  Assessment/Plan: 1. DKA vs HHS - BS>600 on arrival, well controlled at this time.  Labs improved. per primary 2.  Encephalopathy - Hypertensive encephalopathy vs Meds vs DKA/HHS - combative in HD today requiring sedation d/t concerns for safety. 3. HTN emergency- BP significantly elevated. On home meds, amlodipine, labetalol.   4. Chest pain - cardiology consulting.  5. ESRD - HD today off schedule. Sedated d/t combative behavior. Plans for HD tomorrow per regular schedule. K 3.7.  6. Anemia of CKD- Hgb 11.1. No indication for ESA at this time.  Monitor.  7. Secondary hyperparathyroidism - Ca at goal. Will check phos.  Continue VDRA and binders. 8. Volume - Does not appear grossly volume overloaded.  Will continue to titrate down volume as tolerated.  Need standing weights when able.  At dry wt 9. Nutrition - Renal diet w/fluid restrictions. Protein supplements, renal vitamins 10. Gastroparesis    [facility] 06/23/2018, 12:44 PM

## 2018-06-23 NOTE — Progress Notes (Signed)
PROGRESS NOTE    Carrie [name]  MWN:[id number] DOB: Apr 06, 1981 DOA: 06/19/2018 PCP: Glendon Axe, MD  Outpatient Specialists:   Brief Narrative: Carrie [name] a 38 y.o.femalewith past medical history significant forDM1, ESRD on HD, HTN, gastroparesis. Patient presented from HD where it was reported that patient developed CP and SOB after about 20 min of Hemodialysis session.  Patient was found to have significantly elevated blood pressure, as well as, elevated blood sugar.  Due to concerns for possible PRES, patient was transferred to the ICU team.  Patient has been on Cardene drip.  Blood pressure seems to be improving.  Neurology input is highly appreciated.  discussed with patient's mother and stepfather extensively.  Altered mentation continues to improve.  MRI brain did not reveal any acute process, and did not show characteristic findings of PRES.  06/23/2018: Patient seen.  Patient looks a lot better today.  Encephalopathy has improved significantly.  Blood pressure control has also improved significantly.  Systolic blood pressure is 147 mmHg.  Will start oral antihypertensives, and gradually wean patient off of Cardene.  No hemodialysis planned today.  Assessment & Plan:   Active Problems:   DKA (diabetic ketoacidoses) (HCC)   Gastroparesis   ESRD on hemodialysis (HCC)   Chest pain   Nausea & vomiting   HHS (hypothenar hammer syndrome) (HCC)   Acute encephalopathy likely metabolic: -Initial concerns for PRES, get MRI of the brain was nonrevealing. -DKA has resolved -Hypertensive urgency is been treated -Altered mentation is improving -Further management depend on hospital course. 06/23/2018: Improved significantly.  Continue to manage expectantly.  DKA slight diabetes mellitus: Resolved. Continue to optimize blood sugar management. 06/23/2018: Blood sugar control has improved significantly.  Hypertensive urgency: Continue Cardene drip for now. Eventually transitioned to  oral medication. 06/23/2018: Start oral antihypertensives.  Generally discontinue Cardene drip.  ESRD on hemodialysis: Patient was dialyzed daily last 2 days. No plans for dialysis today. No indication for emergent hemodialysis. 06/23/2018: Likely hemodialysis in the morning.  Patient's normal schedule is Monday, Wednesday and Friday.  Diabetic gastroparesis: Manage expectantly.   DVT prophylaxis: Subcu to heparin Code Status: Full code Family Communication: Mother and stepfather Disposition Plan: This will depend on hospital course.   Consultants:   Neurology  Pulmonary/critical care team for Precedex  Procedures:   None  Antimicrobials:   None   Subjective: No new complaints Encephalopathy has improved significantly.   BP control has improved.  Objective: Vitals:   06/23/18 1530 06/23/18 1545 06/23/18 1600 06/23/18 1615  BP: (!) 148/82 139/76 (!) 142/83 133/72  Pulse:      Resp:      Temp:      TempSrc:      SpO2:      Weight:      Height:        Intake/Output Summary (Last 24 hours) at 06/23/2018 1633 Last data filed at 06/23/2018 1400 Gross per 24 hour  Intake 1090.9 ml  Output -  Net 1090.9 ml   Filed Weights   06/19/18 1203 06/21/18 0705 06/21/18 1157  Weight: 84 kg 84.7 kg 84 kg    Examination:  General exam: Appears calm and comfortable.   Respiratory system: Clear to auscultation.  Cardiovascular system: S1 & S2 heard. No pedal edema. Gastrointestinal system: Abdomen is nondistended, soft and nontender. No organomegaly or masses felt. Normal bowel sounds heard. Central nervous system: Alert and oriented.  Patient moves all limbs. Extremities: No leg edema.  Data Reviewed: I have personally reviewed  following labs and imaging studies  CBC: Recent Labs  Lab 06/19/18 1445 06/19/18 2155 06/21/18 0611 06/22/18 0414 06/23/18 0458  WBC 5.1 5.5 8.9 5.2 6.9  NEUTROABS 4.3  --   --   --   --   HGB 11.3* 11.1* 9.6* 10.5* 10.7*  HCT 35.0*  33.8* 29.6* 33.1* 33.6*  MCV 95.4 92.3 95.5 94.8 96.6  PLT 135* 136* 136* 161 237   Basic Metabolic Panel: Recent Labs  Lab 06/19/18 2155 06/20/18 0214 06/20/18 0927 06/21/18 0611 06/22/18 0414 06/23/18 0458  NA 132* 132*  --  135 137 134*  K 3.7 3.7  --  3.8 3.8 3.5  CL 91* 94*  --  100 102 98  CO2 18* 23  --  20* 25 20*  GLUCOSE 376* 111*  --  205* 133* 157*  BUN 49* 49*  --  36* 22* 34*  CREATININE 9.95* 9.71*  --  7.26* 5.49* 7.40*  CALCIUM 9.8 9.5  --  8.9 8.8* 8.9  PHOS  --   --  2.5  --   --   --    GFR: Estimated Creatinine Clearance: 12 mL/min (A) (by C-G formula based on SCr of 7.4 mg/dL (H)). Liver Function Tests: Recent Labs  Lab 06/19/18 1620  AST 43*  ALT 55*  ALKPHOS 107  BILITOT 1.2  PROT 6.7  ALBUMIN 3.8   Recent Labs  Lab 06/19/18 1620  LIPASE 25   No results for input(s): AMMONIA in the last 168 hours. Coagulation Profile: No results for input(s): INR, PROTIME in the last 168 hours. Cardiac Enzymes: Recent Labs  Lab 06/19/18 1620 06/19/18 2155 06/20/18 0409 06/20/18 0927  TROPONINI 0.06* 0.07* 0.11* 0.10*   BNP (last 3 results) No results for input(s): PROBNP in the last 8760 hours. HbA1C: Recent Labs    06/21/18 0808  HGBA1C 9.7*   CBG: Recent Labs  Lab 06/22/18 1519 06/22/18 1916 06/22/18 2103 06/23/18 0758 06/23/18 1622  GLUCAP 126* 245* 206* 182* 251*   Lipid Profile: No results for input(s): CHOL, HDL, LDLCALC, TRIG, CHOLHDL, LDLDIRECT in the last 72 hours. Thyroid Function Tests: No results for input(s): TSH, T4TOTAL, FREET4, T3FREE, THYROIDAB in the last 72 hours. Anemia Panel: No results for input(s): VITAMINB12, FOLATE, FERRITIN, TIBC, IRON, RETICCTPCT in the last 72 hours. Urine analysis:    Component Value Date/Time   COLORURINE YELLOW 10/08/2017 1700   APPEARANCEUR HAZY (A) 10/08/2017 1700   LABSPEC 1.009 10/08/2017 1700   PHURINE 8.0 10/08/2017 1700   GLUCOSEU >=500 (A) 10/08/2017 1700   HGBUR SMALL  (A) 10/08/2017 1700   HGBUR large 05/25/2009 0900   BILIRUBINUR NEGATIVE 10/08/2017 1700   BILIRUBINUR neg 10/23/2012 1810   KETONESUR NEGATIVE 10/08/2017 1700   PROTEINUR >=300 (A) 10/08/2017 1700   UROBILINOGEN 0.2 04/16/2013 2155   NITRITE NEGATIVE 10/08/2017 1700   LEUKOCYTESUR LARGE (A) 10/08/2017 1700   Sepsis Labs: @LABRCNTIP (procalcitonin:4,lacticidven:4)  ) Recent Results (from the past 240 hour(s))  MRSA PCR Screening     Status: None   Collection Time: 06/20/18  6:05 AM  Result Value Ref Range Status   MRSA by PCR NEGATIVE NEGATIVE Final    Comment:        The GeneXpert MRSA Assay (FDA approved for NASAL specimens only), is one component of a comprehensive MRSA colonization surveillance program. It is not intended to diagnose MRSA infection nor to guide or monitor treatment for MRSA infections. Performed at [facility], [address] [place],  [place] [address]          Radiology Studies: Mr Brain Wo Contrast  Result Date: 06/21/2018 CLINICAL DATA:  38 y/o  F; encephalopathy. EXAM: MRI HEAD WITHOUT CONTRAST TECHNIQUE: Multiplanar, multiecho pulse sequences of the brain and surrounding structures were obtained without intravenous contrast. COMPARISON:  06/21/2018 CT head FINDINGS: Brain: No acute infarction, hemorrhage, hydrocephalus, extra-axial collection or mass lesion. Age advanced diffuse volume loss of the brain. No additional structural or signal abnormality is evident. Vascular: Normal flow voids. Skull and upper cervical spine: Normal marrow signal. Sinuses/Orbits: Small mucous retention cysts within the sphenoid sinus and the right maxillary sinus. No abnormal signal of mastoid air cells. Right intra-ocular lens replacement. Other: None. IMPRESSION: 1. No acute intracranial abnormality identified. 2. Stable diffuse age advanced volume loss of the brain. Electronically Signed   By: Kristine Garbe M.D.   On: 06/21/2018 18:17         Scheduled Meds: . amLODipine  10 mg Oral QHS  . carBAMazepine  200 mg Oral Q1200  . [START ON 06/24/2018] Chlorhexidine Gluconate Cloth  6 each Topical Q0600  . cloNIDine  0.2 mg Oral TID  . ferric citrate  420 mg Oral TID WC  . heparin  5,000 Units Subcutaneous Q8H  . insulin aspart  0-15 Units Subcutaneous TID WC  . insulin aspart  0-5 Units Subcutaneous QHS  . insulin glargine  8 Units Subcutaneous Daily  . labetalol  200 mg Oral TID  . lisinopril  20 mg Oral QHS  . sodium chloride flush  10-40 mL Intracatheter Q12H   Continuous Infusions: . sodium chloride    . dexmedetomidine (PRECEDEX) IV infusion Stopped (06/23/18 0450)  . niCARDipine 9 mg/hr (06/23/18 1440)     LOS: 4 days    Time spent: 63 Minutes    Dana Allan, MD  [facility] Pager #: [phone] 7PM-7AM contact night coverage as above

## 2018-06-24 LAB — GLUCOSE, CAPILLARY
Glucose-Capillary: 211 mg/dL — ABNORMAL HIGH (ref 70–99)
Glucose-Capillary: 204 mg/dL — ABNORMAL HIGH (ref 70–99)
Glucose-Capillary: 130 mg/dL — ABNORMAL HIGH (ref 70–99)

## 2018-06-24 LAB — CBC
HCT: 29.4 % — ABNORMAL LOW (ref 36.0–46.0)
Hemoglobin: 9.5 g/dL — ABNORMAL LOW (ref 12.0–15.0)
MCH: 31.1 pg (ref 26.0–34.0)
MCHC: 32.3 g/dL (ref 30.0–36.0)
MCV: 96.4 fL (ref 80.0–100.0)
Platelets: 214 10*3/uL (ref 150–400)
RBC: 3.05 MIL/uL — AB (ref 3.87–5.11)
RDW: 18.2 % — ABNORMAL HIGH (ref 11.5–15.5)
WBC: 5.7 10*3/uL (ref 4.0–10.5)
nRBC: 0 % (ref 0.0–0.2)

## 2018-06-24 LAB — BASIC METABOLIC PANEL
Anion gap: 13 (ref 5–15)
BUN: 34 mg/dL — ABNORMAL HIGH (ref 6–20)
CO2: 23 mmol/L (ref 22–32)
Calcium: 8.6 mg/dL — ABNORMAL LOW (ref 8.9–10.3)
Chloride: 96 mmol/L — ABNORMAL LOW (ref 98–111)
Creatinine, Ser: 8.94 mg/dL — ABNORMAL HIGH (ref 0.44–1.00)
GFR calc Af Amer: 6 mL/min — ABNORMAL LOW (ref >60)
GFR calc non Af Amer: 5 mL/min — ABNORMAL LOW (ref >60)
Glucose, Bld: 224 mg/dL — ABNORMAL HIGH (ref 70–99)
Potassium: 3.9 mmol/L (ref 3.5–5.1)
Sodium: 132 mmol/L — ABNORMAL LOW (ref 135–145)

## 2018-06-24 MED ORDER — FREESTYLE LIBRE READER DEVI: 1.0000 | Freq: Every day | 0 refills | Status: DC

## 2018-06-24 MED ORDER — LABETALOL HCL 200 MG PO TABS: 200.0000 mg | ORAL_TABLET | Freq: Three times a day (TID) | ORAL | 0 refills | Status: DC

## 2018-06-24 MED ORDER — CLONIDINE HCL 0.2 MG PO TABS: 0.2000 mg | ORAL_TABLET | Freq: Three times a day (TID) | ORAL | 0 refills | Status: DC

## 2018-06-24 MED ORDER — FREESTYLE LIBRE 14 DAY SENSOR MISC: 14.0000 | Freq: Every day | 0 refills | Status: DC

## 2018-06-24 NOTE — Discharge Summary (Signed)
DISCHARGE SUMMARY  Carrie [name]  MR#: [id number]  DOB:[birth year]  Date of Admission: 06/19/2018 Date of Discharge: 06/24/2018  Attending Physician:Jeffrey Hennie Duos, MD  Patient's MVH:[name], Carrie Jenny, MD  Consults: PCCM Nephrology Neurology   Disposition: D/C home   Follow-up Appts: Follow-up Information    Glendon Axe, MD. Schedule an appointment as soon as possible for a visit in 5 day(s).   Specialty:  Family Medicine Contact information: [place] [address] [phone]        Kathlene Cote, MD Follow up in 1 week(s).   Specialty:  Endocrinology Contact information: [place] [address] [phone]           Tests Needing Follow-up: -assess DM control and monitoring at home  -assess HTN control   Discharge Diagnoses: Acute encephalopathy likely metabolic DKA - Uncontrolled DM1 Hypertensive urgency ESRD on hemodialysis Anemia of CKD Diabetic gastroparesis  Initial presentation: 38yo w/ a hx of DM1, ESRD on HD, HTN, and gastroparesis who presented from HD w/ CP and SOB after about 20 min of her hemodialysis session. She was found to have significantly elevated blood pressure, as well as elevated blood sugar.   She required admission to the ICU for dosing w/ a cardene drip.   Hospital Course:  Acute encephalopathy likely metabolic Initial concerns for PRES, but MRI brain was not convincing for this - MS has slowly improved w/ resolution of DKA and Hypertensive urgency - MS has returned to baseline at time of d/c per Nephrology who knows pt   DKA - Uncontrolled DM1 Pt does not check her CBG at home - DM Coordinator has educated her on use of the [place], which I have prescribed at d/c - she is to f/u w/ her Endocrinologist within 2 weeks post d/c   Hypertensive urgency Was able to be transitioned from Cardene drip to oral medication - BP reasonably controlled at time of d/c   ESRD on  hemodialysis normal schedule is Monday, Wednesday and Friday - had HD on day of D/C   Anemia of CKD Hgb 11.1 - no indication for ESA at this time per Nephrology   Diabetic gastroparesis Manage expectantly  Allergies as of 06/24/2018   No Known Allergies     Medication List    STOP taking these medications   benzonatate 200 MG capsule Commonly known as:  TESSALON   diphenhydramine-acetaminophen 25-500 MG Tabs tablet Commonly known as:  TYLENOL PM   gabapentin 300 MG capsule Commonly known as:  NEURONTIN   Muscle Rub 10-15 % Crea   pregabalin 75 MG capsule Commonly known as:  Lyrica   promethazine 25 MG tablet Commonly known as:  PHENERGAN   spironolactone 25 MG tablet Commonly known as:  ALDACTONE   Tylenol Cold Multi-Symptom Day 5-10-200-325 MG/15ML Liqd Generic drug:  Phenylephrine-DM-GG-APAP     TAKE these medications   amLODipine 10 MG tablet Commonly known as:  NORVASC Take 10 mg by mouth at bedtime.   carbamazepine 200 MG tablet Commonly known as:  TEGRETOL Take 1 tablet (200 mg total) by mouth at bedtime.   cloNIDine 0.2 MG tablet Commonly known as:  CATAPRES Take 1 tablet (0.2 mg total) by mouth 3 (three) times daily.   ferric citrate 1 GM 210 MG(Fe) tablet Commonly known as:  AURYXIA Take 420 mg by mouth 3 (three) times daily with meals.   FreeStyle Libre 14 Day Sensor Misc 14 Devices by Does not apply route daily.  FreeStyle Libre Reader Devi 1 Device by Does not apply route daily.   insulin aspart protamine - aspart (70-30) 100 UNIT/ML FlexPen Commonly known as:  NovoLOG Mix 70/30 FlexPen Inject 0.15-0.25 mLs (15-25 Units total) into the skin See admin instructions. On non-dialysis days inj 25 units in the morning and 15 units at night. On dialysis days inj 15 units in the morning and 15 units at night. What changed:    how much to take  when to take this  additional instructions   labetalol 200 MG tablet Commonly known as:   NORMODYNE Take 1 tablet (200 mg total) by mouth 3 (three) times daily. What changed:    medication strength  how much to take  when to take this  additional instructions   lisinopril 20 MG tablet Commonly known as:  PRINIVIL,ZESTRIL Take 20 mg by mouth at bedtime.   metoCLOPramide 10 MG tablet Commonly known as:  Reglan Take 1 tablet (10 mg total) by mouth 3 (three) times daily before meals.   ondansetron 4 MG disintegrating tablet Commonly known as:  ZOFRAN-ODT Take 4 mg by mouth 2 (two) times daily as needed for nausea or vomiting.   traMADol 100 MG 24 hr tablet Commonly known as:  ULTRAM-ER Take 100 mg by mouth daily as needed for moderate pain.       Day of Discharge BP (!) 144/78   Pulse 84   Temp 98.7 F (37.1 C) (Oral)   Resp 17   Ht 5\' 8"  (1.727 m)   Wt 82.9 kg   LMP 07/16/2016   SpO2 100%   BMI 27.79 kg/m   Physical Exam: General: No acute respiratory distress Lungs: Clear to auscultation bilaterally without wheezes or crackles Cardiovascular: Regular rate and rhythm without murmur gallop or rub normal S1 and S2 Abdomen: Nontender, nondistended, soft, bowel sounds positive, no rebound, no ascites, no appreciable mass Extremities: No significant cyanosis, clubbing, or edema bilateral lower extremities  Basic Metabolic Panel: Recent Labs  Lab 06/20/18 0214 06/20/18 0927 06/21/18 0611 06/22/18 0414 06/23/18 0458 06/24/18 0516  NA 132*  --  135 137 134* 132*  K 3.7  --  3.8 3.8 3.5 3.9  CL 94*  --  100 102 98 96*  CO2 23  --  20* 25 20* 23  GLUCOSE 111*  --  205* 133* 157* 224*  BUN 49*  --  36* 22* 34* 34*  CREATININE 9.71*  --  7.26* 5.49* 7.40* 8.94*  CALCIUM 9.5  --  8.9 8.8* 8.9 8.6*  PHOS  --  2.5  --   --   --   --     Liver Function Tests: Recent Labs  Lab 06/19/18 1620  AST 43*  ALT 55*  ALKPHOS 107  BILITOT 1.2  PROT 6.7  ALBUMIN 3.8   Recent Labs  Lab 06/19/18 1620  LIPASE 25    CBC: Recent Labs  Lab  06/19/18 1445 06/19/18 2155 06/21/18 0611 06/22/18 0414 06/23/18 0458 06/24/18 0516  WBC 5.1 5.5 8.9 5.2 6.9 5.7  NEUTROABS 4.3  --   --   --   --   --   HGB 11.3* 11.1* 9.6* 10.5* 10.7* 9.5*  HCT 35.0* 33.8* 29.6* 33.1* 33.6* 29.4*  MCV 95.4 92.3 95.5 94.8 96.6 96.4  PLT 135* 136* 136* 161 217 214    Cardiac Enzymes: Recent Labs  Lab 06/19/18 1620 06/19/18 2155 06/20/18 0409 06/20/18 0927  TROPONINI 0.06* 0.07* 0.11* 0.10*    CBG:  Recent Labs  Lab 06/23/18 1128 06/23/18 1622 06/23/18 2130 06/24/18 0811 06/24/18 1146  GLUCAP 211* 251* 167* 204* 130*    Recent Results (from the past 240 hour(s))  MRSA PCR Screening     Status: None   Collection Time: 06/20/18  6:05 AM  Result Value Ref Range Status   MRSA by PCR NEGATIVE NEGATIVE Final    Comment:        The GeneXpert MRSA Assay (FDA approved for NASAL specimens only), is one component of a comprehensive MRSA colonization surveillance program. It is not intended to diagnose MRSA infection nor to guide or monitor treatment for MRSA infections. Performed at [facility], [place] [address] [place], [place] [address]      Time spent in discharge (includes decision making & examination of pt): 35 minutes  06/24/2018, 3:56 PM   Cherene Altes, MD [facility] Office  [phone]

## 2018-06-24 NOTE — Progress Notes (Addendum)
Inpatient Diabetes Program Recommendations  AACE/ADA: New Consensus Statement on Inpatient Glycemic Control (2015)  Target Ranges:  Prepandial:   less than 140 mg/dL      Peak postprandial:   less than 180 mg/dL (1-2 hours)      Critically ill patients:  140 - 180 mg/dL   Lab Results  Component Value Date   GLUCAP 204 (H) 06/24/2018   HGBA1C 9.7 (H) 06/21/2018    Review of Glycemic Control Results for TIJAH, HANE (MRN [id number]) as of 06/24/2018 10:13  Ref. Range 06/23/2018 11:28 06/23/2018 16:22 06/23/2018 21:30 06/24/2018 08:11  Glucose-Capillary Latest Ref Range: 70 - 99 mg/dL 211 (H) 251 (H) 167 (H) 204 (H)   Diabetes history: DM1 (makes NO insulin; requires basal, correction, and meal coverage insulin) Outpatient Diabetes medications: 70/30 15 units BID Current orders for Inpatient glycemic control: Lantus 8 units QHS, Novolog 0-15 units TID with meals, Novolog 0-5 units QHS  Inpatient Diabetes Program Recommendations:  Patient has Type 1 DM and ESRD. Please decrease Novolog correction to 0-9 units (sensitive scale) TID with meals.  If patient is eating and post prandial glucose is consistently greater than 180 mg/dl, please consider ordering Novolog 3 units TID with meals for meal coverage if patient eats at least 50% of meals.  Addendum @1245 : Spoke with patient regarding outaptient diabetes management. Patient sees Dr Denton Lank, endocrinology, who has attempted to order [facility] for patient in the past, however order was incorrect and patient was unable to obtain device. Patient has a follow up appointment within the next 2 weeks. Verified home medications. Patient reports that she usually does not take AM dose of 70/30 on dialysis days because of fear that she will go low and usually does not consume food. Reports multiple episodes of hypoglycemia when taking, may want to consider Am dose of NPH for dialysis days only when patient is not eating prior. Patient is willing.   Reviewed patient's current A1c of 9.7%. Explained what a A1c is and what it measures. Also reviewed goal A1c with patient, importance of good glucose control @ home, and blood sugar goals. Reviewed patho of DN, need for insulin, vascular changes and comorbidites.  Patient reports not checking blood glucose at home and is a candidate for [employer]. She continues to refuse glucose monitoring because she does not like to stick herself. Education provided on [employer], application, benefits, length of time of action, censors, cost and how to use it as a tool. Patient is open to using and is willing to continue with product. Consider ordering Freestyle Libre at discharge. Reader device [phone]), (14 day sensors [phone]). Encouraged patient the importance of checking glucose and the importance of following up.   @1445 : Freestyle Libre applied to patient. Discussed risks and benefits, how to use as a tool, follow up care, cost and when to call MD. Patient to pick up additional sensors from pharmacy and follow up with endo.   Thanks, Bronson Curb, MSN, RNC-OB Diabetes Coordinator [phone] (8a-5p)

## 2018-06-24 NOTE — Discharge Instructions (Signed)
Diabetic Ketoacidosis  Diabetic ketoacidosis is a serious complication of diabetes. This condition develops when there is not enough insulin in the body. Insulin is an hormone that regulates blood sugar levels in the body. Normally, insulin allows glucose to enter the cells in the body. The cells break down glucose for energy. Without enough insulin, the body cannot break down glucose, so it breaks down fats instead. This leads to high blood glucose levels in the body and the production of acids that are called ketones. Ketones are poisonous at high levels.  If diabetic ketoacidosis is not treated, it can cause severe dehydration and can lead to a coma or death.  What are the causes?  This condition develops when a lack of insulin causes the body to break down fats instead of glucose. This may be triggered by:  · Stress on the body. This stress is brought on by an illness.  · Infection.  · Medicines that raise blood glucose levels.  · Not taking diabetes medicine.  · New onset of type 1 diabetes mellitus.  What are the signs or symptoms?  Symptoms of this condition include:  · Fatigue.  · Weight loss.  · Excessive thirst.  · Light-headedness.  · Fruity or sweet-smelling breath.  · Excessive urination.  · Vision changes.  · Confusion or irritability.  · Nausea.  · Vomiting.  · Rapid breathing.  · Abdominal pain.  · Feeling flushed.  How is this diagnosed?  This condition is diagnosed based on your medical history, a physical exam, and blood tests. You may also have a urine test to check for ketones.  How is this treated?  This condition may be treated with:  · Fluid replacement. This may be done to correct dehydration.  · Insulin injections. These may be given through the skin or through an IV tube.  · Electrolyte replacement. Electrolytes are minerals in your blood. Electrolytes such as potassium and sodium may be given in pill form or through an IV tube.  · Antibiotic medicines. These may be prescribed if your  condition was caused by an infection.  Diabetic ketoacidosis is a serious medical condition. You may need emergency treatment in the hospital to monitor your condition.  Follow these instructions at home:  Eating and drinking  · Drink enough fluids to keep your urine clear or pale yellow.  · If you are not able to eat, drink clear fluids in small amounts as you are able. Clear fluids include water, ice chips, fruit juice with water added (diluted), and low-calorie sports drinks. You may also have sugar-free jello or popsicles.  · If you are able to eat, follow your usual diet and drink sugar-free liquids, such as water.  Medicines  · Take over-the-counter and prescription medicines only as told by your health care provider.  · Continue to take insulin and other diabetes medicines as told by your health care provider.  · If you were prescribed an antibiotic, take it as told by your health care provider. Do not stop taking the antibiotic even if you start to feel better.  General instructions    · Check your urine for ketones when you are ill and as told by your health care provider.  ? If your blood glucose is 240 mg/dL (13.3 mmol/L) or higher, check your urine ketones every 4-6 hours.  · Check your blood glucose every day, as often as told by your health care provider.  ? If your blood glucose is high, drink   plenty of fluids. This helps to flush out ketones.  ? If your blood glucose is above your target for 2 tests in a row, contact your health care provider.  · Carry a medical alert card or wear medical alert jewelry that says that you have diabetes.  · Rest and exercise only as told by your health care provider. Do not exercise when your blood glucose is high and you have ketones in your urine.  · If you get sick, call your health care provider and begin treatment quickly. Your body often needs extra insulin to fight an illness. Check your blood glucose every 4-6 hours when you are sick.  · Keep all follow-up  visits as told by your health care provider. This is important.  Contact a health care provider if:  · Your blood glucose level is higher than 240 mg/dL (13.3 mmol/L) for 2 days in a row.  · You have moderate or large ketones in your urine.  · You have a fever.  · You cannot eat or drink without vomiting.  · You have been vomiting for more than 2 hours.  · You continue to have symptoms of diabetic ketoacidosis.  · You develop new symptoms.  Get help right away if:  · Your blood glucose monitor reads “high” even when you are taking insulin.  · You faint.  · You have chest pain.  · You have trouble breathing.  · You have sudden trouble speaking or swallowing.  · You have vomiting or diarrhea that gets worse after 3 hours.  · You are unable to stay awake.  · You have trouble thinking.  · You are severely dehydrated. Symptoms of severe dehydration include:  ? Extreme thirst.  ? Dry mouth.  ? Rapid breathing.  These symptoms may represent a serious problem that is an emergency. Do not wait to see if the symptoms will go away. Get medical help right away. Call your local emergency services (911 in the [place] Do not drive yourself to the hospital.  Summary  · Diabetic ketoacidosis is a serious complication of diabetes. This condition develops when there is not enough insulin in the body.  · This condition is diagnosed based on your medical history, a physical exam, and blood tests. You may also have a urine test to check for ketones.  · Diabetic ketoacidosis is a serious medical condition. You may need emergency treatment in the hospital to monitor your condition.  · Contact your health care provider if your blood glucose is higher than 240 mg/dl for 2 days in a row or if you have moderate or large ketones in your urine.  This information is not intended to replace advice given to you by your health care provider. Make sure you discuss any questions you have with your health care provider.  Document Released: 03/31/2000  Document Revised: 05/08/2016 Document Reviewed: 05/08/2016  Elsevier Interactive Patient Education © 2019 [employer]

## 2018-06-24 NOTE — Progress Notes (Signed)
[facility] Progress Note   Assessment/ Plan:   [place] MWF  4hr 84kg (last achieved 3/2) Hep 3000   LU AVG Calcitriol 1.75 PO TIW Aranesp 150 q2weeks (last given 2/19)  Assessment/Plan: 1. DKA vs HHS - BS>600 on arrival, well controlled at this time.  Labs improved. per primary 2. Encephalopathy - Hypertensive encephalopathy vs DKA/HHS. Resolved, back to baseline.  3. HTN urgency - off nicardipine gtt. On labetalol 200 TID, lisinopril 20 mg daily, amlodipine 10 mg daily, and clonidine 0.2 TID (new).  Pressures are better.  Improved after HD.  Was at EDW pre HD, likely will need EDW adjusted. 4. ESRD - HD MWF. HD Monday.  5. Anemia of CKD- Hgb 11.1. No indication for ESA at this time.  Monitor.  6. Secondary hyperparathyroidism - Ca at goal. Will check phos.  Continue VDRA and binders 7. Nutrition - Renal diet w/fluid restrictions. Protein supplements, renal vitamins  Subjective:    Feeling better.  Doesn't recall being in ICU.     Objective:   BP (!) 148/76   Pulse 91   Temp 98.7 F (37.1 C) (Oral)   Resp 14   Ht 5\' 8"  (1.727 m)   Wt 83.9 kg   LMP 07/16/2016   SpO2 100%   BMI 28.12 kg/m   Physical Exam: Gen: NAD, just finishing dialysis CVS: RRR loud S2 Resp: clear bilaterally Abd: soft nontender Ext: no LE edema  Labs: BMET Recent Labs  Lab 06/19/18 1620 06/19/18 2155 06/20/18 0214 06/20/18 0927 06/21/18 0611 06/22/18 0414 06/23/18 0458 06/24/18 0516  NA 127* 132* 132*  --  135 137 134* 132*  K 4.1 3.7 3.7  --  3.8 3.8 3.5 3.9  CL 87* 91* 94*  --  100 102 98 96*  CO2 22 18* 23  --  20* 25 20* 23  GLUCOSE 810* 376* 111*  --  205* 133* 157* 224*  BUN 43* 49* 49*  --  36* 22* 34* 34*  CREATININE 9.56* 9.95* 9.71*  --  7.26* 5.49* 7.40* 8.94*  CALCIUM 9.1 9.8 9.5  --  8.9 8.8* 8.9 8.6*  PHOS  --   --   --  2.5  --   --   --   --    CBC Recent Labs  Lab 06/19/18 1445  06/21/18 0611 06/22/18 0414 06/23/18 0458 06/24/18 0516  WBC 5.1    < > 8.9 5.2 6.9 5.7  NEUTROABS 4.3  --   --   --   --   --   HGB 11.3*   < > 9.6* 10.5* 10.7* 9.5*  HCT 35.0*   < > 29.6* 33.1* 33.6* 29.4*  MCV 95.4   < > 95.5 94.8 96.6 96.4  PLT 135*   < > 136* 161 217 214   < > = values in this interval not displayed.    @IMGRELPRIORS @ Medications:    . amLODipine  10 mg Oral QHS  . carBAMazepine  200 mg Oral Q1200  . Chlorhexidine Gluconate Cloth  6 each Topical Q0600  . cloNIDine  0.2 mg Oral TID  . ferric citrate  420 mg Oral TID WC  . heparin  5,000 Units Subcutaneous Q8H  . insulin aspart  0-15 Units Subcutaneous TID WC  . insulin aspart  0-5 Units Subcutaneous QHS  . insulin glargine  8 Units Subcutaneous Daily  . labetalol  200 mg Oral TID  . lisinopril  20 mg Oral QHS  . sodium chloride  flush  10-40 mL Intracatheter Q12H     Madelon Lips, MD [facility] pgr [phone] 06/24/2018, 11:45 AM

## 2018-08-01 ENCOUNTER — Ambulatory Visit: Payer: BLUE CROSS/BLUE SHIELD | Admitting: Neurology

## 2018-09-28 ENCOUNTER — Other Ambulatory Visit: Payer: Self-pay | Admitting: Endocrinology

## 2018-10-16 ENCOUNTER — Encounter (HOSPITAL_COMMUNITY): Payer: Self-pay

## 2018-10-16 ENCOUNTER — Other Ambulatory Visit: Payer: Self-pay

## 2018-10-16 ENCOUNTER — Observation Stay (HOSPITAL_COMMUNITY)
Admission: EM | Admit: 2018-10-16 | Discharge: 2018-10-17 | Disposition: A | Discharge status: Home / Self Care | Payer: BC Managed Care – PPO | Attending: Family Medicine | Admitting: Family Medicine

## 2018-10-16 ENCOUNTER — Emergency Department (HOSPITAL_COMMUNITY): Payer: BC Managed Care – PPO

## 2018-10-16 DIAGNOSIS — Z79899 Other long term (current) drug therapy: Secondary | ICD-10-CM | POA: Insufficient documentation

## 2018-10-16 DIAGNOSIS — Z841 Family history of disorders of kidney and ureter: Secondary | ICD-10-CM | POA: Insufficient documentation

## 2018-10-16 DIAGNOSIS — E1043 Type 1 diabetes mellitus with diabetic autonomic (poly)neuropathy: Secondary | ICD-10-CM | POA: Insufficient documentation

## 2018-10-16 DIAGNOSIS — E7800 Pure hypercholesterolemia, unspecified: Secondary | ICD-10-CM | POA: Diagnosis not present

## 2018-10-16 DIAGNOSIS — E8779 Other fluid overload: Secondary | ICD-10-CM | POA: Diagnosis present

## 2018-10-16 DIAGNOSIS — K3184 Gastroparesis: Secondary | ICD-10-CM | POA: Diagnosis present

## 2018-10-16 DIAGNOSIS — G43909 Migraine, unspecified, not intractable, without status migrainosus: Secondary | ICD-10-CM | POA: Diagnosis not present

## 2018-10-16 DIAGNOSIS — E875 Hyperkalemia: Principal | ICD-10-CM | POA: Diagnosis present

## 2018-10-16 DIAGNOSIS — N186 End stage renal disease: Secondary | ICD-10-CM

## 2018-10-16 DIAGNOSIS — E8770 Fluid overload, unspecified: Secondary | ICD-10-CM | POA: Insufficient documentation

## 2018-10-16 DIAGNOSIS — E1022 Type 1 diabetes mellitus with diabetic chronic kidney disease: Secondary | ICD-10-CM | POA: Diagnosis present

## 2018-10-16 DIAGNOSIS — Z794 Long term (current) use of insulin: Secondary | ICD-10-CM | POA: Diagnosis not present

## 2018-10-16 DIAGNOSIS — N185 Chronic kidney disease, stage 5: Secondary | ICD-10-CM | POA: Diagnosis present

## 2018-10-16 DIAGNOSIS — E1065 Type 1 diabetes mellitus with hyperglycemia: Secondary | ICD-10-CM

## 2018-10-16 DIAGNOSIS — I160 Hypertensive urgency: Secondary | ICD-10-CM | POA: Diagnosis present

## 2018-10-16 DIAGNOSIS — E1010 Type 1 diabetes mellitus with ketoacidosis without coma: Secondary | ICD-10-CM | POA: Diagnosis present

## 2018-10-16 DIAGNOSIS — Z992 Dependence on renal dialysis: Secondary | ICD-10-CM | POA: Insufficient documentation

## 2018-10-16 DIAGNOSIS — R197 Diarrhea, unspecified: Secondary | ICD-10-CM | POA: Diagnosis not present

## 2018-10-16 DIAGNOSIS — Z833 Family history of diabetes mellitus: Secondary | ICD-10-CM | POA: Insufficient documentation

## 2018-10-16 DIAGNOSIS — I120 Hypertensive chronic kidney disease with stage 5 chronic kidney disease or end stage renal disease: Secondary | ICD-10-CM | POA: Diagnosis not present

## 2018-10-16 DIAGNOSIS — Z1159 Encounter for screening for other viral diseases: Secondary | ICD-10-CM | POA: Insufficient documentation

## 2018-10-16 DIAGNOSIS — E10319 Type 1 diabetes mellitus with unspecified diabetic retinopathy without macular edema: Secondary | ICD-10-CM | POA: Diagnosis not present

## 2018-10-16 LAB — I-STAT CHEM 8, ED
BUN: 96 mg/dL — ABNORMAL HIGH (ref 6–20)
Calcium, Ion: 1.04 mmol/L — ABNORMAL LOW (ref 1.15–1.40)
Chloride: 87 mmol/L — ABNORMAL LOW (ref 98–111)
Creatinine, Ser: 12.7 mg/dL — ABNORMAL HIGH (ref 0.44–1.00)
Glucose, Bld: 414 mg/dL — ABNORMAL HIGH (ref 70–99)
HCT: 33 % — ABNORMAL LOW (ref 36.0–46.0)
Hemoglobin: 11.2 g/dL — ABNORMAL LOW (ref 12.0–15.0)
Potassium: 7.9 mmol/L (ref 3.5–5.1)
Sodium: 120 mmol/L — ABNORMAL LOW (ref 135–145)
TCO2: 25 mmol/L (ref 22–32)

## 2018-10-16 LAB — CBG MONITORING, ED
Glucose-Capillary: 440 mg/dL — ABNORMAL HIGH (ref 70–99)
Glucose-Capillary: 300 mg/dL — ABNORMAL HIGH (ref 70–99)

## 2018-10-16 LAB — BASIC METABOLIC PANEL
Anion gap: 19 — ABNORMAL HIGH (ref 5–15)
BUN: 92 mg/dL — ABNORMAL HIGH (ref 6–20)
CO2: 21 mmol/L — ABNORMAL LOW (ref 22–32)
Calcium: 9.3 mg/dL (ref 8.9–10.3)
Chloride: 83 mmol/L — ABNORMAL LOW (ref 98–111)
Creatinine, Ser: 12.56 mg/dL — ABNORMAL HIGH (ref 0.44–1.00)
GFR calc Af Amer: 4 mL/min — ABNORMAL LOW (ref >60)
GFR calc non Af Amer: 3 mL/min — ABNORMAL LOW (ref >60)
Glucose, Bld: 431 mg/dL — ABNORMAL HIGH (ref 70–99)
Potassium: >7.5 mmol/L (ref 3.5–5.1)
Sodium: 123 mmol/L — ABNORMAL LOW (ref 135–145)

## 2018-10-16 LAB — GLUCOSE, CAPILLARY
Glucose-Capillary: 107 mg/dL — ABNORMAL HIGH (ref 70–99)
Glucose-Capillary: 112 mg/dL — ABNORMAL HIGH (ref 70–99)
Glucose-Capillary: 149 mg/dL — ABNORMAL HIGH (ref 70–99)
Glucose-Capillary: 89 mg/dL (ref 70–99)
Glucose-Capillary: 98 mg/dL (ref 70–99)

## 2018-10-16 LAB — CBC
HCT: 29.8 % — ABNORMAL LOW (ref 36.0–46.0)
Hemoglobin: 9.5 g/dL — ABNORMAL LOW (ref 12.0–15.0)
MCH: 30.8 pg (ref 26.0–34.0)
MCHC: 31.9 g/dL (ref 30.0–36.0)
MCV: 96.8 fL (ref 80.0–100.0)
Platelets: 178 10*3/uL (ref 150–400)
RBC: 3.08 MIL/uL — ABNORMAL LOW (ref 3.87–5.11)
RDW: 17.4 % — ABNORMAL HIGH (ref 11.5–15.5)
WBC: 6.4 10*3/uL (ref 4.0–10.5)
nRBC: 0 % (ref 0.0–0.2)

## 2018-10-16 LAB — SARS CORONAVIRUS 2 BY RT PCR (HOSPITAL ORDER, PERFORMED IN ~~LOC~~ HOSPITAL LAB): SARS Coronavirus 2: NEGATIVE

## 2018-10-16 LAB — I-STAT BETA HCG BLOOD, ED (MC, WL, AP ONLY): I-stat hCG, quantitative: <5 m[IU]/mL (ref <5)

## 2018-10-16 MED ORDER — INSULIN REGULAR(HUMAN) IN NACL 100-0.9 UT/100ML-% IV SOLN
INTRAVENOUS | Status: DC
  Administered 2018-10-16: 1.9 [IU]/h via INTRAVENOUS
  Filled 2018-10-16: qty 100

## 2018-10-16 MED ORDER — ONDANSETRON HCL 4 MG PO TABS: 4.0000 mg | ORAL_TABLET | Freq: Four times a day (QID) | ORAL | Status: DC | PRN

## 2018-10-16 MED ORDER — SODIUM CHLORIDE 0.9% FLUSH
3.0000 mL | Freq: Once | INTRAVENOUS | Status: AC
  Administered 2018-10-16: 3 mL via INTRAVENOUS

## 2018-10-16 MED ORDER — CALCIUM GLUCONATE 10 % IV SOLN
1.0000 g | Freq: Once | INTRAVENOUS | Status: AC
  Administered 2018-10-16: 1 g via INTRAVENOUS
  Filled 2018-10-16: qty 10

## 2018-10-16 MED ORDER — HYDRALAZINE HCL 20 MG/ML IJ SOLN
10.0000 mg | INTRAMUSCULAR | Status: DC | PRN
  Administered 2018-10-16: 10 mg via INTRAVENOUS

## 2018-10-16 MED ORDER — ONDANSETRON HCL 4 MG/2ML IJ SOLN
4.0000 mg | Freq: Four times a day (QID) | INTRAMUSCULAR | Status: DC | PRN
  Administered 2018-10-16: 22:00:00 4 mg via INTRAVENOUS
  Filled 2018-10-16: qty 2

## 2018-10-16 MED ORDER — ACETAMINOPHEN 650 MG RE SUPP: 650.0000 mg | Freq: Four times a day (QID) | RECTAL | Status: DC | PRN

## 2018-10-16 MED ORDER — HEPARIN SODIUM (PORCINE) 5000 UNIT/ML IJ SOLN
5000.0000 [IU] | Freq: Three times a day (TID) | INTRAMUSCULAR | Status: DC
  Administered 2018-10-16 – 2018-10-17 (×3): 5000 [IU] via SUBCUTANEOUS
  Filled 2018-10-16 (×3): qty 1

## 2018-10-16 MED ORDER — CHLORHEXIDINE GLUCONATE CLOTH 2 % EX PADS
6.0000 | MEDICATED_PAD | Freq: Every day | CUTANEOUS | Status: DC
  Administered 2018-10-17: 6 via TOPICAL

## 2018-10-16 MED ORDER — INSULIN ASPART 100 UNIT/ML IV SOLN
10.0000 [IU] | Freq: Once | INTRAVENOUS | Status: AC
  Administered 2018-10-16: 10 [IU] via INTRAVENOUS

## 2018-10-16 MED ORDER — DEXTROSE-NACL 5-0.45 % IV SOLN
INTRAVENOUS | Status: DC
  Administered 2018-10-16: 22:00:00 via INTRAVENOUS

## 2018-10-16 MED ORDER — POLYETHYLENE GLYCOL 3350 17 G PO PACK: 17.0000 g | PACK | Freq: Every day | ORAL | Status: DC | PRN

## 2018-10-16 MED ORDER — SODIUM ZIRCONIUM CYCLOSILICATE 10 G PO PACK
10.0000 g | PACK | Freq: Once | ORAL | Status: AC
  Administered 2018-10-16: 10 g via ORAL
  Filled 2018-10-16: qty 1

## 2018-10-16 MED ORDER — ACETAMINOPHEN 325 MG PO TABS
650.0000 mg | ORAL_TABLET | Freq: Four times a day (QID) | ORAL | Status: DC | PRN
  Administered 2018-10-17: 650 mg via ORAL
  Filled 2018-10-16: qty 2

## 2018-10-16 MED ORDER — SODIUM CHLORIDE 0.9 % IV SOLN: INTRAVENOUS | Status: DC

## 2018-10-16 MED ORDER — SODIUM BICARBONATE 8.4 % IV SOLN
50.0000 meq | Freq: Once | INTRAVENOUS | Status: AC
  Administered 2018-10-16: 50 meq via INTRAVENOUS
  Filled 2018-10-16: qty 50

## 2018-10-16 MED ORDER — HYDRALAZINE HCL 20 MG/ML IJ SOLN
INTRAMUSCULAR | Status: AC
  Filled 2018-10-16: qty 1

## 2018-10-16 MED ORDER — ALBUTEROL SULFATE HFA 108 (90 BASE) MCG/ACT IN AERS
2.0000 | INHALATION_SPRAY | Freq: Once | RESPIRATORY_TRACT | Status: AC
  Administered 2018-10-16: 14:00:00 2 via RESPIRATORY_TRACT
  Filled 2018-10-16: qty 6.7

## 2018-10-16 MED ORDER — SODIUM CHLORIDE 0.9% FLUSH: 3.0000 mL | Freq: Two times a day (BID) | INTRAVENOUS | Status: DC

## 2018-10-16 MED ORDER — OXYCODONE HCL 5 MG PO TABS: 5.0000 mg | ORAL_TABLET | ORAL | Status: DC | PRN

## 2018-10-16 MED ORDER — DEXTROSE-NACL 5-0.45 % IV SOLN: INTRAVENOUS | Status: DC

## 2018-10-16 MED ORDER — ALBUTEROL SULFATE (2.5 MG/3ML) 0.083% IN NEBU
10.0000 mg | INHALATION_SOLUTION | Freq: Once | RESPIRATORY_TRACT | Status: AC
  Administered 2018-10-16: 10 mg via RESPIRATORY_TRACT
  Filled 2018-10-16: qty 12

## 2018-10-16 MED ORDER — LOPERAMIDE HCL 2 MG PO CAPS: 2.0000 mg | ORAL_CAPSULE | ORAL | Status: DC | PRN

## 2018-10-16 MED ORDER — LOPERAMIDE HCL 2 MG PO CAPS
2.0000 mg | ORAL_CAPSULE | Freq: Once | ORAL | Status: AC
  Administered 2018-10-16: 2 mg via ORAL
  Filled 2018-10-16: qty 1

## 2018-10-16 NOTE — ED Notes (Signed)
Pt CBG was 440, notified Michele(RN)

## 2018-10-16 NOTE — Progress Notes (Signed)
Carrie [name] is a 38 Y/O female with ESRD on hemodialysis MWF at [facility]. PMH significant for DMT1, HTN, gastroparesis, DM neuropathy, trigeminal neuralgia, AOCD, SHPT.  Patient presented to ED today after three day history of diarrhea. She missed HD 10/14/18 and again today. Potassium on arrival to ED was found to be 7.5 repeat K+ 7.9 BS 431 Co2 21 AG 19. She has been admitted as observation patient for hyperkalemia and possible DKA. We will manage hemodialysis today and will consult formally if patient status upgraded to in-patient.   Urgent HD today after COVID 19 testing completed. Has rec'd insulin, calcium gluconate and sodium bicarbonate. She has also received lokelma 10 gram PO X 1 dose.   HD orders: [facility] MWF 4 hrs 180NRe 400/Autoflow 1.5 79 kg 2.0 K/2.0 Ca  -Heparin 3000 units IV TIW -Calcitriol 0.5 mcg PO TIW -Venofer 50 mg IV weekly -Mircera 100 mcg IV q 2 weeks (last dose 10/02/18) -Lidocaine 1% prior to HD TIW  Juanell Fairly, NP-C [place] [phone] (Pager)

## 2018-10-16 NOTE — ED Notes (Signed)
Patient has been on cardiac monitor since admit to ED.  Rhythm currently ventricular bigeminy at a rate of 128.  Pt drowsy but able to answer questions.  Respiratory giving tx at this time.   IV patent and meds completed.

## 2018-10-16 NOTE — ED Notes (Signed)
ED TO INPATIENT HANDOFF REPORT  ED Nurse Name and Phone #: Celene Squibb RN  S Name/Age/Gender Carrie [name] 38 y.o. female Room/Bed: 017C/017C  Code Status   Code Status: Full Code  Home/SNF/Other Home Patient oriented to: self, place, time and situation Is this baseline? Yes      Chief Complaint Missed dialysis, diarrhea, gen weakness  Triage Note Patient has missedl ast 2 scheduled sessions of dialysis, Monday and today. Complains of weakness and back pain, alert and oriented, Denies fever, nol CP   Allergies No Known Allergies  Level of Care/Admitting Diagnosis ED Disposition    ED Disposition Condition [facility] [facility]: [place] [100100]  Level of Care: Progressive [102]  I expect the patient will be discharged within 24 hours: No (not a candidate for 5C-Observation unit)  Covid Evaluation: Screening Protocol (No Symptoms)  Diagnosis: Hyperkalemia [546503]  Admitting Physician: Lady Deutscher [[id number]]  Attending Physician: Lady Deutscher [[id number]]  PT Class (Do Not Modify): Observation [104]  PT Acc Code (Do Not Modify): Observation [10022]       B Medical/Surgery History Past Medical History:  Diagnosis Date  . Anemia   . Diabetic neuropathy ([place])   . Diabetic retinopathy ([place])   . ESRD (end stage renal disease) on dialysis [facility])    "MWF; [place]" (02/02/2017)  . Gastroparesis   . HA (headache)    "qd if I didn't take my RX" (02/02/2017)  . Hemodialysis access site with arteriovenous graft ([place])   . Hypertension   . Neuropathy   . Renal insufficiency   . Trigeminal neuralgia   . Type I diabetes mellitus ([place])    Past Surgical History:  Procedure Laterality Date  . AV FISTULA PLACEMENT Left 09/07/2016   Procedure: Left arm 1st Stage Basilic AV Fistula;  Surgeon: [name], MD;  [facility] OR;  Service: Vascular;  Laterality: Left;  . DILATION AND CURETTAGE OF UTERUS    . EXCISIONAL  HEMORRHOIDECTOMY    . EYE SURGERY    . INSERTION OF DIALYSIS CATHETER Right 09/07/2016   Procedure: INSERTION OF DIALYSIS CATHETER;  Surgeon: [name], MD;  Location: [place];  Service: Vascular;  Laterality: Right;  . IR FLUORO GUIDE CV LINE LEFT  06/20/2018  . IR US GUIDE VASC ACCESS LEFT  06/20/2018  . RETINAL LASER PROCEDURE Bilateral    "related to diabetes"  . REVISON OF ARTERIOVENOUS FISTULA Left 01/15/2017   Procedure: INSERTION OF  AV GORE-TEX GRAFT;  Surgeon: Elam Dutch, MD;  Location: [place];  Service: Vascular;  Laterality: Left;  . TEE WITHOUT CARDIOVERSION N/A 01/11/2017   Procedure: TRANSESOPHAGEAL ECHOCARDIOGRAM (TEE);  Surgeon: Jerline Pain, MD;  Location: [facility] ENDOSCOPY;  Service: Cardiovascular;  Laterality: N/A;     A IV Location/Drains/Wounds Patient Lines/Drains/Airways Status   Active Line/Drains/Airways    Name:   Placement date:   Placement time:   Site:   Days:   Peripheral IV 10/16/18 Right Antecubital   10/16/18    1412    Antecubital   less than 1   Fistula / Graft Left Forearm Arteriovenous fistula   09/07/16    0917    Forearm   769   Fistula / Graft Left Forearm Arteriovenous vein graft   01/15/17    1155    Forearm   639   Hemodialysis Catheter Right Internal jugular Double-lumen;Permanent   09/07/16    0803    Internal jugular  769   Incision (Closed) 09/07/16 Neck Right   09/07/16    0814     769   Incision (Closed) 09/07/16 Arm Left   09/07/16    0909     769   Incision (Closed) 01/15/17 Arm Left   01/15/17    1158     639   Incision (Closed) 01/15/17 Arm Left   01/15/17    1235     639          Intake/Output Last 24 hours No intake or output data in the 24 hours ending 10/16/18 1622  Labs/Imaging Results for orders placed or performed during the hospital encounter of 10/16/18 (from the past 48 hour(s))  Basic metabolic panel     Status: Abnormal   Collection Time: 10/16/18 12:40 PM  Result Value Ref Range   Sodium 123 (L) 135 - 145  mmol/L   Potassium >7.5 (HH) 3.5 - 5.1 mmol/L    Comment: NO VISIBLE HEMOLYSIS CRITICAL RESULT CALLED TO, READ BACK BY AND VERIFIED WITH: M Ajai Harville RN [phone] BY A BENNETT    Chloride 83 (L) 98 - 111 mmol/L   CO2 21 (L) 22 - 32 mmol/L   Glucose, Bld 431 (H) 70 - 99 mg/dL   BUN 92 (H) 6 - 20 mg/dL   Creatinine, Ser 12.56 (H) 0.44 - 1.00 mg/dL   Calcium 9.3 8.9 - 10.3 mg/dL   GFR calc non Af Amer 3 (L) >60 mL/min   GFR calc Af Amer 4 (L) >60 mL/min   Anion gap 19 (H) 5 - 15    Comment: Performed at [facility], [address], [place], [place] [address]  CBC     Status: Abnormal   Collection Time: 10/16/18 12:40 PM  Result Value Ref Range   WBC 6.4 4.0 - 10.5 K/uL   RBC 3.08 (L) 3.87 - 5.11 MIL/uL   Hemoglobin 9.5 (L) 12.0 - 15.0 g/dL   HCT 29.8 (L) 36.0 - 46.0 %   MCV 96.8 80.0 - 100.0 fL   MCH 30.8 26.0 - 34.0 pg   MCHC 31.9 30.0 - 36.0 g/dL   RDW 17.4 (H) 11.5 - 15.5 %   Platelets 178 150 - 400 K/uL   nRBC 0.0 0.0 - 0.2 %    Comment: Performed at [facility] [address] [place],  [address]  I-Stat beta hCG blood, ED     Status: None   Collection Time: 10/16/18 12:52 PM  Result Value Ref Range   I-stat hCG, quantitative <5.0 <5 mIU/mL   Comment 3            Comment:   GEST. AGE      CONC.  (mIU/mL)   <=1 WEEK        5 - 50     2 WEEKS       50 - 500     3 WEEKS       100 - 10,000     4 WEEKS     1,000 - 30,000        FEMALE AND NON-PREGNANT FEMALE:     LESS THAN 5 mIU/mL   CBG monitoring, ED     Status: Abnormal   Collection Time: 10/16/18  2:05 PM  Result Value Ref Range   Glucose-Capillary 440 (H) 70 - 99 mg/dL   Comment 1 Notify RN    Comment 2 Document in Chart   I-stat chem 8, ED (  not at [facility] or [facility])     Status: Abnormal   Collection Time: 10/16/18  2:15 PM  Result Value Ref Range   Sodium 120 (L) 135 - 145 mmol/L   Potassium 7.9 (HH) 3.5 - 5.1 mmol/L   Chloride 87 (L) 98 - 111 mmol/L   BUN 96 (H) 6 - 20 mg/dL   Creatinine,  Ser 12.70 (H) 0.44 - 1.00 mg/dL   Glucose, Bld 414 (H) 70 - 99 mg/dL   Calcium, Ion 1.04 (L) 1.15 - 1.40 mmol/L   TCO2 25 22 - 32 mmol/L   Hemoglobin 11.2 (L) 12.0 - 15.0 g/dL   HCT 33.0 (L) 36.0 - 46.0 %   Comment NOTIFIED PHYSICIAN   CBG monitoring, ED     Status: Abnormal   Collection Time: 10/16/18  3:59 PM  Result Value Ref Range   Glucose-Capillary 300 (H) 70 - 99 mg/dL   Comment 1 Notify RN    Comment 2 Document in Chart    Dg Chest Port 1 View  Result Date: 10/16/2018 CLINICAL DATA:  Shortness of breath EXAM: PORTABLE CHEST 1 VIEW COMPARISON:  June 19, 2018 FINDINGS: The lung volumes low. The heart size is enlarged. There is volume overload without overt pulmonary edema. There is no pneumothorax. No acute osseous abnormality. IMPRESSION: Cardiomegaly with volume overload. Electronically Signed   By: Constance Holster M.D.   On: 10/16/2018 13:58    Pending Labs Unresulted Labs (From admission, onward)    Start     Ordered   10/17/18 9937  Basic metabolic panel  Tomorrow morning,   R     10/16/18 1457   10/17/18 0500  CBC  Tomorrow morning,   R     10/16/18 1457   10/16/18 1512  C difficile quick scan w PCR reflex  (C Difficile quick screen w PCR reflex panel)  Once, for 24 hours,   STAT     10/16/18 1511   10/16/18 1511  Gastrointestinal Panel by PCR , Stool  (Gastrointestinal Panel by PCR, Stool)  Once,   STAT     10/16/18 1511   10/16/18 1457  Blood gas, venous  Once,   STAT     10/16/18 1456   10/16/18 1452  CBC  (heparin)  Once,   STAT    Comments: Baseline for heparin therapy IF NOT ALREADY DRAWN.  Notify MD if PLT < 100 K.    10/16/18 1457   10/16/18 1452  Creatinine, serum  (heparin)  Once,   STAT    Comments: Baseline for heparin therapy IF NOT ALREADY DRAWN.    10/16/18 1457   10/16/18 1429  SARS Coronavirus 2 (CEPHEID - Performed in [place] hospital lab), [place]  (Asymptomatic Patients Labs)  Once,   STAT    Question:  Rule Out  Answer:  Yes    10/16/18 1428   10/16/18 1234  Urinalysis, Routine w reflex microscopic  Once,   STAT     10/16/18 1234   Signed and Held  Renal function panel  Once,   R     Signed and Held   Signed and Held  CBC  Once,   R     Signed and Held          Vitals/Pain Today's Vitals   10/16/18 1400 10/16/18 1445 10/16/18 1500 10/16/18 1515  BP:  (!) 216/115 (!) 216/106 (!) 199/105  Pulse: (!) 114 76    Resp: 20 17 16  (!) 21  Temp:      SpO2:  100%    PainSc:        Isolation Precautions Enteric precautions (UV disinfection)  Medications Medications  Chlorhexidine Gluconate Cloth 2 % PADS 6 each (has no administration in time range)  sodium zirconium cyclosilicate (LOKELMA) packet 10 g (has no administration in time range)  insulin regular, human (MYXREDLIN) 100 units/ 100 mL infusion (has no administration in time range)  heparin injection 5,000 Units (has no administration in time range)  acetaminophen (TYLENOL) tablet 650 mg (has no administration in time range)    Or  acetaminophen (TYLENOL) suppository 650 mg (has no administration in time range)  oxyCODONE (Oxy IR/ROXICODONE) immediate release tablet 5 mg (has no administration in time range)  polyethylene glycol (MIRALAX / GLYCOLAX) packet 17 g (has no administration in time range)  ondansetron (ZOFRAN) tablet 4 mg (has no administration in time range)    Or  ondansetron (ZOFRAN) injection 4 mg (has no administration in time range)  loperamide (IMODIUM) capsule 2 mg (has no administration in time range)  hydrALAZINE (APRESOLINE) injection 10 mg (has no administration in time range)  sodium chloride flush (NS) 0.9 % injection 3 mL (3 mLs Intravenous Given 10/16/18 1427)  calcium gluconate inj 10% (1 g) URGENT USE ONLY! (1 g Intravenous Given 10/16/18 1417)  albuterol (VENTOLIN HFA) 108 (90 Base) MCG/ACT inhaler 2 puff (2 puffs Inhalation Given 10/16/18 1359)  albuterol (PROVENTIL) (2.5 MG/3ML) 0.083% nebulizer solution 10 mg (10 mg  Nebulization Given 10/16/18 1421)  insulin aspart (novoLOG) injection 10 Units (10 Units Intravenous Given 10/16/18 1436)  sodium bicarbonate injection 50 mEq (50 mEq Intravenous Given 10/16/18 1425)  loperamide (IMODIUM) capsule 2 mg (2 mg Oral Given 10/16/18 1539)    Mobility walks Low fall risk   Focused Assessments Cardiac Assessment Handoff:    Lab Results  Component Value Date   TROPONINI 0.10 ([place]) 06/20/2018   Lab Results  Component Value Date   DDIMER <0.27 04/16/2013   Does the Patient currently have chest pain? No     R Recommendations: See Admitting Provider Note  Report given to:   Additional Notes:

## 2018-10-16 NOTE — ED Notes (Signed)
Pt  slightly agitated in bed, moves around frequently.  Reports IV team usually starts her IV.  Consult placed.  Pt able to make phone calls at this time to family.

## 2018-10-16 NOTE — H&P (Signed)
History and Physical    Carrie [name] GMW:[id number] DOB: 1980/06/04 DOA: 10/16/2018  PCP: Glendon Axe, MD  Patient coming from: Home  I have personally briefly reviewed patient's old medical records in [place]  Chief Complaint: Weakness, diarrhea, missed dialysis x2.  HPI: Carrie [name] is a 38 y.o. female with medical history significant of end-stage renal disease on dialysis Monday Wednesday Friday, hypertension, type 1 diabetes with gastroparesis, and diabetic neuropathy who presents the emergency department complaining of generalized weakness, nausea, and diarrhea for 2 days.  On Monday she began having diarrhea and missed her dialysis appointment as she was directed not to go because of the diarrhea.  She had to use the bathroom every hour and was told that they could not take her off the machine for her to use the bathroom.  Today patient missed her dialysis again because she continued to feel weak and nauseous and was continuing to have episodes of diarrhea.  She is complaining also of shortness of breath and significant fatigue.  She denies fever, cough, changes in taste, headache, vomiting, wheezing, rales, dysuria, and dizziness.  ED Course: EKG shows peaked T waves in bigeminy potassium level is 7.5.  Dr. Justin Mend from nephrology urgently consulted for dialysis.  Anion gap is elevated and patient with increased respiratory rate and Kussmaul type respirations concerning for DKA.  Venous blood gas is pending to check bicarb level.  CO2 appears to be acceptable on peripheral draw.  Patient referred to me for further evaluation and management due to diarrhea, volume overload from missed dialysis, hyperkalemia from missed dialysis, and concern for possible DKA.  Review of Systems: As per HPI otherwise all other systems reviewed and  negative.  Past Medical History:  Diagnosis Date  . Anemia   . Diabetic neuropathy ([place])   . Diabetic retinopathy ([place])   . ESRD (end stage renal  disease) on dialysis [facility])    "MWF; [place]" (02/02/2017)  . Gastroparesis   . HA (headache)    "qd if I didn't take my RX" (02/02/2017)  . Hemodialysis access site with arteriovenous graft ([place])   . Hypertension   . Neuropathy   . Renal insufficiency   . Trigeminal neuralgia   . Type I diabetes mellitus ([place])     Past Surgical History:  Procedure Laterality Date  . AV FISTULA PLACEMENT Left 09/07/2016   Procedure: Left arm 1st Stage Basilic AV Fistula;  Surgeon: [name], MD;  Location: [facility] OR;  Service: Vascular;  Laterality: Left;  . DILATION AND CURETTAGE OF UTERUS    . EXCISIONAL HEMORRHOIDECTOMY    . EYE SURGERY    . INSERTION OF DIALYSIS CATHETER Right 09/07/2016   Procedure: INSERTION OF DIALYSIS CATHETER;  Surgeon: [name], MD;  Location: [place];  Service: Vascular;  Laterality: Right;  . IR FLUORO GUIDE CV LINE LEFT  06/20/2018  . IR US GUIDE VASC ACCESS LEFT  06/20/2018  . RETINAL LASER PROCEDURE Bilateral    "related to diabetes"  . REVISON OF ARTERIOVENOUS FISTULA Left 01/15/2017   Procedure: INSERTION OF  AV GORE-TEX GRAFT;  Surgeon: Elam Dutch, MD;  Location: [place];  Service: Vascular;  Laterality: Left;  . TEE WITHOUT CARDIOVERSION N/A 01/11/2017   Procedure: TRANSESOPHAGEAL ECHOCARDIOGRAM (TEE);  Surgeon: Jerline Pain, MD;  Location: [facility] END[facility];  Service: Cardiovascular;  Laterality: N/A;    Social History   Social History Narrative   Patient lives at home with her father and daughter.  Education some college.   Right handed   Caffeine Five sodas daily.           reports that she has never smoked. She has never used smokeless tobacco. She reports current alcohol use of about 4.0 standard drinks of alcohol per week. She reports that she does not use drugs.  No Known Allergies  Family History  Problem Relation Age of Onset  . Hyperlipidemia Mother   . Asthma Daughter   . Diabetes Maternal Grandmother   . Stroke Maternal Grandmother    . Kidney disease Maternal Grandfather   . Kidney disease Paternal Grandmother     Prior to Admission medications   Medication Sig Start Date End Date Taking? Authorizing Provider  amLODipine (NORVASC) 10 MG tablet Take 10 mg by mouth at bedtime. 10/01/17   [provider]  carbamazepine (TEGRETOL) 200 MG tablet Take 1 tablet (200 mg total) by mouth at bedtime. 05/07/17   Annita Brod, MD  cloNIDine (CATAPRES) 0.2 MG tablet Take 1 tablet (0.2 mg total) by mouth 3 (three) times daily. 06/24/18   Cherene Altes, MD  Continuous Blood Gluc Receiver (FREESTYLE LIBRE READER) DEVI 1 Device by Does not apply route daily. 06/24/18   Cherene Altes, MD  Continuous Blood Gluc Sensor (FREESTYLE LIBRE 14 DAY SENSOR) MISC 14 Devices by Does not apply route daily. 06/24/18   Cherene Altes, MD  ferric citrate (AURYXIA) 1 GM 210 MG(Fe) tablet Take 420 mg by mouth 3 (three) times daily with meals.     [provider]  insulin aspart protamine - aspart (NOVOLOG MIX 70/30 FLEXPEN) (70-30) 100 UNIT/ML FlexPen Inject 0.15-0.25 mLs (15-25 Units total) into the skin See admin instructions. On non-dialysis days inj 25 units in the morning and 15 units at night. On dialysis days inj 15 units in the morning and 15 units at night. Patient taking differently: Inject 15 Units into the skin 3 (three) times daily with meals.  10/10/17   Hongalgi, Lenis Dickinson, MD  labetalol (NORMODYNE) 200 MG tablet Take 1 tablet (200 mg total) by mouth 3 (three) times daily. 06/24/18   Cherene Altes, MD  lisinopril (PRINIVIL,ZESTRIL) 20 MG tablet Take 20 mg by mouth at bedtime.     [provider]  metoCLOPramide (REGLAN) 10 MG tablet Take 1 tablet (10 mg total) by mouth 3 (three) times daily before meals. 05/28/18   Ward, Delice Bison, DO  ondansetron (ZOFRAN-ODT) 4 MG disintegrating tablet Take 4 mg by mouth 2 (two) times daily as needed for nausea or vomiting.  01/05/18   [provider]  traMADol  (ULTRAM-ER) 100 MG 24 hr tablet Take 100 mg by mouth daily as needed for moderate pain. 05/21/18   [provider]    Physical Exam:  Constitutional: Acutely ill-appearing female in moderate respiratory distress speaking in 2 word phrases.  Weak with some lethargy. Vitals:   10/16/18 1315 10/16/18 1330 10/16/18 1345 10/16/18 1400  BP: (!) 166/102 (!) 171/92 (!) 183/102   Pulse: 73 72 72 (!) 114  Resp: 17 (!) 28 (!) 27 20  Temp:      SpO2: 98% 94% 90%    Eyes: PERRL, lids and conjunctivae normal, periorbital edema noted ENMT: Mucous membranes are moist. Posterior pharynx clear of any exudate or lesions.Normal dentition.  Neck: normal, supple, no masses, no thyromegaly Respiratory: Coarse with decreased auscultation bilaterally, no wheezing, moderate crackles halfway up.  Increased respiratory effort.  Positive accessory muscle use.  Kussmaul  type respirations Cardiovascular: Regular rate and rhythm, no murmurs / rubs / gallops.  Non-pitting extremity edema. 2+ pedal pulses. No carotid bruits.  Positive HJR positive JVD to the angle of the jaw Abdomen: no tenderness, no masses palpated. No hepatosplenomegaly. Bowel sounds positive.  Musculoskeletal: no clubbing / cyanosis. No joint deformity upper and lower extremities. Good ROM, no contractures. Normal muscle tone.  Skin: no rashes, lesions, ulcers. No induration Neurologic: CN 2-12 grossly intact. Sensation intact, DTR normal. Strength 5/5 in all 4.  Psychiatric: Normal judgment and insight. Alert and oriented x 3.  Lethargic mood.    Labs on Admission: I have personally reviewed following labs and imaging studies  CBC: Recent Labs  Lab 10/16/18 1240 10/16/18 1415  WBC 6.4  --   HGB 9.5* 11.2*  HCT 29.8* 33.0*  MCV 96.8  --   PLT 178  --    Basic Metabolic Panel: Recent Labs  Lab 10/16/18 1240 10/16/18 1415  NA 123* 120*  K >7.5* 7.9*  CL 83* 87*  CO2 21*  --   GLUCOSE 431* 414*  BUN 92* 96*  CREATININE 12.56*  12.70*  CALCIUM 9.3  --    CBG: Recent Labs  Lab 10/16/18 1405  GLUCAP 440*   Urine analysis:    Component Value Date/Time   COLORURINE YELLOW 10/08/2017 1700   APPEARANCEUR HAZY (A) 10/08/2017 1700   LABSPEC 1.009 10/08/2017 1700   PHURINE 8.0 10/08/2017 1700   GLUCOSEU >=500 (A) 10/08/2017 1700   HGBUR SMALL (A) 10/08/2017 1700   HGBUR large 05/25/2009 0900   BILIRUBINUR NEGATIVE 10/08/2017 1700   BILIRUBINUR neg 10/23/2012 1810   KETONESUR NEGATIVE 10/08/2017 1700   PROTEINUR >=300 (A) 10/08/2017 1700   UROBILINOGEN 0.2 04/16/2013 2155   NITRITE NEGATIVE 10/08/2017 1700   LEUKOCYTESUR LARGE (A) 10/08/2017 1700    Radiological Exams on Admission: Dg Chest Port 1 View  Result Date: 10/16/2018 CLINICAL DATA:  Shortness of breath EXAM: PORTABLE CHEST 1 VIEW COMPARISON:  June 19, 2018 FINDINGS: The lung volumes low. The heart size is enlarged. There is volume overload without overt pulmonary edema. There is no pneumothorax. No acute osseous abnormality. IMPRESSION: Cardiomegaly with volume overload. Electronically Signed   By: Constance Holster M.D.   On: 10/16/2018 13:58    EKG: Independently reviewed. Sinus tachycardia with 1st degree A-V block with frequent Premature ventricular complexes in a pattern of Bigeminy. Possible Left atrial enlargement. Left axis deviation. Right bundle branch block Abnormal ECG T waves are new and interventricular conduction delay is increased compared to previous from 10/05/2018.  Assessment/Plan Principal Problem:   Hyperkalemia Active Problems:   Hypertensive urgency   Diarrhea   Volume overload state of heart   DKA, type 1, not at goal [facility])   CKD stage 5 due to type 1 diabetes mellitus ([place])   Gastroparesis   ESRD on hemodialysis ([place])    1.  Hyperkalemia with volume overload state of heart in a patient with end-stage renal disease on hemodialysis: Patient requires urgent dialysis given cardiac overload and hyperkalemia.  Dr. Justin Mend  has been consulted and has already entered dialysis orders.  2.  Possible type I DKA with diabetes not at goal: Arterial blood gases pending CO2 appears to be normal but anion gap is markedly elevated at 19.  DKA orders have been written as I strongly suspect that this is the case given patient's physical examination (Kussmaul respirations).  Either way she will require IV insulin given a glucose of 419  and an open anion gap.  Cannot receive IV fluids to bring her glucoses down as she is a dialysis patient and does not make sufficient urine  3.  Diarrhea: We will check gastroenteritis panel as well as stool for C. difficile.  Imodium given in cautious dosing.  4.  Hypertensive urgency: Likely related to missed dialysis and volume overload state.  Will monitor closely and give hydralazine PRN for elevated blood pressures.  5.  CKD stage V due to type 1 diabetes mellitus with end-stage renal disease on hemodialysis: As noted above.  Patient for dialysis today.  6.  Gastroparesis: Related to type 1 diabetes.  Continue supportive care.  DVT prophylaxis: Subcu heparin Code Status: Full code Family Communication: No family present patient retains capacity. Disposition Plan: Likely home in 24 to 48 hours. Consults called: Nephrology, Dr. Justin Mend Admission status: Observation  It is my clinical opinion that referral for OBSERVATION is reasonable and necessary in this patient based on the above information provided. The aforementioned taken together are felt to place the patient at high risk for further clinical deterioration. However it is anticipated that the patient may be medically stable for discharge from the hospital within 24 to 48 hours.    Lady Deutscher MD FACP [facility] Pager [phone]  How to contact the [facility] Attending or Consulting provider [place] or covering provider during after hours [place], for this patient?  1. Check the care team in [facility] and look for a)  attending/consulting [facility] provider listed and b) the [facility] team listed 2. Log into [email or web address] and use 's universal password to access. If you do not have the password, please contact the hospital operator. 3. Locate the [facility] provider you are looking for under [facility] and page to a number that you can be directly reached. 4. If you still have difficulty reaching the provider, please page the [facility] (Director on Call) for the Hospitalists listed on amion for assistance.  If 7PM-7AM, please contact night-coverage [email or web address] Password [email or web address]  10/16/2018, 3:00 PM

## 2018-10-16 NOTE — ED Notes (Signed)
Pt admits to diarrhea stools frequently over the past few days which lead to not being able to go into dialysis.   Has used immodium with some relief.  Last liquid this AM at 1000.   No Abx use.

## 2018-10-16 NOTE — ED Triage Notes (Signed)
Patient has missedl ast 2 scheduled sessions of dialysis, Monday and today. Complains of weakness and back pain, alert and oriented, Denies fever, nol CP

## 2018-10-16 NOTE — ED Provider Notes (Signed)
Medical screening examination/treatment/procedure(s) were conducted as a shared visit with non-physician practitioner(s) and myself.  I personally evaluated the patient during the encounter.  EKG Interpretation  Date/Time:  Wednesday October 16 2018 12:40:07 EDT Ventricular Rate:  149 PR Interval:  236 QRS Duration: 164 QT Interval:  252 QTC Calculation: 396 R Axis:   -33 Text Interpretation:  Sinus tachycardia with 1st degree A-V block with frequent Premature ventricular complexes in a pattern of bigeminy Possible Left atrial enlargement Left axis deviation Right bundle branch block Abnormal ECG peaked t waves and widened IVCD compared to previous Confirmed by Charlesetta Shanks [phone]) on 10/16/2018 1:51:45 PM  Consult: Have reviewed with Dr. Justin Mend and will arrange emergent dialysis. Consult: Have reviewed Dr. Evangeline Gula for admission.  Patient has missed 2 dialysis appointments due to being sick.  She reports she is having diarrhea nearly every hour.  She reports she feels very weak and tremulous.  She reports she is now so weak she has difficulty walking independently.  Patient is alert and has clear mental status.  She does appear ill.  Heart regular no gross rub murmur gallop.  No respiratory distress lungs clear.  Abdomen without guarding.  No peripheral edema.  Patient is acutely ill with severe hyperkalemia.  Hyperkalemia treatment initiated.  Nephrology consult placed.   Charlesetta Shanks, MD 10/17/18 [phone]

## 2018-10-16 NOTE — ED Provider Notes (Signed)
[facility] EMERGENCY DEPARTMENT Provider Note   CSN: [id number] Arrival date & time: 10/16/18  1159    History   Chief Complaint Chief Complaint  Patient presents with  . weakness/ missed dialysis    HPI Carrie [name] is a 38 y.o. female with PMHx ESRD on dialysis MWF, HTN, type I diabetes, and gastroparesis who presents to the ED today complaining of generalized weakness, nausea, and diarrhea x 2 days. Pt reports that on Monday she began having diarrhea and missed her dialysis appointment as she had to use the bathroom every hour. Pt states that she again did not go to her dialysis appointment today due to feeling weak. She is also complaining of shortness of breath. Pt denies fever, chills, vomiting, chest pain, abdominal pain. No recent antibiotic use. No suspicious food intake.        Past Medical History:  Diagnosis Date  . Anemia   . Diabetic neuropathy ([place])   . Diabetic retinopathy ([place])   . ESRD (end stage renal disease) on dialysis [facility])    "MWF; [place]" (02/02/2017)  . Gastroparesis   . HA (headache)    "qd if I didn't take my RX" (02/02/2017)  . Hemodialysis access site with arteriovenous graft ([place])   . Hypertension   . Neuropathy   . Renal insufficiency   . Trigeminal neuralgia   . Type I diabetes mellitus [facility])     Patient Active Problem List   Diagnosis Date Noted  . Hyperkalemia 10/16/2018  . Diarrhea 10/16/2018  . Volume overload state of heart 10/16/2018  . DKA, type 1, not at goal [facility]) 10/16/2018  . Acute cystitis without hematuria 10/09/2017  . Hypertensive urgency 10/09/2017  . ESRD (end stage renal disease) ([place]) 02/01/2017  . 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 Pregnancy test positive 12/22/2016  . Amenorrhea 12/12/2016  . Diabetes ([place]) 09/16/2016  . ESRD on hemodialysis ([place])   . Gastroparesis   . Intractable vomiting 09/04/2016  . CKD stage 5 due to type 1 diabetes mellitus ([place]) 09/04/2016  . Intractable vomiting with  nausea 04/04/2015  . AKI (acute kidney injury) ([place]) 04/04/2015  . Trigeminal neuralgia 12/12/2013  . HA (headache)   . DKA, type 2 ([place]) 10/24/2012  . Contact dermatitis 10/24/2012  . SORE THROAT 03/22/2010  . 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 FATIGUE 05/25/2009  . VAGINAL DISCHARGE 01/28/2009  . Carbuncle and furuncle of unspecified site 12/29/2008  . HYPERCHOLESTEROLEMIA 06/12/2008  . MICROALBUMINURIA 06/12/2008  . MONILIAL VAGINITIS 11/14/2007  . INGUINAL PAIN, BILATERAL 11/14/2007  . DYSURIA 10/17/2007  . CYSTITIS 09/05/2007  . DELAYED MENSES 09/05/2007  . RHINOCONJUNCTIVITIS, ALLERGIC 07/19/2007  . DENTAL PAIN 03/28/2007  . HYPERLIPIDEMIA 09/01/2006  . PELVIC INFLAMMATORY DISEASE 09/01/2006  . ABORTION, SPONTANEOUS 09/01/2006  . ABSCESS 09/01/2006  . HX, PERSONAL, PAST NONCOMPLIANCE 09/01/2006    Past Surgical History:  Procedure Laterality Date  . AV FISTULA PLACEMENT Left 09/07/2016   Procedure: Left arm 1st Stage Basilic AV Fistula;  Surgeon: [name], MD;  Location: [facility] OR;  Service: Vascular;  Laterality: Left;  . DILATION AND CURETTAGE OF UTERUS    . EXCISIONAL HEMORRHOIDECTOMY    . EYE SURGERY    . INSERTION OF DIALYSIS CATHETER Right 09/07/2016   Procedure: INSERTION OF DIALYSIS CATHETER;  Surgeon: [name], MD;  Location: [facility];  Service: Vascular;  Laterality: Right;  . IR FLUORO GUIDE CV LINE LEFT  06/20/2018  . IR US GUIDE VASC ACCESS LEFT  06/20/2018  . RETINAL LASER PROCEDURE Bilateral    "related to diabetes"  . REVISON OF ARTERIOVENOUS FISTULA Left 01/15/2017   Procedure: INSERTION OF  AV GORE-TEX GRAFT;  Surgeon: Elam Dutch, MD;  Location: [facility];  Service: Vascular;  Laterality: Left;  . TEE WITHOUT CARDIOVERSION N/A 01/11/2017   Procedure: TRANSESOPHAGEAL ECHOCARDIOGRAM (TEE);  Surgeon: Jerline Pain, MD;  Location[facility] ENDOSCOPY;  Service: Cardiovascular;  Laterality: N/A;     OB History    Gravida  7   Para  1   Term      Preterm   1   AB  5   Living  1     SAB  2   TAB  3   Ectopic      Multiple      Live Births               Home Medications    Prior to Admission medications   Medication Sig Start Date End Date Taking? Authorizing Provider  amLODipine (NORVASC) 10 MG tablet Take 10 mg by mouth at bedtime. 10/01/17  Yes [provider]  Ashwagandha 500 MG CAPS Take 500 mg by mouth daily.   Yes [provider]  carbamazepine (TEGRETOL) 200 MG tablet Take 1 tablet (200 mg total) by mouth at bedtime. 05/07/17  Yes Annita Brod, MD  ferric citrate (AURYXIA) 1 GM 210 MG(Fe) tablet Take 420 mg by mouth 3 (three) times daily with meals.    Yes [provider]  gabapentin (NEURONTIN) 300 MG capsule Take 300-600 mg by mouth See admin instructions. Taking 1 capsule (300mg ) in the Am, and 1 capsule at lunch and 2 Capsules (600mg ) at bedtime. 07/23/18  Yes [provider]  insulin aspart protamine - aspart (NOVOLOG MIX 70/30 FLEXPEN) (70-30) 100 UNIT/ML FlexPen Inject 0.15-0.25 mLs (15-25 Units total) into the skin See admin instructions. On non-dialysis days inj 25 units in the morning and 15 units at night. On dialysis days inj 15 units in the morning and 15 units at night. Patient taking differently: Inject 20 Units into the skin 3 (three) times daily with meals.  10/10/17  Yes Hongalgi, Lenis Dickinson, MD  labetalol (NORMODYNE) 300 MG tablet Take 300 mg by mouth 3 (three) times daily. 08/14/18  Yes [provider]  lisinopril (ZESTRIL) 40 MG tablet Take 40 mg by mouth every evening. 07/28/18  Yes [provider]  metoCLOPramide (REGLAN) 10 MG tablet Take 1 tablet (10 mg total) by mouth 3 (three) times daily before meals. 05/28/18  Yes Ward, Delice Bison, DO  minoxidil (LONITEN) 2.5 MG tablet Take 2.5 mg by mouth 2 (two) times daily. 09/04/18  Yes [provider]  promethazine (PHENERGAN) 25 MG tablet Take 25 mg by mouth every 6 (six) hours as needed. 09/27/18  Yes  [provider]  spironolactone (ALDACTONE) 25 MG tablet Take 25 mg by mouth 2 (two) times daily.   Yes [provider]  traMADol (ULTRAM-ER) 100 MG 24 hr tablet Take 100 mg by mouth daily as needed for moderate pain. 05/21/18  Yes [provider]  cloNIDine (CATAPRES - DOSED IN MG/24 HR) 0.3 mg/24hr patch Place 0.3 mg onto the skin once a week. Not using yet 08/26/18   [provider]  cloNIDine (CATAPRES) 0.2 MG tablet Take 1 tablet (0.2 mg total) by mouth 3 (three) times daily. Patient not taking: Reported on 10/16/2018 06/24/18   Cherene Altes, MD  Continuous Blood Gluc Receiver (  FREESTYLE LIBRE READER) DEVI 1 Device by Does not apply route daily. 06/24/18   Cherene Altes, MD  Continuous Blood Gluc Sensor (FREESTYLE LIBRE 14 DAY SENSOR) MISC 14 Devices by Does not apply route daily. 06/24/18   Cherene Altes, MD  labetalol (NORMODYNE) 200 MG tablet Take 1 tablet (200 mg total) by mouth 3 (three) times daily. Patient not taking: Reported on 10/16/2018 06/24/18   Cherene Altes, MD    Family History Family History  Problem Relation Age of Onset  . Hyperlipidemia Mother   . Asthma Daughter   . Diabetes Maternal Grandmother   . Stroke Maternal Grandmother   . Kidney disease Maternal Grandfather   . Kidney disease Paternal Grandmother     Social History Social History   Tobacco Use  . Smoking status: Never Smoker  . Smokeless tobacco: Never Used  Substance Use Topics  . Alcohol use: Yes    Alcohol/week: 4.0 standard drinks    Types: 4 Standard drinks or equivalent per week    Comment: 02/02/2017 "nothing in the last 6 months; never had problem w/it"  . Drug use: No     Allergies   Patient has no known allergies.   Review of Systems Review of Systems  Constitutional: Negative for fever.  HENT: Negative for congestion.   Eyes: Negative for redness.  Respiratory: Positive for shortness of breath.   Cardiovascular: Negative for chest  pain.  Gastrointestinal: Positive for diarrhea and nausea. Negative for abdominal pain, constipation and vomiting.  Genitourinary: Negative for flank pain.  Musculoskeletal: Negative for myalgias.  Skin: Negative for rash.  Neurological: Negative for headaches.     Physical Exam Updated Vital Signs BP (!) 166/102   Pulse 73   Temp 98.9 F (37.2 C)   Resp 17   SpO2 98%   Physical Exam Vitals signs and nursing note reviewed.  Constitutional:      Appearance: She is not ill-appearing.  HENT:     Head: Normocephalic and atraumatic.     Mouth/Throat:     Mouth: Mucous membranes are dry.  Eyes:     Conjunctiva/sclera: Conjunctivae normal.  Neck:     Musculoskeletal: Neck supple.  Cardiovascular:     Rate and Rhythm: Normal rate and regular rhythm.     Pulses: Normal pulses.  Pulmonary:     Effort: Pulmonary effort is normal.     Breath sounds: Normal breath sounds. No wheezing, rhonchi or rales.  Abdominal:     Palpations: Abdomen is soft.     Tenderness: There is no abdominal tenderness. There is no guarding or rebound.  Musculoskeletal:     Right lower leg: No edema.     Left lower leg: No edema.  Skin:    General: Skin is warm and dry.  Neurological:     Mental Status: She is alert.      ED Treatments / Results  Labs (all labs ordered are listed, but only abnormal results are displayed) Labs Reviewed  BASIC METABOLIC PANEL - Abnormal; Notable for the following components:      Result Value   Sodium 123 (*)    Potassium >7.5 (*)    Chloride 83 (*)    CO2 21 (*)    Glucose, Bld 431 (*)    BUN 92 (*)    Creatinine, Ser 12.56 (*)    GFR calc non Af Amer 3 (*)    GFR calc Af Amer 4 (*)    Anion gap 19 (*)  All other components within normal limits  CBC - Abnormal; Notable for the following components:   RBC 3.08 (*)    Hemoglobin 9.5 (*)    HCT 29.8 (*)    RDW 17.4 (*)    All other components within normal limits  CBG MONITORING, ED - Abnormal;  Notable for the following components:   Glucose-Capillary 440 (*)    All other components within normal limits  I-STAT CHEM 8, ED - Abnormal; Notable for the following components:   Sodium 120 (*)    Potassium 7.9 (*)    Chloride 87 (*)    BUN 96 (*)    Creatinine, Ser 12.70 (*)    Glucose, Bld 414 (*)    Calcium, Ion 1.04 (*)    Hemoglobin 11.2 (*)    HCT 33.0 (*)    All other components within normal limits  SARS CORONAVIRUS 2 ([facility])  GASTROINTESTINAL PANEL BY PCR, STOOL (REPLACES STOOL CULTURE)  C DIFFICILE QUICK SCREEN W PCR REFLEX  URINALYSIS, ROUTINE W REFLEX MICROSCOPIC  BLOOD GAS, VENOUS  CBC  CREATININE, SERUM  I-STAT BETA HCG BLOOD, ED ([facility], [facility], [facility] ONLY)    EKG EKG Interpretation  Date/Time:  Wednesday October 16 2018 12:40:07 EDT Ventricular Rate:  149 PR Interval:  236 QRS Duration: 164 QT Interval:  252 QTC Calculation: 396 R Axis:   -33 Text Interpretation:  Sinus tachycardia with 1st degree A-V block with frequent Premature ventricular complexes in a pattern of bigeminy Possible Left atrial enlargement Left axis deviation Right bundle branch block Abnormal ECG peaked t waves and widened IVCD compared to previous Confirmed by Charlesetta Shanks [phone]) on 10/16/2018 1:51:45 PM   Radiology Dg Chest Port 1 View  Result Date: 10/16/2018 CLINICAL DATA:  Shortness of breath EXAM: PORTABLE CHEST 1 VIEW COMPARISON:  June 19, 2018 FINDINGS: The lung volumes low. The heart size is enlarged. There is volume overload without overt pulmonary edema. There is no pneumothorax. No acute osseous abnormality. IMPRESSION: Cardiomegaly with volume overload. Electronically Signed   By: Constance Holster M.D.   On: 10/16/2018 13:58    Procedures .Critical Care Performed by: Eustaquio Maize, PA-C Authorized by: Eustaquio Maize, PA-C   Critical care provider statement:    Critical care time (minutes):  40   Critical care was necessary  to treat or prevent imminent or life-threatening deterioration of the following conditions: hyperkalemia.   Critical care was time spent personally by me on the following activities:  Discussions with consultants, evaluation of patient's response to treatment, examination of patient, ordering and performing treatments and interventions, ordering and review of laboratory studies, ordering and review of radiographic studies, pulse oximetry, re-evaluation of patient's condition, obtaining history from patient or surrogate and review of old charts   (including critical care time)  Medications Ordered in ED Medications  Chlorhexidine Gluconate Cloth 2 % PADS 6 each (has no administration in time range)  sodium zirconium cyclosilicate (LOKELMA) packet 10 g (has no administration in time range)  insulin regular, human (MYXREDLIN) 100 units/ 100 mL infusion (has no administration in time range)  heparin injection 5,000 Units (has no administration in time range)  acetaminophen (TYLENOL) tablet 650 mg (has no administration in time range)    Or  acetaminophen (TYLENOL) suppository 650 mg (has no administration in time range)  oxyCODONE (Oxy IR/ROXICODONE) immediate release tablet 5 mg (has no administration in time range)  polyethylene glycol (MIRALAX / GLYCOLAX) packet 17 g (has  no administration in time range)  ondansetron (ZOFRAN) tablet 4 mg (has no administration in time range)    Or  ondansetron (ZOFRAN) injection 4 mg (has no administration in time range)  loperamide (IMODIUM) capsule 2 mg (has no administration in time range)  hydrALAZINE (APRESOLINE) injection 10 mg (has no administration in time range)  sodium chloride flush (NS) 0.9 % injection 3 mL (3 mLs Intravenous Given 10/16/18 1427)  calcium gluconate inj 10% (1 g) URGENT USE ONLY! (1 g Intravenous Given 10/16/18 1417)  albuterol (VENTOLIN HFA) 108 (90 Base) MCG/ACT inhaler 2 puff (2 puffs Inhalation Given 10/16/18 1359)  albuterol  (PROVENTIL) (2.5 MG/3ML) 0.083% nebulizer solution 10 mg (10 mg Nebulization Given 10/16/18 1421)  insulin aspart (novoLOG) injection 10 Units (10 Units Intravenous Given 10/16/18 1436)  sodium bicarbonate injection 50 mEq (50 mEq Intravenous Given 10/16/18 1425)  loperamide (IMODIUM) capsule 2 mg (2 mg Oral Given 10/16/18 1539)     Initial Impression / Assessment and Plan / ED Course  I have reviewed the triage vital signs and the nursing notes.  Pertinent labs & imaging results that were available during my care of the patient were reviewed by me and considered in my medical decision making (see chart for details).  38 year old female presenting to the ED today after missing 2 dialysis appointments; pt reports she has had diarrhea since Monday and did not go to dialysis. She also complains of nausea. Having shortness of breath currently. EKG with peaked t waves as well as bigeminy. Concern for hyperkalemia; waiting istat chem 8 prior to intervention. Given calcium gluconate and 2 puffs albuterol in the meantime. CXR ordered to rule out fluid overload at this time.   CXR negative at this time. Pt found to be severe hypokalemic at 7.9 and treated accordingly (see clinical course comments below). Pt to be admitted for emergent dialysis. Covid swab ordered.    Clinical Course as of Oct 15 1544  Wed Oct 16, 2018  1400 Unchanged from previous   Hemoglobin(!): 9.5 [MV]  1417 Potassium level > 7.5; pt treated with calcium gluconate, albuterol puffs, insulin, and sodium bicarb. Dr. Johnney Killian received call from nephrology regarding another patient and discussed this case as well.    [MV]  1430 Consult: Have reviewed to Dr. Justin Mend of nephrology.  He will be arranging for emergent dialysis.  Request admission to medical service.   [MP]    Clinical Course User Index [MP] Charlesetta Shanks, MD [MV] Eustaquio Maize, PA-C         Final Clinical Impressions(s) / ED Diagnoses   Final diagnoses:  ESRD (end  stage renal disease) ([place])  Acute hyperkalemia  Type 1 diabetes mellitus with hyperglycemia [facility])    ED Discharge Orders    None       Eustaquio Maize, PA-C 10/16/18 1547    Charlesetta Shanks, MD 10/17/18 [phone]

## 2018-10-17 DIAGNOSIS — E875 Hyperkalemia: Secondary | ICD-10-CM

## 2018-10-17 LAB — GLUCOSE, CAPILLARY
Glucose-Capillary: 254 mg/dL — ABNORMAL HIGH (ref 70–99)
Glucose-Capillary: 109 mg/dL — ABNORMAL HIGH (ref 70–99)
Glucose-Capillary: 106 mg/dL — ABNORMAL HIGH (ref 70–99)
Glucose-Capillary: 113 mg/dL — ABNORMAL HIGH (ref 70–99)
Glucose-Capillary: 158 mg/dL — ABNORMAL HIGH (ref 70–99)
Glucose-Capillary: 98 mg/dL (ref 70–99)

## 2018-10-17 LAB — CBC
HCT: 26 % — ABNORMAL LOW (ref 36.0–46.0)
Hemoglobin: 8.7 g/dL — ABNORMAL LOW (ref 12.0–15.0)
MCH: 31.1 pg (ref 26.0–34.0)
MCHC: 33.5 g/dL (ref 30.0–36.0)
MCV: 92.9 fL (ref 80.0–100.0)
Platelets: 156 10*3/uL (ref 150–400)
RBC: 2.8 MIL/uL — ABNORMAL LOW (ref 3.87–5.11)
RDW: 16.9 % — ABNORMAL HIGH (ref 11.5–15.5)
WBC: 6.6 10*3/uL (ref 4.0–10.5)
nRBC: 0 % (ref 0.0–0.2)

## 2018-10-17 LAB — BASIC METABOLIC PANEL
Anion gap: 14 (ref 5–15)
Anion gap: 16 — ABNORMAL HIGH (ref 5–15)
BUN: 39 mg/dL — ABNORMAL HIGH (ref 6–20)
BUN: 39 mg/dL — ABNORMAL HIGH (ref 6–20)
CO2: 27 mmol/L (ref 22–32)
CO2: 25 mmol/L (ref 22–32)
Calcium: 9.4 mg/dL (ref 8.9–10.3)
Calcium: 9.5 mg/dL (ref 8.9–10.3)
Chloride: 92 mmol/L — ABNORMAL LOW (ref 98–111)
Chloride: 92 mmol/L — ABNORMAL LOW (ref 98–111)
Creatinine, Ser: 7.33 mg/dL — ABNORMAL HIGH (ref 0.44–1.00)
Creatinine, Ser: 7.82 mg/dL — ABNORMAL HIGH (ref 0.44–1.00)
GFR calc Af Amer: 8 mL/min — ABNORMAL LOW (ref >60)
GFR calc Af Amer: 7 mL/min — ABNORMAL LOW (ref >60)
GFR calc non Af Amer: 7 mL/min — ABNORMAL LOW (ref >60)
GFR calc non Af Amer: 6 mL/min — ABNORMAL LOW (ref >60)
Glucose, Bld: 89 mg/dL (ref 70–99)
Glucose, Bld: 112 mg/dL — ABNORMAL HIGH (ref 70–99)
Potassium: 4.2 mmol/L (ref 3.5–5.1)
Potassium: 4.9 mmol/L (ref 3.5–5.1)
Sodium: 133 mmol/L — ABNORMAL LOW (ref 135–145)
Sodium: 133 mmol/L — ABNORMAL LOW (ref 135–145)

## 2018-10-17 LAB — MRSA PCR SCREENING: MRSA by PCR: NEGATIVE

## 2018-10-17 MED ORDER — TRAMADOL HCL 50 MG PO TABS: 100.0000 mg | ORAL_TABLET | Freq: Every day | ORAL | Status: DC | PRN

## 2018-10-17 MED ORDER — INSULIN ASPART PROT & ASPART (70-30 MIX) 100 UNIT/ML PEN: 15.0000 [IU] | PEN_INJECTOR | SUBCUTANEOUS | Status: DC

## 2018-10-17 MED ORDER — CARBAMAZEPINE 200 MG PO TABS: 200.0000 mg | ORAL_TABLET | Freq: Every day | ORAL | Status: DC

## 2018-10-17 MED ORDER — INSULIN ASPART PROT & ASPART (70-30 MIX) 100 UNIT/ML ~~LOC~~ SUSP: 15.0000 [IU] | Freq: Every day | SUBCUTANEOUS | Status: DC

## 2018-10-17 MED ORDER — METOCLOPRAMIDE HCL 10 MG PO TABS
10.0000 mg | ORAL_TABLET | Freq: Three times a day (TID) | ORAL | Status: DC
  Administered 2018-10-17 (×2): 10 mg via ORAL
  Filled 2018-10-17 (×2): qty 1

## 2018-10-17 MED ORDER — CLONIDINE HCL 0.2 MG PO TABS
0.2000 mg | ORAL_TABLET | Freq: Three times a day (TID) | ORAL | Status: DC
  Administered 2018-10-17: 0.2 mg via ORAL
  Filled 2018-10-17: qty 1

## 2018-10-17 MED ORDER — INSULIN ASPART 100 UNIT/ML ~~LOC~~ SOLN: 3.0000 [IU] | Freq: Three times a day (TID) | SUBCUTANEOUS | Status: DC

## 2018-10-17 MED ORDER — MINOXIDIL 2.5 MG PO TABS
2.5000 mg | ORAL_TABLET | Freq: Two times a day (BID) | ORAL | Status: DC
  Administered 2018-10-17: 2.5 mg via ORAL
  Filled 2018-10-17 (×2): qty 1

## 2018-10-17 MED ORDER — GABAPENTIN 300 MG PO CAPS
300.0000 mg | ORAL_CAPSULE | Freq: Two times a day (BID) | ORAL | Status: DC
  Administered 2018-10-17 (×2): 300 mg via ORAL
  Filled 2018-10-17: qty 1

## 2018-10-17 MED ORDER — GABAPENTIN 300 MG PO CAPS
600.0000 mg | ORAL_CAPSULE | Freq: Every day | ORAL | Status: DC
  Filled 2018-10-17: qty 2

## 2018-10-17 MED ORDER — LABETALOL HCL 200 MG PO TABS: 200.0000 mg | ORAL_TABLET | Freq: Three times a day (TID) | ORAL | Status: DC

## 2018-10-17 MED ORDER — INSULIN ASPART PROT & ASPART (70-30 MIX) 100 UNIT/ML ~~LOC~~ SUSP
12.0000 [IU] | Freq: Two times a day (BID) | SUBCUTANEOUS | Status: DC
  Administered 2018-10-17: 12 [IU] via SUBCUTANEOUS

## 2018-10-17 MED ORDER — FERRIC CITRATE 1 GM 210 MG(FE) PO TABS
420.0000 mg | ORAL_TABLET | Freq: Three times a day (TID) | ORAL | Status: DC
  Administered 2018-10-17: 420 mg via ORAL
  Filled 2018-10-17 (×2): qty 2

## 2018-10-17 MED ORDER — FREESTYLE LIBRE READER DEVI: 1.0000 | Freq: Every day | Status: DC

## 2018-10-17 MED ORDER — AMLODIPINE BESYLATE 10 MG PO TABS: 10.0000 mg | ORAL_TABLET | Freq: Every day | ORAL | Status: DC

## 2018-10-17 MED ORDER — INSULIN ASPART 100 UNIT/ML ~~LOC~~ SOLN
0.0000 [IU] | Freq: Three times a day (TID) | SUBCUTANEOUS | Status: DC
  Administered 2018-10-17: 09:00:00 3 [IU] via SUBCUTANEOUS

## 2018-10-17 MED ORDER — FREESTYLE LIBRE 14 DAY SENSOR MISC: 14.0000 | Freq: Every day | Status: DC

## 2018-10-17 MED ORDER — INSULIN ASPART PROT & ASPART (70-30 MIX) 100 UNIT/ML ~~LOC~~ SUSP: 15.0000 [IU] | SUBCUTANEOUS | Status: DC

## 2018-10-17 MED ORDER — INSULIN ASPART PROT & ASPART (70-30 MIX) 100 UNIT/ML ~~LOC~~ SUSP
25.0000 [IU] | SUBCUTANEOUS | Status: DC
  Filled 2018-10-17: qty 10

## 2018-10-17 MED ORDER — LABETALOL HCL 200 MG PO TABS
300.0000 mg | ORAL_TABLET | Freq: Three times a day (TID) | ORAL | Status: DC
  Administered 2018-10-17: 300 mg via ORAL
  Filled 2018-10-17: qty 2

## 2018-10-17 NOTE — Discharge Summary (Signed)
Physician Discharge Summary  Carrie [name] IZT:[id number] DOB: Oct 23, 1980 DOA: 10/16/2018  PCP: Glendon Axe, MD  Admit date: 10/16/2018 Discharge date: 10/17/2018  Time spent: 35 minutes  Recommendations for Outpatient Follow-up:  1. Needs outpatient reinitiation of dialysis 2. 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)   Gastroparesis   ESRD on hemodialysis (HCC)   Hypertensive urgency   Diarrhea   Volume overload state of heart   DKA, type 1, not at goal [facility])   Discharge Condition: Improved  Diet recommendation: Diabetic salt control 1200 cc fluid  Filed Weights   10/16/18 1742 10/16/18 2054 10/17/18 0418  Weight: 89.9 kg 85.9 kg 85.1 kg    History of present illness:  38 year old type I diabetic since age 52 ESRD MWF [place] since 2017-no visual issues Diabetic neuropathy Migraine headaches Known diabetic gastroparesis Came to emergency room after several days of diarrhea-patient works 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 exposed to other people is not clear if she had sick contacts-started having profuse diarrhea Note that she is on Aldactone was pretty hyperkalemic on admission potassium 7.9 which was treated with Lokelma calcium gluconate sodium bicarb Had emergent dialysis-acidosis would not explain bilateral alone needed insulin drip overnight as she also seemed to have Kussmaul breathing She was much improved the next day on 7/2 she was able to tolerate a diet and did not have any issues with keeping down food-in retrospect she might of had probably severe DKA and should be careful with her insulin regimen  I discussed with Dr. Justin Mend of nephrology today who thinks she is stable for discharge and can follow-up with her usual dialysis center   Consultations:  Nephrology  Discharge Exam: Vitals:   10/17/18 0900 10/17/18 1103  BP: (!) 192/96   Pulse: 89    Resp:    Temp: 98 F (36.7 C) 98.2 F (36.8 C)  SpO2:      General: Awake alert pleasant no distress Cardiovascular: S1-S2 no murmur rub or gallop Respiratory: Clinically clear no added sound Abdomen soft no rebound no guarding  Discharge Instructions   Discharge Instructions    Diet - low sodium heart healthy   Complete by: As directed    Discharge instructions   Complete by: As directed    Stop aldactone, Lisinopril Follow up with your primary MD Follow a low carb diet   Increase activity slowly   Complete by: As directed      Allergies as of 10/17/2018   No Known Allergies     Medication List    STOP taking these medications   Ashwagandha 500 MG Caps   cloNIDine 0.2 MG tablet Commonly known as: CATAPRES   labetalol 200 MG tablet Commonly known as: NORMODYNE   labetalol 300 MG tablet Commonly known as: NORMODYNE   lisinopril 40 MG tablet Commonly known as: ZESTRIL   spironolactone 25 MG tablet Commonly known as: ALDACTONE     TAKE these medications   amLODipine 10 MG tablet Commonly known as: NORVASC Take 10 mg by mouth at bedtime.   carbamazepine 200 MG tablet Commonly known as: TEGRETOL Take 1 tablet (200 mg total) by mouth at bedtime.   cloNIDine 0.3 mg/24hr patch Commonly known as: CATAPRES - Dosed in mg/24 hr Place 0.3 mg onto the skin once a week. Not using yet   ferric citrate 1 GM 210 MG(Fe) tablet Commonly known  as: AURYXIA Take 420 mg by mouth 3 (three) times daily with meals.   FreeStyle Libre 14 Day Sensor Misc 14 Devices by Does not apply route daily.   FreeStyle Libre Reader Devi 1 Device by Does not apply route daily.   gabapentin 300 MG capsule Commonly known as: NEURONTIN Take 300-600 mg by mouth See admin instructions. Taking 1 capsule (300mg ) in the Am, and 1 capsule at lunch and 2 Capsules (600mg ) at bedtime.   insulin aspart protamine - aspart (70-30) 100 UNIT/ML FlexPen Commonly known as: NovoLOG Mix 70/30  FlexPen Inject 0.15-0.25 mLs (15-25 Units total) into the skin See admin instructions. On non-dialysis days inj 25 units in the morning and 15 units at night. On dialysis days inj 15 units in the morning and 15 units at night. What changed:   how much to take  when to take this  additional instructions   metoCLOPramide 10 MG tablet Commonly known as: Reglan Take 1 tablet (10 mg total) by mouth 3 (three) times daily before meals.   minoxidil 2.5 MG tablet Commonly known as: LONITEN Take 2.5 mg by mouth 2 (two) times daily.   promethazine 25 MG tablet Commonly known as: PHENERGAN Take 25 mg by mouth every 6 (six) hours as needed.   traMADol 100 MG 24 hr tablet Commonly known as: ULTRAM-ER Take 100 mg by mouth daily as needed for moderate pain.      No Known Allergies    The results of significant diagnostics from this hospitalization (including imaging, microbiology, ancillary and laboratory) are listed below for reference.    Significant Diagnostic Studies: Dg Chest Port 1 View  Result Date: 10/16/2018 CLINICAL DATA:  Shortness of breath EXAM: PORTABLE CHEST 1 VIEW COMPARISON:  June 19, 2018 FINDINGS: The lung volumes low. The heart size is enlarged. There is volume overload without overt pulmonary edema. There is no pneumothorax. No acute osseous abnormality. IMPRESSION: Cardiomegaly with volume overload. Electronically Signed   By: Constance Holster M.D.   On: 10/16/2018 13:58    Microbiology: Recent Results (from the past 240 hour(s))  SARS Coronavirus 2 (CEPHEID - Performed in [place] hospital lab), Hosp Order     Status: None   Collection Time: 10/16/18  3:16 PM   Specimen: Nasopharyngeal Swab  Result Value Ref Range Status   SARS Coronavirus 2 NEGATIVE NEGATIVE Final    Comment: (NOTE) If result is NEGATIVE SARS-CoV-2 target nucleic acids are NOT DETECTED. The SARS-CoV-2 RNA is generally detectable in upper and lower  respiratory specimens during the acute  phase of infection. The lowest  concentration of SARS-CoV-2 viral copies this assay can detect is 250  copies / mL. A negative result does not preclude SARS-CoV-2 infection  and should not be used as the sole basis for treatment or other  patient management decisions.  A negative result may occur with  improper specimen collection / handling, submission of specimen other  than nasopharyngeal swab, presence of viral mutation(s) within the  areas targeted by this assay, and inadequate number of viral copies  (<250 copies / mL). A negative result must be combined with clinical  observations, patient history, and epidemiological information. If result is POSITIVE SARS-CoV-2 target nucleic acids are DETECTED. The SARS-CoV-2 RNA is generally detectable in upper and lower  respiratory specimens dur ing the acute phase of infection.  Positive  results are indicative of active infection with SARS-CoV-2.  Clinical  correlation with patient history and other diagnostic information is  necessary to  determine patient infection status.  Positive results do  not rule out bacterial infection or co-infection with other viruses. If result is PRESUMPTIVE POSTIVE SARS-CoV-2 nucleic acids MAY BE PRESENT.   A presumptive positive result was obtained on the submitted specimen  and confirmed on repeat testing.  While 2019 novel coronavirus  (SARS-CoV-2) nucleic acids may be present in the submitted sample  additional confirmatory testing may be necessary for epidemiological  and / or clinical management purposes  to differentiate between  SARS-CoV-2 and other Sarbecovirus currently known to infect humans.  If clinically indicated additional testing with an alternate test  methodology [phone]) is advised. The SARS-CoV-2 RNA is generally  detectable in upper and lower respiratory sp ecimens during the acute  phase of infection. The expected result is Negative. Fact Sheet for Patients:   StrictlyIdeas.no Fact Sheet for Healthcare Providers: BankingDealers.co.za This test is not yet approved or cleared by the [place] FDA and has been authorized for detection and/or diagnosis of SARS-CoV-2 by FDA under an Emergency Use Authorization (EUA).  This EUA will remain in effect (meaning this test can be used) for the duration of the COVID-19 declaration under Section 564(b)(1) of the Act, 21 U.S.C. section 360bbb-3(b)(1), unless the authorization is terminated or revoked sooner. Performed at [facility], [place] [address], [place], [place] [address]   MRSA PCR Screening     Status: None   Collection Time: 10/17/18  6:25 AM   Specimen: Nasopharyngeal  Result Value Ref Range Status   MRSA by PCR NEGATIVE NEGATIVE Final    Comment:        The GeneXpert MRSA Assay (FDA approved for NASAL specimens only), is one component of a comprehensive MRSA colonization surveillance program. It is not intended to diagnose MRSA infection nor to guide or monitor treatment for MRSA infections. Performed at [facility], [place] [address] [place], [place] [address]      Labs: Basic Metabolic Panel: Recent Labs  Lab 10/16/18 1240 10/16/18 1415 10/16/18 2321 10/17/18 0258  NA 123* 120* 133* 133*  K >7.5* 7.9* 4.2 4.9  CL 83* 87* 92* 92*  CO2 21*  --  27 25  GLUCOSE 431* 414* 89 112*  BUN 92* 96* 39* 39*  CREATININE 12.56* 12.70* 7.33* 7.82*  CALCIUM 9.3  --  9.4 9.5   Liver Function Tests: No results for input(s): AST, ALT, ALKPHOS, BILITOT, PROT, ALBUMIN in the last 168 hours. No results for input(s): LIPASE, AMYLASE in the last 168 hours. No results for input(s): AMMONIA in the last 168 hours. CBC: Recent Labs  Lab 10/16/18 1240 10/16/18 1415 10/17/18 0258  WBC 6.4  --  6.6  HGB 9.5* 11.2* 8.7*  HCT 29.8* 33.0* 26.0*  MCV 96.8  --  92.9  PLT 178  --  156   Cardiac Enzymes: No results for input(s):  CKTOTAL, CKMB, CKMBINDEX, TROPONINI in the last 168 hours. BNP: BNP (last 3 results) No results for input(s): BNP in the last 8760 hours.  ProBNP (last 3 results) No results for input(s): PROBNP in the last 8760 hours.  CBG: Recent Labs  Lab 10/17/18 0103 10/17/18 0208 10/17/18 0319 10/17/18 0753 10/17/18 1141  GLUCAP 109* 106* 113* 158* 98       Signed:  Nita Sells MD   [facility] 10/17/2018, 2:52 PM

## 2018-10-17 NOTE — Plan of Care (Signed)

## 2018-10-17 NOTE — Progress Notes (Signed)
Inpatient Diabetes Program Recommendations  AACE/ADA: New Consensus Statement on Inpatient Glycemic Control (2015)  Target Ranges:  Prepandial:   less than 140 mg/dL      Peak postprandial:   less than 180 mg/dL (1-2 hours)      Critically ill patients:  140 - 180 mg/dL   Lab Results  Component Value Date   GLUCAP 158 (H) 10/17/2018   HGBA1C 9.7 (H) 06/21/2018    Review of Glycemic Control  Diabetes history: DM 1 Outpatient Diabetes medications: 70/30 20 units tid Current orders for Inpatient glycemic control: 70/30 12 units bid, Novolog 0-15 units tid, Novolog 3 units tid meal coverage  Inpatient Diabetes Program Recommendations:    Consider d/cing Novolog 3 units tid meal coverage. Meal coverage not recommended in addition to 70/30 dose as the 30 portion of the insulin acts like meal coverage.  Thanks, Tama Headings RN, MSN, BC-ADM Inpatient Diabetes Coordinator Team Pager [phone] (8a-5p)

## 2019-03-17 ENCOUNTER — Other Ambulatory Visit: Payer: Self-pay

## 2019-03-17 ENCOUNTER — Inpatient Hospital Stay (HOSPITAL_COMMUNITY)
Admission: EM | Admit: 2019-03-17 | Discharge: 2019-03-20 | DRG: 637 | Disposition: A | Discharge status: Home / Self Care | Payer: Medicare Other | Attending: Family Medicine | Admitting: Family Medicine

## 2019-03-17 ENCOUNTER — Emergency Department (HOSPITAL_COMMUNITY): Payer: Medicare Other

## 2019-03-17 ENCOUNTER — Encounter (HOSPITAL_COMMUNITY): Payer: Self-pay | Admitting: Emergency Medicine

## 2019-03-17 DIAGNOSIS — Z833 Family history of diabetes mellitus: Secondary | ICD-10-CM

## 2019-03-17 DIAGNOSIS — E10319 Type 1 diabetes mellitus with unspecified diabetic retinopathy without macular edema: Secondary | ICD-10-CM | POA: Diagnosis present

## 2019-03-17 DIAGNOSIS — E1100 Type 2 diabetes mellitus with hyperosmolarity without nonketotic hyperglycemic-hyperosmolar coma (NKHHC): Secondary | ICD-10-CM | POA: Diagnosis not present

## 2019-03-17 DIAGNOSIS — E871 Hypo-osmolality and hyponatremia: Secondary | ICD-10-CM | POA: Diagnosis not present

## 2019-03-17 DIAGNOSIS — K3184 Gastroparesis: Secondary | ICD-10-CM | POA: Diagnosis present

## 2019-03-17 DIAGNOSIS — Z9119 Patient's noncompliance with other medical treatment and regimen: Secondary | ICD-10-CM

## 2019-03-17 DIAGNOSIS — E1043 Type 1 diabetes mellitus with diabetic autonomic (poly)neuropathy: Secondary | ICD-10-CM | POA: Diagnosis present

## 2019-03-17 DIAGNOSIS — Z823 Family history of stroke: Secondary | ICD-10-CM

## 2019-03-17 DIAGNOSIS — I160 Hypertensive urgency: Secondary | ICD-10-CM | POA: Diagnosis present

## 2019-03-17 DIAGNOSIS — N2581 Secondary hyperparathyroidism of renal origin: Secondary | ICD-10-CM | POA: Diagnosis present

## 2019-03-17 DIAGNOSIS — E1165 Type 2 diabetes mellitus with hyperglycemia: Secondary | ICD-10-CM | POA: Diagnosis not present

## 2019-03-17 DIAGNOSIS — Z20828 Contact with and (suspected) exposure to other viral communicable diseases: Secondary | ICD-10-CM | POA: Diagnosis present

## 2019-03-17 DIAGNOSIS — Z7682 Awaiting organ transplant status: Secondary | ICD-10-CM

## 2019-03-17 DIAGNOSIS — I120 Hypertensive chronic kidney disease with stage 5 chronic kidney disease or end stage renal disease: Secondary | ICD-10-CM | POA: Diagnosis present

## 2019-03-17 DIAGNOSIS — E119 Type 2 diabetes mellitus without complications: Secondary | ICD-10-CM

## 2019-03-17 DIAGNOSIS — E1022 Type 1 diabetes mellitus with diabetic chronic kidney disease: Secondary | ICD-10-CM | POA: Diagnosis present

## 2019-03-17 DIAGNOSIS — Z825 Family history of asthma and other chronic lower respiratory diseases: Secondary | ICD-10-CM

## 2019-03-17 DIAGNOSIS — G43909 Migraine, unspecified, not intractable, without status migrainosus: Secondary | ICD-10-CM | POA: Diagnosis present

## 2019-03-17 DIAGNOSIS — D631 Anemia in chronic kidney disease: Secondary | ICD-10-CM | POA: Diagnosis present

## 2019-03-17 DIAGNOSIS — Z794 Long term (current) use of insulin: Secondary | ICD-10-CM

## 2019-03-17 DIAGNOSIS — I7389 Other specified peripheral vascular diseases: Secondary | ICD-10-CM

## 2019-03-17 DIAGNOSIS — E1065 Type 1 diabetes mellitus with hyperglycemia: Secondary | ICD-10-CM | POA: Diagnosis not present

## 2019-03-17 DIAGNOSIS — E8770 Fluid overload, unspecified: Secondary | ICD-10-CM | POA: Diagnosis present

## 2019-03-17 DIAGNOSIS — N186 End stage renal disease: Secondary | ICD-10-CM | POA: Diagnosis present

## 2019-03-17 DIAGNOSIS — Z8349 Family history of other endocrine, nutritional and metabolic diseases: Secondary | ICD-10-CM

## 2019-03-17 DIAGNOSIS — Z992 Dependence on renal dialysis: Secondary | ICD-10-CM

## 2019-03-17 DIAGNOSIS — R05 Cough: Secondary | ICD-10-CM | POA: Diagnosis not present

## 2019-03-17 LAB — COMPREHENSIVE METABOLIC PANEL
ALT: 28 U/L (ref 0–44)
AST: 23 U/L (ref 15–41)
Albumin: 3.8 g/dL (ref 3.5–5.0)
Alkaline Phosphatase: 121 U/L (ref 38–126)
Anion gap: 20 — ABNORMAL HIGH (ref 5–15)
BUN: 39 mg/dL — ABNORMAL HIGH (ref 6–20)
CO2: 22 mmol/L (ref 22–32)
Calcium: 9.3 mg/dL (ref 8.9–10.3)
Chloride: 75 mmol/L — ABNORMAL LOW (ref 98–111)
Creatinine, Ser: 9.51 mg/dL — ABNORMAL HIGH (ref 0.44–1.00)
GFR calc Af Amer: 6 mL/min — ABNORMAL LOW (ref >60)
GFR calc non Af Amer: 5 mL/min — ABNORMAL LOW (ref >60)
Glucose, Bld: 1215 mg/dL (ref 70–99)
Potassium: 5.5 mmol/L — ABNORMAL HIGH (ref 3.5–5.1)
Sodium: 117 mmol/L — CL (ref 135–145)
Total Bilirubin: 1.2 mg/dL (ref 0.3–1.2)
Total Protein: 7 g/dL (ref 6.5–8.1)

## 2019-03-17 LAB — CBC WITH DIFFERENTIAL/PLATELET
Abs Immature Granulocytes: 0.09 10*3/uL — ABNORMAL HIGH (ref 0.00–0.07)
Basophils Absolute: 0.1 10*3/uL (ref 0.0–0.1)
Basophils Relative: 1 %
Eosinophils Absolute: 0.1 10*3/uL (ref 0.0–0.5)
Eosinophils Relative: 1 %
HCT: 31 % — ABNORMAL LOW (ref 36.0–46.0)
Hemoglobin: 9.8 g/dL — ABNORMAL LOW (ref 12.0–15.0)
Immature Granulocytes: 2 %
Lymphocytes Relative: 8 %
Lymphs Abs: 0.5 10*3/uL — ABNORMAL LOW (ref 0.7–4.0)
MCH: 32.1 pg (ref 26.0–34.0)
MCHC: 31.6 g/dL (ref 30.0–36.0)
MCV: 101.6 fL — ABNORMAL HIGH (ref 80.0–100.0)
Monocytes Absolute: 0.3 10*3/uL (ref 0.1–1.0)
Monocytes Relative: 5 %
Neutro Abs: 5.2 10*3/uL (ref 1.7–7.7)
Neutrophils Relative %: 83 %
Platelets: 187 10*3/uL (ref 150–400)
RBC: 3.05 MIL/uL — ABNORMAL LOW (ref 3.87–5.11)
RDW: 16.8 % — ABNORMAL HIGH (ref 11.5–15.5)
WBC: 6.2 10*3/uL (ref 4.0–10.5)
nRBC: 0 % (ref 0.0–0.2)

## 2019-03-17 LAB — I-STAT CHEM 8, ED
BUN: 46 mg/dL — ABNORMAL HIGH (ref 6–20)
Calcium, Ion: 1.1 mmol/L — ABNORMAL LOW (ref 1.15–1.40)
Chloride: 77 mmol/L — ABNORMAL LOW (ref 98–111)
Creatinine, Ser: 9.2 mg/dL — ABNORMAL HIGH (ref 0.44–1.00)
Glucose, Bld: >700 mg/dL (ref 70–99)
HCT: 36 % (ref 36.0–46.0)
Hemoglobin: 12.2 g/dL (ref 12.0–15.0)
Potassium: 5.4 mmol/L — ABNORMAL HIGH (ref 3.5–5.1)
Sodium: 115 mmol/L — CL (ref 135–145)
TCO2: 27 mmol/L (ref 22–32)

## 2019-03-17 LAB — POCT I-STAT EG7
Acid-Base Excess: 1 mmol/L (ref 0.0–2.0)
Bicarbonate: 26.6 mmol/L (ref 20.0–28.0)
Calcium, Ion: 1.05 mmol/L — ABNORMAL LOW (ref 1.15–1.40)
HCT: 34 % — ABNORMAL LOW (ref 36.0–46.0)
Hemoglobin: 11.6 g/dL — ABNORMAL LOW (ref 12.0–15.0)
O2 Saturation: 100 %
Potassium: 5.4 mmol/L — ABNORMAL HIGH (ref 3.5–5.1)
Sodium: 114 mmol/L — CL (ref 135–145)
TCO2: 28 mmol/L (ref 22–32)
pCO2, Ven: 46.5 mmHg (ref 44.0–60.0)
pH, Ven: 7.365 (ref 7.250–7.430)
pO2, Ven: 177 mmHg — ABNORMAL HIGH (ref 32.0–45.0)

## 2019-03-17 LAB — CBG MONITORING, ED
Glucose-Capillary: 591 mg/dL (ref 70–99)
Glucose-Capillary: >600 mg/dL (ref 70–99)
Glucose-Capillary: >600 mg/dL (ref 70–99)
Glucose-Capillary: >600 mg/dL (ref 70–99)
Glucose-Capillary: >600 mg/dL (ref 70–99)
Glucose-Capillary: >600 mg/dL (ref 70–99)

## 2019-03-17 LAB — LIPASE, BLOOD: Lipase: 30 U/L (ref 11–51)

## 2019-03-17 LAB — SARS CORONAVIRUS 2 (TAT 6-24 HRS): SARS Coronavirus 2: NEGATIVE

## 2019-03-17 LAB — I-STAT BETA HCG BLOOD, ED (MC, WL, AP ONLY): I-stat hCG, quantitative: <5 m[IU]/mL (ref <5)

## 2019-03-17 LAB — POC SARS CORONAVIRUS 2 AG -  ED: SARS Coronavirus 2 Ag: NEGATIVE

## 2019-03-17 MED ORDER — LORAZEPAM 2 MG/ML IJ SOLN
1.0000 mg | Freq: Once | INTRAMUSCULAR | Status: AC
  Administered 2019-03-17: 1 mg via INTRAVENOUS
  Filled 2019-03-17: qty 1

## 2019-03-17 MED ORDER — AMLODIPINE BESYLATE 5 MG PO TABS
10.0000 mg | ORAL_TABLET | Freq: Once | ORAL | Status: AC
  Administered 2019-03-17: 15:00:00 10 mg via ORAL
  Filled 2019-03-17: qty 2

## 2019-03-17 MED ORDER — INSULIN REGULAR(HUMAN) IN NACL 100-0.9 UT/100ML-% IV SOLN: INTRAVENOUS | Status: DC

## 2019-03-17 MED ORDER — DEXTROSE 50 % IV SOLN: 0.0000 mL | INTRAVENOUS | Status: DC | PRN

## 2019-03-17 MED ORDER — SODIUM CHLORIDE 0.9 % IV BOLUS
1000.0000 mL | Freq: Once | INTRAVENOUS | Status: AC
  Administered 2019-03-17: 1000 mL via INTRAVENOUS

## 2019-03-17 MED ORDER — DEXTROSE 50 % IV SOLN
0.0000 mL | INTRAVENOUS | Status: DC | PRN
  Administered 2019-03-20: 50 mL via INTRAVENOUS
  Filled 2019-03-17: qty 50

## 2019-03-17 MED ORDER — DEXTROSE-NACL 5-0.45 % IV SOLN: INTRAVENOUS | Status: DC

## 2019-03-17 MED ORDER — DEXTROSE-NACL 5-0.45 % IV SOLN
INTRAVENOUS | Status: DC
  Administered 2019-03-18: 05:00:00 via INTRAVENOUS

## 2019-03-17 MED ORDER — SODIUM CHLORIDE 0.9 % IV SOLN: INTRAVENOUS | Status: DC

## 2019-03-17 MED ORDER — HEPARIN SODIUM (PORCINE) 5000 UNIT/ML IJ SOLN
5000.0000 [IU] | Freq: Three times a day (TID) | INTRAMUSCULAR | Status: DC
  Administered 2019-03-18: 5000 [IU] via SUBCUTANEOUS
  Filled 2019-03-17 (×4): qty 1

## 2019-03-17 MED ORDER — CLONIDINE HCL 0.1 MG PO TABS
0.1000 mg | ORAL_TABLET | Freq: Once | ORAL | Status: AC
  Administered 2019-03-17: 0.1 mg via ORAL
  Filled 2019-03-17: qty 1

## 2019-03-17 MED ORDER — SODIUM CHLORIDE 0.9 % IV SOLN
INTRAVENOUS | Status: DC
  Administered 2019-03-18: 01:00:00 1 mL via INTRAVENOUS

## 2019-03-17 MED ORDER — HYDRALAZINE HCL 20 MG/ML IJ SOLN: 5.0000 mg | INTRAMUSCULAR | Status: DC | PRN

## 2019-03-17 MED ORDER — INSULIN REGULAR(HUMAN) IN NACL 100-0.9 UT/100ML-% IV SOLN
INTRAVENOUS | Status: DC
  Administered 2019-03-17: 5 [IU]/h via INTRAVENOUS
  Filled 2019-03-17: qty 100

## 2019-03-17 MED ORDER — HYDRALAZINE HCL 20 MG/ML IJ SOLN
10.0000 mg | Freq: Once | INTRAMUSCULAR | Status: AC
  Administered 2019-03-17: 10 mg via INTRAVENOUS
  Filled 2019-03-17: qty 1

## 2019-03-17 MED ORDER — INSULIN ASPART 100 UNIT/ML ~~LOC~~ SOLN
10.0000 [IU] | Freq: Once | SUBCUTANEOUS | Status: AC
  Administered 2019-03-17: 10 [IU] via SUBCUTANEOUS

## 2019-03-17 MED ORDER — ONDANSETRON HCL 4 MG/2ML IJ SOLN: 4.0000 mg | Freq: Once | INTRAMUSCULAR | Status: DC

## 2019-03-17 MED ORDER — ONDANSETRON 4 MG PO TBDP
4.0000 mg | ORAL_TABLET | Freq: Once | ORAL | Status: AC
  Administered 2019-03-17: 4 mg via ORAL
  Filled 2019-03-17: qty 1

## 2019-03-17 MED ORDER — METOCLOPRAMIDE HCL 5 MG/ML IJ SOLN: 5.0000 mg | Freq: Four times a day (QID) | INTRAMUSCULAR | Status: DC

## 2019-03-17 MED ORDER — HYDRALAZINE HCL 20 MG/ML IJ SOLN: 10.0000 mg | Freq: Once | INTRAMUSCULAR | Status: DC

## 2019-03-17 NOTE — ED Notes (Addendum)
Several attempts to get an iv, another was placed by the resident in the right forearm and the vein blew as soon as it was flushed/.

## 2019-03-17 NOTE — ED Notes (Signed)
Pt received an IV in the upper right arm, when I tried to flush the IV before admin of medications the flush would not budge. The catheter and the pig tail are intact. The PA was consulted and physician notified.

## 2019-03-17 NOTE — H&P (Addendum)
History and Physical    Carrie [name] [id number] DOB: 12/07/1980 DOA: 03/17/2019  PCP: Glendon Axe, MD Patient coming from: Home  Chief Complaint: Sore throat  HPI: Carrie [name] is a 38 y.o. female with medical history significant of ESRD on HD MWF, hypertension, poorly controlled type 1 diabetes with multiple prior hospitalizations for DKA, neuropathy, gastroparesis, anemia presenting with a chief complaint of sore throat.  History very limited as patient is currently very somnolent after receiving Ativan in the ED for anxiety.  Reports having a sore throat and cough.  She is not able to tell me when the symptoms started.  Not able to tell me if she is taking her diabetes and blood pressure medications.  No additional history could be obtained from her.  ED Course: Blood pressure significantly elevated with systolic up to [id number] and diastolic up to 0000000.  No leukocytosis.  Hemoglobin 9.8, at baseline.  Labs indicated above pseudohyponatremia with sodium 117 (when corrected for hyperglycemia, 135).  Blood glucose 1215.  Bicarb 22, anion gap 20.  Blood gas with pH 7.36.  Potassium 5.5, chloride 75.  BUN 39, creatinine 9.5.  Baseline creatinine in the 7 range.  Lipase and LFTs normal.  Beta-hCG negative.  SARS-CoV-2 rapid antigen and PCR test both negative.  Chest x-ray showing cardiomegaly without edema or consolidation.  Patient received amlodipine 10 mg, clonidine 0.1 mg, NovoLog 10 units, and Zofran 4 mg.  Insulin infusion cannot be started as there was difficulty obtaining IV access despite multiple attempts in the ED. IV access now finally been established an IV insulin started.  IV fluid bolus ordered.  Review of Systems:  All systems reviewed and apart from history of presenting illness, are negative.  Past Medical History:  Diagnosis Date  . Anemia   . Diabetic neuropathy ([place])   . Diabetic retinopathy ([place])   . ESRD (end stage renal disease) on dialysis [facility])    "MWF; [place]"  (02/02/2017)  . Gastroparesis   . HA (headache)    "qd if I didn't take my RX" (02/02/2017)  . Hemodialysis access site with arteriovenous graft ([place])   . Hypertension   . Neuropathy   . Renal insufficiency   . Trigeminal neuralgia   . Type I diabetes mellitus ([place])     Past Surgical History:  Procedure Laterality Date  . AV FISTULA PLACEMENT Left 09/07/2016   Procedure: Left arm 1st Stage Basilic AV Fistula;  Surgeon: [name], MD;  Location: [facility] OR;  Service: Vascular;  Laterality: Left;  . DILATION AND CURETTAGE OF UTERUS    . EXCISIONAL HEMORRHOIDECTOMY    . EYE SURGERY    . INSERTION OF DIALYSIS CATHETER Right 09/07/2016   Procedure: INSERTION OF DIALYSIS CATHETER;  Surgeon: [name], MD;  Location: [place];  Service: Vascular;  Laterality: Right;  . IR FLUORO GUIDE CV LINE LEFT  06/20/2018  . IR US GUIDE VASC ACCESS LEFT  06/20/2018  . RETINAL LASER PROCEDURE Bilateral    "related to diabetes"  . REVISON OF ARTERIOVENOUS FISTULA Left 01/15/2017   Procedure: INSERTION OF  AV GORE-TEX GRAFT;  Surgeon: Elam Dutch, MD;  Location: [place];  Service: Vascular;  Laterality: Left;  . TEE WITHOUT CARDIOVERSION N/A 01/11/2017   Procedure: TRANSESOPHAGEAL ECHOCARDIOGRAM (TEE);  Surgeon: Jerline Pain, MD;  Location[facility] ENDOSCOPY;  Service: Cardiovascular;  Laterality: N/A;     reports that she has never smoked. She has never used smokeless tobacco. She reports current  alcohol use of about 4.0 standard drinks of alcohol per week. She reports that she does not use drugs.  No Known Allergies  Family History  Problem Relation Age of Onset  . Hyperlipidemia Mother   . Asthma Daughter   . Diabetes Maternal Grandmother   . Stroke Maternal Grandmother   . Kidney disease Maternal Grandfather   . Kidney disease Paternal Grandmother     Prior to Admission medications   Medication Sig Start Date End Date Taking? Authorizing Provider  amLODipine (NORVASC) 10 MG tablet Take 10  mg by mouth at bedtime. 10/01/17   [provider]  carbamazepine (TEGRETOL) 200 MG tablet Take 1 tablet (200 mg total) by mouth at bedtime. 05/07/17   Annita Brod, MD  cloNIDine (CATAPRES - DOSED IN MG/24 HR) 0.3 mg/24hr patch Place 0.3 mg onto the skin once a week. Not using yet 08/26/18   [provider]  Continuous Blood Gluc Receiver (FREESTYLE LIBRE READER) DEVI 1 Device by Does not apply route daily. 06/24/18   Cherene Altes, MD  Continuous Blood Gluc Sensor (FREESTYLE LIBRE 14 DAY SENSOR) MISC 14 Devices by Does not apply route daily. 06/24/18   Cherene Altes, MD  ferric citrate (AURYXIA) 1 GM 210 MG(Fe) tablet Take 420 mg by mouth 3 (three) times daily with meals.     [provider]  gabapentin (NEURONTIN) 300 MG capsule Take 300-600 mg by mouth See admin instructions. Taking 1 capsule (300mg ) in the Am, and 1 capsule at lunch and 2 Capsules (600mg ) at bedtime. 07/23/18   [provider]  insulin aspart protamine - aspart (NOVOLOG MIX 70/30 FLEXPEN) (70-30) 100 UNIT/ML FlexPen Inject 0.15-0.25 mLs (15-25 Units total) into the skin See admin instructions. On non-dialysis days inj 25 units in the morning and 15 units at night. On dialysis days inj 15 units in the morning and 15 units at night. Patient taking differently: Inject 20 Units into the skin 3 (three) times daily with meals.  10/10/17   Hongalgi, Lenis Dickinson, MD  metoCLOPramide (REGLAN) 10 MG tablet Take 1 tablet (10 mg total) by mouth 3 (three) times daily before meals. 05/28/18   Ward, Delice Bison, DO  minoxidil (LONITEN) 2.5 MG tablet Take 2.5 mg by mouth 2 (two) times daily. 09/04/18   [provider]  promethazine (PHENERGAN) 25 MG tablet Take 25 mg by mouth every 6 (six) hours as needed. 09/27/18   [provider]  traMADol (ULTRAM-ER) 100 MG 24 hr tablet Take 100 mg by mouth daily as needed for moderate pain. 05/21/18   [provider]    Physical Exam: Vitals:    03/17/19 1939 03/17/19 1950 03/17/19 2000 03/17/19 2030  BP:  (!) 188/99 (!) 196/109 (!) 177/91  Pulse: 87 86 84 81  Resp: 16 18 (!) 21 (!) 23  Temp:      TempSrc:      SpO2: 97% 100% 95% (!) 84%    Physical Exam  Constitutional: She appears well-developed and well-nourished. No distress.  HENT:  Head: Normocephalic.  Eyes: Right eye exhibits no discharge. Left eye exhibits no discharge.  Neck: Neck supple.  Cardiovascular: Normal rate, regular rhythm and intact distal pulses.  Pulmonary/Chest: Breath sounds normal. No respiratory distress. She has no wheezes. She has no rales.  Abdominal: Soft. Bowel sounds are normal. She exhibits no distension. There is no abdominal tenderness. There is no guarding.  Musculoskeletal:        General: No edema.  Neurological:  Very somnolent Opening eyes intermittently to answer questions and follow commands  Skin: Skin is warm and dry. She is not diaphoretic.     Labs on Admission: I have personally reviewed following labs and imaging studies  CBC: Recent Labs  Lab 03/17/19 1417 03/17/19 1511 03/17/19 1530  WBC 6.2  --   --   NEUTROABS 5.2  --   --   HGB 9.8* 12.2 11.6*  HCT 31.0* 36.0 34.0*  MCV 101.6*  --   --   PLT 187  --   --    Basic Metabolic Panel: Recent Labs  Lab 03/17/19 1417 03/17/19 1511 03/17/19 1530  NA 117* 115* 114*  K 5.5* 5.4* 5.4*  CL 75* 77*  --   CO2 22  --   --   GLUCOSE 1,215* >700*  --   BUN 39* 46*  --   CREATININE 9.51* 9.20*  --   CALCIUM 9.3  --   --    GFR: CrCl cannot be calculated (Unknown ideal weight.). Liver Function Tests: Recent Labs  Lab 03/17/19 1417  AST 23  ALT 28  ALKPHOS 121  BILITOT 1.2  PROT 7.0  ALBUMIN 3.8   Recent Labs  Lab 03/17/19 1417  LIPASE 30   No results for input(s): AMMONIA in the last 168 hours. Coagulation Profile: No results for input(s): INR, PROTIME in the last 168 hours. Cardiac Enzymes: No results for input(s): CKTOTAL, CKMB, CKMBINDEX,  TROPONINI in the last 168 hours. BNP (last 3 results) No results for input(s): PROBNP in the last 8760 hours. HbA1C: No results for input(s): HGBA1C in the last 72 hours. CBG: Recent Labs  Lab 03/17/19 1825 03/17/19 2041 03/17/19 2116 03/17/19 2155 03/17/19 2339  GLUCAP >600* >600* >600* >600* 591*   Lipid Profile: No results for input(s): CHOL, HDL, LDLCALC, TRIG, CHOLHDL, LDLDIRECT in the last 72 hours. Thyroid Function Tests: No results for input(s): TSH, T4TOTAL, FREET4, T3FREE, THYROIDAB in the last 72 hours. Anemia Panel: No results for input(s): VITAMINB12, FOLATE, FERRITIN, TIBC, IRON, RETICCTPCT in the last 72 hours. Urine analysis:    Component Value Date/Time   COLORURINE YELLOW 10/08/2017 1700   APPEARANCEUR HAZY (A) 10/08/2017 1700   LABSPEC 1.009 10/08/2017 1700   PHURINE 8.0 10/08/2017 1700   GLUCOSEU >=500 (A) 10/08/2017 1700   HGBUR SMALL (A) 10/08/2017 1700   HGBUR large 05/25/2009 0900   BILIRUBINUR NEGATIVE 10/08/2017 1700   BILIRUBINUR neg 10/23/2012 1810   KETONESUR NEGATIVE 10/08/2017 1700   PROTEINUR >=300 (A) 10/08/2017 1700   UROBILINOGEN 0.2 04/16/2013 2155   NITRITE NEGATIVE 10/08/2017 1700   LEUKOCYTESUR LARGE (A) 10/08/2017 1700    Radiological Exams on Admission: Dg Chest Port 1 View  Result Date: 03/17/2019 CLINICAL DATA:  Cough.  Chronic renal failure EXAM: PORTABLE CHEST 1 VIEW COMPARISON:  October 16, 2018. FINDINGS: There is no edema or consolidation. There is cardiomegaly with pulmonary vascularity normal. No adenopathy. No bone lesions. IMPRESSION: Cardiomegaly.  No edema or consolidation. Electronically Signed   By: Lowella Grip III M.D.   On: 03/17/2019 14:32    EKG: Independently reviewed. Sinus rhythm, slight ST depressions in inferior and lateral leads.  Prior EKG from July 2020 with baseline wander in inferior leads making comparison challenging.  Assessment/Plan Principal Problem:   Hyperosmolar hyperglycemic state (HHS)  ([place]) Active Problems:   Diabetes ([place])   ESRD (end stage renal disease) ([place])   Hypertensive urgency   HHS, poorly controlled type 1 diabetes Patient with a  history of poorly controlled type 1 diabetes and multiple prior hospitalizations for DKA.  Last A1c in March 2020 was 9.7.  Labs today indicative of HHS.  Blood glucose 1215.  Bicarb 22, anion gap 20.  Blood gas with pH 7.36.  Potassium 5.5.  Patient currently very somnolent after receiving Ativan in the ED for anxiety. -Insulin infusion -BMP every 4 hours -CBG checks every 1 hour -Received a total of 400 cc IV fluid in the ED.  Be cautious giving IV fluids given patient has end-stage renal disease on hemodialysis. Will continue very gentle IV fluid hydration with normal saline at 50 cc/h.  Switch to D5-1 half normal saline at 50 cc/h when CBG less than 250. -Initiate subcutaneous insulin and diet after resolution of hyperglycemia.  Continue IV insulin for an additional 1 to 2 hours. -Discussed with nephrology.  Planning on scheduling hemodialysis in the morning. -Beta hydroxybutyrate  Hypertensive urgency, history of uncontrolled hypertension Blood pressure significantly elevated with systolic up to [id number] and diastolic up to 0000000.  Possibly related to hemodialysis and home antihypertensive medication noncompliance.  Chest x-ray not suggestive of aortic dissection. EKG showing slight ST depressions in inferior and lateral leads.  Prior EKG from July 2020 with baseline wander in inferior leads making comparison challenging.  -Nephrology planning on scheduling dialysis in the morning -Received p.o. amlodipine and clonidine in the ED.  Blood pressure now improved with systolic in the [id number]. -IV hydralazine as needed for SBP >160 -Cardiac monitoring -Check high-sensitivity troponin level  ESRD on HD BUN 39, creatinine 9.5.  Baseline creatinine in the 7 range.  Labs suggestive of significant hyperglycemia/HHS.  Also appears volume overloaded  with significantly elevated blood pressure on arrival. -Nephrology planning on scheduling hemodialysis in the morning.  History of diabetic gastroparesis -IV Reglan every 6 hours  Pharmacy med rec pending.  DVT prophylaxis: Subcutaneous heparin Code Status: Full code Family Communication: No family available. Disposition Plan: Anticipate discharge after clinical improvement. Consults called: Nephrology (Dr. Joelyn Oms) Admission status: It is my clinical opinion that referral for OBSERVATION is reasonable and necessary in this patient based on the above information provided. The aforementioned taken together are felt to place the patient at high risk for further clinical deterioration. However it is anticipated that the patient may be medically stable for discharge from the hospital within 24 to 48 hours.  The medical decision making on this patient was of high complexity and the patient is at high risk for clinical deterioration, therefore this is a level 3 visit.  Shela Leff MD [facility] Pager [phone]  If 7PM-7AM, please contact night-coverage [email or web address] Password [email or web address]  03/17/2019, 11:44 PM

## 2019-03-17 NOTE — ED Notes (Signed)
MD looking over ordersets for insulin dosing. Current orderset is incorrect. MD advised to keep insulin drip at 26ml/hr until orderset is correct.

## 2019-03-17 NOTE — ED Triage Notes (Signed)
Pt states she has had a cough for 3 weeks. Pt was told to come have covid test.

## 2019-03-17 NOTE — ED Provider Notes (Addendum)
[facility] DEPARTMENT Provider Note   CSN: [id number] Arrival date & time: 03/17/19  1240     History   Chief Complaint Chief Complaint  Patient presents with  . Cough    HPI Carrie [name] is a 38 y.o. female with PMHx ESRD on dialysis MWF, Type 1 diabetes, HTN, gastroparesis, who presents to the ED today for continued nonproductive cough x 3 weeks.  She reports that she was sent here from dialysis today for Covid test.  States that they would not dialyze her as they are concerned she could have Covid, no known COVID-19 positive exposure.  Patient states that last week when she was coughing at dialysis they gave her a Z-Pak but it did not resolve her symptoms.  Patient denies fever, chills, shortness of breath, sore throat, ear pain, sinus pressure, rhinorrhea, nasal congestion, any other associated symptoms.   Patient is also complaining of nausea, nonbloody nonbilious emesis, diarrhea and diffuse abdominal pain for the past week.  She reports she has a history of gastroparesis and states this feels similar.  Patient states that she typically takes Reglan but has not been helping her symptoms.  She states that she has not mentioned this to her GI doctors.  Patient states that she has been taking her insulin sometimes.  States that last time she checked her blood sugar was 5 days ago and states it read "high."        Past Medical History:  Diagnosis Date  . Anemia   . Diabetic neuropathy ([place])   . Diabetic retinopathy ([place])   . ESRD (end stage renal disease) on dialysis [facility])    "MWF; [place]" (02/02/2017)  . Gastroparesis   . HA (headache)    "qd if I didn't take my RX" (02/02/2017)  . Hemodialysis access site with arteriovenous graft ([place])   . Hypertension   . Neuropathy   . Renal insufficiency   . Trigeminal neuralgia   . Type I diabetes mellitus [facility])     Patient Active Problem List   Diagnosis Date Noted  . Hyperosmolar hyperglycemic state  (HHS) ([place]) 03/17/2019  . Hyperkalemia 10/16/2018  . Diarrhea 10/16/2018  . Volume overload state of heart 10/16/2018  . DKA, type 1, not at goal [facility]) 10/16/2018  . Acute cystitis without hematuria 10/09/2017  . Hypertensive urgency 10/09/2017  . ESRD (end stage renal disease) ([place]) 02/01/2017  . 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 Pregnancy test positive 12/22/2016  . Amenorrhea 12/12/2016  . Diabetes ([place]) 09/16/2016  . ESRD on hemodialysis ([place])   . Gastroparesis   . Intractable vomiting 09/04/2016  . CKD stage 5 due to type 1 diabetes mellitus ([place]) 09/04/2016  . Intractable vomiting with nausea 04/04/2015  . AKI (acute kidney injury) ([place]) 04/04/2015  . Trigeminal neuralgia 12/12/2013  . HA (headache)   . DKA, type 2 ([place]) 10/24/2012  . Contact dermatitis 10/24/2012  . SORE THROAT 03/22/2010  . 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 FATIGUE 05/25/2009  . VAGINAL DISCHARGE 01/28/2009  . Carbuncle and furuncle of unspecified site 12/29/2008  . HYPERCHOLESTEROLEMIA 06/12/2008  . MICROALBUMINURIA 06/12/2008  . MONILIAL VAGINITIS 11/14/2007  . INGUINAL PAIN, BILATERAL 11/14/2007  . DYSURIA 10/17/2007  . CYSTITIS 09/05/2007  . DELAYED MENSES 09/05/2007  . RHINOCONJUNCTIVITIS, ALLERGIC 07/19/2007  . DENTAL PAIN 03/28/2007  . HYPERLIPIDEMIA 09/01/2006  . PELVIC INFLAMMATORY DISEASE 09/01/2006  . ABORTION, SPONTANEOUS 09/01/2006  . ABSCESS 09/01/2006  . HX, PERSONAL, PAST NONCOMPLIANCE 09/01/2006    Past  Surgical History:  Procedure Laterality Date  . AV FISTULA PLACEMENT Left 09/07/2016   Procedure: Left arm 1st Stage Basilic AV Fistula;  Surgeon: [name], MD;  Location: [facility] OR;  Service: Vascular;  Laterality: Left;  . DILATION AND CURETTAGE OF UTERUS    . EXCISIONAL HEMORRHOIDECTOMY    . EYE SURGERY    . INSERTION OF DIALYSIS CATHETER Right 09/07/2016   Procedure: INSERTION OF DIALYSIS CATHETER;  Surgeon: [name], MD;  Location: [facility];  Service: Vascular;   Laterality: Right;  . IR FLUORO GUIDE CV LINE LEFT  06/20/2018  . IR US GUIDE VASC ACCESS LEFT  06/20/2018  . RETINAL LASER PROCEDURE Bilateral    "related to diabetes"  . REVISON OF ARTERIOVENOUS FISTULA Left 01/15/2017   Procedure: INSERTION OF  AV GORE-TEX GRAFT;  Surgeon: Elam Dutch, MD;  Location: [facility];  Service: Vascular;  Laterality: Left;  . TEE WITHOUT CARDIOVERSION N/A 01/11/2017   Procedure: TRANSESOPHAGEAL ECHOCARDIOGRAM (TEE);  Surgeon: Jerline Pain, MD;  Location: [facility] ENDOSCOPY;  Service: Cardiovascular;  Laterality: N/A;     OB History    Gravida  7   Para  1   Term      Preterm  1   AB  5   Living  1     SAB  2   TAB  3   Ectopic      Multiple      Live Births               Home Medications    Prior to Admission medications   Medication Sig Start Date End Date Taking? Authorizing Provider  amLODipine (NORVASC) 10 MG tablet Take 10 mg by mouth at bedtime. 10/01/17   [provider]  carbamazepine (TEGRETOL) 200 MG tablet Take 1 tablet (200 mg total) by mouth at bedtime. 05/07/17   Annita Brod, MD  cloNIDine (CATAPRES - DOSED IN MG/24 HR) 0.3 mg/24hr patch Place 0.3 mg onto the skin once a week. Not using yet 08/26/18   [provider]  Continuous Blood Gluc Receiver (FREESTYLE LIBRE READER) DEVI 1 Device by Does not apply route daily. 06/24/18   Cherene Altes, MD  Continuous Blood Gluc Sensor (FREESTYLE LIBRE 14 DAY SENSOR) MISC 14 Devices by Does not apply route daily. 06/24/18   Cherene Altes, MD  ferric citrate (AURYXIA) 1 GM 210 MG(Fe) tablet Take 420 mg by mouth 3 (three) times daily with meals.     [provider]  gabapentin (NEURONTIN) 300 MG capsule Take 300-600 mg by mouth See admin instructions. Taking 1 capsule (300mg ) in the Am, and 1 capsule at lunch and 2 Capsules (600mg ) at bedtime. 07/23/18   [provider]  insulin aspart protamine - aspart (NOVOLOG MIX 70/30 FLEXPEN) (70-30) 100  UNIT/ML FlexPen Inject 0.15-0.25 mLs (15-25 Units total) into the skin See admin instructions. On non-dialysis days inj 25 units in the morning and 15 units at night. On dialysis days inj 15 units in the morning and 15 units at night. Patient taking differently: Inject 20 Units into the skin 3 (three) times daily with meals.  10/10/17   Hongalgi, Lenis Dickinson, MD  metoCLOPramide (REGLAN) 10 MG tablet Take 1 tablet (10 mg total) by mouth 3 (three) times daily before meals. 05/28/18   Ward, Delice Bison, DO  minoxidil (LONITEN) 2.5 MG tablet Take 2.5 mg by mouth 2 (two) times daily. 09/04/18   [provider]  promethazine (  PHENERGAN) 25 MG tablet Take 25 mg by mouth every 6 (six) hours as needed. 09/27/18   [provider]  traMADol (ULTRAM-ER) 100 MG 24 hr tablet Take 100 mg by mouth daily as needed for moderate pain. 05/21/18   [provider]    Family History Family History  Problem Relation Age of Onset  . Hyperlipidemia Mother   . Asthma Daughter   . Diabetes Maternal Grandmother   . Stroke Maternal Grandmother   . Kidney disease Maternal Grandfather   . Kidney disease Paternal Grandmother     Social History Social History   Tobacco Use  . Smoking status: Never Smoker  . Smokeless tobacco: Never Used  Substance Use Topics  . Alcohol use: Yes    Alcohol/week: 4.0 standard drinks    Types: 4 Standard drinks or equivalent per week    Comment: 02/02/2017 "nothing in the last 6 months; never had problem w/it"  . Drug use: No     Allergies   Patient has no known allergies.   Review of Systems Review of Systems  Constitutional: Negative for chills and fever.  HENT: Negative for congestion, ear pain and sore throat.   Eyes: Negative for visual disturbance.  Respiratory: Positive for cough. Negative for shortness of breath.   Gastrointestinal: Positive for abdominal pain, diarrhea, nausea and vomiting.  Genitourinary: Negative for flank pain.  Musculoskeletal:  Negative for myalgias.  Skin: Negative for rash.  Neurological: Negative for headaches.     Physical Exam Updated Vital Signs BP (!) 193/93 (BP Location: Right Arm)   Pulse 83   Temp 99 F (37.2 C) (Oral)   Resp 16   SpO2 95%   Physical Exam Vitals signs and nursing note reviewed.  Constitutional:      Comments: Actively coughing; appears fatigued  HENT:     Head: Normocephalic and atraumatic.     Mouth/Throat:     Mouth: Mucous membranes are dry.  Eyes:     Conjunctiva/sclera: Conjunctivae normal.  Neck:     Musculoskeletal: Neck supple.  Cardiovascular:     Rate and Rhythm: Normal rate and regular rhythm.     Pulses: Normal pulses.  Pulmonary:     Effort: Pulmonary effort is normal.     Breath sounds: Normal breath sounds. No wheezing, rhonchi or rales.  Abdominal:     Palpations: Abdomen is soft.     Tenderness: There is no abdominal tenderness. There is no guarding or rebound.  Skin:    General: Skin is warm and dry.  Neurological:     Mental Status: She is alert.      ED Treatments / Results  Labs (all labs ordered are listed, but only abnormal results are displayed) Labs Reviewed  CBC WITH DIFFERENTIAL/PLATELET - Abnormal; Notable for the following components:      Result Value   RBC 3.05 (*)    Hemoglobin 9.8 (*)    HCT 31.0 (*)    MCV 101.6 (*)    RDW 16.8 (*)    Lymphs Abs 0.5 (*)    Abs Immature Granulocytes 0.09 (*)    All other components within normal limits  COMPREHENSIVE METABOLIC PANEL - Abnormal; Notable for the following components:   Sodium 117 (*)    Potassium 5.5 (*)    Chloride 75 (*)    Glucose, Bld 1,215 (*)    BUN 39 (*)    Creatinine, Ser 9.51 (*)    GFR calc non Af Amer 5 (*)  GFR calc Af Amer 6 (*)    Anion gap 20 (*)    All other components within normal limits  I-STAT CHEM 8, ED - Abnormal; Notable for the following components:   Sodium 115 (*)    Potassium 5.4 (*)    Chloride 77 (*)    BUN 46 (*)    Creatinine,  Ser 9.20 (*)    Glucose, Bld >700 (*)    Calcium, Ion 1.10 (*)    All other components within normal limits  CBG MONITORING, ED - Abnormal; Notable for the following components:   Glucose-Capillary >600 (*)    All other components within normal limits  POCT I-STAT EG7 - Abnormal; Notable for the following components:   pO2, Ven 177.0 (*)    Sodium 114 (*)    Potassium 5.4 (*)    Calcium, Ion 1.05 (*)    HCT 34.0 (*)    Hemoglobin 11.6 (*)    All other components within normal limits  CBG MONITORING, ED - Abnormal; Notable for the following components:   Glucose-Capillary >600 (*)    All other components within normal limits  CBG MONITORING, ED - Abnormal; Notable for the following components:   Glucose-Capillary >600 (*)    All other components within normal limits  CBG MONITORING, ED - Abnormal; Notable for the following components:   Glucose-Capillary >600 (*)    All other components within normal limits  SARS CORONAVIRUS 2 (TAT 6-24 HRS)  LIPASE, BLOOD  BETA-HYDROXYBUTYRIC ACID  POC SARS CORONAVIRUS 2 AG -  ED  I-STAT BETA HCG BLOOD, ED ([facility], [facility], [facility] ONLY)  I-STAT VENOUS BLOOD GAS, ED    EKG None  Radiology Dg Chest Port 1 View  Result Date: 03/17/2019 CLINICAL DATA:  Cough.  Chronic renal failure EXAM: PORTABLE CHEST 1 VIEW COMPARISON:  October 16, 2018. FINDINGS: There is no edema or consolidation. There is cardiomegaly with pulmonary vascularity normal. No adenopathy. No bone lesions. IMPRESSION: Cardiomegaly.  No edema or consolidation. Electronically Signed   By: Lowella Grip III M.D.   On: 03/17/2019 14:32    Procedures .Critical Care Performed by: Eustaquio Maize, PA-C Authorized by: Eustaquio Maize, PA-C   Critical care provider statement:    Critical care time (minutes):  60   Critical care was necessary to treat or prevent imminent or life-threatening deterioration of the following conditions:  Endocrine crisis   Critical care was time spent personally by  me on the following activities:  Discussions with consultants, evaluation of patient's response to treatment, examination of patient, ordering and performing treatments and interventions, ordering and review of laboratory studies, ordering and review of radiographic studies, pulse oximetry, re-evaluation of patient's condition, obtaining history from patient or surrogate and review of old charts   (including critical care time)  Medications Ordered in ED Medications  ondansetron (ZOFRAN) injection 4 mg (has no administration in time range)  insulin regular, human (MYXREDLIN) 100 units/ 100 mL infusion (5 Units/hr Intravenous New Bag/Given 03/17/19 2047)  0.9 %  sodium chloride infusion (has no administration in time range)  dextrose 5 %-0.45 % sodium chloride infusion (has no administration in time range)  dextrose 50 % solution 0-50 mL (has no administration in time range)  hydrALAZINE (APRESOLINE) injection 10 mg (has no administration in time range)  sodium chloride 0.9 % bolus 1,000 mL (1,000 mLs Intravenous New Bag/Given 03/17/19 2044)  amLODipine (NORVASC) tablet 10 mg (10 mg Oral Given 03/17/19 1502)  ondansetron (ZOFRAN-ODT) disintegrating tablet 4 mg (  4 mg Oral Given 03/17/19 1600)  insulin aspart (novoLOG) injection 10 Units (10 Units Subcutaneous Given 03/17/19 1635)  cloNIDine (CATAPRES) tablet 0.1 mg (0.1 mg Oral Given 03/17/19 1728)  LORazepam (ATIVAN) injection 1 mg (1 mg Intravenous Given 03/17/19 2025)     Initial Impression / Assessment and Plan / ED Course  I have reviewed the triage vital signs and the nursing notes.  Pertinent labs & imaging results that were available during my care of the patient were reviewed by me and considered in my medical decision making (see chart for details).  Clinical Course as of Mar 17 2147  [facility] Mar 17, 2019  1548 Discussed case with nephrologist Dr. Baird Cancer who will plan for dialysis tomorrow   [MV]  2140 IV access successful.  Discussed case with Dr. Marlowe Sax who agrees to admit patient.    [MV]    Clinical Course User Index [MV] Eustaquio Maize, [place]   38 year old female who presents initially for covid testing - reports dialysis sent her here as she has been actively coughing for 3 weeks.  Patient has not had dialysis since Friday.  Also complains of nausea, vomiting, diarrhea with diffuse abdominal pain.  History of gastroparesis.  States that she has not been taking her insulin as prescribed.  The last time she checked her blood sugar was 5 days ago.  Vital signs today includes temperature of 99.  Patient nontachycardic and nontachypneic.  Her blood pressure is elevated at 208/98.  Patient states he has been taking her blood pressure medication but is unsure if she is keeping them down.  Will swab for Covid at this time.  Obtain chest x-ray given cough as well as last dialysis 3 days ago.  There is concern for potential DKA at this point given patient has not been taking her insulin as prescribed has been having nausea, vomiting and ability to keep anything down.  Will give hydralazine for blood pressure control.    Unfortunately patient has issues with IV access.  IV access team attempted to come but were unsuccessful.  Have changed IV hydralazine to patient's home dose of Norvasc.   CBG greater than 600.  I-STAT Chem-8 with glucose greater than 700.  Potassium 5.4.  Sodium 115; will await CMP to correct sodium with hyperglycemia.  Creatinine 9.20 today.  It appears patients plan is around 7.0.  Will consult nephrology although patient will need to be admitted for hyperglycemia at this time.   CMP with gap of 20 and glucose 1215. Bicarb 22. Corrected sodium of 135.   VBG with normal pH.  It appears patient is in HHS and not DKA.  Will start on glucose stabilizer at this time.   Continues to have issues with IV access.  Resident Burns Spain, MD has attempted IV access without success. Dr. Sherry Ruffing attending physician to  attempt as well. Have given patient subcue insulin to hold her over until IV access can be achieved. Unfortunately home dose of norvasc has not controlled blood pressure; will give Clonidine PO  IV access obtained after multiple attempts. Pt currently receiving fluids and insulin. Her blood pressure has continued to decrease with fluids; will give IV hydralazine as well. Will re consult hospitalist at this time.   Dr. Marlowe Sax to admit; Dr. Baird Cancer with nephrology following.   This note was prepared using Dragon voice recognition software and may include unintentional dictation errors due to the inherent limitations of voice recognition software.  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

## 2019-03-17 NOTE — ED Notes (Addendum)
IV attempted 3 times for IV w/o success. Attending resident attempted to get an iv as well and the IV infiltrated. MD notified and will attempt.

## 2019-03-18 DIAGNOSIS — Z794 Long term (current) use of insulin: Secondary | ICD-10-CM | POA: Diagnosis not present

## 2019-03-18 DIAGNOSIS — I7389 Other specified peripheral vascular diseases: Secondary | ICD-10-CM

## 2019-03-18 DIAGNOSIS — I160 Hypertensive urgency: Secondary | ICD-10-CM | POA: Diagnosis present

## 2019-03-18 DIAGNOSIS — K3184 Gastroparesis: Secondary | ICD-10-CM | POA: Diagnosis present

## 2019-03-18 DIAGNOSIS — Z8349 Family history of other endocrine, nutritional and metabolic diseases: Secondary | ICD-10-CM | POA: Diagnosis not present

## 2019-03-18 DIAGNOSIS — Z20828 Contact with and (suspected) exposure to other viral communicable diseases: Secondary | ICD-10-CM | POA: Diagnosis present

## 2019-03-18 DIAGNOSIS — G43909 Migraine, unspecified, not intractable, without status migrainosus: Secondary | ICD-10-CM | POA: Diagnosis present

## 2019-03-18 DIAGNOSIS — E1043 Type 1 diabetes mellitus with diabetic autonomic (poly)neuropathy: Secondary | ICD-10-CM | POA: Diagnosis present

## 2019-03-18 DIAGNOSIS — N2581 Secondary hyperparathyroidism of renal origin: Secondary | ICD-10-CM | POA: Diagnosis present

## 2019-03-18 DIAGNOSIS — E8770 Fluid overload, unspecified: Secondary | ICD-10-CM | POA: Diagnosis present

## 2019-03-18 DIAGNOSIS — R05 Cough: Secondary | ICD-10-CM | POA: Diagnosis present

## 2019-03-18 DIAGNOSIS — I120 Hypertensive chronic kidney disease with stage 5 chronic kidney disease or end stage renal disease: Secondary | ICD-10-CM | POA: Diagnosis present

## 2019-03-18 DIAGNOSIS — Z825 Family history of asthma and other chronic lower respiratory diseases: Secondary | ICD-10-CM | POA: Diagnosis not present

## 2019-03-18 DIAGNOSIS — N186 End stage renal disease: Secondary | ICD-10-CM

## 2019-03-18 DIAGNOSIS — Z992 Dependence on renal dialysis: Secondary | ICD-10-CM | POA: Diagnosis not present

## 2019-03-18 DIAGNOSIS — D631 Anemia in chronic kidney disease: Secondary | ICD-10-CM | POA: Diagnosis present

## 2019-03-18 DIAGNOSIS — Z823 Family history of stroke: Secondary | ICD-10-CM | POA: Diagnosis not present

## 2019-03-18 DIAGNOSIS — Z833 Family history of diabetes mellitus: Secondary | ICD-10-CM | POA: Diagnosis not present

## 2019-03-18 DIAGNOSIS — Z7682 Awaiting organ transplant status: Secondary | ICD-10-CM | POA: Diagnosis not present

## 2019-03-18 DIAGNOSIS — E871 Hypo-osmolality and hyponatremia: Secondary | ICD-10-CM | POA: Diagnosis not present

## 2019-03-18 DIAGNOSIS — E1165 Type 2 diabetes mellitus with hyperglycemia: Secondary | ICD-10-CM | POA: Diagnosis not present

## 2019-03-18 DIAGNOSIS — E1065 Type 1 diabetes mellitus with hyperglycemia: Secondary | ICD-10-CM | POA: Diagnosis present

## 2019-03-18 DIAGNOSIS — E1022 Type 1 diabetes mellitus with diabetic chronic kidney disease: Secondary | ICD-10-CM | POA: Diagnosis present

## 2019-03-18 DIAGNOSIS — E1100 Type 2 diabetes mellitus with hyperosmolarity without nonketotic hyperglycemic-hyperosmolar coma (NKHHC): Secondary | ICD-10-CM | POA: Diagnosis not present

## 2019-03-18 DIAGNOSIS — E10319 Type 1 diabetes mellitus with unspecified diabetic retinopathy without macular edema: Secondary | ICD-10-CM | POA: Diagnosis present

## 2019-03-18 DIAGNOSIS — Z9119 Patient's noncompliance with other medical treatment and regimen: Secondary | ICD-10-CM | POA: Diagnosis not present

## 2019-03-18 HISTORY — DX: Other specified peripheral vascular diseases: I73.89

## 2019-03-18 LAB — BASIC METABOLIC PANEL
Anion gap: 20 — ABNORMAL HIGH (ref 5–15)
Anion gap: 20 — ABNORMAL HIGH (ref 5–15)
BUN: 41 mg/dL — ABNORMAL HIGH (ref 6–20)
BUN: 44 mg/dL — ABNORMAL HIGH (ref 6–20)
CO2: 21 mmol/L — ABNORMAL LOW (ref 22–32)
CO2: 23 mmol/L (ref 22–32)
Calcium: 9.7 mg/dL (ref 8.9–10.3)
Calcium: 9.6 mg/dL (ref 8.9–10.3)
Chloride: 83 mmol/L — ABNORMAL LOW (ref 98–111)
Chloride: 84 mmol/L — ABNORMAL LOW (ref 98–111)
Creatinine, Ser: 9.79 mg/dL — ABNORMAL HIGH (ref 0.44–1.00)
Creatinine, Ser: 10.53 mg/dL — ABNORMAL HIGH (ref 0.44–1.00)
GFR calc Af Amer: 5 mL/min — ABNORMAL LOW (ref >60)
GFR calc Af Amer: 5 mL/min — ABNORMAL LOW (ref >60)
GFR calc non Af Amer: 5 mL/min — ABNORMAL LOW (ref >60)
GFR calc non Af Amer: 4 mL/min — ABNORMAL LOW (ref >60)
Glucose, Bld: 567 mg/dL (ref 70–99)
Glucose, Bld: 154 mg/dL — ABNORMAL HIGH (ref 70–99)
Potassium: 4 mmol/L (ref 3.5–5.1)
Potassium: 3.6 mmol/L (ref 3.5–5.1)
Sodium: 124 mmol/L — ABNORMAL LOW (ref 135–145)
Sodium: 127 mmol/L — ABNORMAL LOW (ref 135–145)

## 2019-03-18 LAB — GLUCOSE, CAPILLARY
Glucose-Capillary: 212 mg/dL — ABNORMAL HIGH (ref 70–99)
Glucose-Capillary: 203 mg/dL — ABNORMAL HIGH (ref 70–99)
Glucose-Capillary: 154 mg/dL — ABNORMAL HIGH (ref 70–99)
Glucose-Capillary: 132 mg/dL — ABNORMAL HIGH (ref 70–99)
Glucose-Capillary: 138 mg/dL — ABNORMAL HIGH (ref 70–99)
Glucose-Capillary: 161 mg/dL — ABNORMAL HIGH (ref 70–99)
Glucose-Capillary: 152 mg/dL — ABNORMAL HIGH (ref 70–99)
Glucose-Capillary: 160 mg/dL — ABNORMAL HIGH (ref 70–99)

## 2019-03-18 LAB — CBG MONITORING, ED
Glucose-Capillary: 158 mg/dL — ABNORMAL HIGH (ref 70–99)
Glucose-Capillary: 161 mg/dL — ABNORMAL HIGH (ref 70–99)
Glucose-Capillary: 179 mg/dL — ABNORMAL HIGH (ref 70–99)
Glucose-Capillary: 198 mg/dL — ABNORMAL HIGH (ref 70–99)
Glucose-Capillary: 232 mg/dL — ABNORMAL HIGH (ref 70–99)
Glucose-Capillary: 258 mg/dL — ABNORMAL HIGH (ref 70–99)
Glucose-Capillary: 271 mg/dL — ABNORMAL HIGH (ref 70–99)
Glucose-Capillary: 327 mg/dL — ABNORMAL HIGH (ref 70–99)
Glucose-Capillary: 423 mg/dL — ABNORMAL HIGH (ref 70–99)
Glucose-Capillary: 427 mg/dL — ABNORMAL HIGH (ref 70–99)
Glucose-Capillary: 461 mg/dL — ABNORMAL HIGH (ref 70–99)
Glucose-Capillary: 555 mg/dL (ref 70–99)

## 2019-03-18 LAB — TROPONIN I (HIGH SENSITIVITY)
Troponin I (High Sensitivity): 47 ng/L — ABNORMAL HIGH (ref <18)
Troponin I (High Sensitivity): 60 ng/L — ABNORMAL HIGH (ref <18)

## 2019-03-18 LAB — HEMOGLOBIN A1C
Hgb A1c MFr Bld: 11 % — ABNORMAL HIGH (ref 4.8–5.6)
Mean Plasma Glucose: 269 mg/dL

## 2019-03-18 LAB — OSMOLALITY: Osmolality: 301 mOsm/kg — ABNORMAL HIGH (ref 275–295)

## 2019-03-18 LAB — BETA-HYDROXYBUTYRIC ACID: Beta-Hydroxybutyric Acid: 0.08 mmol/L (ref 0.05–0.27)

## 2019-03-18 MED ORDER — ASPIRIN 81 MG PO CHEW
81.0000 mg | CHEWABLE_TABLET | Freq: Every day | ORAL | Status: DC
  Administered 2019-03-18 – 2019-03-20 (×3): 81 mg via ORAL
  Filled 2019-03-18 (×3): qty 1

## 2019-03-18 MED ORDER — INSULIN DETEMIR 100 UNIT/ML ~~LOC~~ SOLN
14.0000 [IU] | Freq: Two times a day (BID) | SUBCUTANEOUS | Status: DC
  Administered 2019-03-18 – 2019-03-19 (×3): 14 [IU] via SUBCUTANEOUS
  Filled 2019-03-18 (×6): qty 0.14

## 2019-03-18 MED ORDER — GUAIFENESIN-DM 100-10 MG/5ML PO SYRP
5.0000 mL | ORAL_SOLUTION | ORAL | Status: DC | PRN
  Administered 2019-03-18 – 2019-03-19 (×4): 5 mL via ORAL
  Filled 2019-03-18 (×4): qty 5

## 2019-03-18 MED ORDER — INSULIN ASPART 100 UNIT/ML ~~LOC~~ SOLN: 0.0000 [IU] | Freq: Every day | SUBCUTANEOUS | Status: DC

## 2019-03-18 MED ORDER — AMLODIPINE BESYLATE 10 MG PO TABS
10.0000 mg | ORAL_TABLET | Freq: Every day | ORAL | Status: DC
  Administered 2019-03-18 – 2019-03-19 (×2): 10 mg via ORAL
  Filled 2019-03-18: qty 2
  Filled 2019-03-18: qty 1

## 2019-03-18 MED ORDER — METOCLOPRAMIDE HCL 10 MG PO TABS
10.0000 mg | ORAL_TABLET | Freq: Three times a day (TID) | ORAL | Status: DC
  Administered 2019-03-18 – 2019-03-20 (×6): 10 mg via ORAL
  Filled 2019-03-18 (×6): qty 1

## 2019-03-18 MED ORDER — SPIRONOLACTONE 25 MG PO TABS
50.0000 mg | ORAL_TABLET | Freq: Every day | ORAL | Status: DC
  Administered 2019-03-18 – 2019-03-20 (×3): 50 mg via ORAL
  Filled 2019-03-18 (×3): qty 2

## 2019-03-18 MED ORDER — CHLORHEXIDINE GLUCONATE CLOTH 2 % EX PADS
6.0000 | MEDICATED_PAD | Freq: Every day | CUTANEOUS | Status: DC
  Administered 2019-03-19 – 2019-03-20 (×2): 6 via TOPICAL

## 2019-03-18 MED ORDER — INSULIN ASPART 100 UNIT/ML ~~LOC~~ SOLN
0.0000 [IU] | Freq: Three times a day (TID) | SUBCUTANEOUS | Status: DC
  Administered 2019-03-19 – 2019-03-20 (×3): 2 [IU] via SUBCUTANEOUS

## 2019-03-18 MED ORDER — INSULIN ASPART PROT & ASPART (70-30 MIX) 100 UNIT/ML ~~LOC~~ SUSP
20.0000 [IU] | Freq: Two times a day (BID) | SUBCUTANEOUS | Status: DC
  Filled 2019-03-18: qty 10

## 2019-03-18 MED ORDER — CARBAMAZEPINE 200 MG PO TABS
200.0000 mg | ORAL_TABLET | Freq: Every day | ORAL | Status: DC
  Administered 2019-03-18 – 2019-03-19 (×2): 200 mg via ORAL
  Filled 2019-03-18 (×3): qty 1

## 2019-03-18 NOTE — ED Notes (Signed)
Ordered a hospital bed--Carrie [name]  

## 2019-03-18 NOTE — Consult Note (Signed)
[facility] Renal Consultation Note    Indication for Consultation:  Management of ESRD/hemodialysis, anemia, hypertension/volume, and secondary hyperparathyroidism. PCP:  HPI: Carrie [name] is a 38 y.o. female with ESRD, HTN, T1DM with gastroparesis who was admitted with hyperglycemia.   Pt was directed to ED from her OP HD center with concern for COVID - apparently had cough, sore throat, and weakness for several weeks. Took z-pack without improvement. No known COVID exposure. No fever or chills. Had also been having N/V which is recurrent issue for her (known gastroparesis). In ED - BP noted to be high (SBP 190-200 range). She had basic labs which showed Glu 1215, Na 117, K 5.5, WBC 6.2, Hgb 9.8. CXR clear. BG showed pH 7.3, B-hydroxybutyric acid 0.08. She was admitted and started on insulin drip and IVF for hyperosmolar hyperglycemia. COVID test was negative.  Currently, seen in ED bed. Is somnolent. Denies CP, dyspnea, vomiting since being here. BS improved to 567 on last check, Na 124, K 4.  Missed her scheduled dialysis yesterday - will try to dialyze her today. She is typically on MWF schedule at [facility] center.  Past Medical History:  Diagnosis Date  . Anemia   . Diabetic neuropathy ([place])   . Diabetic retinopathy ([place])   . ESRD (end stage renal disease) on dialysis [facility])    "MWF; [place]" (02/02/2017)  . Gastroparesis   . HA (headache)    "qd if I didn't take my RX" (02/02/2017)  . Hemodialysis access site with arteriovenous graft ([place])   . Hypertension   . Neuropathy   . Renal insufficiency   . Trigeminal neuralgia   . Type I diabetes mellitus ([place])    Past Surgical History:  Procedure Laterality Date  . AV FISTULA PLACEMENT Left 09/07/2016   Procedure: Left arm 1st Stage Basilic AV Fistula;  Surgeon: [name], MD;  Location: [facility] OR;  Service: Vascular;  Laterality: Left;  . DILATION AND CURETTAGE OF UTERUS    . EXCISIONAL HEMORRHOIDECTOMY     . EYE SURGERY    . INSERTION OF DIALYSIS CATHETER Right 09/07/2016   Procedure: INSERTION OF DIALYSIS CATHETER;  Surgeon: [name], MD;  Location: [place];  Service: Vascular;  Laterality: Right;  . IR FLUORO GUIDE CV LINE LEFT  06/20/2018  . IR US GUIDE VASC ACCESS LEFT  06/20/2018  . RETINAL LASER PROCEDURE Bilateral    "related to diabetes"  . REVISON OF ARTERIOVENOUS FISTULA Left 01/15/2017   Procedure: INSERTION OF  AV GORE-TEX GRAFT;  Surgeon: Elam Dutch, MD;  Location: [place];  Service: Vascular;  Laterality: Left;  . TEE WITHOUT CARDIOVERSION N/A 01/11/2017   Procedure: TRANSESOPHAGEAL ECHOCARDIOGRAM (TEE);  Surgeon: Jerline Pain, MD;  Location: [facility] ENDOSCOPY;  Service: Cardiovascular;  Laterality: N/A;   Family History  Problem Relation Age of Onset  . Hyperlipidemia Mother   . Asthma Daughter   . Diabetes Maternal Grandmother   . Stroke Maternal Grandmother   . Kidney disease Maternal Grandfather   . Kidney disease Paternal Grandmother    Social History:  reports that she has never smoked. She has never used smokeless tobacco. She reports current alcohol use of about 4.0 standard drinks of alcohol per week. She reports that she does not use drugs.  ROS: As per HPI otherwise negative.  Physical Exam: Vitals:   03/18/19 0800 03/18/19 0900 03/18/19 1052 03/18/19 1100  BP: (!) 161/86 (!) 156/96 128/65 131/64  Pulse:   82  Resp: 12 (!) 22 (!) 24 20  Temp:      TempSrc:      SpO2:   91%      General: Somnolent, NAD. Room air. Head: Normocephalic, atraumatic, sclera non-icteric, mucus membranes are moist. Neck: Supple without lymphadenopathy/masses. JVD not elevated. Lungs: Clear bilaterally to auscultation without wheezes, rales, or rhonchi. Breathing is unlabored. Heart: RRR with normal S1, S2. No murmurs, rubs, or gallops appreciated. Abdomen: Soft, non-tender, non-distended with normoactive bowel sounds.  Musculoskeletal:  Strength and tone appear normal for  age. Lower extremities: Trace pedal edema noted, no ischemic changes, no open wounds. Neuro: Alert and oriented X 3, albeit sleepy. Dialysis Access: L forearm AVG + bruit  No Known Allergies Prior to Admission medications   Medication Sig Start Date End Date Taking? Authorizing Provider  amLODipine (NORVASC) 10 MG tablet Take 10 mg by mouth at bedtime. 10/01/17   [provider]  carbamazepine (TEGRETOL) 200 MG tablet Take 1 tablet (200 mg total) by mouth at bedtime. 05/07/17   Annita Brod, MD  cloNIDine (CATAPRES - DOSED IN MG/24 HR) 0.3 mg/24hr patch Place 0.3 mg onto the skin once a week. Not using yet 08/26/18   [provider]  Continuous Blood Gluc Receiver (FREESTYLE LIBRE READER) DEVI 1 Device by Does not apply route daily. 06/24/18   Cherene Altes, MD  Continuous Blood Gluc Sensor (FREESTYLE LIBRE 14 DAY SENSOR) MISC 14 Devices by Does not apply route daily. 06/24/18   Cherene Altes, MD  ferric citrate (AURYXIA) 1 GM 210 MG(Fe) tablet Take 420 mg by mouth 3 (three) times daily with meals.     [provider]  gabapentin (NEURONTIN) 300 MG capsule Take 300-600 mg by mouth See admin instructions. Taking 1 capsule (300mg ) in the Am, and 1 capsule at lunch and 2 Capsules (600mg ) at bedtime. 07/23/18   [provider]  insulin aspart protamine - aspart (NOVOLOG MIX 70/30 FLEXPEN) (70-30) 100 UNIT/ML FlexPen Inject 0.15-0.25 mLs (15-25 Units total) into the skin See admin instructions. On non-dialysis days inj 25 units in the morning and 15 units at night. On dialysis days inj 15 units in the morning and 15 units at night. Patient taking differently: Inject 20 Units into the skin 3 (three) times daily with meals.  10/10/17   Hongalgi, Lenis Dickinson, MD  metoCLOPramide (REGLAN) 10 MG tablet Take 1 tablet (10 mg total) by mouth 3 (three) times daily before meals. 05/28/18   Ward, Delice Bison, DO  minoxidil (LONITEN) 2.5 MG tablet Take 2.5 mg by mouth 2 (two) times  daily. 09/04/18   [provider]  promethazine (PHENERGAN) 25 MG tablet Take 25 mg by mouth every 6 (six) hours as needed. 09/27/18   [provider]  traMADol (ULTRAM-ER) 100 MG 24 hr tablet Take 100 mg by mouth daily as needed for moderate pain. 05/21/18   [provider]   Current Facility-Administered Medications  Medication Dose Route Frequency Provider Last Rate Last Dose  . 0.9 %  sodium chloride infusion   Intravenous Continuous Shela Leff, MD   Stopped at 03/18/19 0450  . Chlorhexidine Gluconate Cloth 2 % PADS 6 each  6 each Topical Q0600 Loren Racer, PA-C      . dextrose 5 %-0.45 % sodium chloride infusion   Intravenous Continuous Shela Leff, MD 50 mL/hr at 03/18/19 0450    . dextrose 50 % solution 0-50 mL  0-50 mL Intravenous PRN Shela Leff, MD      .  heparin injection 5,000 Units  5,000 Units Subcutaneous Q8H Shela Leff, MD   5,000 Units at 03/18/19 0302  . hydrALAZINE (APRESOLINE) injection 5 mg  5 mg Intravenous Q4H PRN Shela Leff, MD      . insulin regular, human (MYXREDLIN) 100 units/ 100 mL infusion   Intravenous Continuous Shela Leff, MD 2.8 mL/hr at 03/18/19 1130 2.8 Units/hr at 03/18/19 1130  . metoCLOPramide (REGLAN) injection 5 mg  5 mg Intravenous Q6H Shela Leff, MD       Current Outpatient Medications  Medication Sig Dispense Refill  . amLODipine (NORVASC) 10 MG tablet Take 10 mg by mouth at bedtime.  3  . carbamazepine (TEGRETOL) 200 MG tablet Take 1 tablet (200 mg total) by mouth at bedtime.  1  . cloNIDine (CATAPRES - DOSED IN MG/24 HR) 0.3 mg/24hr patch Place 0.3 mg onto the skin once a week. Not using yet    . Continuous Blood Gluc Receiver (FREESTYLE LIBRE READER) DEVI 1 Device by Does not apply route daily. 1 Device 0  . Continuous Blood Gluc Sensor (FREESTYLE LIBRE 14 DAY SENSOR) MISC 14 Devices by Does not apply route daily. 14 each 0  . ferric citrate (AURYXIA) 1 GM 210  MG(Fe) tablet Take 420 mg by mouth 3 (three) times daily with meals.     . gabapentin (NEURONTIN) 300 MG capsule Take 300-600 mg by mouth See admin instructions. Taking 1 capsule (300mg ) in the Am, and 1 capsule at lunch and 2 Capsules (600mg ) at bedtime.    . insulin aspart protamine - aspart (NOVOLOG MIX 70/30 FLEXPEN) (70-30) 100 UNIT/ML FlexPen Inject 0.15-0.25 mLs (15-25 Units total) into the skin See admin instructions. On non-dialysis days inj 25 units in the morning and 15 units at night. On dialysis days inj 15 units in the morning and 15 units at night. (Patient taking differently: Inject 20 Units into the skin 3 (three) times daily with meals. )    . metoCLOPramide (REGLAN) 10 MG tablet Take 1 tablet (10 mg total) by mouth 3 (three) times daily before meals. 30 tablet 0  . minoxidil (LONITEN) 2.5 MG tablet Take 2.5 mg by mouth 2 (two) times daily.    . promethazine (PHENERGAN) 25 MG tablet Take 25 mg by mouth every 6 (six) hours as needed.    . traMADol (ULTRAM-ER) 100 MG 24 hr tablet Take 100 mg by mouth daily as needed for moderate pain.     Labs: Basic Metabolic Panel: Recent Labs  Lab 03/17/19 1417 03/17/19 1511 03/17/19 1530 03/18/19 0041  NA 117* 115* 114* 124*  K 5.5* 5.4* 5.4* 4.0  CL 75* 77*  --  83*  CO2 22  --   --  21*  GLUCOSE 1,215* >700*  --  567*  BUN 39* 46*  --  41*  CREATININE 9.51* 9.20*  --  9.79*  CALCIUM 9.3  --   --  9.7   Liver Function Tests: Recent Labs  Lab 03/17/19 1417  AST 23  ALT 28  ALKPHOS 121  BILITOT 1.2  PROT 7.0  ALBUMIN 3.8   Recent Labs  Lab 03/17/19 1417  LIPASE 30   CBC: Recent Labs  Lab 03/17/19 1417 03/17/19 1511 03/17/19 1530  WBC 6.2  --   --   NEUTROABS 5.2  --   --   HGB 9.8* 12.2 11.6*  HCT 31.0* 36.0 34.0*  MCV 101.6*  --   --   PLT 187  --   --  Cardiac Enzymes: No results for input(s): CKTOTAL, CKMB, CKMBINDEX, TROPONINI in the last 168 hours. CBG: Recent Labs  Lab 03/18/19 0553 03/18/19 0658  03/18/19 0908 03/18/19 1126 03/18/19 1247  GLUCAP 161* 158* 179* 232* 258*   Iron Studies: No results for input(s): IRON, TIBC, TRANSFERRIN, FERRITIN in the last 72 hours. Studies/Results: Dg Chest Port 1 View  Result Date: 03/17/2019 CLINICAL DATA:  Cough.  Chronic renal failure EXAM: PORTABLE CHEST 1 VIEW COMPARISON:  October 16, 2018. FINDINGS: There is no edema or consolidation. There is cardiomegaly with pulmonary vascularity normal. No adenopathy. No bone lesions. IMPRESSION: Cardiomegaly.  No edema or consolidation. Electronically Signed   By: Lowella Grip III M.D.   On: 03/17/2019 14:32   Dialysis Orders:  MWF at [facility], 400/A1.5, EDW 77.5kg, 2K/2Ca, AVG, heparin 3000 - Mircera 100 q 2 weeks (last 11/20) - Calcitriol 1.90mcg PO q HD  Assessment/Plan: 1.  Hyperosmolar hyperglycemia/uncontrolled T1DM:  BS > 1200 on admit - improved on insulin drip. Would try to change to [place] insulin and D/c IVFs at this time. 2.  ESRD: Usual MWF schedule - missed yest d/t being in ED. For HD today. 3.  Hypertension/volume: BP much improved with resuming home meds - she is historically non-compliant with all her BP meds and insulin at home. UF as tolerated - she is up 8kg per weights - more reason to d/c IVFs. 4.  Anemia: Hgb 11.6 - not due for ESA yet. 5.  Metabolic bone disease: Ca ok, Phos pending. Continue home binders.   Veneta Penton, PA-C 03/18/2019, 1:32 PM  [facility] Pager: [phone]

## 2019-03-18 NOTE — ED Notes (Signed)
Pt's mother given update via phone

## 2019-03-18 NOTE — Progress Notes (Addendum)
PROGRESS NOTE    Carrie [name]  [id number] DOB: 05/25/1980 DOA: 03/17/2019 PCP: Glendon Axe, MD   Brief Narrative: Carrie [name] is a 38 y.o. female with medical history significant of ESRD on HD MWF, hypertension, poorly controlled type 1 diabetes with multiple prior hospitalizations for DKA, neuropathy, gastroparesis, anemia. Patient presented secondary to a sore throat and found to have HHS via lab work. COVID test (rapid antigen and PCR) negative. Patient with associated hypertensive urgency treated with antihypertensives on admission.   Assessment & Plan:   Principal Problem:   Hyperosmolar hyperglycemic state (HHS) ([place]) Active Problems:   Diabetes ([place])   ESRD (end stage renal disease) ([place])   Hypertensive urgency   HHS Glucose of 1200 on admission with an associated CO2 of 22 and anion gap of 20. Beta-hydroxybutyric acid normal. Patient started in insulin drip -Transition to insulin 70/30 20 units BID -Transition off of insulin drip in 2 hours; discontinue IV fluids once on subcutaneous insulin -BMP  Addendum: Discontinuing insulin 70/30 and will start Levemir 14 units BID with SSI qAC/HS secondary to patient having poor oral intake this evening.  Hypertensive urgency Blood pressure better controlled. Received amlodipine, hydralazine, clonidine in the ED. Currently awaiting HD. -Continue hydralazine 5 mg IV prn -Continue home amlodipine, spironolactone  Hyponatremia Likely pseudohypernatremia in setting of severe hyperglycemia. Improving with improvement of sodium. Repeat BMP delayed secondary to inability to obtain labs  ?Diabetes mellitus, type 1 Patient is on insulin 70/30 as an outpatient. Thankfully not in DKA but with such high glucose on admission, question if she is truly type 1. Patient not very responsive to my questions during interview. -Management above  ESRD On HD in setting of uncontrolled HD. Patient appears to [facility] renal [facility]. -HD per nephrology   DVT prophylaxis: Heparin Code Status:   Code Status: Full Code Family Communication: None Disposition Plan: Discharge pending transition off of insulin drip and stability on subcutaneous insulin regimen   Consultants:   Nephrology  Procedures:   HD  Antimicrobials:  None    Subjective: Patient not interactive with me and ignored most of my questions  Objective: Vitals:   03/18/19 0300 03/18/19 0400 03/18/19 0500 03/18/19 0600  BP: (!) 142/76 (!) 144/73 (!) 142/80 (!) 158/87  Pulse: 78 80 82 81  Resp: 18 (!) 22 16 14   Temp:      TempSrc:      SpO2: 93% 94% 94% 98%    Intake/Output Summary (Last 24 hours) at 03/18/2019 [id number] Last data filed at 03/17/2019 2205 Gross per 24 hour  Intake 400 ml  Output -  Net 400 ml   There were no vitals filed for this visit.  Examination:  General exam: Appears calm and comfortable. Intermittent cough Respiratory system: Clear to auscultation. Respiratory effort normal. Cardiovascular system: S1 & S2 heard, RRR. No murmurs, rubs, gallops or clicks. Gastrointestinal system: Abdomen is nondistended, soft and nontender. No organomegaly or masses felt. Normal bowel sounds heard. Central nervous system: Alert and oriented. No focal neurological deficits. Extremities: No edema. No calf tenderness Skin: No cyanosis. No rashes Psychiatry: Judgement and insight appear normal. Depressed mood and blunt affect. Does not maintain eye contact    Data Reviewed: I have personally reviewed following labs and imaging studies  CBC: Recent Labs  Lab 03/17/19 1417 03/17/19 1511 03/17/19 1530  WBC 6.2  --   --   NEUTROABS 5.2  --   --   HGB 9.8*  12.2 11.6*  HCT 31.0* 36.0 34.0*  MCV 101.6*  --   --   PLT 187  --   --    Basic Metabolic Panel: Recent Labs  Lab 03/17/19 1417 03/17/19 1511 03/17/19 1530 03/18/19 0041  NA 117* 115* 114* 124*  K 5.5* 5.4* 5.4* 4.0  CL 75* 77*  --  83*  CO2 22  --   --   21*  GLUCOSE 1,215* >700*  --  567*  BUN 39* 46*  --  41*  CREATININE 9.51* 9.20*  --  9.79*  CALCIUM 9.3  --   --  9.7   GFR: CrCl cannot be calculated (Unknown ideal weight.). Liver Function Tests: Recent Labs  Lab 03/17/19 1417  AST 23  ALT 28  ALKPHOS 121  BILITOT 1.2  PROT 7.0  ALBUMIN 3.8   Recent Labs  Lab 03/17/19 1417  LIPASE 30   No results for input(s): AMMONIA in the last 168 hours. Coagulation Profile: No results for input(s): INR, PROTIME in the last 168 hours. Cardiac Enzymes: No results for input(s): CKTOTAL, CKMB, CKMBINDEX, TROPONINI in the last 168 hours. BNP (last 3 results) No results for input(s): PROBNP in the last 8760 hours. HbA1C: No results for input(s): HGBA1C in the last 72 hours. CBG: Recent Labs  Lab 03/18/19 0050 03/18/19 0119 03/18/19 0154 03/18/19 0235 03/18/19 0334  GLUCAP 461* 427* 423* 327* 271*   Lipid Profile: No results for input(s): CHOL, HDL, LDLCALC, TRIG, CHOLHDL, LDLDIRECT in the last 72 hours. Thyroid Function Tests: No results for input(s): TSH, T4TOTAL, FREET4, T3FREE, THYROIDAB in the last 72 hours. Anemia Panel: No results for input(s): VITAMINB12, FOLATE, FERRITIN, TIBC, IRON, RETICCTPCT in the last 72 hours. Sepsis Labs: No results for input(s): PROCALCITON, LATICACIDVEN in the last 168 hours.  Recent Results (from the past 240 hour(s))  SARS CORONAVIRUS 2 (TAT 6-24 HRS) Nasopharyngeal Nasopharyngeal Swab     Status: None   Collection Time: 03/17/19  4:59 PM   Specimen: Nasopharyngeal Swab  Result Value Ref Range Status   SARS Coronavirus 2 NEGATIVE NEGATIVE Final    Comment: (NOTE) SARS-CoV-2 target nucleic acids are NOT DETECTED. The SARS-CoV-2 RNA is generally detectable in upper and lower respiratory specimens during the acute phase of infection. Negative results do not preclude SARS-CoV-2 infection, do not rule out co-infections with other pathogens, and should not be used as the sole basis for  treatment or other patient management decisions. Negative results must be combined with clinical observations, patient history, and epidemiological information. The expected result is Negative. Fact Sheet for Patients: SugarRoll.be Fact Sheet for Healthcare Providers: https://www.woods-mathews.com/ This test is not yet approved or cleared by the [place] FDA and  has been authorized for detection and/or diagnosis of SARS-CoV-2 by FDA under an Emergency Use Authorization (EUA). This EUA will remain  in effect (meaning this test can be used) for the duration of the COVID-19 declaration under Section 56 4(b)(1) of the Act, 21 U.S.C. section 360bbb-3(b)(1), unless the authorization is terminated or revoked sooner. Performed at [facility], [place] [address], [place], [place] [address]          Radiology Studies: Dg Chest Port 1 View  Result Date: 03/17/2019 CLINICAL DATA:  Cough.  Chronic renal failure EXAM: PORTABLE CHEST 1 VIEW COMPARISON:  October 16, 2018. FINDINGS: There is no edema or consolidation. There is cardiomegaly with pulmonary vascularity normal. No adenopathy. No bone lesions. IMPRESSION: Cardiomegaly.  No edema or consolidation. Electronically Signed  By: Lowella Grip III M.D.   On: 03/17/2019 14:32        Scheduled Meds: . heparin  5,000 Units Subcutaneous Q8H  . metoCLOPramide (REGLAN) injection  5 mg Intravenous Q6H   Continuous Infusions: . sodium chloride Stopped (03/18/19 0450)  . dextrose 5 % and 0.45% NaCl 50 mL/hr at 03/18/19 0450  . insulin 1.2 Units/hr (03/18/19 0450)     LOS: 0 days     Cordelia Poche, MD [facility] 03/18/2019, 7:13 AM  If 7PM-7AM, please contact night-coverage [email or web address]

## 2019-03-18 NOTE — Progress Notes (Signed)
Inpatient Diabetes Program Recommendations  AACE/ADA: New Consensus Statement on Inpatient Glycemic Control (2015)  Target Ranges:  Prepandial:   less than 140 mg/dL      Peak postprandial:   less than 180 mg/dL (1-2 hours)      Critically ill patients:  140 - 180 mg/dL   Lab Results  Component Value Date   GLUCAP 203 (H) 03/18/2019   HGBA1C 9.7 (H) 06/21/2018    Review of Glycemic Control  Diabetes history: DM1 Outpatient Diabetes medications: Novolog 70/30 insulin mix 20 units bid Current orders for Inpatient glycemic control: IV insulin per Endotool  Inpatient Diabetes Program Recommendations:   Noted labs are pending regarding BMET. Difficulty drawing labs. Patient is not currently eating so would recommend transitioning to Levemir instead of 70/30. -Consider Levemir 12 units bid until eating well. -Novolog sensitive correction tid  Thank you, Nani Gasser. Aralynn Brake, RN, MSN, CDE  Diabetes Coordinator Inpatient Glycemic Control Team Team Pager [phone] (8am-5pm) 03/18/2019 4:01 PM

## 2019-03-18 NOTE — Progress Notes (Signed)
03/18/2019 1440 Received pt to room 4E-03 from ED.  Pt is A&O but very drowsy.  Tele monitor applied and CCMD notified.  CHG bath given.  Oriented to room, call light and bed.  Call bell in reach. [place]

## 2019-03-18 NOTE — ED Notes (Signed)
Dr. Lonny Prude rounding at bedside

## 2019-03-18 NOTE — ED Notes (Signed)
After multiple attempts from staff/lab, unable to get ordered labwork drawn. Dr. Lonny Prude notified.

## 2019-03-18 NOTE — Progress Notes (Signed)
Informed floor Rn Berniece Pap HD Tx rescheduled to 03/19/19 per Dr. Joelyn Oms

## 2019-03-18 NOTE — Progress Notes (Signed)
Inpatient Diabetes Program Recommendations  AACE/ADA: New Consensus Statement on Inpatient Glycemic Control (2015)  Target Ranges:  Prepandial:   less than 140 mg/dL      Peak postprandial:   less than 180 mg/dL (1-2 hours)      Critically ill patients:  140 - 180 mg/dL   Lab Results  Component Value Date   GLUCAP 179 (H) 03/18/2019   HGBA1C 9.7 (H) 06/21/2018    Review of Glycemic Control  Diabetes history: DM1 Outpatient Diabetes medications: Novolog 70/30 insulin 20 units tid Current orders for Inpatient glycemic control: IV insulin   Inpatient Diabetes Program Recommendations:   Noted patient has been seen by diabetes coordinator in the past. Will follow during hospitalization and plan to speak with patient.  Thank you, Nani Gasser. Elman Dettman, RN, MSN, CDE  Diabetes Coordinator Inpatient Glycemic Control Team Team Pager [phone] (8am-5pm) 03/18/2019 10:26 AM

## 2019-03-19 ENCOUNTER — Other Ambulatory Visit: Payer: Self-pay

## 2019-03-19 ENCOUNTER — Encounter (HOSPITAL_COMMUNITY): Payer: Self-pay | Admitting: General Practice

## 2019-03-19 LAB — GLUCOSE, CAPILLARY
Glucose-Capillary: 65 mg/dL — ABNORMAL LOW (ref 70–99)
Glucose-Capillary: 99 mg/dL (ref 70–99)
Glucose-Capillary: 107 mg/dL — ABNORMAL HIGH (ref 70–99)
Glucose-Capillary: 140 mg/dL — ABNORMAL HIGH (ref 70–99)
Glucose-Capillary: 149 mg/dL — ABNORMAL HIGH (ref 70–99)
Glucose-Capillary: 228 mg/dL — ABNORMAL HIGH (ref 70–99)
Glucose-Capillary: 171 mg/dL — ABNORMAL HIGH (ref 70–99)

## 2019-03-19 LAB — HEMOGLOBIN A1C
Hgb A1c MFr Bld: 11.2 % — ABNORMAL HIGH (ref 4.8–5.6)
Mean Plasma Glucose: 275 mg/dL

## 2019-03-19 LAB — BASIC METABOLIC PANEL
Anion gap: 16 — ABNORMAL HIGH (ref 5–15)
BUN: 45 mg/dL — ABNORMAL HIGH (ref 6–20)
CO2: 24 mmol/L (ref 22–32)
Calcium: 9.2 mg/dL (ref 8.9–10.3)
Chloride: 87 mmol/L — ABNORMAL LOW (ref 98–111)
Creatinine, Ser: 10.54 mg/dL — ABNORMAL HIGH (ref 0.44–1.00)
GFR calc Af Amer: 5 mL/min — ABNORMAL LOW (ref >60)
GFR calc non Af Amer: 4 mL/min — ABNORMAL LOW (ref >60)
Glucose, Bld: 98 mg/dL (ref 70–99)
Potassium: 3.6 mmol/L (ref 3.5–5.1)
Sodium: 127 mmol/L — ABNORMAL LOW (ref 135–145)

## 2019-03-19 LAB — MRSA PCR SCREENING: MRSA by PCR: NEGATIVE

## 2019-03-19 MED ORDER — SODIUM CHLORIDE 0.9 % IV SOLN: 100.0000 mL | INTRAVENOUS | Status: DC | PRN

## 2019-03-19 MED ORDER — PROMETHAZINE HCL 25 MG PO TABS
25.0000 mg | ORAL_TABLET | Freq: Four times a day (QID) | ORAL | Status: DC | PRN
  Administered 2019-03-19 (×2): 25 mg via ORAL
  Filled 2019-03-19 (×2): qty 1

## 2019-03-19 MED ORDER — PENTAFLUOROPROP-TETRAFLUOROETH EX AERO: 1.0000 "application " | INHALATION_SPRAY | CUTANEOUS | Status: DC | PRN

## 2019-03-19 MED ORDER — LIDOCAINE-PRILOCAINE 2.5-2.5 % EX CREA: 1.0000 "application " | TOPICAL_CREAM | CUTANEOUS | Status: DC | PRN

## 2019-03-19 MED ORDER — HEPARIN SODIUM (PORCINE) 1000 UNIT/ML DIALYSIS
20.0000 [IU]/kg | INTRAMUSCULAR | Status: DC | PRN
  Administered 2019-03-19: 10:00:00 1700 [IU] via INTRAVENOUS_CENTRAL

## 2019-03-19 MED ORDER — CHLORHEXIDINE GLUCONATE CLOTH 2 % EX PADS: 6.0000 | MEDICATED_PAD | Freq: Every day | CUTANEOUS | Status: DC

## 2019-03-19 MED ORDER — LIDOCAINE HCL (PF) 1 % IJ SOLN: 5.0000 mL | INTRAMUSCULAR | Status: DC | PRN

## 2019-03-19 MED ORDER — HEPARIN SODIUM (PORCINE) 1000 UNIT/ML IJ SOLN
INTRAMUSCULAR | Status: AC
  Administered 2019-03-19: 1700 [IU] via INTRAVENOUS_CENTRAL
  Filled 2019-03-19: qty 2

## 2019-03-19 MED ORDER — GABAPENTIN 300 MG PO CAPS: 300.0000 mg | ORAL_CAPSULE | ORAL | Status: DC

## 2019-03-19 MED ORDER — VITAMIN D (ERGOCALCIFEROL) 1.25 MG (50000 UNIT) PO CAPS: 50000.0000 [IU] | ORAL_CAPSULE | ORAL | Status: DC

## 2019-03-19 MED ORDER — GUAIFENESIN-DM 100-10 MG/5ML PO SYRP
15.0000 mL | ORAL_SOLUTION | ORAL | Status: DC | PRN
  Administered 2019-03-19: 15 mL via ORAL
  Filled 2019-03-19: qty 15

## 2019-03-19 MED ORDER — AZITHROMYCIN 250 MG PO TABS: 250.0000 mg | ORAL_TABLET | ORAL | Status: DC

## 2019-03-19 MED ORDER — GABAPENTIN 300 MG PO CAPS
300.0000 mg | ORAL_CAPSULE | Freq: Every day | ORAL | Status: DC
  Administered 2019-03-19: 300 mg via ORAL
  Filled 2019-03-19: qty 1

## 2019-03-19 MED ORDER — HEPARIN SODIUM (PORCINE) 1000 UNIT/ML DIALYSIS: 20.0000 [IU]/kg | INTRAMUSCULAR | Status: DC | PRN

## 2019-03-19 MED ORDER — FERRIC CITRATE 1 GM 210 MG(FE) PO TABS
420.0000 mg | ORAL_TABLET | Freq: Three times a day (TID) | ORAL | Status: DC
  Administered 2019-03-20 (×2): 420 mg via ORAL
  Filled 2019-03-19 (×4): qty 2

## 2019-03-19 MED ORDER — HEPARIN SODIUM (PORCINE) 1000 UNIT/ML DIALYSIS: 1000.0000 [IU] | INTRAMUSCULAR | Status: DC | PRN

## 2019-03-19 MED ORDER — ALTEPLASE 2 MG IJ SOLR: 2.0000 mg | Freq: Once | INTRAMUSCULAR | Status: DC | PRN

## 2019-03-19 MED ORDER — DIPHENHYDRAMINE HCL 25 MG PO CAPS
25.0000 mg | ORAL_CAPSULE | Freq: Once | ORAL | Status: AC
  Administered 2019-03-19: 23:00:00 25 mg via ORAL
  Filled 2019-03-19: qty 1

## 2019-03-19 NOTE — Progress Notes (Signed)
[id number]

## 2019-03-19 NOTE — Progress Notes (Signed)
Recent blood glucose is 152.  Discontinued the IV insulin drip, dextrose 5%-0.45% sodium chloride infusion, and EndoTool.  Pt is currently resting in bed.  Will continue to monitor.  Lupita Dawn, RN

## 2019-03-19 NOTE — Progress Notes (Signed)
Hospitalist daily note   ICLE MCCLARD [id number] DOB: [birth year] DOA: 03/17/2019  PCP: Glendon Axe, MD   Narrative:  38 year old diabetic since age 8 ESRD MWF [place] since 2017-no visual issues Diabetic neuropathy Migraine headaches Known diabetic gastroparesis Admit with honk hyperglycemia sodium 117 glucose 1215 anion gap 20 pH 7.3 insulin infusion started eventually  Data Reviewed:  CBG trend 99 ~228 had a low of 65 this a.m., A1c 11.0 Sodium 127 BUN/creatinine 45/10.5 Assessment & Plan: Uncontrolled diabetes mellitus with hyperosmolar nonketotic state on admission-patient supposed to have freestyle libre however not compliant could not get it to work we will resume her home meds on discharge 25 units a.m. 70 3015 p.m. for now continue Levemir 14 twice daily sliding scale coverage  Diabetic gastroparesis and neuropathy-nauseous today and does not feel well started Phenergan 25 every 6 as she alternates this with Zofran in the outpatient setting-continue Reglan 10 3 times daily, resuming gabapentin 300 a.m. 600 p.m.  HTN sporadic control continue spironolactone 50 daily amlodipine 10-is on minoxidil and labetalol 4 times a day which I have both held as it is difficult to take any medication >2 times a day-may choose alternative agents or XL metoprolol going forward  ESRD MWF [place] 2017-continue Auryxia 420 3 times daily Drisdol 50,000 units once a week-nephrology feels she is stable for discharge from their perspective   Subjective: Feeling poorly and very weak cannot sit up in bed-quite nauseous and has not touched her breakfast when I saw her Consultants:   Nephrology Procedures:    Antimicrobials:   None   Objective: Vitals:   03/19/19 1000 03/19/19 1030 03/19/19 1056 03/19/19 1136  BP: 115/61 (!) 91/41 (!) 105/49 136/70  Pulse: 84 80 82 88  Resp:   18   Temp:   98.2 F (36.8 C) 98.6 F (37 C)  TempSrc:   Oral Oral  SpO2:   98% 99%  Weight:    82.6 kg   Height:        Intake/Output Summary (Last 24 hours) at 03/19/2019 1739 Last data filed at 03/19/2019 1600 Gross per 24 hour  Intake 1488.17 ml  Output 3439 ml  Net -1950.83 ml   Filed Weights   03/18/19 1445 03/19/19 0745 03/19/19 1056  Weight: 85.7 kg 85.4 kg 82.6 kg    Examination: EOMI NCAT no focal deficit significant acne bullous Throat soft supple S1-S2 no murmur rub or gallop Chest clinically clear No lower extremity edema Abdomen soft Neurologically intact  Scheduled Meds: . amLODipine  10 mg Oral QHS  . aspirin  81 mg Oral Daily  . azithromycin  250-500 mg Oral See admin instructions  . carbamazepine  200 mg Oral QHS  . Chlorhexidine Gluconate Cloth  6 each Topical Q0600  . [START ON 03/20/2019] ferric citrate  420 mg Oral TID WC  . gabapentin  300-600 mg Oral See admin instructions  . heparin  5,000 Units Subcutaneous Q8H  . insulin aspart  0-5 Units Subcutaneous QHS  . insulin aspart  0-6 Units Subcutaneous TID WC  . insulin detemir  14 Units Subcutaneous BID  . metoCLOPramide  10 mg Oral TID AC  . spironolactone  50 mg Oral Daily  . Vitamin D (Ergocalciferol)  50,000 Units Oral Weekly   Continuous Infusions: . sodium chloride Stopped (03/18/19 0450)  . dextrose 5 % and 0.45% NaCl Stopped (03/18/19 2230)  . insulin Stopped (03/18/19 2230)     LOS: 1 day   Time spent: 25  Verneita Griffes, MD [facility]

## 2019-03-19 NOTE — Progress Notes (Signed)
Pt. Request for UF goal increase. Veneta Penton PA aware orders to increase goal to 4L as tolerated. Pt. stable

## 2019-03-19 NOTE — Progress Notes (Signed)
Blood glucose was 65.  Just gave pt about 200 mL orange juice.  Will recheck blood glucose at 0700.  Lupita Dawn, RN

## 2019-03-19 NOTE — Progress Notes (Signed)
Inpatient Diabetes Program Recommendations  AACE/ADA: New Consensus Statement on Inpatient Glycemic Control (2015)  Target Ranges:  Prepandial:   less than 140 mg/dL      Peak postprandial:   less than 180 mg/dL (1-2 hours)      Critically ill patients:  140 - 180 mg/dL   Lab Results  Component Value Date   GLUCAP 149 (H) 03/19/2019   HGBA1C 11.0 (H) 03/18/2019    Review of Glycemic Control Results for Carrie [name], Carrie [name] (MRN [id number]) as of 03/19/2019 13:45  Ref. Range 03/19/2019 06:25 03/19/2019 07:01 03/19/2019 08:07 03/19/2019 11:05 03/19/2019 11:34  Glucose-Capillary Latest Ref Range: 70 - 99 mg/dL 65 (L) 99 107 (H) 140 (H) 149 (H)   Diabetes history: DM1 Outpatient Diabetes medications: Novolog 70/30 insulin mix 20 units bid Current orders for Inpatient glycemic control:Levemir 14 units bid + Novolog correction 0-6 units tid + hs 0-5 units  Inpatient Diabetes Program Recommendations:   -Decrease Levemir to 12 units bid  Thank you, Nani Gasser. Kandy Towery, RN, MSN, CDE  Diabetes Coordinator Inpatient Glycemic Control Team Team Pager [phone] (8am-5pm) 03/19/2019 1:48 PM

## 2019-03-19 NOTE — Procedures (Signed)
I was present at this dialysis session. I have reviewed the session itself and made appropriate changes.   Hyperglycemia resolved.  UF goal 4L.  BP very stable (does she consistently take meds at home?).  Hb 9.8  WIll use 3K bath.    OK for DC from our viewpoint after HD today.   Filed Weights   03/18/19 1445 03/19/19 0745  Weight: 85.7 kg 85.4 kg    Recent Labs  Lab 03/19/19 0256  NA 127*  K 3.6  CL 87*  CO2 24  GLUCOSE 98  BUN 45*  CREATININE 10.54*  CALCIUM 9.2    Recent Labs  Lab 03/17/19 1417 03/17/19 1511 03/17/19 1530  WBC 6.2  --   --   NEUTROABS 5.2  --   --   HGB 9.8* 12.2 11.6*  HCT 31.0* 36.0 34.0*  MCV 101.6*  --   --   PLT 187  --   --     Scheduled Meds: . amLODipine  10 mg Oral QHS  . aspirin  81 mg Oral Daily  . carbamazepine  200 mg Oral QHS  . Chlorhexidine Gluconate Cloth  6 each Topical Q0600  . heparin      . heparin  5,000 Units Subcutaneous Q8H  . insulin aspart  0-5 Units Subcutaneous QHS  . insulin aspart  0-6 Units Subcutaneous TID WC  . insulin detemir  14 Units Subcutaneous BID  . metoCLOPramide  10 mg Oral TID AC  . spironolactone  50 mg Oral Daily   Continuous Infusions: . sodium chloride Stopped (03/18/19 0450)  . sodium chloride    . sodium chloride    . dextrose 5 % and 0.45% NaCl Stopped (03/18/19 2230)  . insulin Stopped (03/18/19 2230)   PRN Meds:.sodium chloride, sodium chloride, alteplase, dextrose, guaiFENesin-dextromethorphan, heparin, heparin, hydrALAZINE, lidocaine (PF), lidocaine-prilocaine, pentafluoroprop-tetrafluoroeth   Pearson Grippe  MD 03/19/2019, 9:23 AM

## 2019-03-19 NOTE — Progress Notes (Signed)
Pt has now received 14 Units Levemir insulin subcutaneous.  Will continue IV insulin regular drip and the IV dextrose 5%-0.45% sodium chloride infusion for two hours and then discontinue, according to daytime nurse.  Will continue to monitor.  Lupita Dawn, RN

## 2019-03-20 LAB — BASIC METABOLIC PANEL
Anion gap: 16 — ABNORMAL HIGH (ref 5–15)
BUN: 27 mg/dL — ABNORMAL HIGH (ref 6–20)
CO2: 23 mmol/L (ref 22–32)
Calcium: 9.1 mg/dL (ref 8.9–10.3)
Chloride: 93 mmol/L — ABNORMAL LOW (ref 98–111)
Creatinine, Ser: 7 mg/dL — ABNORMAL HIGH (ref 0.44–1.00)
GFR calc Af Amer: 8 mL/min — ABNORMAL LOW (ref >60)
GFR calc non Af Amer: 7 mL/min — ABNORMAL LOW (ref >60)
Glucose, Bld: 42 mg/dL — CL (ref 70–99)
Potassium: 3.9 mmol/L (ref 3.5–5.1)
Sodium: 132 mmol/L — ABNORMAL LOW (ref 135–145)

## 2019-03-20 LAB — GLUCOSE, CAPILLARY
Glucose-Capillary: 138 mg/dL — ABNORMAL HIGH (ref 70–99)
Glucose-Capillary: 36 mg/dL — CL (ref 70–99)
Glucose-Capillary: 123 mg/dL — ABNORMAL HIGH (ref 70–99)
Glucose-Capillary: 84 mg/dL (ref 70–99)
Glucose-Capillary: 202 mg/dL — ABNORMAL HIGH (ref 70–99)
Glucose-Capillary: 248 mg/dL — ABNORMAL HIGH (ref 70–99)

## 2019-03-20 MED ORDER — INSULIN DETEMIR 100 UNIT/ML ~~LOC~~ SOLN
10.0000 [IU] | Freq: Two times a day (BID) | SUBCUTANEOUS | Status: DC
  Administered 2019-03-20: 10 [IU] via SUBCUTANEOUS
  Filled 2019-03-20 (×2): qty 0.1

## 2019-03-20 MED ORDER — NOVOLOG MIX 70/30 FLEXPEN (70-30) 100 UNIT/ML ~~LOC~~ SUPN: 10.0000 [IU] | PEN_INJECTOR | Freq: Two times a day (BID) | SUBCUTANEOUS | 11 refills | Status: AC

## 2019-03-20 MED ORDER — GABAPENTIN 300 MG PO CAPS: 300.0000 mg | ORAL_CAPSULE | Freq: Every day | ORAL | Status: AC

## 2019-03-20 MED ORDER — BENZONATATE 100 MG PO CAPS
100.0000 mg | ORAL_CAPSULE | Freq: Three times a day (TID) | ORAL | Status: DC | PRN
  Administered 2019-03-20: 03:00:00 100 mg via ORAL
  Filled 2019-03-20: qty 1

## 2019-03-20 MED ORDER — GUAIFENESIN-DM 100-10 MG/5ML PO SYRP: 15.0000 mL | ORAL_SOLUTION | ORAL | 0 refills | Status: AC | PRN

## 2019-03-20 MED ORDER — BENZONATATE 100 MG PO CAPS: 100.0000 mg | ORAL_CAPSULE | Freq: Three times a day (TID) | ORAL | 0 refills | Status: AC | PRN

## 2019-03-20 NOTE — Plan of Care (Signed)
Discharge to home °

## 2019-03-20 NOTE — Progress Notes (Signed)
Lab informed nurse the pt's glucose level was 42. Nurse found pt deep asleep and covered in sweat.  Checked blood glucose which was 36.  Immediately gave 50 mL Dextrose injection IV and 240 mL orange juice with three extra sugar packs.  Pt became more awake after the Dextrose was given. Rechecked glucose again and it now showed 138.  Pt is awake and up.  Informed on call hospitalist.  Will continue to monitor.  Lupita Dawn, RN

## 2019-03-20 NOTE — Discharge Summary (Signed)
Physician Discharge Summary  OTA RACER [id number] DOB: Feb 24, 1981 DOA: 03/17/2019  PCP: Glendon Axe, MD  Admit date: 03/17/2019 Discharge date: 03/20/2019  Time spent: 32 minutes  Recommendations for Outpatient Follow-up:  1. Note dosage change insulin to 10 U bid 2. Needs OP follow-up for dialysis  Discharge Diagnoses:  Principal Problem:   Hyperosmolar hyperglycemic state (HHS) ([place]) Active Problems:   Diabetes ([place])   ESRD (end stage renal disease) ([place])   Hypertensive urgency   Discharge Condition: improved  Diet recommendation: renal diabetic  Filed Weights   03/18/19 1445 03/19/19 0745 03/19/19 1056  Weight: 85.7 kg 85.4 kg 82.6 kg    History of present illness:  38 year old diabetic since age 30 ESRD MWF [name] since 2017-no visual issues Diabetic neuropathy Migraine headaches Known diabetic gastroparesis Admit with honk hyperglycemia sodium 117 glucose 1215 anion gap 20 pH 7.3 insulin infusion started eventually  Hospital Course:  Uncontrolled diabetes mellitus with hyperosmolar nonketotic state an complications hypoglycemia this admit-patient supposed to have freestyle libre however not compliant could not get it to work  -- on discharge 10 U 70/30  Diabetic gastroparesis and neuropathy-nauseous today and does not feel well started Phenergan 25 every 6 as she alternates this with Zofran in the outpatient setting-continue Reglan 10 3 times daily, resuming gabapentin 300 hs only as wasn't taking before  HTN sporadic control continue spironolactone 50 daily amlodipine 10-is on minoxidil and labetalol 4 times a day which I have both held as it is difficult to take any medication >2 times a day-may choose alternative agents or XL metoprolol going forward as OP  ESRD MWF [place] 2017-continue Auryxia 420 3 times daily Drisdol 50,000 units once a week-nephrology feels she is stable for discharge from their  perspective  Procedures:  dialysis  Consultations:   Renal  Discharge Exam: Vitals:   03/20/19 0839 03/20/19 1108  BP: (!) 197/105 (!) 161/79  Pulse: 77 85  Resp: 17 20  Temp:  98.2 F (36.8 C)  SpO2: 99% 99%    General: awake coherent in nad no focal deficit Cardiovascular:  s1 s 2no m/r/g Respiratory: clear with no added sound no rales no rhonchi abd soft nt nd no rebound no gaurd  Discharge Instructions   Discharge Instructions    Diet - low sodium heart healthy   Complete by: As directed    Discharge instructions   Complete by: As directed    Take less insulin 10 U bid Make sure you get follow-up with Endocrinology to discuss some of the needs such as the Freestyle Libre--check ur sugars at least 2 x a day.  Notice that ur gabapentin dose is lowered   Increase activity slowly   Complete by: As directed      Allergies as of 03/20/2019   No Known Allergies     Medication List    STOP taking these medications   FreeStyle Libre 14 Day Sensor Misc   FreeStyle Libre Reader Devi   minoxidil 2.5 MG tablet Commonly known as: LONITEN   promethazine 25 MG tablet Commonly known as: PHENERGAN   Zithromax Z-Pak 250 MG tablet Generic drug: azithromycin     TAKE these medications   amLODipine 10 MG tablet Commonly known as: NORVASC Take 10 mg by mouth at bedtime.   aspirin 81 MG chewable tablet Chew 81 mg by mouth daily.   benzonatate 100 MG capsule Commonly known as: TESSALON Take 1 capsule (100 mg total) by mouth 3 (three) times daily  as needed for cough.   carbamazepine 200 MG tablet Commonly known as: TEGRETOL Take 1 tablet (200 mg total) by mouth at bedtime.   ferric citrate 1 GM 210 MG(Fe) tablet Commonly known as: AURYXIA Take 420 mg by mouth 3 (three) times daily with meals.   gabapentin 300 MG capsule Commonly known as: NEURONTIN Take 1 capsule (300 mg total) by mouth at bedtime. What changed:   how much to take  when to take  this  additional instructions   guaiFENesin-dextromethorphan 100-10 MG/5ML syrup Commonly known as: ROBITUSSIN DM Take 15 mLs by mouth every 4 (four) hours as needed for cough.   labetalol 300 MG tablet Commonly known as: NORMODYNE Take 300 mg by mouth 4 (four) times daily.   metoCLOPramide 10 MG tablet Commonly known as: Reglan Take 1 tablet (10 mg total) by mouth 3 (three) times daily before meals.   NovoLOG Mix 70/30 FlexPen (70-30) 100 UNIT/ML FlexPen Generic drug: insulin aspart protamine - aspart Inject 0.1 mLs (10 Units total) into the skin 2 (two) times daily with a meal. On non-dialysis days inj 25 units in the morning and 15 units at night. On dialysis days inj 15 units in the morning and 15 units at night. What changed:   how much to take  when to take this   spironolactone 25 MG tablet Commonly known as: ALDACTONE Take 50 mg by mouth daily.   traMADol 100 MG 24 hr tablet Commonly known as: ULTRAM-ER Take 100 mg by mouth daily as needed for moderate pain.   Vitamin D (Ergocalciferol) 1.25 MG (50000 UT) Caps capsule Commonly known as: DRISDOL Take 50,000 Units by mouth once a week. Mondays      No Known Allergies    The results of significant diagnostics from this hospitalization (including imaging, microbiology, ancillary and laboratory) are listed below for reference.    Significant Diagnostic Studies: Dg Chest Port 1 View  Result Date: 03/17/2019 CLINICAL DATA:  Cough.  Chronic renal failure EXAM: PORTABLE CHEST 1 VIEW COMPARISON:  October 16, 2018. FINDINGS: There is no edema or consolidation. There is cardiomegaly with pulmonary vascularity normal. No adenopathy. No bone lesions. IMPRESSION: Cardiomegaly.  No edema or consolidation. Electronically Signed   By: Lowella Grip III M.D.   On: 03/17/2019 14:32    Microbiology: Recent Results (from the past 240 hour(s))  SARS CORONAVIRUS 2 (TAT 6-24 HRS) Nasopharyngeal Nasopharyngeal Swab     Status:  None   Collection Time: 03/17/19  4:59 PM   Specimen: Nasopharyngeal Swab  Result Value Ref Range Status   SARS Coronavirus 2 NEGATIVE NEGATIVE Final    Comment: (NOTE) SARS-CoV-2 target nucleic acids are NOT DETECTED. The SARS-CoV-2 RNA is generally detectable in upper and lower respiratory specimens during the acute phase of infection. Negative results do not preclude SARS-CoV-2 infection, do not rule out co-infections with other pathogens, and should not be used as the sole basis for treatment or other patient management decisions. Negative results must be combined with clinical observations, patient history, and epidemiological information. The expected result is Negative. Fact Sheet for Patients: SugarRoll.be Fact Sheet for Healthcare Providers: https://www.woods-mathews.com/ This test is not yet approved or cleared by the [place] FDA and  has been authorized for detection and/or diagnosis of SARS-CoV-2 by FDA under an Emergency Use Authorization (EUA). This EUA will remain  in effect (meaning this test can be used) for the duration of the COVID-19 declaration under Section 56 4(b)(1) of the Act, 21 U.S.C. section  360bbb-3(b)(1), unless the authorization is terminated or revoked sooner. Performed at [facility], [place] [address], [place], [place] [address]   MRSA PCR Screening     Status: None   Collection Time: 03/19/19  5:44 AM   Specimen: Nasopharyngeal  Result Value Ref Range Status   MRSA by PCR NEGATIVE NEGATIVE Final    Comment:        The GeneXpert MRSA Assay (FDA approved for NASAL specimens only), is one component of a comprehensive MRSA colonization surveillance program. It is not intended to diagnose MRSA infection nor to guide or monitor treatment for MRSA infections. Performed at [facility], [place] [address], [place], [place] [address]      Labs: Basic Metabolic Panel: Recent Labs  Lab  03/17/19 1417 03/17/19 1511 03/17/19 1530 03/18/19 0041 03/18/19 1559 03/19/19 0256 03/20/19 0337  NA 117* 115* 114* 124* 127* 127* 132*  K 5.5* 5.4* 5.4* 4.0 3.6 3.6 3.9  CL 75* 77*  --  83* 84* 87* 93*  CO2 22  --   --  21* 23 24 23   GLUCOSE 1,215* >700*  --  567* 154* 98 42*  BUN 39* 46*  --  41* 44* 45* 27*  CREATININE 9.51* 9.20*  --  9.79* 10.53* 10.54* 7.00*  CALCIUM 9.3  --   --  9.7 9.6 9.2 9.1   Liver Function Tests: Recent Labs  Lab 03/17/19 1417  AST 23  ALT 28  ALKPHOS 121  BILITOT 1.2  PROT 7.0  ALBUMIN 3.8   Recent Labs  Lab 03/17/19 1417  LIPASE 30   No results for input(s): AMMONIA in the last 168 hours. CBC: Recent Labs  Lab 03/17/19 1417 03/17/19 1511 03/17/19 1530  WBC 6.2  --   --   NEUTROABS 5.2  --   --   HGB 9.8* 12.2 11.6*  HCT 31.0* 36.0 34.0*  MCV 101.6*  --   --   PLT 187  --   --    Cardiac Enzymes: No results for input(s): CKTOTAL, CKMB, CKMBINDEX, TROPONINI in the last 168 hours. BNP: BNP (last 3 results) No results for input(s): BNP in the last 8760 hours.  ProBNP (last 3 results) No results for input(s): PROBNP in the last 8760 hours.  CBG: Recent Labs  Lab 03/20/19 0445 03/20/19 0505 03/20/19 0555 03/20/19 0824 03/20/19 1106  GLUCAP 36* 138* 123* 84 202*       Signed:  Nita Sells MD   [facility] 03/20/2019, 11:44 AM

## 2019-03-20 NOTE — Progress Notes (Signed)
Admit: 03/17/2019 LOS: 2  20F ESRD with sever hyperglycemia  Subjective:  . HD yesterday, toelrated well Some hypoglycemia overnight . Bps improved . Likely for DC today   12/02 0701 - 12/03 0700 In: 650.7 [P.O.:444; I.V.:206.7] Out: 3439   Filed Weights   03/18/19 1445 03/19/19 0745 03/19/19 1056  Weight: 85.7 kg 85.4 kg 82.6 kg    Scheduled Meds: . amLODipine  10 mg Oral QHS  . aspirin  81 mg Oral Daily  . carbamazepine  200 mg Oral QHS  . Chlorhexidine Gluconate Cloth  6 each Topical Q0600  . ferric citrate  420 mg Oral TID WC  . gabapentin  300 mg Oral QHS  . heparin  5,000 Units Subcutaneous Q8H  . insulin aspart  0-5 Units Subcutaneous QHS  . insulin aspart  0-6 Units Subcutaneous TID WC  . insulin detemir  10 Units Subcutaneous BID  . metoCLOPramide  10 mg Oral TID AC  . spironolactone  50 mg Oral Daily  . [START ON 03/24/2019] Vitamin D (Ergocalciferol)  50,000 Units Oral Q Mon   Continuous Infusions: . sodium chloride Stopped (03/18/19 0450)  . dextrose 5 % and 0.45% NaCl Stopped (03/18/19 2230)   PRN Meds:.benzonatate, dextrose, guaiFENesin-dextromethorphan, hydrALAZINE, promethazine  Current Labs: reviewed   Physical Exam:  Blood pressure (!) 161/79, pulse 85, temperature 98.2 F (36.8 C), temperature source Oral, resp. rate 20, height 5\' 8"  (1.727 m), weight 82.6 kg, SpO2 99 %. Resting NAD RRR CTAB No sig LEE AVG +B/T S/nt/nd Nonfocal  Dialysis Orders:  MWF at [facility], 400/A1.5, EDW 77.5kg, 2K/2Ca, AVG, heparin 3000 - Mircera 100 q 2 weeks (last 11/20) - Calcitriol 1.21mcg PO q HD  A Assessment/Plan: 1.  Hyperosmolar hyperglycemia/uncontrolled T1DM:  BS > 1200 on admit - improved on insulin drip. Resolved.  TRH adjusting insulin. 2. ESRD: Usual MWF schedule - on schedule.  Should DC today, if here tomorrow should try for early DC and outpt HD. 3.  Hypertension/volume: BP much improved with resuming home meds  4. Anemia: Hgb 11.6  - CTM 5.  Metabolic bone disease: Continue home binders.   Pearson Grippe MD 03/20/2019, 11:50 AM  Recent Labs  Lab 03/18/19 1559 03/19/19 0256 03/20/19 0337  NA 127* 127* 132*  K 3.6 3.6 3.9  CL 84* 87* 93*  CO2 23 24 23   GLUCOSE 154* 98 42*  BUN 44* 45* 27*  CREATININE 10.53* 10.54* 7.00*  CALCIUM 9.6 9.2 9.1   Recent Labs  Lab 03/17/19 1417 03/17/19 1511 03/17/19 1530  WBC 6.2  --   --   NEUTROABS 5.2  --   --   HGB 9.8* 12.2 11.6*  HCT 31.0* 36.0 34.0*  MCV 101.6*  --   --   PLT 187  --   --

## 2019-03-20 NOTE — Progress Notes (Signed)
Inpatient Diabetes Program Recommendations  AACE/ADA: New Consensus Statement on Inpatient Glycemic Control (2015)  Target Ranges:  Prepandial:   less than 140 mg/dL      Peak postprandial:   less than 180 mg/dL (1-2 hours)      Critically ill patients:  140 - 180 mg/dL   Lab Results  Component Value Date   GLUCAP 84 03/20/2019   HGBA1C 11.0 (H) 03/18/2019    Review of Glycemic Control Results for Carrie [name], Carrie [name] (MRN [id number]) as of 03/20/2019 09:49  Ref. Range 03/20/2019 04:45 03/20/2019 05:05 03/20/2019 05:55 03/20/2019 08:24  Glucose-Capillary Latest Ref Range: 70 - 99 mg/dL 36 (LL) 138 (H) 123 (H) 84   Diabetes history:DM1 Outpatient Diabetes medications:Novolog 70/30 insulin mix 20 units bid Current orders for Inpatient glycemic control:Levemir 10 units bid + Novolog correction 0-6 units tid + hs 0-5 units  Inpatient Diabetes Program Recommendations:    Noted hypoglycemic episode this AM of 36 mg/dL with associated interventions and new order changes. In agreement, will continue to follow.   Thanks, Bronson Curb, MSN, RNC-OB Diabetes Coordinator [phone] (8a-5p)

## 2019-03-20 NOTE — Plan of Care (Signed)
Continue to monitor

## 2019-03-20 NOTE — Progress Notes (Signed)
IV and telemetry discontinued. CCMD notified. Patient tolerated well. Discharge instructions reviewed with patient and all questions answered.   Emelda Fear, RN

## 2019-04-16 IMAGING — CT CT ANGIO CHEST
2 of 8 series · 18 of 46 positions shown · IV contrast (OMNI)
Comparison: Chest radiograph 09/07/2016

CLINICAL DATA: Possible pulmonary embolus. End-stage renal disease
on dialysis. Right atrial mass on echocardiogram.

EXAM:
CT ANGIOGRAPHY CHEST WITH CONTRAST
TECHNIQUE: Multidetector CT imaging of the chest was performed using the
standard protocol during bolus administration of intravenous
contrast. Multiplanar CT image reconstructions and MIPs were
obtained to evaluate the vascular anatomy.
CONTRAST:  80 mL Isovue 370

[Series 6: thins · axial · 0.71mm/px · z∈[+1198,+1449]mm · 15 of 277 slices shown]
[im 13/277  lung]
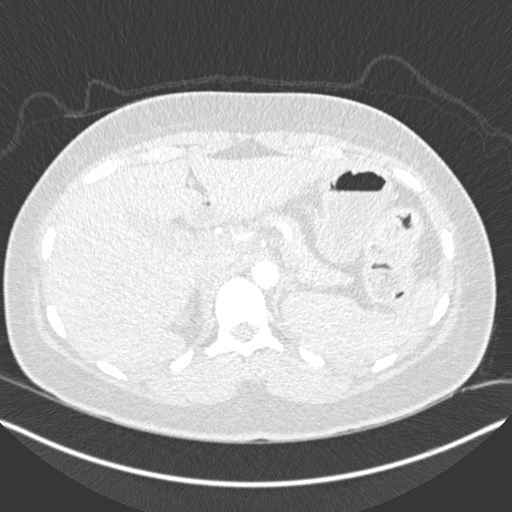
[im 38/277  soft-tissue]
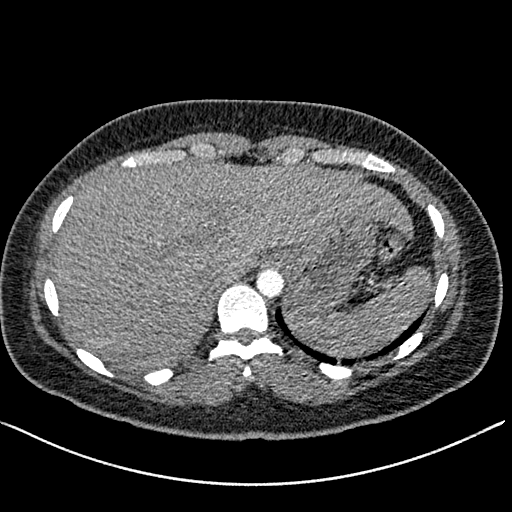
[im 51/277  lung]
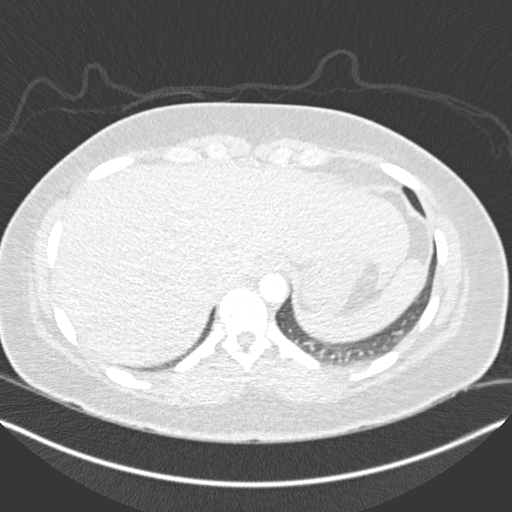
[im 63/277  soft-tissue]
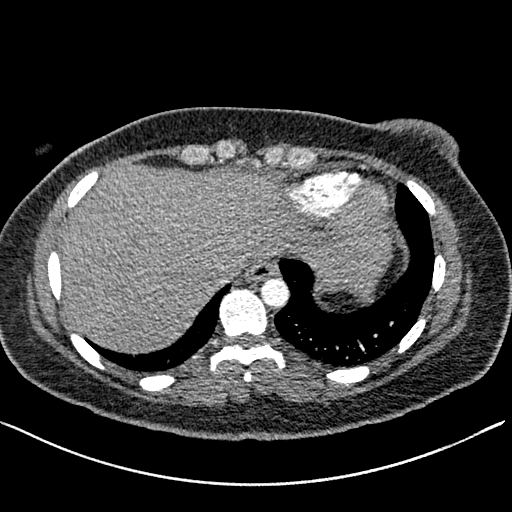
[im 88/277  lung]
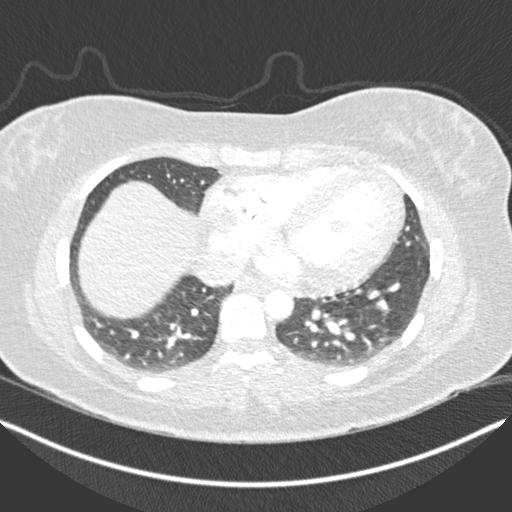
[im 101/277  soft-tissue]
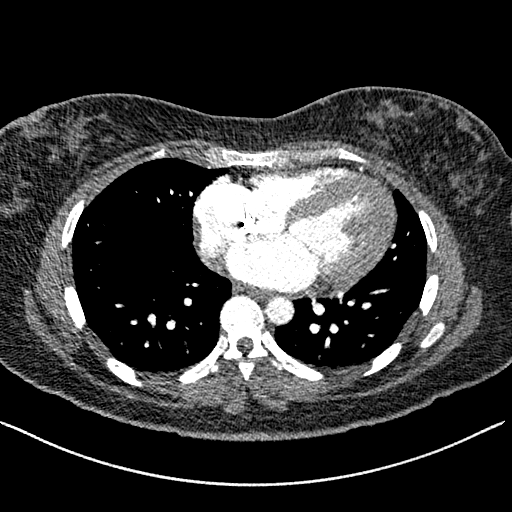
[im 126/277  lung]
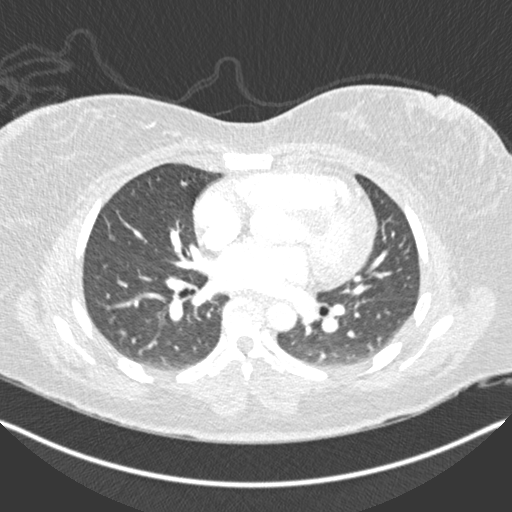
[im 139/277  soft-tissue]
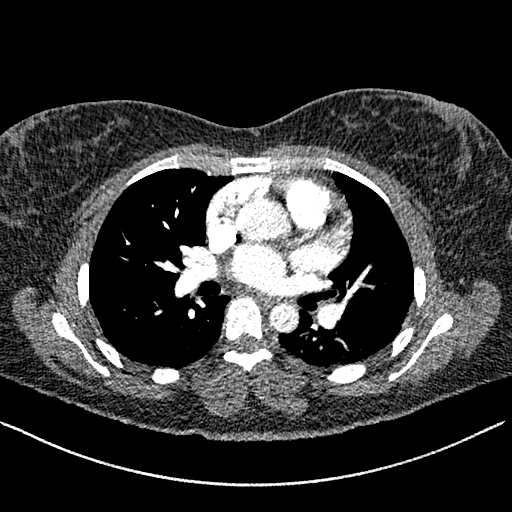
[im 151/277  lung]
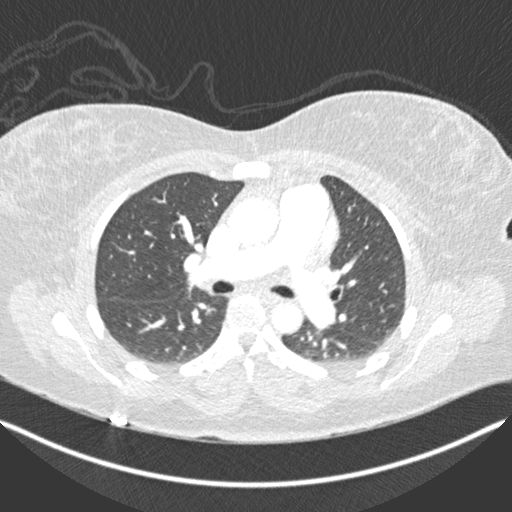
[im 176/277  soft-tissue]
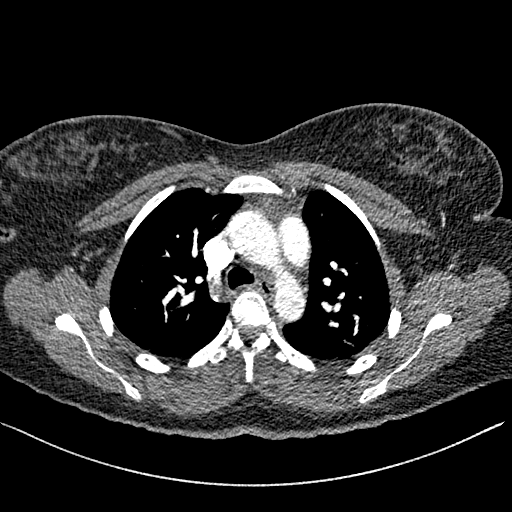
[im 189/277  lung]
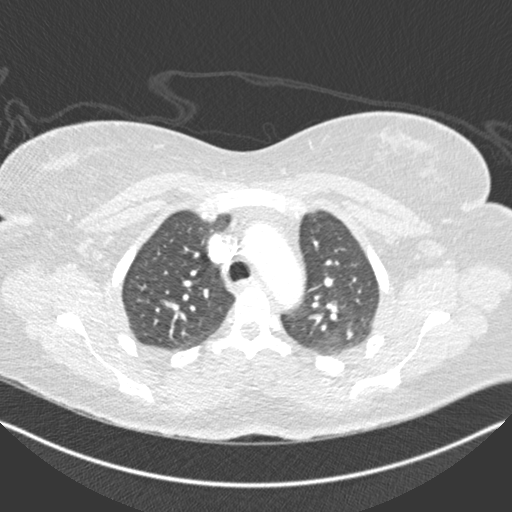
[im 214/277  soft-tissue]
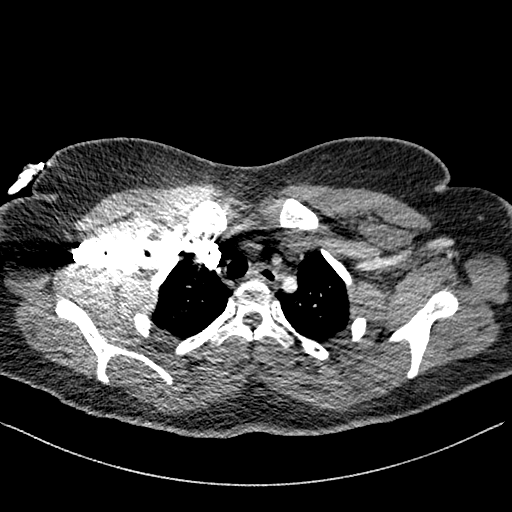
[im 226/277  lung]
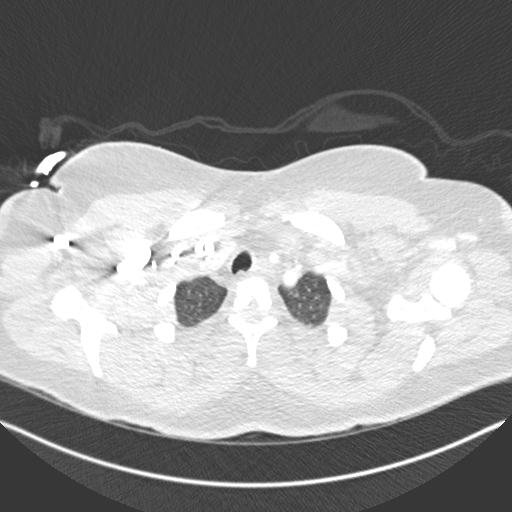
[im 239/277  soft-tissue]
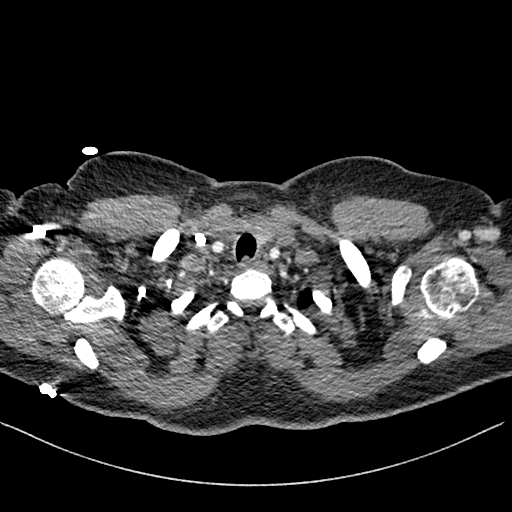
[im 264/277  lung]
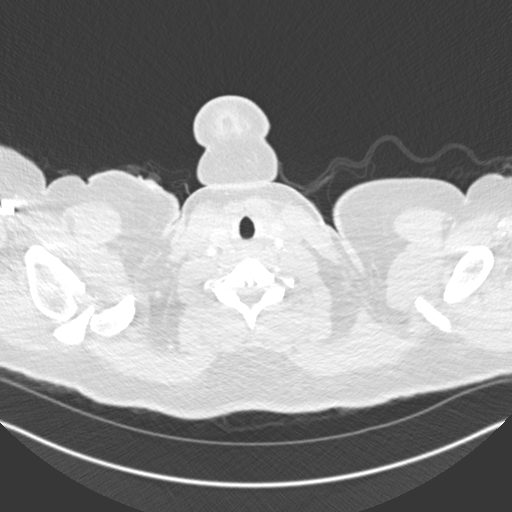

[Series 8: coronal mpr · coronal · 0.54mm/px · 3 of 147 slices shown]
[im 37/147  soft-tissue]
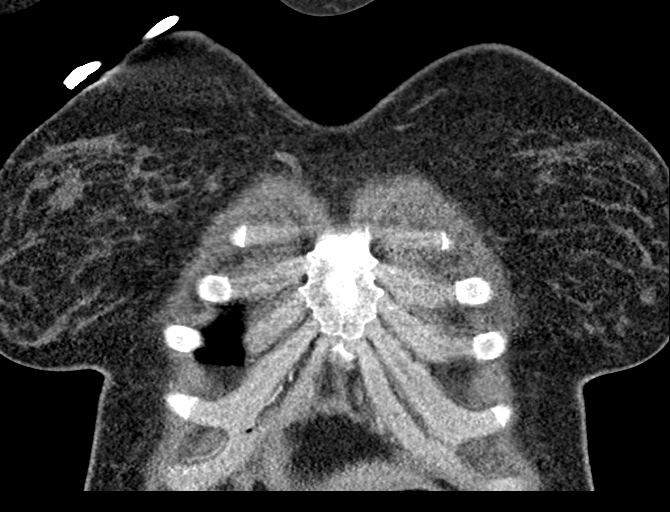
[im 74/147  soft-tissue]
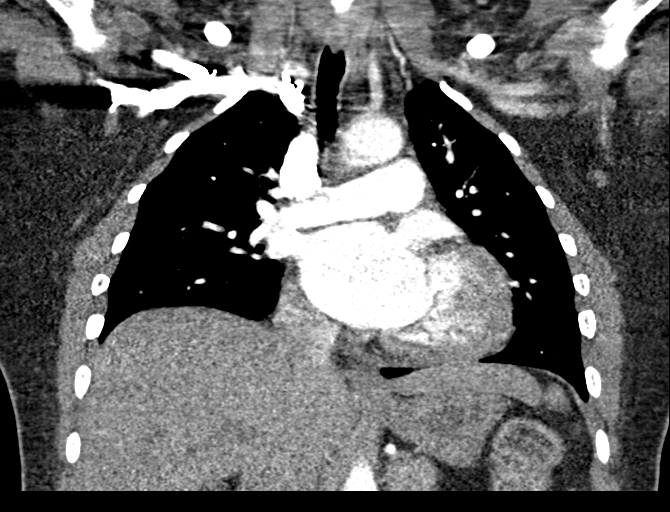
[im 110/147  soft-tissue]
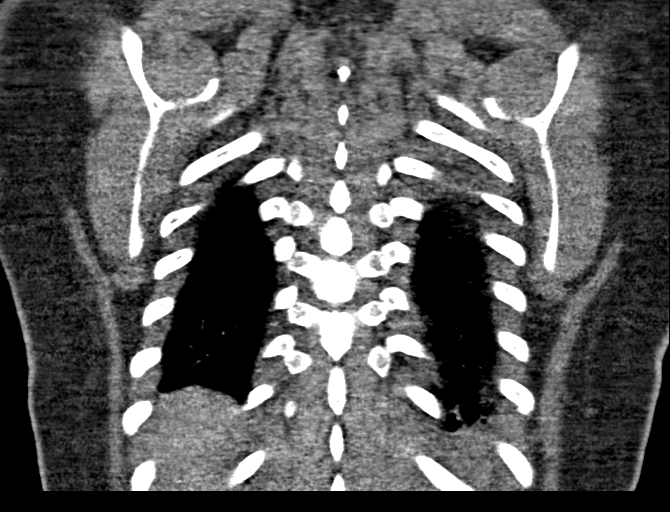

[18 of 46 positions shown; findings below may reference images not displayed]

FINDINGS: Cardiovascular: Pulmonary arterial opacification is adequate to the
segmental level without evidence of emboli. A right jugular central
venous catheter terminates near the tricuspid valve. There is
suggestion of a filling defect in the posterior right atrium,
however this is not not well evaluated due to motion artifact and
contrast timing and the appearance may be secondary to inflow of
non-opacified blood from the IVC. The heart is borderline enlarged.
There is no pericardial effusion. The thoracic aorta is normal in
caliber.

Mediastinum/Nodes: No enlarged axillary, mediastinal, or hilar lymph
nodes are identified. The thyroid and esophagus are grossly
unremarkable.

Lungs/Pleura: No pleural effusion or pneumothorax. Mild respiratory
motion artifact without lung consolidation or mass.

Upper Abdomen: Unremarkable.

Musculoskeletal: No acute osseous abnormality or suspicious osseous
lesion.

Review of the MIP images confirms the above findings.
IMPRESSION: 1. No evidence of pulmonary emboli.
2. Right atrial mass on echocardiogram not well evaluated on this
nondedicated CTA.

## 2019-04-23 ENCOUNTER — Other Ambulatory Visit: Payer: Self-pay

## 2019-04-23 ENCOUNTER — Encounter (HOSPITAL_COMMUNITY): Payer: Self-pay | Admitting: Emergency Medicine

## 2019-04-23 ENCOUNTER — Emergency Department (HOSPITAL_COMMUNITY)
Admission: EM | Admit: 2019-04-23 | Discharge: 2019-04-24 | Discharge status: Against Medical Advice | Payer: Medicare Other | Attending: Emergency Medicine | Admitting: Emergency Medicine

## 2019-04-23 DIAGNOSIS — M79661 Pain in right lower leg: Secondary | ICD-10-CM | POA: Insufficient documentation

## 2019-04-23 DIAGNOSIS — I10 Essential (primary) hypertension: Secondary | ICD-10-CM | POA: Insufficient documentation

## 2019-04-23 DIAGNOSIS — M79662 Pain in left lower leg: Secondary | ICD-10-CM | POA: Insufficient documentation

## 2019-04-23 LAB — COMPREHENSIVE METABOLIC PANEL
ALT: 21 U/L (ref 0–44)
AST: 23 U/L (ref 15–41)
Albumin: 3.7 g/dL (ref 3.5–5.0)
Alkaline Phosphatase: 100 U/L (ref 38–126)
Anion gap: 12 (ref 5–15)
BUN: 12 mg/dL (ref 6–20)
CO2: 31 mmol/L (ref 22–32)
Calcium: 9.4 mg/dL (ref 8.9–10.3)
Chloride: 93 mmol/L — ABNORMAL LOW (ref 98–111)
Creatinine, Ser: 4.77 mg/dL — ABNORMAL HIGH (ref 0.44–1.00)
GFR calc Af Amer: 13 mL/min — ABNORMAL LOW (ref >60)
GFR calc non Af Amer: 11 mL/min — ABNORMAL LOW (ref >60)
Glucose, Bld: 230 mg/dL — ABNORMAL HIGH (ref 70–99)
Potassium: 4.1 mmol/L (ref 3.5–5.1)
Sodium: 136 mmol/L (ref 135–145)
Total Bilirubin: 0.9 mg/dL (ref 0.3–1.2)
Total Protein: 6.7 g/dL (ref 6.5–8.1)

## 2019-04-23 LAB — CBC WITH DIFFERENTIAL/PLATELET
Abs Immature Granulocytes: 0.02 10*3/uL (ref 0.00–0.07)
Basophils Absolute: 0.1 10*3/uL (ref 0.0–0.1)
Basophils Relative: 2 %
Eosinophils Absolute: 0.2 10*3/uL (ref 0.0–0.5)
Eosinophils Relative: 3 %
HCT: 35.6 % — ABNORMAL LOW (ref 36.0–46.0)
Hemoglobin: 11.2 g/dL — ABNORMAL LOW (ref 12.0–15.0)
Immature Granulocytes: 0 %
Lymphocytes Relative: 12 %
Lymphs Abs: 0.7 10*3/uL (ref 0.7–4.0)
MCH: 30.5 pg (ref 26.0–34.0)
MCHC: 31.5 g/dL (ref 30.0–36.0)
MCV: 97 fL (ref 80.0–100.0)
Monocytes Absolute: 0.5 10*3/uL (ref 0.1–1.0)
Monocytes Relative: 8 %
Neutro Abs: 4.5 10*3/uL (ref 1.7–7.7)
Neutrophils Relative %: 75 %
Platelets: 305 10*3/uL (ref 150–400)
RBC: 3.67 MIL/uL — ABNORMAL LOW (ref 3.87–5.11)
RDW: 16.3 % — ABNORMAL HIGH (ref 11.5–15.5)
WBC: 6 10*3/uL (ref 4.0–10.5)
nRBC: 0 % (ref 0.0–0.2)

## 2019-04-23 LAB — I-STAT BETA HCG BLOOD, ED (MC, WL, AP ONLY): I-stat hCG, quantitative: <5 m[IU]/mL (ref <5)

## 2019-04-23 NOTE — ED Triage Notes (Signed)
Patient arrived with EMS from home reports bilateral lower legs/feet pain this afternoon , denies injury/ambulatory , CBG= 409 this evening she took 10 units insulin prior to arrival , hypertensive at arrival she has not taken her antihypertensive medication , HD q Mon/Wed/Fri. Denies fever or chills .

## 2019-04-24 NOTE — ED Notes (Signed)
Pt is leaving the ED due to the "ten-to-twelve-hour wait ahead" of her. She also noted that she would return if the pain worsened. This NT encouraged the pt to stay and informed the pt that the pt's time would reset if she left and later chose to return. The pt acknowledged this information and chose to leave.

## 2019-06-16 DEATH — deceased
